# Patient Record
Sex: Male | Born: 1975 | Race: Black or African American | Hispanic: No | Marital: Married | Smoking: Never smoker
Health system: Southern US, Community
[De-identification: ages and names within clinical notes are randomized; demographics above are authoritative.]

## PROBLEM LIST (undated history)

## (undated) DIAGNOSIS — I10 Essential (primary) hypertension: Secondary | ICD-10-CM

## (undated) DIAGNOSIS — J45909 Unspecified asthma, uncomplicated: Secondary | ICD-10-CM

## (undated) DIAGNOSIS — E785 Hyperlipidemia, unspecified: Secondary | ICD-10-CM

## (undated) DIAGNOSIS — I5022 Chronic systolic (congestive) heart failure: Secondary | ICD-10-CM

## (undated) DIAGNOSIS — N183 Chronic kidney disease, stage 3 unspecified: Secondary | ICD-10-CM

## (undated) DIAGNOSIS — R7303 Prediabetes: Secondary | ICD-10-CM

## (undated) DIAGNOSIS — J309 Allergic rhinitis, unspecified: Secondary | ICD-10-CM

## (undated) DIAGNOSIS — J42 Unspecified chronic bronchitis: Secondary | ICD-10-CM

## (undated) DIAGNOSIS — Z6841 Body Mass Index (BMI) 40.0 and over, adult: Secondary | ICD-10-CM

## (undated) DIAGNOSIS — Z9889 Other specified postprocedural states: Secondary | ICD-10-CM

## (undated) DIAGNOSIS — Z8619 Personal history of other infectious and parasitic diseases: Secondary | ICD-10-CM

## (undated) DIAGNOSIS — D509 Iron deficiency anemia, unspecified: Secondary | ICD-10-CM

## (undated) DIAGNOSIS — G4730 Sleep apnea, unspecified: Secondary | ICD-10-CM

## (undated) DIAGNOSIS — F419 Anxiety disorder, unspecified: Secondary | ICD-10-CM

## (undated) DIAGNOSIS — K611 Rectal abscess: Secondary | ICD-10-CM

## (undated) DIAGNOSIS — I428 Other cardiomyopathies: Secondary | ICD-10-CM

## (undated) DIAGNOSIS — E1110 Type 2 diabetes mellitus with ketoacidosis without coma: Secondary | ICD-10-CM

## (undated) HISTORY — DX: Iron deficiency anemia, unspecified: D50.9

## (undated) HISTORY — DX: Other cardiomyopathies: I42.8

## (undated) HISTORY — DX: Type 2 diabetes mellitus with ketoacidosis without coma: E11.10

## (undated) HISTORY — DX: Unspecified asthma, uncomplicated: J45.909

## (undated) HISTORY — DX: Personal history of other infectious and parasitic diseases: Z86.19

---

## 2013-09-21 HISTORY — PX: CARDIAC CATHETERIZATION: SHX172

## 2014-01-03 ENCOUNTER — Inpatient Hospital Stay (HOSPITAL_COMMUNITY)
Admission: EM | Admit: 2014-01-03 | Discharge: 2014-01-08 | DRG: 292 | Discharge status: Against Medical Advice | Payer: Medicare Other | Attending: Cardiology | Admitting: Cardiology

## 2014-01-03 ENCOUNTER — Emergency Department (HOSPITAL_COMMUNITY): Payer: Medicare Other

## 2014-01-03 ENCOUNTER — Encounter (HOSPITAL_COMMUNITY): Payer: Self-pay | Admitting: Emergency Medicine

## 2014-01-03 DIAGNOSIS — E785 Hyperlipidemia, unspecified: Secondary | ICD-10-CM | POA: Diagnosis present

## 2014-01-03 DIAGNOSIS — I10 Essential (primary) hypertension: Secondary | ICD-10-CM | POA: Diagnosis present

## 2014-01-03 DIAGNOSIS — Z7982 Long term (current) use of aspirin: Secondary | ICD-10-CM

## 2014-01-03 DIAGNOSIS — I509 Heart failure, unspecified: Secondary | ICD-10-CM

## 2014-01-03 DIAGNOSIS — Z9119 Patient's noncompliance with other medical treatment and regimen: Secondary | ICD-10-CM

## 2014-01-03 DIAGNOSIS — Z91199 Patient's noncompliance with other medical treatment and regimen due to unspecified reason: Secondary | ICD-10-CM

## 2014-01-03 DIAGNOSIS — I2789 Other specified pulmonary heart diseases: Secondary | ICD-10-CM | POA: Diagnosis present

## 2014-01-03 DIAGNOSIS — Z6841 Body Mass Index (BMI) 40.0 and over, adult: Secondary | ICD-10-CM

## 2014-01-03 DIAGNOSIS — I129 Hypertensive chronic kidney disease with stage 1 through stage 4 chronic kidney disease, or unspecified chronic kidney disease: Secondary | ICD-10-CM | POA: Diagnosis present

## 2014-01-03 DIAGNOSIS — I5023 Acute on chronic systolic (congestive) heart failure: Principal | ICD-10-CM | POA: Diagnosis present

## 2014-01-03 DIAGNOSIS — Z79899 Other long term (current) drug therapy: Secondary | ICD-10-CM

## 2014-01-03 DIAGNOSIS — IMO0001 Reserved for inherently not codable concepts without codable children: Secondary | ICD-10-CM

## 2014-01-03 DIAGNOSIS — J45909 Unspecified asthma, uncomplicated: Secondary | ICD-10-CM | POA: Diagnosis present

## 2014-01-03 DIAGNOSIS — N189 Chronic kidney disease, unspecified: Secondary | ICD-10-CM | POA: Diagnosis present

## 2014-01-03 DIAGNOSIS — E119 Type 2 diabetes mellitus without complications: Secondary | ICD-10-CM | POA: Diagnosis present

## 2014-01-03 DIAGNOSIS — N179 Acute kidney failure, unspecified: Secondary | ICD-10-CM | POA: Diagnosis not present

## 2014-01-03 DIAGNOSIS — E876 Hypokalemia: Secondary | ICD-10-CM | POA: Diagnosis present

## 2014-01-03 DIAGNOSIS — I428 Other cardiomyopathies: Secondary | ICD-10-CM | POA: Diagnosis present

## 2014-01-03 DIAGNOSIS — G4733 Obstructive sleep apnea (adult) (pediatric): Secondary | ICD-10-CM | POA: Diagnosis present

## 2014-01-03 DIAGNOSIS — Z0389 Encounter for observation for other suspected diseases and conditions ruled out: Secondary | ICD-10-CM

## 2014-01-03 DIAGNOSIS — G4730 Sleep apnea, unspecified: Secondary | ICD-10-CM | POA: Diagnosis present

## 2014-01-03 DIAGNOSIS — D509 Iron deficiency anemia, unspecified: Secondary | ICD-10-CM | POA: Diagnosis present

## 2014-01-03 HISTORY — DX: Body Mass Index (BMI) 40.0 and over, adult: Z684

## 2014-01-03 HISTORY — DX: Morbid (severe) obesity due to excess calories: E66.01

## 2014-01-03 HISTORY — DX: Sleep apnea, unspecified: G47.30

## 2014-01-03 HISTORY — DX: Hyperlipidemia, unspecified: E78.5

## 2014-01-03 LAB — URINALYSIS, ROUTINE W REFLEX MICROSCOPIC
Glucose, UA: NEGATIVE mg/dL
Hgb urine dipstick: NEGATIVE
Ketones, ur: NEGATIVE mg/dL
Leukocytes, UA: NEGATIVE
Nitrite: NEGATIVE
Protein, ur: 100 mg/dL — AB
Specific Gravity, Urine: 1.014 (ref 1.005–1.030)
Urobilinogen, UA: 1 mg/dL (ref 0.0–1.0)
pH: 5 (ref 5.0–8.0)

## 2014-01-03 LAB — CBC WITH DIFFERENTIAL/PLATELET
Basophils Absolute: 0 10*3/uL (ref 0.0–0.1)
Basophils Relative: 1 % (ref 0–1)
EOS ABS: 0.1 10*3/uL (ref 0.0–0.7)
EOS PCT: 1 % (ref 0–5)
HCT: 28.5 % — ABNORMAL LOW (ref 39.0–52.0)
Hemoglobin: 9.3 g/dL — ABNORMAL LOW (ref 13.0–17.0)
LYMPHS ABS: 0.9 10*3/uL (ref 0.7–4.0)
LYMPHS PCT: 16 % (ref 12–46)
MCH: 25.2 pg — ABNORMAL LOW (ref 26.0–34.0)
MCHC: 32.6 g/dL (ref 30.0–36.0)
MCV: 77.2 fL — AB (ref 78.0–100.0)
Monocytes Absolute: 0.4 10*3/uL (ref 0.1–1.0)
Monocytes Relative: 8 % (ref 3–12)
NEUTROS PCT: 75 % (ref 43–77)
Neutro Abs: 4.2 10*3/uL (ref 1.7–7.7)
PLATELETS: 303 10*3/uL (ref 150–400)
RBC: 3.69 MIL/uL — AB (ref 4.22–5.81)
RDW: 18.5 % — ABNORMAL HIGH (ref 11.5–15.5)
WBC: 5.6 10*3/uL (ref 4.0–10.5)

## 2014-01-03 LAB — URINE MICROSCOPIC-ADD ON

## 2014-01-03 LAB — POCT I-STAT TROPONIN I: TROPONIN I, POC: 0.02 ng/mL (ref 0.00–0.08)

## 2014-01-03 LAB — PRO B NATRIURETIC PEPTIDE: PRO B NATRI PEPTIDE: 1164 pg/mL — AB (ref 0–125)

## 2014-01-03 LAB — BASIC METABOLIC PANEL
BUN: 22 mg/dL (ref 6–23)
CALCIUM: 8.8 mg/dL (ref 8.4–10.5)
CHLORIDE: 90 meq/L — AB (ref 96–112)
CO2: 32 mEq/L (ref 19–32)
CREATININE: 1.34 mg/dL (ref 0.50–1.35)
GFR calc Af Amer: 78 mL/min — ABNORMAL LOW (ref >90)
GFR calc non Af Amer: 67 mL/min — ABNORMAL LOW (ref >90)
Glucose, Bld: 98 mg/dL (ref 70–99)
Potassium: 2.8 mEq/L — CL (ref 3.7–5.3)
SODIUM: 134 meq/L — AB (ref 137–147)

## 2014-01-03 MED ORDER — ATORVASTATIN CALCIUM 80 MG PO TABS
80.0000 mg | ORAL_TABLET | Freq: Every day | ORAL | Status: DC
  Administered 2014-01-04 – 2014-01-08 (×5): 80 mg via ORAL
  Filled 2014-01-03 (×6): qty 1

## 2014-01-03 MED ORDER — LISINOPRIL 5 MG PO TABS
5.0000 mg | ORAL_TABLET | Freq: Every day | ORAL | Status: DC
  Administered 2014-01-04: 5 mg via ORAL
  Filled 2014-01-03 (×2): qty 1

## 2014-01-03 MED ORDER — POTASSIUM CHLORIDE CRYS ER 20 MEQ PO TBCR
40.0000 meq | EXTENDED_RELEASE_TABLET | Freq: Once | ORAL | Status: AC
  Filled 2014-01-03: qty 2

## 2014-01-03 MED ORDER — ALBUTEROL SULFATE HFA 108 (90 BASE) MCG/ACT IN AERS: 2.0000 | INHALATION_SPRAY | Freq: Four times a day (QID) | RESPIRATORY_TRACT | Status: DC | PRN

## 2014-01-03 MED ORDER — ONDANSETRON HCL 4 MG/2ML IJ SOLN
4.0000 mg | Freq: Four times a day (QID) | INTRAMUSCULAR | Status: DC | PRN
  Administered 2014-01-03: 23:00:00 4 mg via INTRAVENOUS
  Filled 2014-01-03: qty 2

## 2014-01-03 MED ORDER — ACETAMINOPHEN 325 MG PO TABS
650.0000 mg | ORAL_TABLET | ORAL | Status: DC | PRN
  Administered 2014-01-05 (×2): 650 mg via ORAL
  Filled 2014-01-03 (×2): qty 2

## 2014-01-03 MED ORDER — NITROGLYCERIN 0.4 MG SL SUBL: 0.4000 mg | SUBLINGUAL_TABLET | SUBLINGUAL | Status: DC | PRN

## 2014-01-03 MED ORDER — ALBUTEROL SULFATE (2.5 MG/3ML) 0.083% IN NEBU
2.5000 mg | INHALATION_SOLUTION | Freq: Four times a day (QID) | RESPIRATORY_TRACT | Status: DC | PRN
  Administered 2014-01-03 – 2014-01-05 (×2): 2.5 mg via RESPIRATORY_TRACT
  Filled 2014-01-03 (×2): qty 3

## 2014-01-03 MED ORDER — FUROSEMIDE 10 MG/ML IJ SOLN
80.0000 mg | Freq: Once | INTRAMUSCULAR | Status: AC
  Administered 2014-01-03: 80 mg via INTRAVENOUS
  Filled 2014-01-03: qty 8

## 2014-01-03 MED ORDER — ISOSORBIDE MONONITRATE ER 60 MG PO TB24
90.0000 mg | ORAL_TABLET | Freq: Every day | ORAL | Status: DC
  Administered 2014-01-04: 11:00:00 90 mg via ORAL
  Filled 2014-01-03 (×2): qty 1

## 2014-01-03 MED ORDER — POTASSIUM CHLORIDE CRYS ER 20 MEQ PO TBCR
40.0000 meq | EXTENDED_RELEASE_TABLET | Freq: Two times a day (BID) | ORAL | Status: DC
  Administered 2014-01-04 – 2014-01-06 (×6): 40 meq via ORAL
  Administered 2014-01-07: 20 meq via ORAL
  Administered 2014-01-07 – 2014-01-08 (×2): 40 meq via ORAL
  Filled 2014-01-03 (×14): qty 2

## 2014-01-03 MED ORDER — FUROSEMIDE 10 MG/ML IJ SOLN
80.0000 mg | Freq: Two times a day (BID) | INTRAMUSCULAR | Status: DC
  Filled 2014-01-03 (×4): qty 8

## 2014-01-03 MED ORDER — POTASSIUM CHLORIDE CRYS ER 20 MEQ PO TBCR
40.0000 meq | EXTENDED_RELEASE_TABLET | Freq: Once | ORAL | Status: AC
  Administered 2014-01-03: 40 meq via ORAL
  Filled 2014-01-03: qty 2

## 2014-01-03 MED ORDER — ASPIRIN EC 81 MG PO TBEC
81.0000 mg | DELAYED_RELEASE_TABLET | Freq: Every day | ORAL | Status: DC
Start: 2014-01-04 — End: 2014-01-08
  Administered 2014-01-04 – 2014-01-08 (×5): 81 mg via ORAL
  Filled 2014-01-03 (×5): qty 1

## 2014-01-03 MED ORDER — HYDRALAZINE HCL 50 MG PO TABS
50.0000 mg | ORAL_TABLET | Freq: Three times a day (TID) | ORAL | Status: DC
  Administered 2014-01-05: 50 mg via ORAL
  Filled 2014-01-03 (×11): qty 1

## 2014-01-03 NOTE — ED Provider Notes (Signed)
CSN: [id number]     Arrival date & time 01/03/14  1210 History   First MD Initiated Contact with Patient 01/03/14 1221     Chief Complaint  Patient presents with  . Shortness of Breath  . Chest Pain   (Consider location/radiation/quality/duration/timing/severity/associated sxs/prior Treatment) HPI Comments: She presents to the ER for evaluation of difficulty breathing. Patient reports that he has a previous history of congestive heart failure. He reports that he has an enlarged heart. He just moved here from [place], has no local records her doctors. Patient reports that for the last 2 days he has noticed increased swelling of his legs, not well enough to his abdomen. He is feeling nausea and abdominal discomfort which she has had previously with fluid overload. Patient reports increased shortness of breath and dyspnea on exertion. He has been experiencing mild pressure in the center of his chest.  Patient is a 38 y.o. male presenting with shortness of breath and chest pain.  Shortness of Breath Associated symptoms: chest pain   Chest Pain Associated symptoms: shortness of breath     Past Medical History  Diagnosis Date  . CHF (congestive heart failure)    History reviewed. No pertinent past surgical history. No family history on file. History  Substance Use Topics  . Smoking status: Never Smoker   . Smokeless tobacco: Not on file  . Alcohol Use: No    Review of Systems  Respiratory: Positive for shortness of breath.   Cardiovascular: Positive for chest pain and leg swelling.  All other systems reviewed and are negative.    Allergies  Review of patient's allergies indicates no known allergies.  Home Medications  No current outpatient prescriptions on file. BP 109/79  Pulse 106  Temp(Src) 97.6 F (36.4 C) (Oral)  Resp 22  Ht 5\' 8"  (1.727 m)  Wt 360 lb (163.295 kg)  BMI 54.75 kg/m2  SpO2 98% Physical Exam  Constitutional: He is oriented to person, place, and  time. He appears well-developed and well-nourished. No distress.  HENT:  Head: Normocephalic and atraumatic.  Right Ear: Hearing normal.  Left Ear: Hearing normal.  Nose: Nose normal.  Mouth/Throat: Oropharynx is clear and moist and mucous membranes are normal.  Eyes: Conjunctivae and EOM are normal. Pupils are equal, round, and reactive to light.  Neck: Normal range of motion. Neck supple.  Cardiovascular: Regular rhythm, S1 normal and S2 normal.  Exam reveals no gallop and no friction rub.   No murmur heard. Pulmonary/Chest: Effort normal and breath sounds normal. No respiratory distress. He exhibits no tenderness.  Pitting edema to the abdomen  Abdominal: Soft. Normal appearance and bowel sounds are normal. There is no hepatosplenomegaly. There is no tenderness. There is no rebound, no guarding, no tenderness at McBurney's point and negative Murphy's sign. No hernia.  Musculoskeletal: Normal range of motion. He exhibits edema.  Neurological: He is alert and oriented to person, place, and time. He has normal strength. No cranial nerve deficit or sensory deficit. Coordination normal. GCS eye subscore is 4. GCS verbal subscore is 5. GCS motor subscore is 6.  Skin: Skin is warm, dry and intact. No rash noted. No cyanosis.  Psychiatric: He has a normal mood and affect. His speech is normal and behavior is normal. Thought content normal.    ED Course  Procedures (including critical care time)  Angiocath insertion Performed by: POLLINA, CHRISTOPHER J.  Consent: Verbal consent obtained. Risks and benefits: risks, benefits and alternatives were discussed Time out: Immediately  prior to procedure a "time out" was called to verify the correct patient, procedure, equipment, support staff and site/side marked as required.  Preparation: Patient was prepped and draped in the usual sterile fashion.  Vein Location: right antecub  Ultrasound Guided  Gauge: 20G  Normal blood return and flush  without difficulty Patient tolerance: Patient tolerated the procedure well with no immediate complications.    Labs Review Labs Reviewed  CBC WITH DIFFERENTIAL  PRO B NATRIURETIC PEPTIDE  BASIC METABOLIC PANEL   Imaging Review No results found.  EKG Interpretation    Date/Time:  Tuesday January 03 2014 12:17:14 EST Ventricular Rate:  109 PR Interval:  166 QRS Duration: 138 QT Interval:  368 QTC Calculation: 495 R Axis:   -110 Text Interpretation:  Sinus tachycardia Right superior axis deviation Non-specific intra-ventricular conduction block Cannot rule out Anterior infarct , age undetermined Abnormal ECG No previous tracing Confirmed by POLLINA  MD, CHRISTOPHER ([id number]) on 01/03/2014 12:37:12 PM            MDM  Diagnosis: Congestive heart failure  Patient presents to the ER for evaluation of swelling and difficulty breathing. Patient reports a history of congestive heart failure. Patient is from [place], only recently moved here. No records are available. Clear what the etiology of his congestive heart failure is. He does not think he has never had an MI. He does have a history of obstructive sleep apnea.  Workup today is consistent with decompensated congestive heart failure. Patient will require hospitalization and further evaluation. Cardiology contacted to evaluate the patient for admission.    Gilda Crease, MD 01/03/14 1540

## 2014-01-03 NOTE — ED Notes (Signed)
Pt reports that recently he and his family has become homeless. Pt has wife, 36 year old and 87 year old at bedside. States they are on a waiting list for a homeless shelter. Social work paged

## 2014-01-03 NOTE — Clinical Social Work Psychosocial (Signed)
   Clinical Social Work Department BRIEF PSYCHOSOCIAL ASSESSMENT 01/03/2014  Patient:  Steven Wyatt, Steven Wyatt     Account Number:  [id number]     Admit date:  01/03/2014  Clinical Social Worker:  Cristy Folks  Date/Time:  01/03/2014 03:07 PM  Referred by:  RN  Date Referred:  01/03/2014 Referred for  Homelessness   Other Referral:   Interview type:  Family Other interview type:    PSYCHOSOCIAL DATA Living Status:  FAMILY Admitted from facility:   Level of care:   Primary support name:  Steven Wyatt Primary support relationship to patient:  SPOUSE Degree of support available:   Good support    CURRENT CONCERNS Current Concerns  Financial Resources  Other - See comment   Other Concerns:   Homelessness and Transportation    SOCIAL WORK ASSESSMENT / PLAN CSW consult for homelessness. Pt and famly present to speak with CSW. Pt and family shared  that they moved to [place], [place] from [place], [place]. They have been here for 13 days. Before the family relocated they acquired a rental property but once they arrived here the land owner had already rented the property.  Pt and family moved into an Extended Stay where they spent all of their money on the room and food. The family was applying for emergency shelter when the pt began to have chest pain and the shelter worker advised pt to come to [facility] ED. Pt is married with 2 children, 12 and 4. The family does have a monthly of $1200 from SSI. The family is willing to relocate to [place] due to more resources and services. CSW gave family name and phone number to [facility] in [place] [phone]. This service will assist families with housing. Food vouchers given. Pt.'s wife stated that they will ask friend to transport to [place] tomorrow morning. It is the plan for pt. to be admitted for observation.   Assessment/plan status:  Psychosocial Support/Ongoing Assessment of Needs Other assessment/ plan:    Information/referral to community resources:   Homeless resources    PATIENT'S/FAMILY'S RESPONSE TO PLAN OF CARE: Pt and family appreciated CSW visit, resources and information. Pt and family understood the role and will follow up as necessary.   [address], [place] [phone]

## 2014-01-03 NOTE — ED Notes (Signed)
CRITICAL VALUE ALERT  Critical value received:  K 2.8  Date of notification:  01/03/2014  Time of notification:  1412  Critical value read back:Yes  Nurse who received alert:  Christin Bach   MD notified (1st page):  Dr. Blinda Leatherwood

## 2014-01-03 NOTE — ED Notes (Signed)
Contacted Flo Manager about bed placement. Per flo, a bed should be coming available shortly.

## 2014-01-03 NOTE — H&P (Signed)
Patient ID: Steven Wyatt MRN: [id number], DOB/AGE: Mar 26, 1976   Admit date: 01/03/2014   Primary Physician: No PCP Per Patient Primary Cardiologist: Dr Shirlee Latch (new)  HPI:   38 y/o with a history of NICM- just moved here 2 weeks ago from [place]. He has had prior cardiac evaluation there in including a cath at [facility] 12/21/12, and a cath at [facility] in Oct 2014 when he went there for a second opinion. He is married and has two children. 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 He and his family had arranged to move to [facility] but when they got here they place they were going to stay was unavailable. They have been staying in an extended stay hotel. They have used up the funds they had. They went to [employer] today and he bacame SOB. He admits he has had increasing SOB and increasing weight for the past few days. He has not been taking all his medications and they have been eating out since they have been here. He tells me his "dry weight" is around 345. He says he has been as high as 430. Today his wgt is 360.    Problem List: Past Medical History  Diagnosis Date  . CHF (congestive heart failure)   . Diabetes     "borderline:  . HTN (hypertension)   . Morbid obesity with BMI of 50.0-59.9, adult   . Sleep apnea     on Bi Pap  . Dyslipidemia     History reviewed. No pertinent past surgical history.   Allergies: No Known Allergies   Home Medications No current facility-administered medications for this encounter.   Current Outpatient Prescriptions  Medication Sig Dispense Refill  . albuterol (PROVENTIL HFA;VENTOLIN HFA) 108 (90 BASE) MCG/ACT inhaler Inhale 2 puffs into the lungs every 6 (six) hours as needed for wheezing or shortness of breath.      Marland Kitchen aspirin EC 81 MG tablet Take 81 mg by mouth daily.      Marland Kitchen atorvastatin (LIPITOR) 80 MG tablet Take 80 mg by mouth daily.      .  bumetanide (BUMEX) 2 MG tablet Take 2 mg by mouth daily.      . hydrALAZINE (APRESOLINE) 100 MG tablet Take 100 mg by mouth 3 (three) times daily.      . isosorbide mononitrate (IMDUR) 30 MG 24 hr tablet Take 90 mg by mouth daily.      Marland Kitchen lisinopril (PRINIVIL,ZESTRIL) 5 MG tablet Take 5 mg by mouth daily.      . metolazone (ZAROXOLYN) 5 MG tablet Take 5 mg by mouth daily as needed ("when Bumetanide isn't working").      . potassium chloride SA (K-DUR,KLOR-CON) 20 MEQ tablet Take 20 mEq by mouth daily.         No family history on file.   History   Social History  . Marital Status: Married    Spouse Name: N/A    Number of Children: N/A  . Years of Education: N/A   Occupational History  . Not on file.   Social History Main Topics  . Smoking status: Never Smoker   . Smokeless tobacco: Not on file  . Alcohol Use: No  . Drug Use: No  . Sexual Activity: Not on file   Other Topics Concern  . Not on file   Social History Narrative  . No narrative on file  Review of Systems: General: negative for chills, fever, night sweats or weight changes.  Dermatological: negative for rash Respiratory: negative for cough or wheezing Urologic: negative for hematuria Abdominal: negative for nausea, vomiting, diarrhea, bright red blood per rectum, melena, or hematemesis Neurologic: negative for visual changes, syncope, or dizziness All other systems reviewed and are otherwise negative except as noted above.  Physical Exam: Blood pressure 124/77, pulse 98, temperature 97.6 F (36.4 C), temperature source Oral, resp. rate 18, height 5\' 8"  (1.727 m), weight 360 lb (163.295 kg), SpO2 100.00%.  General: NAD, obese, sitting up Neck: Thick, JVP 12 cm, no thyromegaly or thyroid nodule.  Lungs: Somewhat decreased breath sounds bilateral bases.  CV: Nondisplaced PMI.  Heart regular S1/S2, no S3/S4, no murmur.  1+ chronic edema to knees bilaterally.  No carotid bruit.  Unable to palpate pedal  pulses due to chronic edema.   Abdomen: Soft, nontender, no hepatosplenomegaly, no distention.  Skin: Intact without lesions or rashes.  Neurologic: Alert and oriented x 3.  Psych: Normal affect. Extremities: No clubbing or cyanosis.  HEENT: Normal.    Labs:   Results for orders placed during the hospital encounter of 01/03/14 (from the past 24 hour(s))  CBC WITH DIFFERENTIAL     Status: Abnormal   Collection Time    01/03/14 12:47 PM      Result Value Range   WBC 5.6  4.0 - 10.5 K/uL   RBC 3.69 (*) 4.22 - 5.81 MIL/uL   Hemoglobin 9.3 (*) 13.0 - 17.0 g/dL   HCT 16.128.5 (*) 09.639.0 - 04.552.0 %   MCV 77.2 (*) 78.0 - 100.0 fL   MCH 25.2 (*) 26.0 - 34.0 pg   MCHC 32.6  30.0 - 36.0 g/dL   RDW 40.918.5 (*) 81.111.5 - 91.415.5 %   Platelets 303  150 - 400 K/uL   Neutrophils Relative % 75  43 - 77 %   Neutro Abs 4.2  1.7 - 7.7 K/uL   Lymphocytes Relative 16  12 - 46 %   Lymphs Abs 0.9  0.7 - 4.0 K/uL   Monocytes Relative 8  3 - 12 %   Monocytes Absolute 0.4  0.1 - 1.0 K/uL   Eosinophils Relative 1  0 - 5 %   Eosinophils Absolute 0.1  0.0 - 0.7 K/uL   Basophils Relative 1  0 - 1 %   Basophils Absolute 0.0  0.0 - 0.1 K/uL  BASIC METABOLIC PANEL     Status: Abnormal   Collection Time    01/03/14 12:47 PM      Result Value Range   Sodium 134 (*) 137 - 147 mEq/L   Potassium 2.8 (*) 3.7 - 5.3 mEq/L   Chloride 90 (*) 96 - 112 mEq/L   CO2 32  19 - 32 mEq/L   Glucose, Bld 98  70 - 99 mg/dL   BUN 22  6 - 23 mg/dL   Creatinine, Ser 7.821.34  0.50 - 1.35 mg/dL   Calcium 8.8  8.4 - 95.610.5 mg/dL   GFR calc non Af Amer 67 (*) >90 mL/min   GFR calc Af Amer 78 (*) >90 mL/min  PRO B NATRIURETIC PEPTIDE     Status: Abnormal   Collection Time    01/03/14 12:48 PM      Result Value Range   Pro B Natriuretic peptide (BNP) 1164.0 (*) 0 - 125 pg/mL  POCT I-STAT TROPONIN I     Status: None   Collection Time  01/03/14  1:20 PM      Result Value Range   Troponin i, poc 0.02  0.00 - 0.08 ng/mL   Comment 3               Radiology/Studies: Dg Chest 2 View  01/03/2014   CLINICAL DATA:  Shortness of Breath  EXAM: CHEST  2 VIEW  COMPARISON:  None.  FINDINGS: There is moderate diffuse cardiomegaly. There is mild pulmonary venous hypertension. There is slight interstitial edema. There is no airspace consolidation. No adenopathy. No bone lesions.  IMPRESSION: Evidence of a degree of congestive heart failure. No airspace consolidation.   Electronically Signed   By: Bretta Bang M.D.   On: 01/03/2014 13:51    EKG:NSR, NSIVCD, 1st degree AVB, poor ant RW  ASSESSMENT AND PLAN:  Principal Problem:   Acute on chronic systolic CHF (congestive heart failure) Active Problems:   NICM (nonischemic cardiomyopathy)   Morbid obesity with BMI of 50.0-59.9, adult   Sleep apnea- he reports comliance with Bi Pap   Normal coronary arteries- last cath Oct 2014 at [facility]   HTN (hypertension)   Dyslipidemia   PLAN: MD to see. Admit for IV diuretics, replace K+, check echo.    Deland Pretty, PA-C 01/03/2014, 4:15 PM  Patient seen with PA, agree with the above note.  1. Acute on chronic systolic CHF: Low EF on prior echoes per patient's report.  Nonischemic cardiomyopathy.  LHC x 2 (2013 and 2014) with normal coronaries per patient.  Cause of cardiomyopathy uncertain: does have long history of HTN.  He is very volume overloaded.  He recently moved down to [place] from [place] and has been eating out in restaurants for all meals.  Very high sodium diet.  He has been taking all his medications.  Metolazone is on his medication list but he does not take it.  - Lasix 80 mg IV bid, 1st dose now.  Replace K.  - Continue home dose of lisinopril and Imdur.  Would decrease hydralazine to 50 mg tid for now with soft BP, can increase back to home dose of 100 mg tid as long as BP remains stable.  - He has not been on a beta blocker (?due to history of asthma).  Once he is diuresed, would start on bisoprolol (beta-1  selective).   - Echocardiogram: eventually may be candidate for CRT-D (QRS 138 msec with IVCD that is LBBB-like).  2. OSA: Continue home bipap.   3. HTN: BP actually on the low side currently.   Marca Ancona 01/03/2014 4:23 PM

## 2014-01-03 NOTE — ED Notes (Signed)
Pt presents to department for evaluation of SOB and chest pain. Increasing SOB x2 days. History of CHF. Also states midsternal chest pressure. Speaking complete sentences upon arrival. Respirations unlabored. Skin warm and dry. Pt is alert and oriented x4.

## 2014-01-03 NOTE — ED Notes (Signed)
Cardiology at bedside.

## 2014-01-04 DIAGNOSIS — D509 Iron deficiency anemia, unspecified: Secondary | ICD-10-CM | POA: Diagnosis present

## 2014-01-04 DIAGNOSIS — I059 Rheumatic mitral valve disease, unspecified: Secondary | ICD-10-CM

## 2014-01-04 DIAGNOSIS — E876 Hypokalemia: Secondary | ICD-10-CM | POA: Diagnosis present

## 2014-01-04 LAB — BASIC METABOLIC PANEL
BUN: 26 mg/dL — ABNORMAL HIGH (ref 6–23)
BUN: 25 mg/dL — AB (ref 6–23)
CHLORIDE: 92 meq/L — AB (ref 96–112)
CO2: 24 mEq/L (ref 19–32)
CO2: 28 meq/L (ref 19–32)
Calcium: 8.7 mg/dL (ref 8.4–10.5)
Calcium: 8.4 mg/dL (ref 8.4–10.5)
Chloride: 90 mEq/L — ABNORMAL LOW (ref 96–112)
Creatinine, Ser: 1.42 mg/dL — ABNORMAL HIGH (ref 0.50–1.35)
Creatinine, Ser: 1.19 mg/dL (ref 0.50–1.35)
GFR calc Af Amer: 73 mL/min — ABNORMAL LOW (ref >90)
GFR calc Af Amer: >90 mL/min (ref >90)
GFR calc non Af Amer: 63 mL/min — ABNORMAL LOW (ref >90)
GFR calc non Af Amer: 78 mL/min — ABNORMAL LOW (ref >90)
GLUCOSE: 137 mg/dL — AB (ref 70–99)
Glucose, Bld: 112 mg/dL — ABNORMAL HIGH (ref 70–99)
Potassium: 3.9 mEq/L (ref 3.7–5.3)
Potassium: 3.7 mEq/L (ref 3.7–5.3)
Sodium: 134 mEq/L — ABNORMAL LOW (ref 137–147)
Sodium: 134 mEq/L — ABNORMAL LOW (ref 137–147)

## 2014-01-04 LAB — GLUCOSE, CAPILLARY
Glucose-Capillary: 121 mg/dL — ABNORMAL HIGH (ref 70–99)
Glucose-Capillary: 183 mg/dL — ABNORMAL HIGH (ref 70–99)

## 2014-01-04 LAB — MAGNESIUM: Magnesium: 1.6 mg/dL (ref 1.5–2.5)

## 2014-01-04 MED ORDER — FUROSEMIDE 10 MG/ML IJ SOLN
15.0000 mg/h | INTRAVENOUS | Status: DC
  Administered 2014-01-04: 10 mg/h via INTRAVENOUS
  Administered 2014-01-05 (×2): 15 mg/h via INTRAVENOUS
  Administered 2014-01-05: 10 mg/h via INTRAVENOUS
  Administered 2014-01-06 – 2014-01-07 (×2): 15 mg/h via INTRAVENOUS
  Filled 2014-01-04 (×11): qty 25

## 2014-01-04 MED ORDER — SPIRONOLACTONE 12.5 MG HALF TABLET
12.5000 mg | ORAL_TABLET | Freq: Every day | ORAL | Status: DC
  Administered 2014-01-05 – 2014-01-08 (×4): 12.5 mg via ORAL
  Filled 2014-01-04 (×6): qty 1

## 2014-01-04 MED ORDER — MAGNESIUM SULFATE 40 MG/ML IJ SOLN
2.0000 g | Freq: Once | INTRAMUSCULAR | Status: AC
  Administered 2014-01-04: 11:00:00 2 g via INTRAVENOUS
  Filled 2014-01-04: qty 50

## 2014-01-04 MED ORDER — FUROSEMIDE 10 MG/ML IJ SOLN: 80.0000 mg | Freq: Three times a day (TID) | INTRAMUSCULAR | Status: DC

## 2014-01-04 MED ORDER — HEPARIN SODIUM (PORCINE) 5000 UNIT/ML IJ SOLN
5000.0000 [IU] | Freq: Three times a day (TID) | INTRAMUSCULAR | Status: DC
  Administered 2014-01-05 – 2014-01-08 (×11): 5000 [IU] via SUBCUTANEOUS
  Filled 2014-01-04 (×15): qty 1

## 2014-01-04 MED ORDER — FUROSEMIDE 10 MG/ML IJ SOLN
40.0000 mg | Freq: Once | INTRAMUSCULAR | Status: AC
  Administered 2014-01-04: 40 mg via INTRAVENOUS

## 2014-01-04 NOTE — Progress Notes (Signed)
Pt a/o, no c/o pain, pt oob ad lib, c/o DOE, pt put on 2L for comfort, O2 sats WNL, pt started on lasix gtt @ 10 ml/hr, pt stable, vss

## 2014-01-04 NOTE — Progress Notes (Signed)
Patient ID: Steven Wyatt, male   DOB: [birth year], 38 y.o.   MRN: [id number]   SUBJECTIVE: Patient only got 1 dose IV Lasix in the ER yesterday.  Diuresis does not seem to have been particularly vigorous.    Marland Kitchen. aspirin EC  81 mg Oral Daily  . atorvastatin  80 mg Oral Daily  . furosemide  40 mg Intravenous Once  . hydrALAZINE  50 mg Oral Q8H  . isosorbide mononitrate  90 mg Oral Daily  . lisinopril  5 mg Oral Daily  . magnesium sulfate 1 - 4 g bolus IVPB  2 g Intravenous Once  . potassium chloride  40 mEq Oral BID  . spironolactone  12.5 mg Oral Daily      Filed Vitals:   01/03/14 2044 01/03/14 2052 01/04/14 0100 01/04/14 0435  BP:  132/86 114/61 110/85  Pulse:  110 105 99  Temp: 98 F (36.7 C) 98 F (36.7 C)  97.8 F (36.6 C)  TempSrc:  Oral  Oral  Resp:  18  20  Height:  5\' 8"  (1.727 m)    Weight:  181.303 kg (399 lb 11.2 oz)  180.4 kg (397 lb 11.4 oz)  SpO2: 99% 99%  100%    Intake/Output Summary (Last 24 hours) at 01/04/14 0813 Last data filed at 01/04/14 0437  Gross per 24 hour  Intake    480 ml  Output    625 ml  Net   -145 ml    LABS: Basic Metabolic Panel:  Recent Labs  40/98/1101/13/15 1247 01/04/14 0320  NA 134* 134*  K 2.8* 3.9  CL 90* 90*  CO2 32 24  GLUCOSE 98 112*  BUN 22 26*  CREATININE 1.34 1.42*  CALCIUM 8.8 8.7  MG  --  1.6   Liver Function Tests: No results found for this basename: AST, ALT, ALKPHOS, BILITOT, PROT, ALBUMIN,  in the last 72 hours No results found for this basename: LIPASE, AMYLASE,  in the last 72 hours CBC:  Recent Labs  01/03/14 1247  WBC 5.6  NEUTROABS 4.2  HGB 9.3*  HCT 28.5*  MCV 77.2*  PLT 303   Cardiac Enzymes: No results found for this basename: CKTOTAL, CKMB, CKMBINDEX, TROPONINI,  in the last 72 hours BNP: No components found with this basename: POCBNP,  D-Dimer: No results found for this basename: DDIMER,  in the last 72 hours Hemoglobin A1C: No results found for this basename: HGBA1C,  in the  last 72 hours Fasting Lipid Panel: No results found for this basename: CHOL, HDL, LDLCALC, TRIG, CHOLHDL, LDLDIRECT,  in the last 72 hours Thyroid Function Tests: No results found for this basename: TSH, T4TOTAL, FREET3, T3FREE, THYROIDAB,  in the last 72 hours Anemia Panel: No results found for this basename: VITAMINB12, FOLATE, FERRITIN, TIBC, IRON, RETICCTPCT,  in the last 72 hours  RADIOLOGY: Dg Chest 2 View  01/03/2014   CLINICAL DATA:  Shortness of Breath  EXAM: CHEST  2 VIEW  COMPARISON:  None.  FINDINGS: There is moderate diffuse cardiomegaly. There is mild pulmonary venous hypertension. There is slight interstitial edema. There is no airspace consolidation. No adenopathy. No bone lesions.  IMPRESSION: Evidence of a degree of congestive heart failure. No airspace consolidation.   Electronically Signed   By: Bretta BangWilliam  Woodruff M.D.   On: 01/03/2014 13:51    PHYSICAL EXAM General: NAD, obese, sitting up  Neck: Thick, JVP 12 cm, no thyromegaly or thyroid nodule.  Lungs: Somewhat decreased breath sounds bilateral bases.  CV: Nondisplaced PMI. Heart regular S1/S2, no S3/S4, no murmur. 1+ chronic edema to knees bilaterally. No carotid bruit. Unable to palpate pedal pulses due to chronic edema.  Abdomen: Soft, nontender, no hepatosplenomegaly, no distention.  Skin: Intact without lesions or rashes.  Neurologic: Alert and oriented x 3.  Psych: Normal affect.  Extremities: No clubbing or cyanosis.   TELEMETRY: Reviewed telemetry pt in NSR  ASSESSMENT AND PLAN: 38 yo with history of chronic systolic CHF (nonischemic CMP) and morbid obesity presented with acute on chronic systolic CHF.  1. Acute on chronic systolic CHF: Low EF on prior echoes per patient's report. Nonischemic cardiomyopathy. LHC x 2 (2013 and 2014) with normal coronaries per patient. Cause of cardiomyopathy uncertain: does have long history of HTN. He remains very volume overloaded. He recently moved down to [place] from  [place] and has been eating out in restaurants for all meals. Very high sodium diet. He has been taking all his medications. Metolazone is on his medication list but he does not take it.  - He tells me that Lasix gtt has worked best for diuresis on prior hospital admissions.  I will give him a bolus of Lasix 40 mg IV then start gtt at 10 mg/hr.  BMET to be repeated in pm.  - Continue current lisinopril, hydralazine/Imdur.  - Will add spironolactone 12.5 mg daily.   - He has not been on a beta blocker (?due to history of asthma). Once he is diuresed, would start on bisoprolol (beta-1 selective).  - Echocardiogram: eventually may be candidate for CRT-D (QRS 138 msec with IVCD that is LBBB-like).  2. OSA: Continue home bipap.  3. HTN: BP not elevated.   Marca Ancona 01/04/2014 8:17 AM

## 2014-01-04 NOTE — Progress Notes (Addendum)
Clinical Child psychotherapist (CSW) called on call chaplain to see if patient's family could get to [facility]. Chaplain reported that it could not be tonight but they would work on it first thing in the morning.   Jetta Lout, LCSWA Weekend CSW [phone]  CSW received call from [place] social worker at [employer]. Alinda Money is working on getting the family housing.  Tonya's number is [phone].  Jetta Lout, LCSWA Weekend CSW [phone]

## 2014-01-04 NOTE — Progress Notes (Signed)
  Echocardiogram 2D Echocardiogram has been performed.  Cathie Beams 01/04/2014, 9:51 AM

## 2014-01-04 NOTE — Care Management Note (Addendum)
  Page 1 of 1   01/04/2014     5:18:14 PM   CARE MANAGEMENT NOTE 01/04/2014  Patient:  Steven Wyatt, Steven Wyatt   Account Number:  [id number]  Date Initiated:  01/04/2014  Documentation initiated by:  Donato Schultz  Subjective/Objective Assessment:   Admitted with CHF     Action/Plan:   CM will monitor for dispositon needs   Anticipated DC Date:  01/07/2014   Anticipated DC Plan:    In-house referral  Clinical Social Worker         Choice offered to / List presented to:             Status of service:   Medicare Important Message given?   (If response is "NO", the following Medicare IM given date fields will be blank) Date Medicare IM given:   Date Additional Medicare IM given:    Discharge Disposition:  HOME/SELF CARE  Per UR Regulation:  Reviewed for med. necessity/level of care/duration of stay  If discussed at Long Length of Stay Meetings, dates discussed:    Comments:  01/04/2014 Last UR Date 01/04/2014 Social:  From DC: homeless in [place] with wife and children. (SW Referral) Disposition Plan:  Pending. Donato Schultz, RN, BSN, CCM 01/04/2014

## 2014-01-05 DIAGNOSIS — Z6841 Body Mass Index (BMI) 40.0 and over, adult: Secondary | ICD-10-CM

## 2014-01-05 LAB — CARBOXYHEMOGLOBIN
Carboxyhemoglobin: 1.4 % (ref 0.5–1.5)
Carboxyhemoglobin: 1.4 % (ref 0.5–1.5)
Methemoglobin: 1.6 % — ABNORMAL HIGH (ref 0.0–1.5)
Methemoglobin: 0.7 % (ref 0.0–1.5)
O2 Saturation: 34.6 %
O2 Saturation: 47.2 %
Total hemoglobin: 9.4 g/dL — ABNORMAL LOW (ref 13.5–18.0)
Total hemoglobin: 8.9 g/dL — ABNORMAL LOW (ref 13.5–18.0)

## 2014-01-05 LAB — BASIC METABOLIC PANEL
BUN: 29 mg/dL — ABNORMAL HIGH (ref 6–23)
CHLORIDE: 90 meq/L — AB (ref 96–112)
CO2: 24 meq/L (ref 19–32)
CREATININE: 1.43 mg/dL — AB (ref 0.50–1.35)
Calcium: 8.2 mg/dL — ABNORMAL LOW (ref 8.4–10.5)
GFR calc non Af Amer: 62 mL/min — ABNORMAL LOW (ref >90)
GFR, EST AFRICAN AMERICAN: 72 mL/min — AB (ref >90)
GLUCOSE: 161 mg/dL — AB (ref 70–99)
POTASSIUM: 3.6 meq/L — AB (ref 3.7–5.3)
Sodium: 133 mEq/L — ABNORMAL LOW (ref 137–147)

## 2014-01-05 LAB — MRSA PCR SCREENING: MRSA by PCR: NEGATIVE

## 2014-01-05 MED ORDER — SODIUM CHLORIDE 0.9 % IJ SOLN: 10.0000 mL | INTRAMUSCULAR | Status: DC | PRN

## 2014-01-05 MED ORDER — MILRINONE IN DEXTROSE 20 MG/100ML IV SOLN
0.1250 ug/kg/min | INTRAVENOUS | Status: DC
  Administered 2014-01-05 – 2014-01-06 (×3): 0.25 ug/kg/min via INTRAVENOUS
  Administered 2014-01-06 (×3): 0.375 ug/kg/min via INTRAVENOUS
  Administered 2014-01-06 – 2014-01-07 (×2): 0.25 ug/kg/min via INTRAVENOUS
  Administered 2014-01-07 (×2): 0.375 ug/kg/min via INTRAVENOUS
  Filled 2014-01-05 (×4): qty 100
  Filled 2014-01-05: qty 200
  Filled 2014-01-05 (×4): qty 100

## 2014-01-05 MED ORDER — TRAMADOL HCL 50 MG PO TABS
50.0000 mg | ORAL_TABLET | Freq: Once | ORAL | Status: AC
  Administered 2014-01-05: 12:00:00 50 mg via ORAL
  Filled 2014-01-05: qty 1

## 2014-01-05 MED ORDER — METOLAZONE 5 MG PO TABS
5.0000 mg | ORAL_TABLET | Freq: Once | ORAL | Status: AC
  Administered 2014-01-05: 09:00:00 5 mg via ORAL
  Filled 2014-01-05: qty 1

## 2014-01-05 MED ORDER — POTASSIUM CHLORIDE CRYS ER 20 MEQ PO TBCR
40.0000 meq | EXTENDED_RELEASE_TABLET | Freq: Once | ORAL | Status: AC
  Administered 2014-01-05: 09:00:00 40 meq via ORAL

## 2014-01-05 MED ORDER — LISINOPRIL 2.5 MG PO TABS
2.5000 mg | ORAL_TABLET | Freq: Every day | ORAL | Status: DC
Start: 2014-01-05 — End: 2014-01-06
  Administered 2014-01-05: 11:00:00 2.5 mg via ORAL
  Filled 2014-01-05 (×2): qty 1

## 2014-01-05 NOTE — Progress Notes (Signed)
Pt. Alert and oriented. C/o fullness and tightness to abdominal area. Pt. Requested PRN pain medication. Med administered as ordered. Pt. On IV lasix drip. Pt. Stated he feels he is not putting out as much urine as he should be on drip. Total amount of output charted. Pt. Bladder scanned and 122 mL of urine noted in bladder on scan. On call MD made aware. Orders received to in/out cath patient. Pt. Stated he would try to use the urinal instead. Pt. Able to urinate 200cc. RN will continue to monitor pt. For changes in condition. Konya Fauble, Cheryll Dessert

## 2014-01-05 NOTE — Progress Notes (Signed)
  CO-OX on Milrinone 0.25 mcg improved   34>47%  Continue current plan.   Rachelle Edwards NP-C 3:12 PM

## 2014-01-05 NOTE — Progress Notes (Signed)
Patient ID: Steven Wyatt, male   DOB: 09-27-1976, 38 y.o.   MRN: [id number]    SUBJECTIVE: Patient did not diurese vigorously on Lasix gtt yesterday.  No dyspnea at rest.   BP is soft.   Marland Kitchen aspirin EC  81 mg Oral Daily  . atorvastatin  80 mg Oral Daily  . heparin  5,000 Units Subcutaneous Q8H  . lisinopril  2.5 mg Oral Daily  . metolazone  5 mg Oral Once  . potassium chloride  40 mEq Oral BID  . potassium chloride  40 mEq Oral Once  . spironolactone  12.5 mg Oral Daily  Lasix gtt @ 10 mg/hr   Filed Vitals:   01/04/14 1945 01/04/14 2218 01/05/14 0203 01/05/14 0620  BP:  105/73 124/86 99/57  Pulse: 88 99 93 95  Temp:  97.3 F (36.3 C)  97.6 F (36.4 C)  TempSrc:  Oral  Oral  Resp: 20 20  22   Height:      Weight:    183.8 kg (405 lb 3.3 oz)  SpO2: 100% 100%  100%    Intake/Output Summary (Last 24 hours) at 01/05/14 0749 Last data filed at 01/05/14 6144  Gross per 24 hour  Intake    480 ml  Output   1575 ml  Net  -1095 ml    LABS: Basic Metabolic Panel:  Recent Labs  31/54/00 1247 01/04/14 0320 01/04/14 1545 01/05/14 0523  NA 134* 134* 134* 133*  K 2.8* 3.9 3.7 3.6*  CL 90* 90* 92* 90*  CO2 32 24 28 24   GLUCOSE 98 112* 137* 161*  BUN 22 26* 25* 29*  CREATININE 1.34 1.42* 1.19 1.43*  CALCIUM 8.8 8.7 8.4 8.2*  MG  --  1.6  --   --    Liver Function Tests: No results found for this basename: AST, ALT, ALKPHOS, BILITOT, PROT, ALBUMIN,  in the last 72 hours No results found for this basename: LIPASE, AMYLASE,  in the last 72 hours CBC:  Recent Labs  01/03/14 1247  WBC 5.6  NEUTROABS 4.2  HGB 9.3*  HCT 28.5*  MCV 77.2*  PLT 303   Cardiac Enzymes: No results found for this basename: CKTOTAL, CKMB, CKMBINDEX, TROPONINI,  in the last 72 hours BNP: No components found with this basename: POCBNP,  D-Dimer: No results found for this basename: DDIMER,  in the last 72 hours Hemoglobin A1C: No results found for this basename: HGBA1C,  in the last 72  hours Fasting Lipid Panel: No results found for this basename: CHOL, HDL, LDLCALC, TRIG, CHOLHDL, LDLDIRECT,  in the last 72 hours Thyroid Function Tests: No results found for this basename: TSH, T4TOTAL, FREET3, T3FREE, THYROIDAB,  in the last 72 hours Anemia Panel: No results found for this basename: VITAMINB12, FOLATE, FERRITIN, TIBC, IRON, RETICCTPCT,  in the last 72 hours  RADIOLOGY: Dg Chest 2 View  01/03/2014   CLINICAL DATA:  Shortness of Breath  EXAM: CHEST  2 VIEW  COMPARISON:  None.  FINDINGS: There is moderate diffuse cardiomegaly. There is mild pulmonary venous hypertension. There is slight interstitial edema. There is no airspace consolidation. No adenopathy. No bone lesions.  IMPRESSION: Evidence of a degree of congestive heart failure. No airspace consolidation.   Electronically Signed   By: Bretta Bang M.D.   On: 01/03/2014 13:51    PHYSICAL EXAM General: NAD, obese, sitting up  Neck: Thick, JVP 12 cm, no thyromegaly or thyroid nodule.  Lungs: Somewhat decreased breath sounds bilateral bases.  CV: Nondisplaced PMI. Heart regular S1/S2, no S3/S4, no murmur. 1+ chronic edema to knees bilaterally. No carotid bruit. Unable to palpate pedal pulses due to chronic edema.  Abdomen: Soft, nontender, no hepatosplenomegaly, no distention.  Skin: Intact without lesions or rashes.  Neurologic: Alert and oriented x 3.  Psych: Normal affect.  Extremities: No clubbing or cyanosis.   TELEMETRY: Reviewed telemetry pt in NSR  ASSESSMENT AND PLAN: 38 yo with history of chronic systolic CHF (nonischemic CMP) and morbid obesity presented with acute on chronic systolic CHF.  1. Acute on chronic systolic CHF: Echo yesterday with EF 20%, diffuse hypokinesis, RV moderately dysfunctional. Nonischemic cardiomyopathy. LHC x 2 (2013 and 2014) with normal coronaries per patient. Cause of cardiomyopathy uncertain: does have long history of HTN. He remains very volume overloaded. He recently moved  down to [place] from [place] and has been eating out in restaurants for all meals. Very high sodium diet. He has been taking all his medications.  - Soft BP and poor diuresis with Lasix gtt.  I am going to start him on milrinone @ 0.25 mcg/kg/min as I suspect low output is making diuresis difficult.  - Hold hydralazine/Imdur for now, decrease lisinopril to 2.5 daily. Can continue spironolactone.  - Increase Lasix gtt to 15 mg/hr and give metolazine 5 mg po x 1.    - He has not been on a beta blocker (?due to history of asthma). Once he is diuresed, would start on bisoprolol (beta-1 selective).  - Will place PICC.  -Eventually may be candidate for CRT-D (QRS 138 msec with IVCD that is LBBB-like).  2. OSA: Continue home bipap.  3. CKD: Creatinine essentially stable.  Suspect cardiorenal component.   Marca AnconaDalton Maxamillion Banas 01/05/2014 7:49 AM

## 2014-01-05 NOTE — Progress Notes (Signed)
Pt agreed to transfer to [facility] after talking with nurse about family being at bedside. Pt transferred by bed, NAD noted, able to communicate needs.

## 2014-01-05 NOTE — Progress Notes (Signed)
Peripherally Inserted Central Catheter/Midline Placement  The IV Nurse has discussed with the patient and/or persons authorized to consent for the patient, the purpose of this procedure and the potential benefits and risks involved with this procedure.  The benefits include less needle sticks, lab draws from the catheter and patient may be discharged home with the catheter.  Risks include, but not limited to, infection, bleeding, blood clot (thrombus formation), and puncture of an artery; nerve damage and irregular heat beat.  Alternatives to this procedure were also discussed.  PICC/Midline Placement Documentation        Vevelyn Pat 01/05/2014, 10:52 AM

## 2014-01-05 NOTE — Progress Notes (Signed)
Late Entry.  CSW and student- Donnella Sham met with patient this morning in attempt to provide psychosocial support and assistance with homelessness issues for his family.  Within a few minutes of talking to patient he became visibly agitated and began yelling. "I"ve already told my story to 3 other social workers, case Teaching laboratory technician and nurses- don't you people communicate?  I'm a Education officer, museum myself- an LCSW and so I know how things should be done." CSW explained that there were attempts being made to assist patient and his family with their homeless situation and that we simply needed to get an update and talk about possible options. CSW verbalized concerns that his children have been staying with patient and his wife in the hospital and that it was already explained to him that the hospital was asking that children not visit due to flu issues in the hospital. Again- patient responded angrily "I've already signed a waiver about this that says that I won't sue you if they get sick- this has already been dealt with- so why are you in here talking about it again. If my wife and kids cannot stay with me- just discharge me to the streets and my wife will sue all of you when I die!"  CSW attempted to provide calming support to patient without much success.  CSW left the room with promise to return soon and spoke with Scot Dock, Mudlogger of 3E and Freida Busman- Copy to update on above.  After multiple calls, CSW was able to obtain approval from Freda Munro in [facility] for patient's wife and 36 year old son to have a room at the [facility]. (Patient relates that his 64 year old son is now staying with his mother in [place]). This would not be a charge for this family and the room was approved for 1 week.   CSW met with patient and his wife today on 2 H after patient was moved to this unit and introduced him to the CSW covering this unit- Ky Barban. We discussed the [facility] arrangement and the  benefits of this option.  Patient was noted to be very calm during this visit and politely declined this offer stating that he felt that he needed his wife and son to remain close by. "We don't really know too many people here and it's a trust issue".  Patient and wife related that they had already discussed with patient's nurse that wife and son could sleep in the waiting room- thus being close by patient.  Mrs. Wolf stated that she had spent the morning at [facility] and working with Kenney Houseman with [employer] in an effort to make arrangements for a living situation. They are hopeful to have something worked out by either the end of the week of first of next week.  CSW strongly encouraged this family to reconsider [facility] but they defer at this time and want to see how the next few days progress with the they help of [employer].  Patient stated that they had been staying at the Extended Stay but their money has run out for rent or food.  Alden Server will continue to follow up with this family; Cleopatra Cedar, Surveyor, quantity of Social Work was also notified of above.  Lorie Phenix. [place], [place]

## 2014-01-05 NOTE — Progress Notes (Signed)
Chaplain followed up on referral from on call chaplain.  Chaplain observed pt on the phone.  Chaplain greeted pt and invited pt to let chaplain know if pt needs chaplain's assistance.  Chaplain will follow up as needed.   01/05/14 1300  Clinical Encounter Type  Visited With Patient and family together  Visit Type Spiritual support;Social support  Referral From [facility]  Spiritual Encounters  Spiritual Needs Emotional   Rulon Abide, chaplain, [phone]

## 2014-01-05 NOTE — Progress Notes (Signed)
Pt will place on machine when ready. NO distress noted at this time. Pt encouraged to call RT if needing any assistance.

## 2014-01-06 LAB — BASIC METABOLIC PANEL
BUN: 33 mg/dL — AB (ref 6–23)
CO2: 31 mEq/L (ref 19–32)
CREATININE: 1.55 mg/dL — AB (ref 0.50–1.35)
Calcium: 8.4 mg/dL (ref 8.4–10.5)
Chloride: 88 mEq/L — ABNORMAL LOW (ref 96–112)
GFR calc Af Amer: 65 mL/min — ABNORMAL LOW (ref >90)
GFR, EST NON AFRICAN AMERICAN: 56 mL/min — AB (ref >90)
Glucose, Bld: 129 mg/dL — ABNORMAL HIGH (ref 70–99)
Potassium: 3.6 mEq/L — ABNORMAL LOW (ref 3.7–5.3)
Sodium: 131 mEq/L — ABNORMAL LOW (ref 137–147)

## 2014-01-06 LAB — MAGNESIUM: Magnesium: 1.6 mg/dL (ref 1.5–2.5)

## 2014-01-06 LAB — CARBOXYHEMOGLOBIN
Carboxyhemoglobin: 1.5 % (ref 0.5–1.5)
Carboxyhemoglobin: 1.7 % — ABNORMAL HIGH (ref 0.5–1.5)
METHEMOGLOBIN: 0.9 % (ref 0.0–1.5)
Methemoglobin: 0.7 % (ref 0.0–1.5)
O2 SAT: 51.9 %
O2 Saturation: 67.3 %
TOTAL HEMOGLOBIN: 9.7 g/dL — AB (ref 13.5–18.0)
Total hemoglobin: 9.2 g/dL — ABNORMAL LOW (ref 13.5–18.0)

## 2014-01-06 MED ORDER — MAGNESIUM SULFATE 40 MG/ML IJ SOLN
2.0000 g | Freq: Once | INTRAMUSCULAR | Status: AC
  Administered 2014-01-06: 2 g via INTRAVENOUS
  Filled 2014-01-06: qty 50

## 2014-01-06 MED ORDER — SODIUM CHLORIDE 0.9 % IV SOLN
INTRAVENOUS | Status: DC
  Administered 2014-01-06: via INTRAVENOUS

## 2014-01-06 MED ORDER — POTASSIUM CHLORIDE CRYS ER 20 MEQ PO TBCR
40.0000 meq | EXTENDED_RELEASE_TABLET | Freq: Once | ORAL | Status: AC
  Administered 2014-01-06: 40 meq via ORAL

## 2014-01-06 NOTE — Progress Notes (Signed)
Pt. States he can place cpap on himself. RT informed pt. To notify if he needs any assistance. 

## 2014-01-06 NOTE — Progress Notes (Addendum)
CSW received call from pt requesting meal vouchers for tonight. CSW explained that we do not have the resources to continue providing these to family, that they got them the first two nights the pt has been in the hospital, and that CSW explained this yesterday. Pt asked to speak with CSW supervisor. CSW explained that she was walking by supervisor's office and would hand him the phone so pt could speak with him. Supervisor was not in his office, so CSW explained to pt that CSW would call her supervisor and let him know that pt would like to speak with supervisor. CSW called supervisor and left a voicemail explaining that pt wants to speak with him about meal vouchers, recounting CSW's conversation with pt.  Addendum (LATE ENTRY): CSW spoke with supervisor around 5:15pm, who explained that at CSW discretion a dinner voucher can be provided to family; however, we must ensure that the pt's family members have no other way of getting food as this is not a sustainable, long-term solution. Supervisor asked CSW to speak with pt's wife about the voucher and determine that she does not have alternative means of obtaining food for herself and their son. CSW called on-call CSW, as this CSW had left for the weekend, and explained this to on-call CSW. CSW asked on-call CSW to bring a voucher to pt's room and to further assess their progress on finding housing and a longer-term option for obtaining food. CSW will continue to follow case and provide support as needed and assistance to family.  Maryclare Labrador, MSW, [facility] Clinical Social Worker [phone]

## 2014-01-06 NOTE — Progress Notes (Signed)
LATE ENTRY  This CSW was introduced to pt and wife yesterday afternoon. Pt requested meal vouchers, and CSW explained that resources are not available to continue giving family meal vouchers and that they have received them for two nights previously. Pt and wife expressed understanding of this. Pt/family offered a stay at the [facility], as family recently moved here and do not have housing. Further, pt states he has a young son (approx. 18 years old) who he states must be in close proximity to him while he is in the hospital. Family declined [facility] offer. CSW asked if pt/family have additional questions for CSW, and they state they do not at this time. CSW continues to follow case.   Maryclare Labrador, MSW, [facility] Clinical Social Worker [phone]

## 2014-01-06 NOTE — Progress Notes (Addendum)
Patient ID: Huey RomansWayne O Wetherbee, male   DOB: [birth year], 38 y.o.   MRN: [id number]   SUBJECTIVE: Good diuresis yesterday after starting milrinone.  CVP still 24 this morning.   Marland Kitchen. aspirin EC  81 mg Oral Daily  . atorvastatin  80 mg Oral Daily  . heparin  5,000 Units Subcutaneous Q8H  . magnesium sulfate 1 - 4 g bolus IVPB  2 g Intravenous Once  . potassium chloride  40 mEq Oral BID  . potassium chloride  40 mEq Oral Once  . spironolactone  12.5 mg Oral Daily  Lasix gtt @ 15 mg/hr Milrinone gtt @ 0.25   Filed Vitals:   01/05/14 2200 01/05/14 2352 01/06/14 0000 01/06/14 0400  BP: 107/87  112/76 103/70  Pulse: 89  100   Temp:  98.2 F (36.8 C)  98.3 F (36.8 C)  TempSrc:  Oral  Oral  Resp: 16  24 16   Height:      Weight:    184 kg (405 lb 10.3 oz)  SpO2: 100%  100% 100%    Intake/Output Summary (Last 24 hours) at 01/06/14 0745 Last data filed at 01/06/14 0600  Gross per 24 hour  Intake 868.35 ml  Output   4720 ml  Net -3851.65 ml    LABS: Basic Metabolic Panel:  Recent Labs  40/98/1101/14/15 0320  01/05/14 0523 01/06/14 0345  NA 134*  < > 133* 131*  K 3.9  < > 3.6* 3.6*  CL 90*  < > 90* 88*  CO2 24  < > 24 31  GLUCOSE 112*  < > 161* 129*  BUN 26*  < > 29* 33*  CREATININE 1.42*  < > 1.43* 1.55*  CALCIUM 8.7  < > 8.2* 8.4  MG 1.6  --   --  1.6  < > = values in this interval not displayed. Liver Function Tests: No results found for this basename: AST, ALT, ALKPHOS, BILITOT, PROT, ALBUMIN,  in the last 72 hours No results found for this basename: LIPASE, AMYLASE,  in the last 72 hours CBC:  Recent Labs  01/03/14 1247  WBC 5.6  NEUTROABS 4.2  HGB 9.3*  HCT 28.5*  MCV 77.2*  PLT 303   Cardiac Enzymes: No results found for this basename: CKTOTAL, CKMB, CKMBINDEX, TROPONINI,  in the last 72 hours BNP: No components found with this basename: POCBNP,  D-Dimer: No results found for this basename: DDIMER,  in the last 72 hours Hemoglobin A1C: No results found for this  basename: HGBA1C,  in the last 72 hours Fasting Lipid Panel: No results found for this basename: CHOL, HDL, LDLCALC, TRIG, CHOLHDL, LDLDIRECT,  in the last 72 hours Thyroid Function Tests: No results found for this basename: TSH, T4TOTAL, FREET3, T3FREE, THYROIDAB,  in the last 72 hours Anemia Panel: No results found for this basename: VITAMINB12, FOLATE, FERRITIN, TIBC, IRON, RETICCTPCT,  in the last 72 hours  RADIOLOGY: Dg Chest 2 View  01/03/2014   CLINICAL DATA:  Shortness of Breath  EXAM: CHEST  2 VIEW  COMPARISON:  None.  FINDINGS: There is moderate diffuse cardiomegaly. There is mild pulmonary venous hypertension. There is slight interstitial edema. There is no airspace consolidation. No adenopathy. No bone lesions.  IMPRESSION: Evidence of a degree of congestive heart failure. No airspace consolidation.   Electronically Signed   By: Bretta BangWilliam  Woodruff M.D.   On: 01/03/2014 13:51    PHYSICAL EXAM General: NAD, obese, sitting up  Neck: Thick, JVP 12+ cm, no thyromegaly  or thyroid nodule.  Lungs: Somewhat decreased breath sounds bilateral bases.  CV: Nondisplaced PMI. Heart regular S1/S2, no S3/S4, no murmur. 1+ chronic edema to knees bilaterally. No carotid bruit. Unable to palpate pedal pulses due to chronic edema.  Abdomen: Soft, nontender, no hepatosplenomegaly, no distention.  Skin: Intact without lesions or rashes.  Neurologic: Alert and oriented x 3.  Psych: Normal affect.  Extremities: No clubbing or cyanosis.   TELEMETRY: Reviewed telemetry pt in NSR  ASSESSMENT AND PLAN: 38 yo with history of chronic systolic CHF (nonischemic CMP) and morbid obesity presented with acute on chronic systolic CHF.  1. Acute on chronic systolic CHF: Echo with EF 20%, diffuse hypokinesis, RV moderately dysfunctional. Nonischemic cardiomyopathy. LHC x 2 (2013 and 2014) with normal coronaries per patient. Cause of cardiomyopathy uncertain: does have long history of HTN. He remains very volume  overloaded. He recently moved down to [place] from [place] and had been eating out in restaurants for all meals. Very high sodium diet. He had been taking all his medications.  - Diuresis better with initiation of milrinone. Co-ox still low at 52%.  Will increase milrinone to 0.375 this morning and repeat co-ox.  - Hold hydralazine/Imdur and lisinopril for now with BP on the lower side. Can continue spironolactone.  - Continue Lasix gtt @ 15 mg/hr, good diuresis yesterday.  CVP remains high at 24.    - Eventually may be candidate for CRT-D (QRS 138 msec with IVCD that is LBBB-like).  2. OSA: Continue home bipap.  3. CKD: Mild rise in creatinine, hopefully will improve with milrinone. Suspect cardiorenal component.   Marca Ancona 01/06/2014 7:45 AM   Repeat co-ox 67%.  He seems to be diuresing well.  Continue current regimen.  He did not get metolazone today as he seems to be diuresing well without.  Can give dose tomorrow if UOP not adequate.  Marca Ancona 01/06/2014 5:10 PM

## 2014-01-06 NOTE — Progress Notes (Signed)
Chaplain responded to referral concerning food vouchers. However, when chaplain entered and asked the family about their needs, they said they were doing fine. Chaplain will follow up as necessary.

## 2014-01-07 ENCOUNTER — Encounter (HOSPITAL_COMMUNITY): Payer: Self-pay

## 2014-01-07 DIAGNOSIS — I428 Other cardiomyopathies: Secondary | ICD-10-CM

## 2014-01-07 LAB — CARBOXYHEMOGLOBIN
CARBOXYHEMOGLOBIN: 1.7 % — AB (ref 0.5–1.5)
METHEMOGLOBIN: 0.6 % (ref 0.0–1.5)
O2 Saturation: 61.6 %
Total hemoglobin: 9.3 g/dL — ABNORMAL LOW (ref 13.5–18.0)

## 2014-01-07 MED ORDER — BUMETANIDE 2 MG PO TABS
4.0000 mg | ORAL_TABLET | Freq: Every day | ORAL | Status: DC
  Administered 2014-01-07 – 2014-01-08 (×2): 4 mg via ORAL
  Filled 2014-01-07 (×3): qty 2

## 2014-01-07 NOTE — Progress Notes (Signed)
Pt. Wearing cpap. With 3L 02 bleed in. Tolerating well.

## 2014-01-07 NOTE — Progress Notes (Signed)
Patient found laying in bed eating family size bag of salted potato chips. RN attempted to educate patient on compliance and effects of chips on heart failure and diuresing. Patient stated chips and salt have no effect on heart failure and became very agitated and verbally hostile. Patient stated "this has been a beautiful stay with my family until now. I know my body and my heart is just fine". Patient unwilling to have further conversation/education r/t heart failure. Will notify MD upon rounds r/t noncompliance and lack of willingness to be educated.

## 2014-01-07 NOTE — Progress Notes (Signed)
  Subjective:  Substantially improved after prodigious diuresis. States that as long as he can have access to meds his CHF is always well controlled. Insists that he has to leave soon to sign a lease, as his current lack of a home is the actual cause of his decompensation. Clearly noncompliant with dietary restrictions - eating chips and cheese burger in the room, refusing hospital tray, despite our staff's admonitions.  Objective:  Temp:  [97.3 F (36.3 C)-98.9 F (37.2 C)] 98.5 F (36.9 C) (01/17 1550) Pulse Rate:  [101-116] 116 (01/17 1200) Resp:  [18-26] 20 (01/17 1550) BP: (93-130)/(60-89) 120/60 mmHg (01/17 1550) SpO2:  [87 %-100 %] 90 % (01/17 1550) Weight:  [179 kg (394 lb 10 oz)] 179 kg (394 lb 10 oz) (01/17 0500) Weight change: -5 kg (-11 lb 0.4 oz)  Intake/Output from previous day: 01/16 0701 - 01/17 0700 In: 1764.2 [P.O.:720; I.V.:1044.2] Out: 7730 [Urine:7730]  Intake/Output from this shift: Total I/O In: 1627.4 [P.O.:1200; I.V.:427.4] Out: 2850 [Urine:2850]  Medications: Current Facility-Administered Medications  Medication Dose Route Frequency Provider Last Rate Last Dose  . 0.9 %  sodium chloride infusion   Intravenous Continuous Dalton S McLean, MD 10 mL/hr at 01/06/14 0009    . acetaminophen (TYLENOL) tablet 650 mg  650 mg Oral Q4H PRN Luke K Kilroy, PA-C   650 mg at 01/05/14 1114  . albuterol (PROVENTIL) (2.5 MG/3ML) 0.083% nebulizer solution 2.5 mg  2.5 mg Nebulization Q6H PRN Dalton S McLean, MD   2.5 mg at 01/05/14 0831  . aspirin EC tablet 81 mg  81 mg Oral Daily Luke K Kilroy, PA-C   81 mg at 01/07/14 0927  . atorvastatin (LIPITOR) tablet 80 mg  80 mg Oral Daily Luke K Kilroy, PA-C   80 mg at 01/07/14 1116  . heparin injection 5,000 Units  5,000 Units Subcutaneous Q8H Dalton S McLean, MD   5,000 Units at 01/07/14 1406  . milrinone (PRIMACOR) infusion 200 mcg/mL (0.2 mg/ml)  0.125 mcg/kg/min Intravenous Continuous Mihai Croitoru, MD 6.9 mL/hr at  01/07/14 1838 0.125 mcg/kg/min at 01/07/14 1838  . nitroGLYCERIN (NITROSTAT) SL tablet 0.4 mg  0.4 mg Sublingual Q5 min PRN Luke K Kilroy, PA-C      . ondansetron (ZOFRAN) injection 4 mg  4 mg Intravenous Q6H PRN Luke K Kilroy, PA-C   4 mg at 01/03/14 2255  . potassium chloride SA (K-DUR,KLOR-CON) CR tablet 40 mEq  40 mEq Oral BID Luke K Kilroy, PA-C   20 mEq at 01/07/14 0927  . sodium chloride 0.9 % injection 10-40 mL  10-40 mL Intracatheter PRN Dalton S McLean, MD      . spironolactone (ALDACTONE) tablet 12.5 mg  12.5 mg Oral Daily Dalton S McLean, MD   12.5 mg at 01/07/14 0927    Physical Exam: General appearance: alert, cooperative, no distress and morbidly obese Neck: no adenopathy, no carotid bruit, supple, symmetrical, trachea midline and cannot see JVP Lungs: clear to auscultation bilaterally Heart: regular rate and rhythm, S1, S2 normal and frequent ectopy Abdomen: soft, non-tender; bowel sounds normal; no masses,  no organomegaly Extremities: edema 2+ Pulses: 2+ and symmetric Skin: Skin color, texture, turgor normal. No rashes or lesions Neurologic: Grossly normal  Lab Results: Results for orders placed during the hospital encounter of 01/03/14 (from the past 48 hour(s))  MAGNESIUM     Status: None   Collection Time    01/06/14  3:45 AM      Result Value Range   Magnesium   1.6  1.5 - 2.5 mg/dL  BASIC METABOLIC PANEL     Status: Abnormal   Collection Time    01/06/14  3:45 AM      Result Value Range   Sodium 131 (*) 137 - 147 mEq/L   Potassium 3.6 (*) 3.7 - 5.3 mEq/L   Chloride 88 (*) 96 - 112 mEq/L   CO2 31  19 - 32 mEq/L   Glucose, Bld 129 (*) 70 - 99 mg/dL   BUN 33 (*) 6 - 23 mg/dL   Creatinine, Ser 1.55 (*) 0.50 - 1.35 mg/dL   Calcium 8.4  8.4 - 10.5 mg/dL   GFR calc non Af Amer 56 (*) >90 mL/min   GFR calc Af Amer 65 (*) >90 mL/min   Comment: (NOTE)     The eGFR has been calculated using the CKD EPI equation.     This calculation has not been validated in all  clinical situations.     eGFR's persistently <90 mL/min signify possible Chronic Kidney     Disease.  CARBOXYHEMOGLOBIN     Status: Abnormal   Collection Time    01/06/14  3:50 AM      Result Value Range   Total hemoglobin 9.2 (*) 13.5 - 18.0 g/dL   O2 Saturation 51.9     Carboxyhemoglobin 1.5  0.5 - 1.5 %   Methemoglobin 0.9  0.0 - 1.5 %  CARBOXYHEMOGLOBIN     Status: Abnormal   Collection Time    01/06/14 12:00 PM      Result Value Range   Total hemoglobin 9.7 (*) 13.5 - 18.0 g/dL   O2 Saturation 67.3     Carboxyhemoglobin 1.7 (*) 0.5 - 1.5 %   Methemoglobin 0.7  0.0 - 1.5 %  CARBOXYHEMOGLOBIN     Status: Abnormal   Collection Time    01/07/14  3:47 AM      Result Value Range   Total hemoglobin 9.3 (*) 13.5 - 18.0 g/dL   O2 Saturation 61.6     Carboxyhemoglobin 1.7 (*) 0.5 - 1.5 %   Methemoglobin 0.6  0.0 - 1.5 %    Imaging: Imaging results have been reviewed and No results found.  Assessment:  1. Principal Problem: 2.   Acute on chronic systolic CHF (congestive heart failure) 3. Active Problems: 4.   NICM (nonischemic cardiomyopathy) 5.   Normal coronary arteries- last cath Oct 2014 at [place] 6.   Morbid obesity with BMI of 50.0-59.9, adult 7.   Sleep apnea- he reports comliance with Bi Pap 8.   HTN (hypertension) 9.   Dyslipidemia 10.   Asthma 11.   Microcytic anemia 12.   Hypokalemia 13.   Plan:  1. Improving acute on chronic cardiac failure  - wean off inotropes and switch to oral diuretics 2. He would benefit from another 48 hours of transition to PO meds but he insists on leaving tomorrow, even if against medical advice 3. recheck labs in AM 4. Will not readily accept advice re: low sodium diet -"he knows his body better than anybody else)  Time Spent Directly with Patient:  45 minutes  Length of Stay:  LOS: 4 days    CROITORU,MIHAI 01/07/2014, 6:41 PM     

## 2014-01-07 NOTE — Plan of Care (Signed)
Problem: Phase II Progression Outcomes Goal: Begin discharge teaching Outcome: Not Progressing Patient observed being noncompliant with diet. RN attempted to educate patient on the importance of a low sodium, heart healthy diet and patient became verbally hostile stating "Salt doesn't affect my heart. My heart is just fine"

## 2014-01-07 NOTE — Progress Notes (Signed)
RN received call from Dietary supervisor. Patient has been calling Service Response requesting food from the cafeteria for himself and wife and child. Patient to be receiving prescribed diet, and wife and child to receive patient trays from cafeteria. RN will attempt to discuss diet orders and curtisy tray with patient and request that he not continue to call service response for multiple orders.

## 2014-01-08 LAB — BASIC METABOLIC PANEL
BUN: 33 mg/dL — AB (ref 6–23)
CO2: 34 mEq/L — ABNORMAL HIGH (ref 19–32)
CREATININE: 1.56 mg/dL — AB (ref 0.50–1.35)
Calcium: 9.1 mg/dL (ref 8.4–10.5)
Chloride: 89 mEq/L — ABNORMAL LOW (ref 96–112)
GFR, EST AFRICAN AMERICAN: 65 mL/min — AB (ref >90)
GFR, EST NON AFRICAN AMERICAN: 56 mL/min — AB (ref >90)
Glucose, Bld: 109 mg/dL — ABNORMAL HIGH (ref 70–99)
Potassium: 4 mEq/L (ref 3.7–5.3)
Sodium: 136 mEq/L — ABNORMAL LOW (ref 137–147)

## 2014-01-08 LAB — CARBOXYHEMOGLOBIN
CARBOXYHEMOGLOBIN: 1.5 % (ref 0.5–1.5)
METHEMOGLOBIN: 1.6 % — AB (ref 0.0–1.5)
O2 Saturation: 35.3 %
Total hemoglobin: 9.6 g/dL — ABNORMAL LOW (ref 13.5–18.0)

## 2014-01-08 LAB — MAGNESIUM: Magnesium: 1.7 mg/dL (ref 1.5–2.5)

## 2014-01-08 MED ORDER — LORAZEPAM 2 MG/ML IJ SOLN
1.0000 mg | Freq: Once | INTRAMUSCULAR | Status: AC
Start: 2014-01-08 — End: 2014-01-08
  Administered 2014-01-08: 1 mg via INTRAVENOUS
  Filled 2014-01-08: qty 1

## 2014-01-08 MED ORDER — DIPHENHYDRAMINE HCL 25 MG PO CAPS
50.0000 mg | ORAL_CAPSULE | Freq: Once | ORAL | Status: AC
  Administered 2014-01-08: 50 mg via ORAL
  Filled 2014-01-08: qty 2

## 2014-01-08 MED ORDER — ALBUTEROL SULFATE HFA 108 (90 BASE) MCG/ACT IN AERS: 2.0000 | INHALATION_SPRAY | Freq: Four times a day (QID) | RESPIRATORY_TRACT | Status: DC | PRN

## 2014-01-08 MED ORDER — HYDRALAZINE HCL 10 MG PO TABS: 25.0000 mg | ORAL_TABLET | Freq: Three times a day (TID) | ORAL | Status: DC

## 2014-01-08 MED ORDER — ATORVASTATIN CALCIUM 80 MG PO TABS: 80.0000 mg | ORAL_TABLET | Freq: Every day | ORAL | Status: DC

## 2014-01-08 MED ORDER — BUMETANIDE 2 MG PO TABS: 4.0000 mg | ORAL_TABLET | Freq: Every day | ORAL | Status: DC

## 2014-01-08 MED ORDER — ISOSORBIDE MONONITRATE ER 30 MG PO TB24: 90.0000 mg | ORAL_TABLET | Freq: Every day | ORAL | Status: DC

## 2014-01-08 NOTE — Progress Notes (Signed)
I agree with the assessment documented by Mavis, student nurse. 

## 2014-01-08 NOTE — Discharge Summary (Signed)
Physician Discharge Summary  Patient ID: Steven Wyatt MRN: [id number] DOB/AGE: 1976/04/18 37 y.o. Cardiologist: Shirlee Latch  Admit date: 01/03/2014 Discharge date: 01/08/2014  Admission Diagnoses:    Acute on chronic systolic CHF (congestive heart failure)  Discharge Diagnoses:  Principal Problem:   Acute on chronic systolic CHF (congestive heart failure) Active Problems:   NICM (nonischemic cardiomyopathy)   Normal coronary arteries- last cath Oct 2014 at [facility]   Morbid obesity with BMI of 50.0-59.9, adult   Sleep apnea- he reports comliance with Bi Pap   HTN (hypertension)   Dyslipidemia   Asthma   Microcytic anemia   Hypokalemia   Acute renal failure.    Discharged Condition: stable  Hospital Course:  38 y/o with a history of NICM- just moved here 2 weeks ago from [place]. He has had prior cardiac evaluation there including a cath at [facility] 12/21/12, and a cath at [facility] in Oct 2014 when he went there for a second opinion. He is married and has two children. 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 He and his family had arranged to move to [facility] but when they got here they place they were going to stay was unavailable. They have been staying in an extended stay hotel. They have used up the funds they had. They went to [employer] today and he bacame SOB. He admits he has had increasing SOB and increasing weight for the past few days. He has not been taking all his medications and they have been eating out since they have been here. He tells me his "dry weight" is around 345. He says he has been as high as 430. Today his wgt is 360.   The patient was admitted for IV diuresis with lasix gtt which resulted in -13.2L of fluid loss.  He was also started on milrinone.  Home bipap used.  His weight decreased from 405 to 368.  He says this is his dry weight, however, he reported it at  admission as 345#.Marland Kitchen  The patient has been very noncompliant and was found eating a large family size bag of salted potato chips in his room.  The patient stated,"Salt doesn't affect my heart. My heart is just fine."  He became verbally abusive to the nurse when she tried to educate him.   2D echo revealed EF of 20%, diffuse hypokinesis, grade 2 diastolic dysfunction, mod pulm HTN, mod to sev LAE.  He was weaned of inotropes and changed to PO diuretics.   SCr increased to 1.55/56.  He still needs further observation and monitored diuretic therapy, however, the patient said he is leaving against medical advise.  The patient was seen by Dr. Royann Shivers who felt he was NOT ready for DC home.      Consults: None  Significant Diagnostic Studies:   Echo Study Conclusions  - Left ventricle: The cavity size was moderately dilated. Wall thickness was normal. The estimated ejection fraction was 20%. Diffuse hypokinesis. Features are consistent with a pseudonormal left ventricular filling pattern, with concomitant abnormal relaxation and increased filling pressure (grade 2 diastolic dysfunction). - Aortic valve: There was no stenosis. - Mitral valve: Mildly calcified annulus. Mildly calcified leaflets . Mild regurgitation. - Left atrium: The atrium was moderately to severely dilated. - Right ventricle: The cavity size was moderately dilated. Systolic function was moderately reduced. - Tricuspid valve: Peak RV-RA gradient: 39mm Hg (S). - Pulmonary arteries:  PA peak pressure: 2mm Hg (S). - Systemic veins: IVC measured 3.2 cm with minimal respirophasic variation, suggesting RA pressure 20 mmHg. Impressions:  - Moderately dilated LV with severe global hypokinesis, EF 20%. Moderate diastolic dysfunction. Moderately dilated RV with moderately decreased systolic function. Mild MR. Moderate pulmonary hypertension. Moderate to severe LAE.  01/03/14 EXAM: CHEST 2 VIEW  COMPARISON:  None.  FINDINGS: There is moderate diffuse cardiomegaly. There is mild pulmonary venous hypertension. There is slight interstitial edema. There is no airspace consolidation. No adenopathy. No bone lesions.  IMPRESSION: Evidence of a degree of congestive heart failure. No airspace consolidation.   Electronically Signed By: Bretta Bang M.D. On: 01/03/2014 13:51    CBC    Component Value Date/Time   WBC 5.6 01/03/2014 1247   RBC 3.69* 01/03/2014 1247   HGB 9.3* 01/03/2014 1247   HCT 28.5* 01/03/2014 1247   PLT 303 01/03/2014 1247   MCV 77.2* 01/03/2014 1247   MCH 25.2* 01/03/2014 1247   MCHC 32.6 01/03/2014 1247   RDW 18.5* 01/03/2014 1247   LYMPHSABS 0.9 01/03/2014 1247   MONOABS 0.4 01/03/2014 1247   EOSABS 0.1 01/03/2014 1247   BASOSABS 0.0 01/03/2014 1247    BMET    Component Value Date/Time   NA 136* 01/08/2014 0500   K 4.0 01/08/2014 0500   CL 89* 01/08/2014 0500   CO2 34* 01/08/2014 0500   GLUCOSE 109* 01/08/2014 0500   BUN 33* 01/08/2014 0500   CREATININE 1.56* 01/08/2014 0500   CALCIUM 9.1 01/08/2014 0500   GFRNONAA 56* 01/08/2014 0500   GFRAA 65* 01/08/2014 0500      Treatments: See above  Discharge Exam: Blood pressure 103/41, pulse 110, temperature 98.1 F (36.7 C), temperature source Oral, resp. rate 18, height 5\' 8"  (1.727 m), weight 368 lb 13.3 oz (167.3 kg), SpO2 97.00%.   Disposition: Final discharge disposition not confirmed      Discharge Orders   Future Orders Complete By Expires   Diet - low sodium heart healthy  As directed    Discharge instructions  As directed    Comments:     Call the office if you gain 2 pound in 24hrs or 5 pounds in a week.   Increase activity slowly  As directed        Medication List    STOP taking these medications       lisinopril 5 MG tablet  Commonly known as:  PRINIVIL,ZESTRIL      TAKE these medications       albuterol 108 (90 BASE) MCG/ACT inhaler  Commonly known as:  PROVENTIL HFA;VENTOLIN HFA   Inhale 2 puffs into the lungs every 6 (six) hours as needed for wheezing or shortness of breath.     aspirin EC 81 MG tablet  Take 81 mg by mouth daily.     atorvastatin 80 MG tablet  Commonly known as:  LIPITOR  Take 80 mg by mouth daily.     bumetanide 2 MG tablet  Commonly known as:  BUMEX  Take 2 tablets (4 mg total) by mouth daily with breakfast.     hydrALAZINE 10 MG tablet  Commonly known as:  APRESOLINE  Take 2.5 tablets (25 mg total) by mouth 3 (three) times daily.     isosorbide mononitrate 30 MG 24 hr tablet  Commonly known as:  IMDUR  Take 90 mg by mouth daily.     metolazone 5 MG tablet  Commonly known as:  ZAROXOLYN  Take 5 mg  by mouth daily as needed ("when Bumetanide isn't working").     potassium chloride SA 20 MEQ tablet  Commonly known as:  K-DUR,KLOR-CON  Take 20 mEq by mouth daily.       Follow-up Information   Follow up with Marca Anconaalton McLean, MD. (The office will call you regarding the follow up appt. date and time.)    Specialty:  Cardiology   Contact information:   [address] [place] [id number] [phone]       Signed: Wilburt FinlayHAGER, BRYAN 01/08/2014, 11:33 AM   Unfortunately, noncompliance with medical recommendations and his insistence on being discharged today will adversely impact his prognosis. A few more days would have been needed to gradually resume his PO CHF meds. Discussed need for daily weights, sodium restriction and early follow up  Thurmon FairMihai Aleeah Greeno, MD, [facility] [phone] office [phone] pager

## 2014-01-08 NOTE — Progress Notes (Signed)
Pt. Places himself on cpap. No issues at this time. Pt. Has 3L oxygen titrated into the cpap.

## 2014-01-08 NOTE — Plan of Care (Signed)
Problem: Phase III Progression Outcomes Goal: Dyspnea controlled with activity Outcome: Not Met (add Reason) Back to baseline per patient     

## 2014-01-08 NOTE — Progress Notes (Signed)
Dr. Verdie Mosher notified of 8 beat run of Vtach.  Patient asymptomatic. NAD.  Will continue to monitor.

## 2014-01-08 NOTE — Progress Notes (Signed)
Discharge paperwork complete. RN attempted to review Discharge AVS with patient. Patient stated "Im just ready to go". RN clarified need for all scripts and notified PA who sent scripts to [facility]. Patient attempted to do CHF education, but patient stated "I know what I need to do".  Lt upper are picc line site dressing dry and intact. RN instructed patient on care and to hold pressure if site began to bleed. Patient noncompliant with bedrest post line removal.

## 2014-01-08 NOTE — Progress Notes (Signed)
Order received for patient discharge. RN notified patient. Awaiting for paperwork completion and for IV therapy to come and remove picc line. Patient encouraged to remain on cardiac monitor until official discharge is done. Patient refused. CCMD notified.

## 2014-01-09 ENCOUNTER — Telehealth: Payer: Self-pay | Admitting: Nurse Practitioner

## 2014-01-09 NOTE — Telephone Encounter (Signed)
Spoke with patient and he was discharged yesterday from the hospital. States he had and episode last night with anxiety but otherwise he is doing good. Up and around without any shortness of breath. Aware of follow up appointment on Friday. Medications reviewed with the patient and has all of them but the Hydralazine and Atorvastatin. He could not afford his medication when he went to pick it up but is trying to come up with the money. He did check his blood pressure this am and 110/78. He has no questions and will call back if anything comes up before his appointment.

## 2014-01-09 NOTE — Telephone Encounter (Signed)
New problem   Pt has 7day TCM on 01/13/14 @ 11:30 w/ Lawson Fiscal per after hours vm.

## 2014-01-11 ENCOUNTER — Encounter (HOSPITAL_COMMUNITY): Payer: Self-pay | Admitting: Emergency Medicine

## 2014-01-11 ENCOUNTER — Emergency Department (HOSPITAL_COMMUNITY): Payer: Medicare Other

## 2014-01-11 ENCOUNTER — Other Ambulatory Visit: Payer: Self-pay

## 2014-01-11 ENCOUNTER — Inpatient Hospital Stay (HOSPITAL_COMMUNITY)
Admission: EM | Admit: 2014-01-11 | Discharge: 2014-01-16 | DRG: 291 | Disposition: A | Discharge status: Home Health | Payer: Medicare Other | Attending: Cardiology | Admitting: Cardiology

## 2014-01-11 DIAGNOSIS — I2789 Other specified pulmonary heart diseases: Secondary | ICD-10-CM | POA: Diagnosis present

## 2014-01-11 DIAGNOSIS — N183 Chronic kidney disease, stage 3 unspecified: Secondary | ICD-10-CM | POA: Diagnosis present

## 2014-01-11 DIAGNOSIS — G4733 Obstructive sleep apnea (adult) (pediatric): Secondary | ICD-10-CM | POA: Diagnosis present

## 2014-01-11 DIAGNOSIS — Y92009 Unspecified place in unspecified non-institutional (private) residence as the place of occurrence of the external cause: Secondary | ICD-10-CM

## 2014-01-11 DIAGNOSIS — N179 Acute kidney failure, unspecified: Secondary | ICD-10-CM | POA: Diagnosis present

## 2014-01-11 DIAGNOSIS — E119 Type 2 diabetes mellitus without complications: Secondary | ICD-10-CM | POA: Diagnosis present

## 2014-01-11 DIAGNOSIS — I447 Left bundle-branch block, unspecified: Secondary | ICD-10-CM | POA: Diagnosis present

## 2014-01-11 DIAGNOSIS — Z91013 Allergy to seafood: Secondary | ICD-10-CM

## 2014-01-11 DIAGNOSIS — N189 Chronic kidney disease, unspecified: Principal | ICD-10-CM

## 2014-01-11 DIAGNOSIS — I130 Hypertensive heart and chronic kidney disease with heart failure and stage 1 through stage 4 chronic kidney disease, or unspecified chronic kidney disease: Principal | ICD-10-CM | POA: Diagnosis present

## 2014-01-11 DIAGNOSIS — Z79899 Other long term (current) drug therapy: Secondary | ICD-10-CM

## 2014-01-11 DIAGNOSIS — Z7982 Long term (current) use of aspirin: Secondary | ICD-10-CM

## 2014-01-11 DIAGNOSIS — R55 Syncope and collapse: Secondary | ICD-10-CM

## 2014-01-11 DIAGNOSIS — I428 Other cardiomyopathies: Secondary | ICD-10-CM | POA: Diagnosis present

## 2014-01-11 DIAGNOSIS — Z6841 Body Mass Index (BMI) 40.0 and over, adult: Secondary | ICD-10-CM

## 2014-01-11 DIAGNOSIS — S43016A Anterior dislocation of unspecified humerus, initial encounter: Secondary | ICD-10-CM | POA: Diagnosis present

## 2014-01-11 DIAGNOSIS — E785 Hyperlipidemia, unspecified: Secondary | ICD-10-CM | POA: Diagnosis present

## 2014-01-11 DIAGNOSIS — W19XXXA Unspecified fall, initial encounter: Secondary | ICD-10-CM | POA: Diagnosis present

## 2014-01-11 DIAGNOSIS — I509 Heart failure, unspecified: Secondary | ICD-10-CM | POA: Diagnosis present

## 2014-01-11 DIAGNOSIS — I5023 Acute on chronic systolic (congestive) heart failure: Secondary | ICD-10-CM | POA: Diagnosis present

## 2014-01-11 DIAGNOSIS — S43002A Unspecified subluxation of left shoulder joint, initial encounter: Secondary | ICD-10-CM

## 2014-01-11 DIAGNOSIS — E662 Morbid (severe) obesity with alveolar hypoventilation: Secondary | ICD-10-CM | POA: Diagnosis present

## 2014-01-11 LAB — COMPREHENSIVE METABOLIC PANEL
ALBUMIN: 3.8 g/dL (ref 3.5–5.2)
ALK PHOS: 173 U/L — AB (ref 39–117)
ALT: 40 U/L (ref 0–53)
AST: 41 U/L — ABNORMAL HIGH (ref 0–37)
BUN: 42 mg/dL — AB (ref 6–23)
CALCIUM: 9.3 mg/dL (ref 8.4–10.5)
CO2: 24 mEq/L (ref 19–32)
Chloride: 84 mEq/L — ABNORMAL LOW (ref 96–112)
Creatinine, Ser: 2.02 mg/dL — ABNORMAL HIGH (ref 0.50–1.35)
GFR calc Af Amer: 48 mL/min — ABNORMAL LOW (ref >90)
GFR calc non Af Amer: 41 mL/min — ABNORMAL LOW (ref >90)
Glucose, Bld: 135 mg/dL — ABNORMAL HIGH (ref 70–99)
POTASSIUM: 4.3 meq/L (ref 3.7–5.3)
Sodium: 132 mEq/L — ABNORMAL LOW (ref 137–147)
Total Bilirubin: 2.4 mg/dL — ABNORMAL HIGH (ref 0.3–1.2)
Total Protein: 8.7 g/dL — ABNORMAL HIGH (ref 6.0–8.3)

## 2014-01-11 LAB — CBC
HCT: 28.9 % — ABNORMAL LOW (ref 39.0–52.0)
Hemoglobin: 9.7 g/dL — ABNORMAL LOW (ref 13.0–17.0)
MCH: 25.6 pg — AB (ref 26.0–34.0)
MCHC: 33.6 g/dL (ref 30.0–36.0)
MCV: 76.3 fL — ABNORMAL LOW (ref 78.0–100.0)
Platelets: 327 10*3/uL (ref 150–400)
RBC: 3.79 MIL/uL — ABNORMAL LOW (ref 4.22–5.81)
RDW: 18 % — AB (ref 11.5–15.5)
WBC: 7 10*3/uL (ref 4.0–10.5)

## 2014-01-11 LAB — POCT I-STAT TROPONIN I: TROPONIN I, POC: 0.02 ng/mL (ref 0.00–0.08)

## 2014-01-11 LAB — PRO B NATRIURETIC PEPTIDE: Pro B Natriuretic peptide (BNP): 2156 pg/mL — ABNORMAL HIGH (ref 0–125)

## 2014-01-11 MED ORDER — SODIUM CHLORIDE 0.9 % IJ SOLN
3.0000 mL | Freq: Two times a day (BID) | INTRAMUSCULAR | Status: DC
  Administered 2014-01-11 – 2014-01-16 (×6): 3 mL via INTRAVENOUS

## 2014-01-11 MED ORDER — KETAMINE HCL 10 MG/ML IJ SOLN
0.5000 mg/kg | Freq: Once | INTRAMUSCULAR | Status: AC
  Administered 2014-01-11: 40 mg via INTRAVENOUS

## 2014-01-11 MED ORDER — FUROSEMIDE 10 MG/ML IJ SOLN
40.0000 mg | Freq: Once | INTRAMUSCULAR | Status: AC
  Administered 2014-01-11: 40 mg via INTRAVENOUS
  Filled 2014-01-11: qty 4

## 2014-01-11 MED ORDER — POTASSIUM CHLORIDE CRYS ER 20 MEQ PO TBCR
20.0000 meq | EXTENDED_RELEASE_TABLET | Freq: Every day | ORAL | Status: DC
  Administered 2014-01-12: 20 meq via ORAL
  Filled 2014-01-11 (×3): qty 1

## 2014-01-11 MED ORDER — SODIUM CHLORIDE 0.9 % IV SOLN
250.0000 mL | INTRAVENOUS | Status: DC | PRN
  Administered 2014-01-14: 250 mL via INTRAVENOUS

## 2014-01-11 MED ORDER — FENTANYL CITRATE 0.05 MG/ML IJ SOLN
INTRAMUSCULAR | Status: AC
  Administered 2014-01-11: 50 ug via INTRAVENOUS
  Filled 2014-01-11: qty 2

## 2014-01-11 MED ORDER — ATORVASTATIN CALCIUM 80 MG PO TABS
80.0000 mg | ORAL_TABLET | Freq: Every day | ORAL | Status: DC
  Administered 2014-01-12 – 2014-01-15 (×4): 80 mg via ORAL
  Filled 2014-01-11 (×5): qty 1

## 2014-01-11 MED ORDER — MILRINONE IN DEXTROSE 20 MG/100ML IV SOLN
0.3750 ug/kg/min | INTRAVENOUS | Status: DC
  Administered 2014-01-11 – 2014-01-13 (×5): 0.25 ug/kg/min via INTRAVENOUS
  Administered 2014-01-13 – 2014-01-16 (×11): 0.375 ug/kg/min via INTRAVENOUS
  Filled 2014-01-11 (×19): qty 100

## 2014-01-11 MED ORDER — ACETAMINOPHEN 325 MG PO TABS: 650.0000 mg | ORAL_TABLET | ORAL | Status: DC | PRN

## 2014-01-11 MED ORDER — FENTANYL CITRATE 0.05 MG/ML IJ SOLN
50.0000 ug | Freq: Once | INTRAMUSCULAR | Status: AC
  Administered 2014-01-11: 50 ug via INTRAVENOUS
  Filled 2014-01-11 (×2): qty 2

## 2014-01-11 MED ORDER — HYDRALAZINE HCL 25 MG PO TABS
12.5000 mg | ORAL_TABLET | Freq: Four times a day (QID) | ORAL | Status: DC
  Administered 2014-01-11 – 2014-01-13 (×7): 12.5 mg via ORAL
  Filled 2014-01-11 (×11): qty 0.5

## 2014-01-11 MED ORDER — ASPIRIN EC 81 MG PO TBEC
81.0000 mg | DELAYED_RELEASE_TABLET | Freq: Every day | ORAL | Status: DC
  Administered 2014-01-11 – 2014-01-16 (×6): 81 mg via ORAL
  Filled 2014-01-11 (×6): qty 1

## 2014-01-11 MED ORDER — FENTANYL CITRATE 0.05 MG/ML IJ SOLN
50.0000 ug | Freq: Once | INTRAMUSCULAR | Status: AC
  Administered 2014-01-11: 50 ug via INTRAVENOUS

## 2014-01-11 MED ORDER — FUROSEMIDE 10 MG/ML IJ SOLN
80.0000 mg | Freq: Four times a day (QID) | INTRAMUSCULAR | Status: DC
  Administered 2014-01-11 – 2014-01-12 (×2): 80 mg via INTRAVENOUS
  Filled 2014-01-11 (×4): qty 8

## 2014-01-11 MED ORDER — ONDANSETRON HCL 4 MG/2ML IJ SOLN
4.0000 mg | Freq: Once | INTRAMUSCULAR | Status: AC
  Administered 2014-01-11: 4 mg via INTRAVENOUS
  Filled 2014-01-11: qty 2

## 2014-01-11 MED ORDER — SODIUM CHLORIDE 0.9 % IJ SOLN: 3.0000 mL | INTRAMUSCULAR | Status: DC | PRN

## 2014-01-11 MED ORDER — ISOSORBIDE MONONITRATE ER 30 MG PO TB24
30.0000 mg | ORAL_TABLET | Freq: Every day | ORAL | Status: DC
  Administered 2014-01-11 – 2014-01-16 (×6): 30 mg via ORAL
  Filled 2014-01-11 (×6): qty 1

## 2014-01-11 MED ORDER — ONDANSETRON HCL 4 MG/2ML IJ SOLN
4.0000 mg | Freq: Four times a day (QID) | INTRAMUSCULAR | Status: DC | PRN
  Administered 2014-01-11: 4 mg via INTRAVENOUS
  Filled 2014-01-11: qty 2

## 2014-01-11 MED ORDER — MORPHINE SULFATE 4 MG/ML IJ SOLN
4.0000 mg | Freq: Once | INTRAMUSCULAR | Status: AC
  Administered 2014-01-11: 4 mg via INTRAVENOUS
  Filled 2014-01-11: qty 1

## 2014-01-11 MED ORDER — PROPOFOL 10 MG/ML IV BOLUS
0.5000 mg/kg | Freq: Once | INTRAVENOUS | Status: AC
  Administered 2014-01-11: 40 mg via INTRAVENOUS
  Filled 2014-01-11: qty 20

## 2014-01-11 MED ORDER — FUROSEMIDE 10 MG/ML IJ SOLN
15.0000 mg/h | INTRAVENOUS | Status: DC
  Administered 2014-01-12 – 2014-01-15 (×5): 15 mg/h via INTRAVENOUS
  Filled 2014-01-11 (×12): qty 25

## 2014-01-11 MED ORDER — ENOXAPARIN SODIUM 30 MG/0.3ML ~~LOC~~ SOLN
30.0000 mg | SUBCUTANEOUS | Status: DC
  Administered 2014-01-11 – 2014-01-12 (×2): 30 mg via SUBCUTANEOUS
  Filled 2014-01-11 (×3): qty 0.3

## 2014-01-11 NOTE — ED Provider Notes (Signed)
Medical screening examination/treatment/procedure(s) were conducted as a shared visit with non-physician practitioner(s) and myself.  I personally evaluated the patient during the encounter.  EKG Interpretation   None       Patient presents to the ER for evaluation of syncope. Patient has a history of congestive heart failure, recently hospitalized. He has been experiencing increased dyspnea. He reports that he had nausea and vomiting through the night which caused him to have increased weakness. He passed out this morning, fell and landed on his left side. He suffered a dislocated left shoulder. He has had dislocations previously. This was reduced in the ER. Patient's initial workup is concerning for decompensated congestive heart failure. This will require admission for further management of CHF and syncope.  Procedure: Conscious Sedation Patient was placed on suplemental oxygen as well as continuous cardiac and pulse oximetry monitoring. Patient was administered medications under direct supervision by myself. 0.5 mg per kilogram of propofol and 0.5 mg per kilogram of ketamine were used. Excellent sedation was achieved. Patient's vital signs remained stable. The patient was continuously monitored during the recovery phase. The patient tolerated the sedation without complication. Total time of sedation was 20 minutes.  Procedure: Closed reduction left shoulder This was placed in the supine position. Elbows flexed at 90 and the patient's arm was abducted and externally rotated with a positive reduction of the shoulder x-ray shows reduction without complicated features.  Gilda Crease, MD 01/11/14 [phone]

## 2014-01-11 NOTE — ED Provider Notes (Signed)
CSN: [id number]     Arrival date & time 01/11/14  1102 History   First MD Initiated Contact with Patient 01/11/14 1111     Chief Complaint  Patient presents with  . Loss of Consciousness  . Shoulder Injury   (Consider location/radiation/quality/duration/timing/severity/associated sxs/prior Treatment) HPI Comments: Patient is a 38 year old morbidly obese male with past medical history of CHF, diabetes, hypertension, sleep apnea and dyslipidemia who presents to the emergency department complaining of left shoulder pain after having a syncopal episode. Patient states last night he began to feel nauseated, was vomiting all night. This morning after vomiting he had an unwitnessed syncopal episode, unsure how long he was out for. When he woke up he had severe left shoulder pain. History of shoulder dislocations, the last being many years ago. Shoulder pain worse with any movement. Denies numbness or tingling. Denies chest pain, shortness of breath, dizziness, lightheadedness or confusion. States he is very anxious and tends to be short of breath when he is anxious.  Patient is a 38 y.o. male presenting with syncope and shoulder injury. The history is provided by the patient.  Loss of Consciousness Associated symptoms: shortness of breath   Shoulder Injury    Past Medical History  Diagnosis Date  . CHF (congestive heart failure)   . Diabetes     "borderline:  . HTN (hypertension)   . Morbid obesity with BMI of 50.0-59.9, adult   . Sleep apnea     on Bi Pap  . Dyslipidemia    History reviewed. No pertinent past surgical history. No family history on file. History  Substance Use Topics  . Smoking status: Never Smoker   . Smokeless tobacco: Not on file  . Alcohol Use: No    Review of Systems  Respiratory: Positive for shortness of breath.   Cardiovascular: Positive for syncope.  Musculoskeletal:       Positive for left shoulder pain.  Neurological: Positive for syncope.   Psychiatric/Behavioral: The patient is nervous/anxious.   All other systems reviewed and are negative.    Allergies  Iodine and Shellfish allergy  Home Medications   Current Outpatient Rx  Name  Route  Sig  Dispense  Refill  . albuterol (PROVENTIL HFA;VENTOLIN HFA) 108 (90 BASE) MCG/ACT inhaler   Inhalation   Inhale 2 puffs into the lungs daily as needed for wheezing or shortness of breath.         Marland Kitchen aspirin EC 81 MG tablet   Oral   Take 81 mg by mouth daily.         Marland Kitchen atorvastatin (LIPITOR) 80 MG tablet   Oral   Take 80 mg by mouth daily.         . bumetanide (BUMEX) 2 MG tablet   Oral   Take 4 mg by mouth daily with breakfast.         . isosorbide mononitrate (IMDUR) 30 MG 24 hr tablet   Oral   Take 90 mg by mouth daily.         . potassium chloride SA (K-DUR,KLOR-CON) 20 MEQ tablet   Oral   Take 20 mEq by mouth daily.         . hydrALAZINE (APRESOLINE) 10 MG tablet   Oral   Take 2.5 tablets (25 mg total) by mouth 3 (three) times daily.   90 tablet   5    BP 105/57  Pulse 112  Temp(Src) 97.8 F (36.6 C) (Oral)  Resp 28  Wt 368 lb  13.3 oz (167.3 kg)  SpO2 100% Physical Exam  Nursing note and vitals reviewed. Constitutional: He is oriented to person, place, and time. He appears well-developed and well-nourished. He appears distressed.  Morbidly obese.  HENT:  Head: Normocephalic and atraumatic.  Mouth/Throat: Oropharynx is clear and moist.  Eyes: Conjunctivae are normal.  Neck: Normal range of motion. Neck supple.  Cardiovascular: Regular rhythm, normal heart sounds, intact distal pulses and normal pulses.  Tachycardia present.   Pulses:      Radial pulses are 2+ on the left side.  Non-pitting extremity edema.  Pulmonary/Chest: Tachypnea noted.  Distant lung sounds, exam limited by pt's body habitus.  Abdominal: Soft. Bowel sounds are normal. There is no tenderness.  Musculoskeletal: Normal range of motion. He exhibits no edema.  TTP  left shoulder, deformity noted.  Neurological: He is alert and oriented to person, place, and time. No sensory deficit.  Skin: Skin is warm and dry. He is not diaphoretic.  Psychiatric: He has a normal mood and affect. His behavior is normal.    ED Course  Procedures (including critical care time) Labs Review Labs Reviewed  CBC - Abnormal; Notable for the following:    RBC 3.79 (*)    Hemoglobin 9.7 (*)    HCT 28.9 (*)    MCV 76.3 (*)    MCH 25.6 (*)    RDW 18.0 (*)    All other components within normal limits  COMPREHENSIVE METABOLIC PANEL - Abnormal; Notable for the following:    Sodium 132 (*)    Chloride 84 (*)    Glucose, Bld 135 (*)    BUN 42 (*)    Creatinine, Ser 2.02 (*)    Total Protein 8.7 (*)    AST 41 (*)    Alkaline Phosphatase 173 (*)    Total Bilirubin 2.4 (*)    GFR calc non Af Amer 41 (*)    GFR calc Af Amer 48 (*)    All other components within normal limits  PRO B NATRIURETIC PEPTIDE - Abnormal; Notable for the following:    Pro B Natriuretic peptide (BNP) 2156.0 (*)    All other components within normal limits  POCT I-STAT TROPONIN I   Imaging Review Dg Chest Portable 1 View  01/11/2014   CLINICAL DATA:  Shortness of Breath; recent trauma  EXAM: PORTABLE CHEST - 1 VIEW  COMPARISON:  January 03, 2014  FINDINGS: Cardiomegaly with pulmonary venous hypertension persists. There is trace interstitial edema. Lungs are otherwise clear. No effusions. No adenopathy.  IMPRESSION: There is a degree of congestive heart failure. No consolidation. Heart remains diffusely enlarged. The possibility of underlying pericardial effusion must be a consideration.   Electronically Signed   By: Bretta BangWilliam  Woodruff M.D.   On: 01/11/2014 11:56   Dg Shoulder Left Port  01/11/2014   CLINICAL DATA:  Fall  EXAM: PORTABLE LEFT SHOULDER - 2+ VIEW  COMPARISON:  None.  FINDINGS: Two portable views of the left shoulder submitted. There is anterior inferior subluxation of left humeral head  from glenohumeral joint.  IMPRESSION: Anterior inferior left shoulder subluxation.   Electronically Signed   By: Natasha MeadLiviu  Pop M.D.   On: 01/11/2014 11:55    EKG Interpretation   None       MDM   1. Syncope   2. Shoulder subluxation, left   3. CHF (congestive heart failure)     Pt presenting with shoulder pain after syncopal episode. He is morbidly obese, exam limited by body  habitus. Obvious deformity of left shoulder.  3:04 PM Shoulder subluxation reduced by Dr. Blinda Leatherwood. Pt with CHF, BNP increased from admission 1 week ago, will admit for CHF exacerbation, syncope. Pt recently discharged from cardiology service, I spoke with Trish, cardiology PA who will have pt evaluated for admission. Case discussed with attending Dr. Blinda Leatherwood who also evaluated patient and agrees with plan of care.   Trevor Mace, PA-C 01/11/14 [phone]

## 2014-01-11 NOTE — ED Notes (Signed)
Robyn, PA at the bedside.  

## 2014-01-11 NOTE — ED Notes (Signed)
Report given to Erica, RN

## 2014-01-11 NOTE — ED Notes (Addendum)
Wife, Inetta Fermo, taking kids home [phone]

## 2014-01-11 NOTE — Progress Notes (Signed)
Orthopedic Tech Progress Note Patient Details:  Steven Wyatt August 07, 1976 [id number]  Ortho Devices Type of Ortho Device: Sling immobilizer Ortho Device/Splint Interventions: Application   Cammer, Mickie Bail 01/11/2014, 3:33 PM

## 2014-01-11 NOTE — ED Notes (Signed)
Pt c/o nausea. ED PA notified.

## 2014-01-11 NOTE — ED Notes (Signed)
Pt was home, vomited, and had a syncopal episode.  Pt dislocated shoulder when he fell.

## 2014-01-11 NOTE — Progress Notes (Signed)
Pt has order for PICC line but unable to do tonight, IV team tried for access and unable to attain. Pt has 1 PIV with Milrinone gtt infusing. Called Dr. Ronney Asters because pt has order for Milrinone and Lasix gtt as well as CVP. Will proceed with PICC to be placed in AM. New MD orders carried out.

## 2014-01-11 NOTE — H&P (Signed)
Advanced Heart Failure Team History and Physical Note   Primary Physician: No PCP Per Patient  Primary Cardiologist: Dr Shirlee LatchMcLean  Reason for Admission: Syncope and SOB  HPI:    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 He recently moved here from [place] about a month ago and has been living in a extended motel d/t no housing.  Recently admitted 1/13-1/18 for A/C HF. He was placed on IV lasix gtt and milrinone, however signed out AMA. During his admission his weight decreased from 405 to 368 lbs. ECHO showed EF 20% with diff HK, grade II DD, mod pulm HTN and mod sev LAE. His Cr bumped to 1.56. During his stay got verbally abusive with nursing staff when they tried to educated him ov eating salty foods. Was discharged on bumex 4 mg daily.  Presented to the ED after syncopal episode after emesis. Reports SOB over the last couple days and abdominal pain. Has now moved into apt, however no scale to weigh daily. Taking medications as prescribed, but has been drinking more than 2L a day and some dietary discretion. Patient reports he had to leave Sunday in order to sign lease for family to get a place. + orthopnea, LEE and DOE. Denies CP. Pertinent labs on admission pro-BN) 2156, K+ 4.3 and Cr 2.02  Review of Systems: [y] = yes, [ ]  = no   General: Weight gain [ Y]; Weight loss [ ] ; Anorexia [ ] ; Fatigue [ Y]; Fever [ ] ; Chills [ ] ; Weakness [ ]   Cardiac: Chest pain/pressure [ N]; Resting SOB [Y ]; Exertional SOB [Y ]; Orthopnea [Y ]; Pedal Edema [ Y]; Palpitations [ ] ; Syncope [ Y]; Presyncope [ ] ; Paroxysmal nocturnal dyspnea[ ]   Pulmonary: Cough [ ] ; Wheezing[ ] ; Hemoptysis[ ] ; Sputum [ ] ; Snoring [Y ]  GI: Vomiting[ ] ; Dysphagia[ ] ; Melena[ ] ; Hematochezia [ ] ; Heartburn[ ] ; Abdominal pain [Y ]; Constipation [ ] ; Diarrhea [ ] ; BRBPR [ ]   GU: Hematuria[ ] ; Dysuria [ ] ; Nocturia[ ]   Vascular: Pain in legs with  walking [ ] ; Pain in feet with lying flat [ ] ; Non-healing sores [ ] ; Stroke [ ] ; TIA [ ] ; Slurred speech [ ] ;  Neuro: Headaches[ ] ; Vertigo[ ] ; Seizures[ ] ; Paresthesias[ ] ;Blurred vision [ ] ; Diplopia [ ] ; Vision changes [ ]   Ortho/Skin: Arthritis [ ] ; Joint pain [ ] ; Muscle pain [ ] ; Joint swelling [ ] ; Back Pain [ ] ; Rash [ ]   Psych: Depression[ ] ; Anxiety[ ]   Heme: Bleeding problems [ ] ; Clotting disorders [ ] ; Anemia [ ]   Endocrine: Diabetes [ ] ; Thyroid dysfunction[ ]   Home Medications Prior to Admission medications   Medication Sig Start Date End Date Taking? Authorizing Provider  albuterol (PROVENTIL HFA;VENTOLIN HFA) 108 (90 BASE) MCG/ACT inhaler Inhale 2 puffs into the lungs daily as needed for wheezing or shortness of breath. 01/08/14  Yes Wilburt FinlayBryan Hager, PA-C  aspirin EC 81 MG tablet Take 81 mg by mouth daily.   Yes Historical Provider, MD  atorvastatin (LIPITOR) 80 MG tablet Take 80 mg by mouth daily. 01/08/14  Yes Wilburt FinlayBryan Hager, PA-C  bumetanide (BUMEX) 2 MG tablet Take 4 mg by mouth daily with breakfast. 01/08/14  Yes Wilburt FinlayBryan Hager, PA-C  isosorbide mononitrate (IMDUR) 30 MG 24 hr tablet Take 90 mg by mouth daily. 01/08/14  Yes Wilburt FinlayBryan Hager, PA-C  potassium chloride SA (K-DUR,KLOR-CON) 20 MEQ  tablet Take 20 mEq by mouth daily.   Yes Historical Provider, MD  hydrALAZINE (APRESOLINE) 10 MG tablet Take 2.5 tablets (25 mg total) by mouth 3 (three) times daily. 01/08/14   Wilburt Finlay, PA-C    Past Medical History: Past Medical History  Diagnosis Date  . CHF (congestive heart failure)   . Diabetes     "borderline:  . HTN (hypertension)   . Morbid obesity with BMI of 50.0-59.9, adult   . Sleep apnea     on Bi Pap  . Dyslipidemia     Past Surgical History: History reviewed. No pertinent past surgical history.  Family History: No family history on file.  Social History: History   Social History  . Marital Status: Married    Spouse Name: N/A    Number of Children: N/A  . Years  of Education: N/A   Social History Main Topics  . Smoking status: Never Smoker   . Smokeless tobacco: None  . Alcohol Use: No  . Drug Use: No  . Sexual Activity: None   Other Topics Concern  . None   Social History Narrative  . None    Allergies:  Allergies  Allergen Reactions  . Iodine Anaphylaxis  . Shellfish Allergy Anaphylaxis    Objective:    Vital Signs:   Temp:  [97.8 F (36.6 C)] 97.8 F (36.6 C) (01/21 1100) Pulse Rate:  [56-117] 116 (01/21 1522) Resp:  [14-34] 24 (01/21 1518) BP: (101-158)/(41-92) 114/77 mmHg (01/21 1522) SpO2:  [93 %-100 %] 99 % (01/21 1522) Weight:  [368 lb 13.3 oz (167.3 kg)] 368 lb 13.3 oz (167.3 kg) (01/21 1300)   Filed Weights   01/11/14 1300  Weight: 368 lb 13.3 oz (167.3 kg)    Physical Exam: General:  NAD, obese, SOB with conversation   HEENT: normal Neck: supple. JVP 16 cm. Carotids 2+ bilat; no bruits. No lymphadenopathy or thryomegaly appreciated. Cor: PMI nondisplaced. Regular rate & rhythm. No rubs, gallops or murmurs. Lungs: clear Abdomen: soft, nontender, +distended. No hepatosplenomegaly. No bruits or masses. Good bowel sounds. Extremities: no cyanosis, clubbing, rash, bilateral woody edema up to thigh Neuro: alert & orientedx3, cranial nerves grossly intact. moves all 4 extremities w/o difficulty. Affect pleasant  Telemetry: ST 100-110  Labs: Basic Metabolic Panel:  Recent Labs Lab 01/04/14 1545 01/05/14 0523 01/06/14 0345 01/08/14 0500 01/11/14 1121  NA 134* 133* 131* 136* 132*  K 3.7 3.6* 3.6* 4.0 4.3  CL 92* 90* 88* 89* 84*  CO2 28 24 31  34* 24  GLUCOSE 137* 161* 129* 109* 135*  BUN 25* 29* 33* 33* 42*  CREATININE 1.19 1.43* 1.55* 1.56* 2.02*  CALCIUM 8.4 8.2* 8.4 9.1 9.3  MG  --   --  1.6 1.7  --     Liver Function Tests:  Recent Labs Lab 01/11/14 1121  AST 41*  ALT 40  ALKPHOS 173*  BILITOT 2.4*  PROT 8.7*  ALBUMIN 3.8   No results found for this basename: LIPASE, AMYLASE,  in the  last 168 hours No results found for this basename: AMMONIA,  in the last 168 hours  CBC:  Recent Labs Lab 01/11/14 1121  WBC 7.0  HGB 9.7*  HCT 28.9*  MCV 76.3*  PLT 327    Cardiac Enzymes: No results found for this basename: CKTOTAL, CKMB, CKMBINDEX, TROPONINI,  in the last 168 hours  BNP: BNP (last 3 results)  Recent Labs  01/03/14 1248 01/11/14 1121  PROBNP 1164.0* 2156.0*  CBG: No results found for this basename: GLUCAP,  in the last 168 hours  Coagulation Studies: No results found for this basename: LABPROT, INR,  in the last 72 hours  Other results: ST 111 bpm  Imaging: Dg Chest Portable 1 View  01/11/2014   CLINICAL DATA:  Shortness of Breath; recent trauma  EXAM: PORTABLE CHEST - 1 VIEW  COMPARISON:  January 03, 2014  FINDINGS: Cardiomegaly with pulmonary venous hypertension persists. There is trace interstitial edema. Lungs are otherwise clear. No effusions. No adenopathy.  IMPRESSION: There is a degree of congestive heart failure. No consolidation. Heart remains diffusely enlarged. The possibility of underlying pericardial effusion must be a consideration.   Electronically Signed   By: Bretta Bang M.D.   On: 01/11/2014 11:56   Dg Shoulder Left Port  01/11/2014   CLINICAL DATA:  Postreduction  EXAM: PORTABLE LEFT SHOULDER - 2+ VIEW  COMPARISON:  None.  FINDINGS: Interval reduction of the left shoulder anterior dislocation. Glenohumeral joint is intact. No evidence of fracture.  IMPRESSION: Successful reduction of left shoulder dislocation.   Electronically Signed   By: Genevive Bi M.D.   On: 01/11/2014 15:13   Dg Shoulder Left Port  01/11/2014   CLINICAL DATA:  Fall  EXAM: PORTABLE LEFT SHOULDER - 2+ VIEW  COMPARISON:  None.  FINDINGS: Two portable views of the left shoulder submitted. There is anterior inferior subluxation of left humeral head from glenohumeral joint.  IMPRESSION: Anterior inferior left shoulder subluxation.   Electronically Signed    By: Natasha Mead M.D.   On: 01/11/2014 11:55         Assessment/Plan   1) A/C systolic HF: NICM, EF 20% (12/2013). Mr. Jachim is known to the HF team and was recently admitted 01/03/14 for A/C systolic HF and was placed on lasix gtt and milrinone for diuresis. Unfortunately he signed out AMA on Sunday d/t family issues and the need to sign a lease so his family would not be homeless. Presented back to the ED today after syncopal episode after emesis along with having increased SOB. Pro-BNP elevated and appears to have quite a bit of volume on board. Concerned that he maybe suffering from low CO, with cardiorenal component. Will admit for PICC placement. Will follow co-ox's, kidney function and CVPs. Start milrinone 0.25 and then 30 min later start lasix gtt 15 mg/hr. BMET daily. Not on a BB with acute decompensation and no ACE-I or Spiro with AKI. Will continue IMDUR and hydralazine for afterload reduction at reduced dose. Hydralazine 12.5 mg TID and IMDUR 30 mg. 2) Subluxation L shoulder - complaining of mild pain currently. Successful reduction of left shoulder dislocation. 4) OSA/OHS- Will continue bipap at night 5) AKI- Cr bump from discharge 1.56>2.02. As above likely related to cardiorenal syndrome and will start milrinone. 7) Abdominal pain- continue to follow  Length of Stay: 0 Aundria Rud NP-C 01/11/2014, 3:32 PM  Advanced Heart Failure Team Pager [phone] (M-F; 7a - 4p)  Please contact [facility] for night-coverage after hours (4p -7a ) and weekends on [email or web address]  Patient seen with NP, agree with the above note.    Patient has low output biventricular failure (see co-ox readings prior to milrinone from last admission) with massive volume overload.  He insisted on leaving the hospital last Sunday prior to adequate diuresis and is now back.   1.  Acute on chronic systolic CHF: Nonischemic cardiomyopathy with biventricular failure, low output and massive volume overload.  He  diuresed well at last admission on milrinone + Lasix gtt.  He will be in the hospital for a number of days for adequate diuresis.  - Milrinone 0.25 mcg/kg/min, after this is started begin lasix gtt 15 mg/hr (prior regimen).  - Can continue hydralazine and Imdur at low doses for now.  - PICC for monitoring co-ox and CVP.   2. AKI on CKD: Suspect cardiorenal syndrome.  Hope this will improve with milrinone.  3. Abdominal pain/nausea: Abdomen is distended but not tender. Suspect this is due to RV failure with gut edema/congestion.  4. Syncope: In the setting of violent vomiting.  Doubt arrhythmic, probably vagal medially. Monitor on telemetry.  If he can be stabilized, will be candidate for ICD (borderline for CRT with QRS about 125 msec in LBBB pattern).  5. OSA: Needs Bipap at night.   Marca Ancona 01/11/2014 5:13 PM

## 2014-01-11 NOTE — Progress Notes (Signed)
New admission from ED.  Patient has order for BIPAP QHS.  Upon arrival to room patient has increased WOB and use of accessory muscles.  Placed patient on BIPAP 20/14 per patient home settings with 2 lpm O2 bleed in.  Patient tolerating well at this time. RN aware. RT will continue to monitor.

## 2014-01-11 NOTE — ED Notes (Signed)
Shoulder put back in place by EDP.

## 2014-01-12 LAB — BASIC METABOLIC PANEL
BUN: 50 mg/dL — ABNORMAL HIGH (ref 6–23)
CO2: 24 meq/L (ref 19–32)
Calcium: 9.3 mg/dL (ref 8.4–10.5)
Chloride: 86 mEq/L — ABNORMAL LOW (ref 96–112)
Creatinine, Ser: 2.21 mg/dL — ABNORMAL HIGH (ref 0.50–1.35)
GFR calc Af Amer: 43 mL/min — ABNORMAL LOW (ref >90)
GFR, EST NON AFRICAN AMERICAN: 37 mL/min — AB (ref >90)
Glucose, Bld: 133 mg/dL — ABNORMAL HIGH (ref 70–99)
POTASSIUM: 4.7 meq/L (ref 3.7–5.3)
SODIUM: 133 meq/L — AB (ref 137–147)

## 2014-01-12 LAB — CARBOXYHEMOGLOBIN
Carboxyhemoglobin: 1.8 % — ABNORMAL HIGH (ref 0.5–1.5)
Methemoglobin: 0.9 % (ref 0.0–1.5)
O2 SAT: 52.5 %
TOTAL HEMOGLOBIN: 9.4 g/dL — AB (ref 13.5–18.0)

## 2014-01-12 MED ORDER — SODIUM CHLORIDE 0.9 % IJ SOLN
10.0000 mL | INTRAMUSCULAR | Status: DC | PRN
  Administered 2014-01-12: 10 mL

## 2014-01-12 MED ORDER — METOLAZONE 5 MG PO TABS
5.0000 mg | ORAL_TABLET | Freq: Two times a day (BID) | ORAL | Status: DC
  Administered 2014-01-12: 5 mg via ORAL
  Filled 2014-01-12 (×3): qty 1

## 2014-01-12 MED ORDER — SODIUM CHLORIDE 0.9 % IJ SOLN
10.0000 mL | Freq: Two times a day (BID) | INTRAMUSCULAR | Status: DC
  Administered 2014-01-13: 20 mL
  Administered 2014-01-14: 10 mL

## 2014-01-12 NOTE — Progress Notes (Signed)
Utilization review completed. Rhiannan Kievit, RN, BSN. 

## 2014-01-12 NOTE — Progress Notes (Signed)
Peripherally Inserted Central Catheter/Midline Placement  The IV Nurse has discussed with the patient and/or persons authorized to consent for the patient, the purpose of this procedure and the potential benefits and risks involved with this procedure.  The benefits include less needle sticks, lab draws from the catheter and patient may be discharged home with the catheter.  Risks include, but not limited to, infection, bleeding, blood clot (thrombus formation), and puncture of an artery; nerve damage and irregular heat beat.  Alternatives to this procedure were also discussed.  PICC/Midline Placement Documentation        Stacie Glaze Horton 01/12/2014, 9:04 AM

## 2014-01-12 NOTE — Plan of Care (Signed)
Problem: Food- and Nutrition-Related Knowledge Deficit (NB-1.1) Goal: Nutrition education Formal process to instruct or train a patient/client in a skill or to impart knowledge to help patients/clients voluntarily manage or modify food choices and eating behavior to maintain or improve health. Outcome: Completed/Met Date Met:  01/12/14 Nutrition Education Note  RD consulted for nutrition education regarding CHF.  Pt seems very motivated to make changes. Pt reports that for the last 2 weeks he and his family had been living out of a hotel and he had been eating all meals out.  Pt had cut out using the salt shaker but was still eating foods that contained salt without knowing. Pt had been eating canned soup at least 2 days a week.  Pt reports that cost is an issue. We reviewed money saving tips on his diet (i.e. Cooking more from scratch, freezer cooking)  RD provided "Low Sodium Nutrition Therapy" handout from the Academy of [employer]. Reviewed patient's dietary recall. Provided examples on ways to decrease sodium intake in diet. Discouraged intake of processed foods and use of salt shaker. Encouraged fresh fruits and vegetables as well as whole grain sources of carbohydrates to maximize fiber intake.   RD discussed why it is important for patient to adhere to diet recommendations, and emphasized the role of fluids, foods to avoid, and importance of weighing self daily. Teach back method used.  Expect fair to good compliance.  Body mass index is 56.62 kg/(m^2). Pt meets criteria for extreme obesity class III based on current BMI.  Current diet order is Heart Healthy, patient is consuming approximately 100% of meals at this time. Labs and medications reviewed. No further nutrition interventions warranted at this time. RD contact information provided. If additional nutrition issues arise, please re-consult RD.   [place], [place], [place] Pager [phone] After Hours  Pager

## 2014-01-12 NOTE — Progress Notes (Signed)
Advanced Heart Failure Rounding Note  Primary Physician: No PCP Per Patient  Primary Cardiologist: Dr Shirlee LatchMcLean   Subjective:    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 He recently moved here from [place] about a month ago and has been living in a extended motel d/t no housing.   Recently admitted 1/13-1/18 for A/C HF. He was placed on IV lasix gtt and milrinone, however signed out AMA. During his admission his weight decreased from 405 to 368 lbs. ECHO showed EF 20% with diff HK, grade II DD, mod pulm HTN and mod sev LAE. His Cr bumped to 1.56. During his stay got verbally abusive with nursing staff when they tried to educated him ov eating salty foods. Was discharged on bumex 4 mg daily.   Admitted yesterday after presenting to the ED after syncopal episode while vomiting and dislocated his shoulder. Found to also be massively volume overloaded after not being fully diuresed from prior admission. He was up 15 lbs from discharge on Sunday. Started on lasix gtt 15 mg/hr and milrinone 0.25. Awaiting PICC. Cr 2.2  Objective:   Weight Range:  Vital Signs:   Temp:  [97.6 F (36.4 C)-98.4 F (36.9 C)] 97.6 F (36.4 C) (01/22 0743) Pulse Rate:  [56-117] 100 (01/22 0743) Resp:  [14-34] 19 (01/22 0743) BP: (80-158)/(37-92) 97/47 mmHg (01/22 0743) SpO2:  [87 %-100 %] 87 % (01/22 0743) Weight:  [368 lb 13.3 oz (167.3 kg)-383 lb 9.6 oz (174 kg)] 383 lb 9.6 oz (174 kg) (01/22 0500)    Weight change: Filed Weights   01/11/14 1300 01/11/14 2107 01/12/14 0500  Weight: 368 lb 13.3 oz (167.3 kg) 383 lb 9.6 oz (174 kg) 383 lb 9.6 oz (174 kg)    Intake/Output:   Intake/Output Summary (Last 24 hours) at 01/12/14 0841 Last data filed at 01/12/14 0600  Gross per 24 hour  Intake 575.71 ml  Output   1700 ml  Net -1124.29 ml     Physical Exam: General: NAD, obese, SOB with conversation  HEENT: normal  Neck:  supple. JVP 16 cm. Carotids 2+ bilat; no bruits. No lymphadenopathy or thryomegaly appreciated.  Cor: PMI nondisplaced. Regular rate & rhythm. No rubs, gallops or murmurs.  Lungs: clear  Abdomen: obese soft, nontender, +distended. No hepatosplenomegaly. No bruits or masses. Good bowel sounds.  Extremities: no cyanosis, clubbing, rash, bilateral woody edema up to thigh  Neuro: alert & orientedx3, cranial nerves grossly intact. moves all 4 extremities w/o difficulty. Affect pleasant  Telemetry: ST 100  Labs: Basic Metabolic Panel:  Recent Labs Lab 01/06/14 0345 01/08/14 0500 01/11/14 1121 01/12/14 0316  NA 131* 136* 132* 133*  K 3.6* 4.0 4.3 4.7  CL 88* 89* 84* 86*  CO2 31 34* 24 24  GLUCOSE 129* 109* 135* 133*  BUN 33* 33* 42* 50*  CREATININE 1.55* 1.56* 2.02* 2.21*  CALCIUM 8.4 9.1 9.3 9.3  MG 1.6 1.7  --   --     Liver Function Tests:  Recent Labs Lab 01/11/14 1121  AST 41*  ALT 40  ALKPHOS 173*  BILITOT 2.4*  PROT 8.7*  ALBUMIN 3.8   No results found for this basename: LIPASE, AMYLASE,  in the last 168 hours No results found for this basename: AMMONIA,  in the last 168 hours  CBC:  Recent Labs Lab 01/11/14 1121  WBC 7.0  HGB 9.7*  HCT  28.9*  MCV 76.3*  PLT 327    Cardiac Enzymes: No results found for this basename: CKTOTAL, CKMB, CKMBINDEX, TROPONINI,  in the last 168 hours  BNP: BNP (last 3 results)  Recent Labs  01/03/14 1248 01/11/14 1121  PROBNP 1164.0* 2156.0*    Imaging: Dg Chest Portable 1 View  01/11/2014   CLINICAL DATA:  Shortness of Breath; recent trauma  EXAM: PORTABLE CHEST - 1 VIEW  COMPARISON:  January 03, 2014  FINDINGS: Cardiomegaly with pulmonary venous hypertension persists. There is trace interstitial edema. Lungs are otherwise clear. No effusions. No adenopathy.  IMPRESSION: There is a degree of congestive heart failure. No consolidation. Heart remains diffusely enlarged. The possibility of underlying pericardial effusion  must be a consideration.   Electronically Signed   By: Bretta Bang M.D.   On: 01/11/2014 11:56   Dg Shoulder Left Port  01/11/2014   CLINICAL DATA:  Postreduction  EXAM: PORTABLE LEFT SHOULDER - 2+ VIEW  COMPARISON:  None.  FINDINGS: Interval reduction of the left shoulder anterior dislocation. Glenohumeral joint is intact. No evidence of fracture.  IMPRESSION: Successful reduction of left shoulder dislocation.   Electronically Signed   By: Genevive Bi M.D.   On: 01/11/2014 15:13   Dg Shoulder Left Port  01/11/2014   CLINICAL DATA:  Fall  EXAM: PORTABLE LEFT SHOULDER - 2+ VIEW  COMPARISON:  None.  FINDINGS: Two portable views of the left shoulder submitted. There is anterior inferior subluxation of left humeral head from glenohumeral joint.  IMPRESSION: Anterior inferior left shoulder subluxation.   Electronically Signed   By: Natasha Mead M.D.   On: 01/11/2014 11:55      Medications:     Scheduled Medications: . aspirin EC  81 mg Oral Daily  . atorvastatin  80 mg Oral q1800  . enoxaparin (LOVENOX) injection  30 mg Subcutaneous Q24H  . furosemide  80 mg Intravenous Q6H  . hydrALAZINE  12.5 mg Oral Q6H  . isosorbide mononitrate  30 mg Oral Daily  . potassium chloride  20 mEq Oral Daily  . sodium chloride  3 mL Intravenous Q12H     Infusions: . furosemide (LASIX) infusion    . milrinone 0.25 mcg/kg/min (01/12/14 0427)     PRN Medications:  sodium chloride, acetaminophen, ondansetron (ZOFRAN) IV, sodium chloride   Assessment/Plan   1) A/C systolic HF: NICM, EF 20% (12/2013). Steven Wyatt is known to the HF team and was recently admitted 01/03/14 for A/C systolic HF and was placed on lasix gtt and milrinone for diuresis. Unfortunately he signed out AMA on Sunday d/t family issues and the need to sign a lease so his family would not be homeless. Presented back to the ED today after syncopal episode after emesis along with having increased SOB. Pro-BNP elevated and appears to have  quite a bit of volume on board. - BB on hold with low output. - Continue lasix gtt at 15 mg/hr and will add metolazone 5 mg BID. - no ACE-I or Spiro with AKI. - Continue IMDUR and hydralaizine for afterload reduction.  - Will get PICC placed today to monitor CVPs and co-oxs. Will likely need home inotropes and will start paperwork.  - consult to dietician for HF education 2) OSA/OHS- Will continue bipap at night  3) AKI on CKD: Cr continues to rise. Will follow Cr closely. Continue milrinone.   4) Syncope: In the setting of violent vomiting. Doubt arrhythmic, probably vagal medially. Monitor on telemetry. If he can be  stabilized, will be candidate for ICD (borderline for CRT with QRS about 125 msec in LBBB pattern).   Dr. Gala Romney had lengthy discussion with patient and wife about his HF disease and prognosis. He is currently not a transplant or LVAD candidate. Will start process for home inotropes.   Length of Stay: 1 Steven Rud NP-C 01/12/2014, 8:41 AM  Advanced Heart Failure Team Pager [phone] (M-F; 7a - 4p)  Please contact [facility] for night-coverage after hours (4p -7a ) and weekends on [email or web address]  Patient seen and examined with Ulla Potash, NP. We discussed all aspects of the encounter. I agree with the assessment and plan as stated above.   Steven Wyatt has advanced HF due to severe NICM with markedly depressed LV function. Recent co-ox confirms low output physiology. Currently not transplant or VAD candidate due to size, renal failure and social situation. Will need home inotropes. Long talk about his HF and prognosis - I told him his mortality rate is 50% or greater at 1 year. We also had a very frank discussion about need for compliance and him to take more responsibility for taking care of his HF if he is to become a candidate for advanced therapies down the road (renal function permitting). Continue milrinone and IV diuresis. Nutrition to see. Not candidate for b-block  due to low output. No ACE/ARB due to renal failure. Hopefully renal function will improve with inotrope support. Will need referral to EP for ICD +/- CRT once he proves compliance with med regimen.  Total time spent 70 minutes with over half that time spent discussing above.  Steven Wyatt 6:21 PM

## 2014-01-12 NOTE — Progress Notes (Signed)
Nutrition Brief Note  Patient identified on the Malnutrition Screening Tool (MST) Report for recent weight lost without trying (patient unsure).  Per weight readings, patient's weight has trended up given volume overload.  Wt Readings from Last 15 Encounters:  01/12/14 383 lb 9.6 oz (174 kg)  01/08/14 368 lb 13.3 oz (167.3 kg)    BMI is 54.5 kg/m2 (based on 01/08/14 weight).  Patient meets criteria for Obesity Class III.  Current diet order is Heart Healthy.  Labs and medications reviewed.   No nutrition interventions warranted at this time. If nutrition issues arise, please consult RD.   Maureen Chatters, RD, LDN Pager #: [phone] After-Hours Pager #: [phone]

## 2014-01-12 NOTE — Care Management Note (Addendum)
    Page 1 of 2   01/16/2014     10:41:11 AM   CARE MANAGEMENT NOTE 01/16/2014  Patient:  Steven Wyatt, Steven Wyatt   Account Number:  [id number]  Date Initiated:  01/12/2014  Documentation initiated by:  Avie Arenas  Subjective/Objective Assessment:   Admitted with CHF     Action/Plan:   lives w wife and children   Anticipated DC Date:  01/16/2014   Anticipated DC Plan:  LONG TERM ACUTE CARE (LTAC)      DC Planning Services  CM consult      [facility] Choice  HOME HEALTH  DURABLE MEDICAL EQUIPMENT   Choice offered to / List presented to:  C-1 Patient   DME arranged  IV PUMP/EQUIPMENT      DME agency  [facility]     [facility] arranged  HH-1 RN  HH-10 DISEASE MANAGEMENT      HH agency  [facility]   Status of service:  Completed, signed off Medicare Important Message given?   (If response is "NO", the following Medicare IM given date fields will be blank) Date Medicare IM given:   Date Additional Medicare IM given:    Discharge Disposition:  HOME W HOME HEALTH SERVICES  Per UR Regulation:  Reviewed for med. necessity/level of care/duration of stay  If discussed at Long Length of Stay Meetings, dates discussed:   01/17/2014    Comments:  Contact:  Ruddick,Chantina Spouse [phone]  1/26 have alerted pam w [facility] of dc home today. debbie Garo [place] rn,bsn 1040a  01-12-14 2pm Avie Arenas, RNBSN [phone] Last admission family homeless - left hospital AMA.  Now according to notes live in apt but no scales and dietary discretion.  Talking about need for Milrinone drip - ?? Ltach may be option for him intitially.   Talked with Karie Mainland NP and possibility of Ltach - did confirm he left AMA to sign housing lease and is now in a house, so not sure will need Ltach.  CM will continue to follow.  Patient sitting on edge of bed - states independent at home prior. Understands will be on Milrinone drip.  Would like HH to follow and he will do whatever he has to do to assist. Does not  want to stay in hospital or faciltiy any longer that needed.  Choose [facility] to follow at home.  CM will continue to follow.

## 2014-01-12 NOTE — Clinical Documentation Improvement (Signed)
Possible Clinical Conditions?   _______CKD Stage I - GFR > OR = 90 _______CKD Stage II - GFR 60-80 _______CKD Stage III - GFR 30-59 _______CKD Stage IV - GFR 15-29 _______CKD Stage V - GFR < 15 _______Cannot Clinically determine   Supporting Information:  Per H&P and Physician Progress Note on 01/12/14, "AKI on CKD: Cr continues to rise."   Labs: Creatinine 01/11/14 2.02 mg/dL Creatinine 1/85/63 1.49 mg/dL  GFR  06/22/62 41 mL/min GFR  01/03/14 67 mL/min     Thank You,  Darla Lesches, RN, BSN, CCRN Clinical Documentation Improvement Specialist HIM department--[place] Office [phone]

## 2014-01-12 NOTE — Progress Notes (Signed)
Pharmacist Heart Failure Core Measure Documentation  Assessment: Steven Wyatt has an EF documented as 20% on 1/14 by Echo.  Rationale: Heart failure patients with left ventricular systolic dysfunction (LVSD) and an EF < 40% should be prescribed an angiotensin converting enzyme inhibitor (ACEI) or angiotensin receptor blocker (ARB) at discharge unless a contraindication is documented in the medical record.  This patient is not currently on an ACEI or ARB for HF.  This note is being placed in the record in order to provide documentation that a contraindication to the use of these agents is present for this encounter.  ACE Inhibitor or Angiotensin Receptor Blocker is contraindicated (specify all that apply)  []   ACEI allergy AND ARB allergy []   Angioedema []   Moderate or severe aortic stenosis []   Hyperkalemia []   Hypotension []   Renal artery stenosis [x]   Worsening renal function, preexisting renal disease or dysfunction   [name] 01/12/2014 2:58 PM

## 2014-01-12 NOTE — Progress Notes (Signed)
Pt refusing foley catheter and aware he will be receiving IVP Lasix, able to use urinal by self to get accurate output.

## 2014-01-13 ENCOUNTER — Encounter: Payer: Medicare Other | Admitting: Nurse Practitioner

## 2014-01-13 LAB — BASIC METABOLIC PANEL
BUN: 46 mg/dL — AB (ref 6–23)
CALCIUM: 9 mg/dL (ref 8.4–10.5)
CO2: 30 mEq/L (ref 19–32)
Chloride: 91 mEq/L — ABNORMAL LOW (ref 96–112)
Creatinine, Ser: 2.29 mg/dL — ABNORMAL HIGH (ref 0.50–1.35)
GFR calc Af Amer: 41 mL/min — ABNORMAL LOW (ref >90)
GFR, EST NON AFRICAN AMERICAN: 35 mL/min — AB (ref >90)
Glucose, Bld: 173 mg/dL — ABNORMAL HIGH (ref 70–99)
Potassium: 3.5 mEq/L — ABNORMAL LOW (ref 3.7–5.3)
Sodium: 136 mEq/L — ABNORMAL LOW (ref 137–147)

## 2014-01-13 MED ORDER — POTASSIUM CHLORIDE CRYS ER 20 MEQ PO TBCR
40.0000 meq | EXTENDED_RELEASE_TABLET | Freq: Two times a day (BID) | ORAL | Status: DC
  Administered 2014-01-13 – 2014-01-14 (×4): 40 meq via ORAL
  Filled 2014-01-13 (×6): qty 2

## 2014-01-13 MED ORDER — HYDRALAZINE HCL 25 MG PO TABS
12.5000 mg | ORAL_TABLET | Freq: Three times a day (TID) | ORAL | Status: DC
  Administered 2014-01-13: 15:00:00 via ORAL
  Administered 2014-01-13 – 2014-01-16 (×9): 12.5 mg via ORAL
  Filled 2014-01-13 (×12): qty 0.5

## 2014-01-13 MED ORDER — METOLAZONE 5 MG PO TABS
5.0000 mg | ORAL_TABLET | Freq: Every day | ORAL | Status: DC
  Administered 2014-01-13 – 2014-01-14 (×2): 5 mg via ORAL
  Filled 2014-01-13 (×3): qty 1

## 2014-01-13 MED ORDER — ENOXAPARIN SODIUM 80 MG/0.8ML ~~LOC~~ SOLN
80.0000 mg | SUBCUTANEOUS | Status: DC
  Administered 2014-01-13: 80 mg via SUBCUTANEOUS
  Filled 2014-01-13 (×4): qty 0.8

## 2014-01-13 NOTE — Progress Notes (Signed)
Advanced Heart Failure Rounding Note  Primary Physician: No PCP Per Patient  Primary Cardiologist: Dr Shirlee Latch   Subjective:    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 He recently moved here from [place] about a month ago and has been living in a extended motel d/t no housing.   Recently admitted 1/13-1/18 for A/C HF. He was placed on IV lasix gtt and milrinone, however signed out AMA. During his admission his weight decreased from 405 to 368 lbs. ECHO showed EF 20% with diff HK, grade II DD, mod pulm HTN and mod sev LAE. His Cr bumped to 1.56. During his stay got verbally abusive with nursing staff when they tried to educated him ov eating salty foods. Was discharged on bumex 4 mg daily.   Admitted 1/21 after presenting to the ED after syncopal episode while vomiting and dislocated his shoulder. Found to also be massively volume overloaded after not being fully diuresed from prior admission. On lasix gtt 15 mg/hr, milrinone 0.25 and metolazone 5 mg daily. Weight down 5 lbs and 24 hr I/O -3 liters.   Co-ox 53% Cr 2.29  Objective:   Weight Range:  Vital Signs:   Temp:  [97.3 F (36.3 C)-98.5 F (36.9 C)] 98.5 F (36.9 C) (01/23 0400) Pulse Rate:  [68-115] 102 (01/23 0400) Resp:  [12-31] 14 (01/23 0400) BP: (83-148)/(46-124) 118/59 mmHg (01/23 0535) SpO2:  [87 %-100 %] 96 % (01/23 0400) Weight:  [378 lb 15.5 oz (171.9 kg)] 378 lb 15.5 oz (171.9 kg) (01/23 0400)    Weight change: Filed Weights   01/11/14 2107 01/12/14 0500 01/13/14 0400  Weight: 383 lb 9.6 oz (174 kg) 383 lb 9.6 oz (174 kg) 378 lb 15.5 oz (171.9 kg)    Intake/Output:   Intake/Output Summary (Last 24 hours) at 01/13/14 0725 Last data filed at 01/13/14 0600  Gross per 24 hour  Intake 1397.75 ml  Output   4450 ml  Net -3052.25 ml     Physical Exam: General: NAD, obese, SOB with conversation  HEENT: normal  Neck:  supple. JVP 16 cm. Carotids 2+ bilat; no bruits. No lymphadenopathy or thryomegaly appreciated.  Cor: PMI nondisplaced. Regular rate & rhythm. No rubs, gallops or murmurs.  Lungs: clear  Abdomen: obese soft, nontender, +distended. No hepatosplenomegaly. No bruits or masses. Good bowel sounds.  Extremities: no cyanosis, clubbing, rash, bilateral woody edema up to thigh  Neuro: alert & orientedx3, cranial nerves grossly intact. moves all 4 extremities w/o difficulty. Affect pleasant  Telemetry: ST 100  Labs: Basic Metabolic Panel:  Recent Labs Lab 01/08/14 0500 01/11/14 1121 01/12/14 0316 01/13/14 0450  NA 136* 132* 133* 136*  K 4.0 4.3 4.7 3.5*  CL 89* 84* 86* 91*  CO2 34* 24 24 30   GLUCOSE 109* 135* 133* 173*  BUN 33* 42* 50* 46*  CREATININE 1.56* 2.02* 2.21* 2.29*  CALCIUM 9.1 9.3 9.3 9.0  MG 1.7  --   --   --     Liver Function Tests:  Recent Labs Lab 01/11/14 1121  AST 41*  ALT 40  ALKPHOS 173*  BILITOT 2.4*  PROT 8.7*  ALBUMIN 3.8   No results found for this basename: LIPASE, AMYLASE,  in the last 168 hours No results found for this basename: AMMONIA,  in the last 168 hours  CBC:  Recent Labs Lab 01/11/14 1121  WBC 7.0  HGB  9.7*  HCT 28.9*  MCV 76.3*  PLT 327    Cardiac Enzymes: No results found for this basename: CKTOTAL, CKMB, CKMBINDEX, TROPONINI,  in the last 168 hours  BNP: BNP (last 3 results)  Recent Labs  01/03/14 1248 01/11/14 1121  PROBNP 1164.0* 2156.0*    Imaging: Dg Chest Portable 1 View  01/11/2014   CLINICAL DATA:  Shortness of Breath; recent trauma  EXAM: PORTABLE CHEST - 1 VIEW  COMPARISON:  January 03, 2014  FINDINGS: Cardiomegaly with pulmonary venous hypertension persists. There is trace interstitial edema. Lungs are otherwise clear. No effusions. No adenopathy.  IMPRESSION: There is a degree of congestive heart failure. No consolidation. Heart remains diffusely enlarged. The possibility of underlying pericardial effusion  must be a consideration.   Electronically Signed   By: Bretta Bang M.D.   On: 01/11/2014 11:56   Dg Shoulder Left Port  01/11/2014   CLINICAL DATA:  Postreduction  EXAM: PORTABLE LEFT SHOULDER - 2+ VIEW  COMPARISON:  None.  FINDINGS: Interval reduction of the left shoulder anterior dislocation. Glenohumeral joint is intact. No evidence of fracture.  IMPRESSION: Successful reduction of left shoulder dislocation.   Electronically Signed   By: Genevive Bi M.D.   On: 01/11/2014 15:13   Dg Shoulder Left Port  01/11/2014   CLINICAL DATA:  Fall  EXAM: PORTABLE LEFT SHOULDER - 2+ VIEW  COMPARISON:  None.  FINDINGS: Two portable views of the left shoulder submitted. There is anterior inferior subluxation of left humeral head from glenohumeral joint.  IMPRESSION: Anterior inferior left shoulder subluxation.   Electronically Signed   By: Natasha Mead M.D.   On: 01/11/2014 11:55     Medications:     Scheduled Medications: . aspirin EC  81 mg Oral Daily  . atorvastatin  80 mg Oral q1800  . enoxaparin (LOVENOX) injection  30 mg Subcutaneous Q24H  . hydrALAZINE  12.5 mg Oral Q6H  . isosorbide mononitrate  30 mg Oral Daily  . metolazone  5 mg Oral BID  . potassium chloride  20 mEq Oral Daily  . sodium chloride  10-40 mL Intracatheter Q12H  . sodium chloride  3 mL Intravenous Q12H    Infusions: . furosemide (LASIX) infusion 15 mg/hr (01/13/14 0227)  . milrinone 0.25 mcg/kg/min (01/13/14 0224)    PRN Medications: sodium chloride, acetaminophen, ondansetron (ZOFRAN) IV, sodium chloride, sodium chloride   Assessment/Plan   1) A/C systolic HF: NICM, EF 20% (12/2013). Mr. Cresto is known to the HF team and was recently admitted 01/03/14 for A/C systolic HF and was placed on lasix gtt and milrinone for diuresis. Unfortunately he signed out AMA on Sunday d/t family issues. Presented back to the ED today after syncopal episode after emesis along with having increased SOB. Pro-BNP elevated and volume  overloaded.  - BB on hold with low output. - Weight down 5 lbs and good UOP. Continue lasix gtt at 15 mg/hr and metolazone 5 mg BID. Will supplement K+ - no ACE-I or Cleda Daub with CKD. - Continue IMDUR and hydralaizine for afterload reduction.  - Awaiting co-ox, likely will need to go up on milrinone and continue to follow CVPs and co-oxs. [facility] transfer from [facility] to 2H stepdown.  - Dietician consult pending for HF education. 2) OSA/OHS- Will continue bipap at night  3) AKI on CKD: Cr stable, but above baseline. Will continue to follow and follow Cr.   4) Syncope: In the setting of violent vomiting. Doubt arrhythmic, probably vagal medially. Monitor  on telemetry. If he can be stabilized, will be candidate for ICD (borderline for CRT with QRS about 125 msec in LBBB pattern).   Dr. Gala RomneyBensimhon had lengthy discussion with patient and wife about his HF disease and prognosis yesterday. He explained in depth he is currently not a transplant or LVAD candidate. He discussed he maybe LVAD candidate if Cr improves, however he will need to prove compliance.   Length of Stay: 2 Aundria RudCosgrove, Ali B NP-C 01/13/2014, 7:25 AM  Advanced Heart Failure Team Pager [phone] (M-F; 7a - 4p)  Please contact [facility] for night-coverage after hours (4p -7a ) and weekends on [email or web address]  Patient seen with NP, agree with the above note.  Good diuresis yesterday, CVP still 20.  Co-ox remains low.  Creatinine stable.  - Increase milrinone to 0.375 today - Continue Lasix gtt/metolazone.  - I suspect that he is going to need home milrinone due to low output.  Currently not LVAD/transpant candidate.  Possible future LVAD candidate with compliance, weight loss, and creatinine improvement.   Marca AnconaDalton McLean 01/13/2014 8:35 AM

## 2014-01-13 NOTE — Progress Notes (Signed)
[facility]  Patient Status:  Mr. Dobberstein is a new pt for [facility] this admission.  [facility] is providing the following services: HHRN and home infusion pharmacy services for home Milrinone upon DC.  [facility] Infusion Coordinator will support in hospital pre discharge teaching regarding POC for home Milrinone.  [facility] will follow pt and support DC home when deemed appropriate.  If patient discharges after hours, please call [phone].   [name] 01/13/2014, 6:21 PM

## 2014-01-13 NOTE — Evaluation (Signed)
Physical Therapy One Time Evaluation Patient Details Name: Steven Wyatt MRN: [id number] DOB: [birth year] Today's Date: 01/13/2014 Time: 0388-8280 PT Time Calculation (min): 17 min  PT Assessment / Plan / Recommendation History of Present Illness  Pt admit with CHF.    Clinical Impression  Pt admitted with CHF. Pt currently without significant functional limitations with pt being adamant he knows his body and he is at his baseline status.  Pt will not need skilled PT at this time.      PT Assessment  Patent does not need any further PT services    Follow Up Recommendations  No PT follow up                Equipment Recommendations  None recommended by PT               Precautions / Restrictions Precautions Precautions: None Restrictions Weight Bearing Restrictions: No   Pertinent Vitals/Pain VSS, no pain      Mobility  Bed Mobility Overal bed mobility: Independent Transfers Overall transfer level: Independent Ambulation/Gait General Gait Details: Pt reports he gets around in room independently.  Feels he is at his baseline.   Declined walking with PT.  States he does not need PT.     Exercises General Exercises - Lower Extremity Long Arc Quad: AROM;Both;5 reps;Seated      PT Goals(Current goals can be found in the care plan section)  N/A  Visit Information  Last PT Received On: 01/13/14 Assistance Needed: +1 History of Present Illness: Pt admit with CHF.         Prior Functioning  Home Living Family/patient expects to be discharged to:: Private residence Living Arrangements: Spouse/significant other;Children Available Help at Discharge: Family;Available PRN/intermittently Type of Home: Apartment Home Access: Level entry Home Layout: One level Home Equipment: None Additional Comments: Pt on disability Prior Function Level of Independence: Independent Communication Communication: No difficulties Dominant Hand: Right    Cognition   Cognition Arousal/Alertness: Awake/alert Behavior During Therapy: WFL for tasks assessed/performed Overall Cognitive Status: Within Functional Limits for tasks assessed    Extremity/Trunk Assessment Upper Extremity Assessment Upper Extremity Assessment: Defer to OT evaluation Lower Extremity Assessment Lower Extremity Assessment: Generalized weakness Cervical / Trunk Assessment Cervical / Trunk Assessment: Normal   Balance General Comments General comments (skin integrity, edema, etc.): Pt with very long toenails and dry skin with skin particles lying on the floor.  Edema noted all 4 extremities.  Got pt a pair of large gray socks.    End of Session PT - End of Session Activity Tolerance: Patient tolerated treatment well Patient left: in bed;with call bell/phone within reach (sitting on side of bed) Nurse Communication: Mobility status      Wyatt,Steven Graig 01/13/2014, 10:09 AM [facility] [phone] (pager)

## 2014-01-13 NOTE — Progress Notes (Signed)
Placed pt. On BIPAP 20/14 per home settings with 4L O2 bled in via nasal mask. Pt. Is tolerating BIPAP well at this time without any complications.

## 2014-01-14 LAB — CARBOXYHEMOGLOBIN
Carboxyhemoglobin: 1.7 % — ABNORMAL HIGH (ref 0.5–1.5)
Methemoglobin: 0.9 % (ref 0.0–1.5)
O2 Saturation: 51.6 %
Total hemoglobin: 9.7 g/dL — ABNORMAL LOW (ref 13.5–18.0)

## 2014-01-14 LAB — BASIC METABOLIC PANEL
BUN: 43 mg/dL — AB (ref 6–23)
CO2: 30 mEq/L (ref 19–32)
Calcium: 9.3 mg/dL (ref 8.4–10.5)
Chloride: 92 mEq/L — ABNORMAL LOW (ref 96–112)
Creatinine, Ser: 1.84 mg/dL — ABNORMAL HIGH (ref 0.50–1.35)
GFR, EST AFRICAN AMERICAN: 53 mL/min — AB (ref >90)
GFR, EST NON AFRICAN AMERICAN: 46 mL/min — AB (ref >90)
Glucose, Bld: 122 mg/dL — ABNORMAL HIGH (ref 70–99)
POTASSIUM: 3.7 meq/L (ref 3.7–5.3)
Sodium: 138 mEq/L (ref 137–147)

## 2014-01-14 NOTE — Progress Notes (Signed)
Pt had placed self on bipap when RT checked on patient.  Tolerating well at this time.  RT will continue to monitor.

## 2014-01-14 NOTE — Progress Notes (Signed)
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 He recently moved here from [place] about a month ago and has been living in a extended motel d/t no housing.   Recently admitted 1/13-1/18 for A/C HF. He was placed on IV lasix gtt and milrinone, however signed out AMA. During his admission his weight decreased from 405 to 368 lbs. ECHO showed EF 20% with diff HK, grade II DD, mod pulm HTN and mod sev LAE. His Cr bumped to 1.56.  Admitted 1/21 after presenting to the ED after syncopal episode while vomiting and dislocated his shoulder. Found to also be massively volume overloaded (wt 383).   PICC placed. 1/23 moved to SDU to follow co-ox and CVP more closely.  On lasix gtt 15 mg/hr, milrinone 0.375 and metolazone 5 mg daily. Weight down 10  lbs and 24 hr I/O -3 liters. Cr improved 2.3 -> 1.8. Co-ox remains marginal at 52%. CVP measured personally 25  Objective:   Weight Range:  Vital Signs:   Temp:  [97.4 F (36.3 C)-98.6 F (37 C)] 98.2 F (36.8 C) (01/24 0802) Pulse Rate:  [98-117] 98 (01/24 0802) Resp:  [14-23] 15 (01/24 0802) BP: (96-128)/(55-103) 99/57 mmHg (01/24 0802) SpO2:  [92 %-100 %] 97 % (01/24 0802) Weight:  [167.1 kg (368 lb 6.2 oz)] 167.1 kg (368 lb 6.2 oz) (01/24 0543) Last BM Date: 01/13/14  Weight change: Filed Weights   01/12/14 0500 01/13/14 0400 01/14/14 0543  Weight: 174 kg (383 lb 9.6 oz) 171.9 kg (378 lb 15.5 oz) 167.1 kg (368 lb 6.2 oz)    Intake/Output:   Intake/Output Summary (Last 24 hours) at 01/14/14 0851 Last data filed at 01/14/14 0600  Gross per 24 hour  Intake 1654.33 ml  Output   3801 ml  Net -2146.67 ml     Physical Exam: CVP 21-25 General: NAD, obese, SOB with conversation  HEENT: normal  Neck: supple. JVP up. Carotids 2+  bilat; no bruits. No lymphadenopathy or thryomegaly appreciated.  Cor: PMI nondisplaced. Regular rate & rhythm. No rubs, gallops or murmurs.  Lungs: clear  Abdomen: obese soft, nontender, +distended. No hepatosplenomegaly. No bruits or masses. Good bowel sounds.  Extremities: no cyanosis, clubbing, rash, bilateral woody edema up to thigh (improving) Neuro: alert & orientedx3, cranial nerves grossly intact. moves all 4 extremities w/o difficulty. Affect pleasant  Telemetry: ST 100-110  Labs: Basic Metabolic Panel:  Recent Labs Lab 01/08/14 0500 01/11/14 1121 01/12/14 0316 01/13/14 0450 01/14/14 0345  NA 136* 132* 133* 136* 138  K 4.0 4.3 4.7 3.5* 3.7  CL 89* 84* 86* 91* 92*  CO2 34* 24 24 30 30   GLUCOSE 109* 135* 133* 173* 122*  BUN 33* 42* 50* 46* 43*  CREATININE 1.56* 2.02* 2.21* 2.29* 1.84*  CALCIUM 9.1 9.3 9.3 9.0 9.3  MG 1.7  --   --   --   --     Liver Function Tests:  Recent Labs Lab 01/11/14 1121  AST 41*  ALT 40  ALKPHOS 173*  BILITOT 2.4*  PROT 8.7*  ALBUMIN 3.8   No results found for this basename: LIPASE, AMYLASE,  in the last 168 hours No results found for this basename: AMMONIA,  in the last 168 hours  CBC:  Recent  Labs Lab 01/11/14 1121  WBC 7.0  HGB 9.7*  HCT 28.9*  MCV 76.3*  PLT 327    Cardiac Enzymes: No results found for this basename: CKTOTAL, CKMB, CKMBINDEX, TROPONINI,  in the last 168 hours  BNP: BNP (last 3 results)  Recent Labs  01/03/14 1248 01/11/14 1121  PROBNP 1164.0* 2156.0*    Imaging: No results found.   Medications:     Scheduled Medications: . aspirin EC  81 mg Oral Daily  . atorvastatin  80 mg Oral q1800  . enoxaparin (LOVENOX) injection  80 mg Subcutaneous Q24H  . hydrALAZINE  12.5 mg Oral Q8H  . isosorbide mononitrate  30 mg Oral Daily  . metolazone  5 mg Oral Daily  . potassium chloride  40 mEq Oral BID  . sodium chloride  10-40 mL Intracatheter Q12H  . sodium chloride  3 mL Intravenous Q12H     Infusions: . furosemide (LASIX) infusion 15 mg/hr (01/13/14 1932)  . milrinone 0.375 mcg/kg/min (01/14/14 0737)    PRN Medications: sodium chloride, acetaminophen, ondansetron (ZOFRAN) IV, sodium chloride, sodium chloride   Assessment/Plan   1) A/C systolic HF: NICM, EF 20% (12/2013). With RV dysfunction. Readmitted with low output HF and massive volume overload.  - Co-ox remains marginal on milrinone. However he is diuresing well and renal function improving so will not push does today.  Continue lasix gtt at 15 mg/hr and metolazone 5 mg BID. Will supplement K+ - no ACE-I or Cleda DaubSpiro with CKD. No BB due to low output. - Continue IMDUR and hydralaizine for afterload reduction. BP too soft to titrate.  - Will need home milrinone - Has received extensive dietary instruction - Will eventually need ICD once meds optimized. Consider LifeVest - Currently not LVAD/transpant candidate.  Possible future LVAD candidate with compliance, weight loss, and RV & renal  improvement.  2) OSA/OHS- Will continue bipap at night  3) AKI on CKD, stage III - improving  Steven BodilyDaniel BensimhonMD 01/14/2014 8:51 AM

## 2014-01-15 LAB — BASIC METABOLIC PANEL
BUN: 42 mg/dL — ABNORMAL HIGH (ref 6–23)
CO2: 31 mEq/L (ref 19–32)
Calcium: 9 mg/dL (ref 8.4–10.5)
Chloride: 89 mEq/L — ABNORMAL LOW (ref 96–112)
Creatinine, Ser: 1.75 mg/dL — ABNORMAL HIGH (ref 0.50–1.35)
GFR calc Af Amer: 57 mL/min — ABNORMAL LOW (ref >90)
GFR calc non Af Amer: 49 mL/min — ABNORMAL LOW (ref >90)
Glucose, Bld: 143 mg/dL — ABNORMAL HIGH (ref 70–99)
POTASSIUM: 3.3 meq/L — AB (ref 3.7–5.3)
SODIUM: 134 meq/L — AB (ref 137–147)

## 2014-01-15 LAB — CARBOXYHEMOGLOBIN
CARBOXYHEMOGLOBIN: 1.9 % — AB (ref 0.5–1.5)
METHEMOGLOBIN: 0.7 % (ref 0.0–1.5)
O2 Saturation: 58.6 %
Total hemoglobin: 9.6 g/dL — ABNORMAL LOW (ref 13.5–18.0)

## 2014-01-15 MED ORDER — POTASSIUM CHLORIDE CRYS ER 20 MEQ PO TBCR
60.0000 meq | EXTENDED_RELEASE_TABLET | Freq: Two times a day (BID) | ORAL | Status: DC
  Administered 2014-01-15 (×2): 60 meq via ORAL
  Filled 2014-01-15 (×3): qty 3

## 2014-01-15 MED ORDER — METOLAZONE 5 MG PO TABS
5.0000 mg | ORAL_TABLET | Freq: Two times a day (BID) | ORAL | Status: DC
Start: 2014-01-15 — End: 2014-01-16
  Administered 2014-01-15 – 2014-01-16 (×3): 5 mg via ORAL
  Filled 2014-01-15 (×5): qty 1

## 2014-01-15 NOTE — Progress Notes (Signed)
Patient ID: Steven Wyatt, male   DOB: 1976/09/28, 38 y.o.   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 He recently moved here from [place] about a month ago and has been living in a extended motel d/t no housing.   Recently admitted 1/13-1/18 for A/C HF. He was placed on IV lasix gtt and milrinone, however signed out AMA. During his admission his weight decreased from 405 to 368 lbs. ECHO showed EF 20% with diff HK, grade II DD, mod pulm HTN and mod sev LAE. His Cr bumped to 1.56.  Admitted 1/21 after presenting to the ED after syncopal episode while vomiting and dislocated his shoulder. Found to also be massively volume overloaded (wt 383).   PICC placed. 1/23 moved to SDU to follow co-ox and CVP more closely.  On lasix gtt 15 mg/hr, milrinone 0.375 and metolazone 5 mg daily. Weight down another 4  lbs and 24 hr I/O -1.5 liters. Cr improved 2.3 -> 1.75. Co-ox remains marginal at 59%. CVP measured personally 22  Objective:   Weight Range:  Vital Signs:   Temp:  [97.7 F (36.5 C)-98.6 F (37 C)] 98.1 F (36.7 C) (01/25 0400) Pulse Rate:  [72-108] 108 (01/24 2210) Resp:  [15-20] 18 (01/24 2210) BP: (99-125)/(52-93) 104/71 mmHg (01/25 0503) SpO2:  [93 %-99 %] 93 % (01/25 0400) Weight:  [364 lb 6.7 oz (165.3 kg)] 364 lb 6.7 oz (165.3 kg) (01/25 0400) Last BM Date: 01/14/14  Weight change: Filed Weights   01/13/14 0400 01/14/14 0543 01/15/14 0400  Weight: 378 lb 15.5 oz (171.9 kg) 368 lb 6.2 oz (167.1 kg) 364 lb 6.7 oz (165.3 kg)    Intake/Output:   Intake/Output Summary (Last 24 hours) at 01/15/14 0746 Last data filed at 01/15/14 0700  Gross per 24 hour  Intake   1995 ml  Output   3500 ml  Net  -1505 ml     Physical Exam: CVP 22 General:  NAD, obese, SOB with conversation  HEENT: normal  Neck: Thick. JVP up. Carotids 2+ bilat; no bruits. No lymphadenopathy or thryomegaly appreciated.  Cor: PMI nondisplaced. Regular rate & rhythm. No rubs, gallops or murmurs.  Lungs: clear  Abdomen: obese soft, nontender, +distended. No hepatosplenomegaly. No bruits or masses. Good bowel sounds.  Extremities: no cyanosis, clubbing, rash, bilateral woody edema up to thigh (improving) Neuro: alert & orientedx3, cranial nerves grossly intact. moves all 4 extremities w/o difficulty. Affect pleasant  Telemetry: ST 100-110  Labs: Basic Metabolic Panel:  Recent Labs Lab 01/11/14 1121 01/12/14 0316 01/13/14 0450 01/14/14 0345 01/15/14 0421  NA 132* 133* 136* 138 134*  K 4.3 4.7 3.5* 3.7 3.3*  CL 84* 86* 91* 92* 89*  CO2 24 24 30 30 31   GLUCOSE 135* 133* 173* 122* 143*  BUN 42* 50* 46* 43* 42*  CREATININE 2.02* 2.21* 2.29* 1.84* 1.75*  CALCIUM 9.3 9.3 9.0 9.3 9.0    Liver Function Tests:  Recent Labs Lab 01/11/14 1121  AST 41*  ALT 40  ALKPHOS 173*  BILITOT 2.4*  PROT 8.7*  ALBUMIN 3.8   No results found for this basename: LIPASE, AMYLASE,  in the last 168 hours No results found for this basename: AMMONIA,  in the last 168  hours  CBC:  Recent Labs Lab 01/11/14 1121  WBC 7.0  HGB 9.7*  HCT 28.9*  MCV 76.3*  PLT 327    Cardiac Enzymes: No results found for this basename: CKTOTAL, CKMB, CKMBINDEX, TROPONINI,  in the last 168 hours  BNP: BNP (last 3 results)  Recent Labs  01/03/14 1248 01/11/14 1121  PROBNP 1164.0* 2156.0*    Imaging: No results found.   Medications:     Scheduled Medications: . aspirin EC  81 mg Oral Daily  . atorvastatin  80 mg Oral q1800  . enoxaparin (LOVENOX) injection  80 mg Subcutaneous Q24H  . hydrALAZINE  12.5 mg Oral Q8H  . isosorbide mononitrate  30 mg Oral Daily  . metolazone  5 mg Oral BID  . potassium chloride  60 mEq Oral BID  . sodium chloride  10-40 mL  Intracatheter Q12H  . sodium chloride  3 mL Intravenous Q12H    Infusions: . furosemide (LASIX) infusion 15 mg/hr (01/15/14 0107)  . milrinone 0.375 mcg/kg/min (01/15/14 0107)    PRN Medications: sodium chloride, acetaminophen, ondansetron (ZOFRAN) IV, sodium chloride, sodium chloride   Assessment/Plan   1) A/C systolic HF: NICM, EF 20% (12/2013). With RV dysfunction. Readmitted with low output HF and massive volume overload.  - Co-ox remains marginal on milrinone. However he is diuresing well and renal function improving so will not push does today.  Continue lasix gtt at 15 mg/hr and metolazone 5 mg BID (increase from daily to see if we can get more vigorous UOP today). Will supplement K+ - no ACE-I or Cleda DaubSpiro with CKD. No BB due to low output. - Continue IMDUR and hydralaizine for afterload reduction. BP too soft to titrate.  - Will need home milrinone - Has received extensive dietary instruction - Will eventually need ICD once meds optimized. Consider LifeVest - Currently not LVAD/transpant candidate.  Possible future LVAD candidate with compliance, weight loss, and RV & renal  improvement.  2) OSA/OHS- Will continue bipap at night  3) AKI on CKD, stage III - improving on milrinone and with diuresis.   Dalton [facility] GrapevineMcLeanMD 01/15/2014 7:46 AM

## 2014-01-16 DIAGNOSIS — Z6841 Body Mass Index (BMI) 40.0 and over, adult: Secondary | ICD-10-CM

## 2014-01-16 LAB — CBC
HCT: 31.7 % — ABNORMAL LOW (ref 39.0–52.0)
Hemoglobin: 10.5 g/dL — ABNORMAL LOW (ref 13.0–17.0)
MCH: 24.8 pg — ABNORMAL LOW (ref 26.0–34.0)
MCHC: 33.1 g/dL (ref 30.0–36.0)
MCV: 74.8 fL — ABNORMAL LOW (ref 78.0–100.0)
PLATELETS: 372 10*3/uL (ref 150–400)
RBC: 4.24 MIL/uL (ref 4.22–5.81)
RDW: 17.8 % — AB (ref 11.5–15.5)
WBC: 7 10*3/uL (ref 4.0–10.5)

## 2014-01-16 LAB — CARBOXYHEMOGLOBIN
Carboxyhemoglobin: 2.2 % — ABNORMAL HIGH (ref 0.5–1.5)
METHEMOGLOBIN: 1.6 % — AB (ref 0.0–1.5)
O2 SAT: 64.1 %
Total hemoglobin: 10.8 g/dL — ABNORMAL LOW (ref 13.5–18.0)

## 2014-01-16 LAB — BASIC METABOLIC PANEL
BUN: 39 mg/dL — ABNORMAL HIGH (ref 6–23)
CALCIUM: 10.1 mg/dL (ref 8.4–10.5)
CO2: 32 mEq/L (ref 19–32)
Chloride: 88 mEq/L — ABNORMAL LOW (ref 96–112)
Creatinine, Ser: 1.65 mg/dL — ABNORMAL HIGH (ref 0.50–1.35)
GFR calc Af Amer: 61 mL/min — ABNORMAL LOW (ref >90)
GFR calc non Af Amer: 52 mL/min — ABNORMAL LOW (ref >90)
Glucose, Bld: 130 mg/dL — ABNORMAL HIGH (ref 70–99)
Potassium: 3.5 mEq/L — ABNORMAL LOW (ref 3.7–5.3)
Sodium: 135 mEq/L — ABNORMAL LOW (ref 137–147)

## 2014-01-16 MED ORDER — POTASSIUM CHLORIDE CRYS ER 20 MEQ PO TBCR: 20.0000 meq | EXTENDED_RELEASE_TABLET | Freq: Two times a day (BID) | ORAL | Status: DC

## 2014-01-16 MED ORDER — ISOSORBIDE MONONITRATE ER 30 MG PO TB24: 30.0000 mg | ORAL_TABLET | Freq: Every day | ORAL | Status: DC

## 2014-01-16 MED ORDER — METOLAZONE 5 MG PO TABS: 5.0000 mg | ORAL_TABLET | ORAL | Status: DC | PRN

## 2014-01-16 MED ORDER — DIGOXIN 125 MCG PO TABS: 0.1250 mg | ORAL_TABLET | Freq: Every day | ORAL | Status: DC

## 2014-01-16 MED ORDER — POTASSIUM CHLORIDE CRYS ER 20 MEQ PO TBCR
40.0000 meq | EXTENDED_RELEASE_TABLET | Freq: Three times a day (TID) | ORAL | Status: DC
  Administered 2014-01-16 (×2): 40 meq via ORAL
  Filled 2014-01-16 (×2): qty 2

## 2014-01-16 MED ORDER — MILRINONE IN DEXTROSE 20 MG/100ML IV SOLN: 0.3750 ug/kg/min | INTRAVENOUS | Status: DC

## 2014-01-16 MED ORDER — DIGOXIN 125 MCG PO TABS
0.1250 mg | ORAL_TABLET | Freq: Every day | ORAL | Status: DC
  Administered 2014-01-16: 0.125 mg via ORAL
  Filled 2014-01-16: qty 1

## 2014-01-16 MED ORDER — BUMETANIDE 2 MG PO TABS: 4.0000 mg | ORAL_TABLET | Freq: Two times a day (BID) | ORAL | Status: DC

## 2014-01-16 MED ORDER — BUMETANIDE 2 MG PO TABS
4.0000 mg | ORAL_TABLET | Freq: Two times a day (BID) | ORAL | Status: DC
  Administered 2014-01-16: 4 mg via ORAL
  Filled 2014-01-16 (×3): qty 2

## 2014-01-16 MED ORDER — DEXTROSE 5 % IV SOLN
15.0000 mg/h | INTRAVENOUS | Status: AC
  Administered 2014-01-16: 15 mg/h via INTRAVENOUS
  Filled 2014-01-16: qty 25

## 2014-01-16 MED ORDER — HYDRALAZINE HCL 25 MG PO TABS: 12.5000 mg | ORAL_TABLET | Freq: Three times a day (TID) | ORAL | Status: DC

## 2014-01-16 NOTE — Progress Notes (Signed)
Patient placed self on BIPAP. Tolerating well at this time.

## 2014-01-16 NOTE — Discharge Summary (Signed)
Advanced Heart Failure Team  Discharge Summary   Patient ID: Steven Wyatt MRN: [id number], DOB/AGE: [birth year] 38 y.o. Admit date: 01/11/2014 D/C date:     01/16/2014   Primary Discharge Diagnoses:  1. A/C Systolic Heart Failure EF 20% [facility] nor cors 2014. Inotrope dependent Milrinone 0.375 mcg via PICC 2. OSA- on nightly BiPap 3. AKI  Or CKD ----creatinine baseline 1.6 4. 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 He recently moved here from [place] about a month ago and has been living in a extended motel d/t no housing.   Recently admitted 1/13-1/18/15 for A/C HF. He was placed on IV lasix gtt and milrinone, however signed out AMA. During his admission his weight decreased from 405 to 368 lbs. ECHO showed EF 20% with diff HK, grade II DD, mod pulm HTN and mod sev LAE. His Cr bumped to 1.56. CO-OX when he left AMA was 35% on 01/08/14.   He was readmitted 01/11/14 after he had a syncopal episode due to vomiting and massive volume overload. PICC line was placed to follow CVPs with initial CVP 25. He was felt to be inotrope dependent. Milrinone was restarted at 0.375 mcg and he was diuresed with lasix drip as well as metolazone. As his volume status improved he was transitioned to bumex 4 mg po twice a day. Overall he diuresed 26 pounds. He will continue on low dose hydralazine/Imdur. He was not be placed on Ace/Spiro due to CKD. He is not currently LVAD/transplant candidate due to obesity, noncomplinace, RV dysfunction, and CKD.  He was able to walk 390 feet with mild dyspnea. Referred to cardiac rehab for phase II. While hospitalized he received extensive education on low diet, limiting fluids to < 2 liters per day, medications compliance, and daily weights. [facility] to follow for home milrinone, picc line, and weekly labs. He will continue to be followed closely in the HF  clinic with follow February 2 at 11:40.       Discharge Weight Range: 357 pounds. Discharge Vitals: Blood pressure 123/86, pulse 106, temperature 98.1 F (36.7 C), temperature source Oral, resp. rate 22, height 5\' 9"  (1.753 m), weight 357 lb 12.9 oz (162.3 kg), SpO2 99.00%.  Labs: Lab Results  Component Value Date   WBC 7.0 01/16/2014   HGB 10.5* 01/16/2014   HCT 31.7* 01/16/2014   MCV 74.8* 01/16/2014   PLT 372 01/16/2014    Recent Labs Lab 01/11/14 1121  01/16/14 0400  NA 132*  < > 135*  K 4.3  < > 3.5*  CL 84*  < > 88*  CO2 24  < > 32  BUN 42*  < > 39*  CREATININE 2.02*  < > 1.65*  CALCIUM 9.3  < > 10.1  PROT 8.7*  --   --   BILITOT 2.4*  --   --   ALKPHOS 173*  --   --   ALT 40  --   --   AST 41*  --   --   GLUCOSE 135*  < > 130*  < > = values in this interval not displayed. No results found for this basename: CHOL, HDL, LDLCALC, TRIG   BNP (last 3 results)  Recent Labs  01/03/14 1248 01/11/14 1121  PROBNP 1164.0* 2156.0*    Diagnostic Studies/Procedures   No results found.  Discharge Medications     Medication List         albuterol 108 (90 BASE) MCG/ACT inhaler  Commonly known as:  PROVENTIL HFA;VENTOLIN HFA  Inhale 2 puffs into the lungs daily as needed for wheezing or shortness of breath.     aspirin EC 81 MG tablet  Take 81 mg by mouth daily.     atorvastatin 80 MG tablet  Commonly known as:  LIPITOR  Take 80 mg by mouth daily.     bumetanide 2 MG tablet  Commonly known as:  BUMEX  Take 2 tablets (4 mg total) by mouth 2 (two) times daily.     digoxin 0.125 MG tablet  Commonly known as:  LANOXIN  Take 1 tablet (0.125 mg total) by mouth daily.     hydrALAZINE 25 MG tablet  Commonly known as:  APRESOLINE  Take 0.5 tablets (12.5 mg total) by mouth every 8 (eight) hours.     isosorbide mononitrate 30 MG 24 hr tablet  Commonly known as:  IMDUR  Take 1 tablet (30 mg total) by mouth daily.     metolazone 5 MG tablet  Commonly known as:   ZAROXOLYN  Take 1 tablet (5 mg total) by mouth as needed. Weight gain 3 pounds in 24 hours     milrinone 20 MG/100ML Soln infusion  Commonly known as:  PRIMACOR  Inject 62.7375 mcg/min into the vein continuous.     potassium chloride SA 20 MEQ tablet  Commonly known as:  K-DUR,KLOR-CON  Take 1 tablet (20 mEq total) by mouth 2 (two) times daily.        Disposition   The patient will be discharged in stable condition to home.     Discharge Orders   Future Appointments Provider Department Dept Phone   01/23/2014 11:40 AM [facility] [phone]   Future Orders Complete By Expires   Amb Referral to Cardiac Rehabilitation  As directed      Follow-up Information   Follow up with Arvilla Meres, MD.   Specialty:  Cardiology   Contact information:   [address] [place] [address] [phone]       Follow up On 01/23/2014. (At 11: 40 Garage Code 0300)         Duration of Discharge Encounter: Greater than 35 minutes   Signed, CLEGG,AMY  01/16/2014, 4:20 PM  Patient seen and examined with Tonye Becket, NP. We discussed all aspects of the encounter. I agree with the assessment and plan as stated above.   Much improved with IV diuresis and inotrope support. Extensive HF education provided. Agree with d/c today. Will follow closely as outpt.  Truman Hayward 6:48 PM

## 2014-01-16 NOTE — Progress Notes (Signed)
Pt d/c'd home with Milrinone, Pump, and picc. Pt verbalizes understanding of all d/c instructions. Pt states he will follow up with MD as recommended. Denies cp, sob, or any other difficulties. PICC dressing changed and pt on home milrinone pump. All belongings with pt.

## 2014-01-16 NOTE — Discharge Instructions (Signed)
Heart Failure Heart failure means your heart has trouble pumping blood. This makes it hard for your body to work well. Heart failure is usually a long-term (chronic) condition. You must take good care of yourself and follow your doctor's treatment plan. HOME CARE  Take your heart medicine as told by your doctor.  Do not stop taking medicine unless your doctor tells you to.  Do not skip any dose of medicine.  Refill your medicines before they run out.  Take other medicines only as told by your doctor or pharmacist.  Stay active if told by your doctor. The elderly and people with severe heart failure should talk with a doctor about physical activity.  Eat heart healthy foods. Choose foods that are without trans fat and are low in saturated fat, cholesterol, and salt (sodium). This includes fresh or frozen fruits and vegetables, fish, lean meats, fat-free or low-fat dairy foods, whole grains, and high-fiber foods. Lentils and dried peas and beans (legumes) are also good choices.  Limit salt if told by your doctor.  Cook in a healthy way. Roast, grill, broil, bake, poach, steam, or stir-fry foods.  Limit fluids as told by your doctor.  Weigh yourself every morning. Do this after you pee (urinate) and before you eat breakfast. Write down your weight to give to your doctor.  Take your blood pressure and write it down if your doctor tell you to.  Ask your doctor how to check your pulse. Check your pulse as told.  Lose weight if told by your doctor.  Stop smoking or chewing tobacco. Do not use gum or patches that help you quit without your doctor's approval.  Schedule and go to doctor visits as told.  Nonpregnant women should have no more than 1 drink a day. Men should have no more than 2 drinks a day. Talk to your doctor about drinking alcohol.  Stop illegal drug use.  Stay current with shots (immunizations).  Manage your health conditions as told by your doctor.  Learn to manage  your stress.  Rest when you are tired.  If it is really hot outside:  Avoid intense activities.  Use air conditioning or fans, or get in a cooler place.  Avoid caffeine and alcohol.  Wear loose-fitting, lightweight, and light-colored clothing.  If it is really cold outside:  Avoid intense activities.  Layer your clothing.  Wear mittens or gloves, a hat, and a scarf when going outside.  Avoid alcohol.  Learn about heart failure and get support as needed.  Get help to maintain or improve your quality of life and your ability to care for yourself as needed. GET HELP IF:   You gain 03 lb/1.4 kg or more in 1 day or 05 lb/2.3 kg in a week.  You are more short of breath than usual.  You cannot do your normal activities.  You tire easily.  You cough more than normal, especially with activity.  You have any or more puffiness (swelling) in areas such as your hands, feet, ankles, or belly (abdomen).  You cannot sleep because it is hard to breathe.  You feel like your heart is beating fast (palpitations).  You get dizzy or lightheaded when you stand up. GET HELP RIGHT AWAY IF:   You have trouble breathing.  There is a change in mental status, such as becoming less alert or not being able to focus.  You have chest pain or discomfort.  You faint. MAKE SURE YOU:   Understand these   instructions.  Will watch your condition.  Will get help right away if you are not doing well or get worse. Document Released: 09/16/2008 Document Revised: 04/04/2013 Document Reviewed: 07/08/2012 Exit[employer] Patient Information 2014 [employer].  

## 2014-01-16 NOTE — Progress Notes (Signed)
Patient ID: Steven Wyatt, male   DOB: 1976-04-09, 38 y.o.   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 He recently moved [place] from [place] about a month ago and has been living in a extended motel d/t no housing.   Recently admitted 1/13-1/18 for A/C HF. He was placed on IV lasix gtt and milrinone, however signed out AMA. During his admission his weight decreased from 405 to 368 lbs. ECHO showed EF 20% with diff HK, grade II DD, mod pulm HTN and mod sev LAE. His Cr bumped to 1.56.  Admitted 1/21 after presenting to the ED after syncopal episode while vomiting and dislocated his shoulder. Found to also be massively volume overloaded (wt 383).   PICC placed. 1/23 moved to SDU to follow co-ox and CVP more closely.  On lasix gtt 15 mg/hr, milrinone 0.375 and metolazone 5 mg daily. Weight down another 7  lbs (overall 26 pounds) and 24 hr I/O -5.1 liters. Cr continues to trend down 2.3 -> 1.75>1.6 Co-ox remains marginal at 64%. CVP measured personally   Wants to go home. Denies SOB/Orthopnea    Objective:   Weight Range:  Vital Signs:   Temp:  [97.4 F (36.3 C)-98.4 F (36.9 C)] 97.7 F (36.5 C) (01/26 0405) Pulse Rate:  [74-102] 102 (01/26 0027) Resp:  [20-22] 20 (01/26 0027) BP: (90-131)/(55-99) 90/58 mmHg (01/26 0405) SpO2:  [84 %-98 %] 96 % (01/26 0600) Weight:  [357 lb 12.9 oz (162.3 kg)] 357 lb 12.9 oz (162.3 kg) (01/26 0400) Last BM Date: 01/14/14  Weight change: Filed Weights   01/14/14 0543 01/15/14 0400 01/16/14 0400  Weight: 368 lb 6.2 oz (167.1 kg) 364 lb 6.7 oz (165.3 kg) 357 lb 12.9 oz (162.3 kg)    Intake/Output:   Intake/Output Summary (Last 24 hours) at 01/16/14 0655 Last data filed at 01/16/14 0500  Gross per 24 hour  Intake  1007.4 ml  Output   6100 ml  Net -5092.6 ml     Physical Exam: CVP 11 General: NAD, obese, Sitting in recliner  HEENT: normal  Neck: Thick. JVP ~10. Carotids 2+ bilat; no bruits. No lymphadenopathy or thryomegaly appreciated.  Cor: PMI nondisplaced. Regular rate & rhythm. No rubs, gallops or murmurs.  Lungs: clear  Abdomen: obese soft, nontender, +distended. No hepatosplenomegaly. No bruits or masses. Good bowel sounds.  Extremities: no cyanosis, clubbing, rash, bilateral woody edema but improved  Neuro: alert & orientedx3, cranial nerves grossly intact. moves all 4 extremities w/o difficulty. Affect pleasant  Telemetry: ST 100-110  Labs: Basic Metabolic Panel:  Recent Labs Lab 01/12/14 0316 01/13/14 0450 01/14/14 0345 01/15/14 0421 01/16/14 0400  NA 133* 136* 138 134* 135*  K 4.7 3.5* 3.7 3.3* 3.5*  CL 86* 91* 92* 89* 88*  CO2 24 30 30 31  32  GLUCOSE 133* 173* 122* 143* 130*  BUN 50* 46* 43* 42* 39*  CREATININE 2.21* 2.29* 1.84* 1.75* 1.65*  CALCIUM 9.3 9.0 9.3 9.0 10.1    Liver Function Tests:  Recent Labs Lab 01/11/14 1121  AST 41*  ALT 40  ALKPHOS 173*  BILITOT 2.4*  PROT 8.7*  ALBUMIN 3.8   No results found for this basename: LIPASE, AMYLASE,  in the last 168 hours No  results found for this basename: AMMONIA,  in the last 168 hours  CBC:  Recent Labs Lab 01/11/14 1121 01/16/14 0400  WBC 7.0 7.0  HGB 9.7* 10.5*  HCT 28.9* 31.7*  MCV 76.3* 74.8*  PLT 327 372    Cardiac Enzymes: No results found for this basename: CKTOTAL, CKMB, CKMBINDEX, TROPONINI,  in the last 168 hours  BNP: BNP (last 3 results)  Recent Labs  01/03/14 1248 01/11/14 1121  PROBNP 1164.0* 2156.0*    Imaging: No results found.   Medications:     Scheduled Medications: . aspirin EC  81 mg Oral Daily  . atorvastatin  80 mg Oral q1800  . enoxaparin (LOVENOX) injection  80 mg Subcutaneous Q24H  . hydrALAZINE  12.5 mg Oral Q8H  . isosorbide mononitrate  30 mg  Oral Daily  . metolazone  5 mg Oral BID  . potassium chloride  60 mEq Oral BID  . sodium chloride  10-40 mL Intracatheter Q12H  . sodium chloride  3 mL Intravenous Q12H    Infusions: . furosemide (LASIX) infusion 15 mg/hr (01/15/14 0107)  . milrinone 0.375 mcg/kg/min (01/16/14 0329)    PRN Medications: sodium chloride, acetaminophen, ondansetron (ZOFRAN) IV, sodium chloride, sodium chloride   Assessment/Plan   1) A/C systolic HF: NICM, EF 20% (12/2013). With RV dysfunction. Readmitted with low output HF and massive volume overload.  - Co-ox 64% on milrinone 0.375 mcg. Brisk diuresis -5.1 liters.  Continue lasix gtt at 15 mg/hr and metolazone 5 mg BID. If he elects to go home today would switch to Bumex  4 mg twice a day.  Will supplement K+ - no ACE-I or Cleda Daub with CKD. No BB due to low output. - Continue IMDUR and hydralaizine for afterload reduction. BP too soft to titrate.  - Will need home milrinone---> [facility] set up for discharge. Will need to meet with HF SW for goals of care. This can be performed outpatient.  - Has received extensive dietary instruction - Will eventually need ICD once meds optimized.  -Consult cardiac rehab.  - Currently not LVAD/transpant candidate.  Possible future LVAD candidate with compliance, weight loss, and RV & renal  improvement.  2) OSA/OHS- Will continue bipap at night  3) AKI on CKD, stage III - improving on milrinone and with diuresis.   CLEGG,AMY NP-C 01/16/2014 6:55 AM  Patient seen and examined with Tonye Becket, NP. We discussed all aspects of the encounter. I agree with the assessment and plan as stated above.   Much improved with milrinone. Volume status getting close to euvolemic. Will d/c home today on home milrinone 0.375 and bumex. No b-blocker or ACE due to low output and renal failure. Continue low-dose hydralazine and nitrates. Start low-dose digoxin.   Daniel Bensimhon,MD 9:05 AM

## 2014-01-16 NOTE — Progress Notes (Signed)
CARDIAC REHAB PHASE I   PRE:  Rate/Rhythm: 104 ST with frequent PAC/PVCs    BP: sitting 117/83    SaO2: 95 RA  MODE:  Ambulation: 390 ft   POST:  Rate/Rhythm: 130 ST without ectopy    BP: sitting 121/100     SaO2: 97 RA  Pt with noticeable SOB toward end of walk while conversing. HR elevated. Pt sts that his SOB is much improved and he was happy how far he was able to walk. Feels well. BP elevated. Discussed daily wts, diet, exercise and CRPII. Pt voices understanding and sounds very knowledgeable. Likes to [employer] and feels he can monitor sodium in that regard. Pt very interested in CRPII and requests his name be sent to [place] [facility]. [phone]   Elissa Lovett Mayagi¼ez CES, ACSM 01/16/2014 11:11 AM

## 2014-01-17 DIAGNOSIS — I5023 Acute on chronic systolic (congestive) heart failure: Secondary | ICD-10-CM

## 2014-01-17 DIAGNOSIS — I509 Heart failure, unspecified: Secondary | ICD-10-CM

## 2014-01-17 DIAGNOSIS — I10 Essential (primary) hypertension: Secondary | ICD-10-CM

## 2014-01-17 DIAGNOSIS — Z452 Encounter for adjustment and management of vascular access device: Secondary | ICD-10-CM

## 2014-01-23 ENCOUNTER — Ambulatory Visit (HOSPITAL_COMMUNITY)
Admit: 2014-01-23 | Discharge: 2014-01-23 | Disposition: A | Discharge status: Home / Self Care | Payer: Medicare Other | Attending: Internal Medicine | Admitting: Internal Medicine

## 2014-01-23 VITALS — BP 119/82 | HR 113 | Wt 350.0 lb

## 2014-01-23 DIAGNOSIS — I129 Hypertensive chronic kidney disease with stage 1 through stage 4 chronic kidney disease, or unspecified chronic kidney disease: Secondary | ICD-10-CM | POA: Diagnosis not present

## 2014-01-23 DIAGNOSIS — I5022 Chronic systolic (congestive) heart failure: Secondary | ICD-10-CM | POA: Insufficient documentation

## 2014-01-23 DIAGNOSIS — N189 Chronic kidney disease, unspecified: Secondary | ICD-10-CM | POA: Diagnosis not present

## 2014-01-23 DIAGNOSIS — G4730 Sleep apnea, unspecified: Secondary | ICD-10-CM

## 2014-01-23 DIAGNOSIS — Z6841 Body Mass Index (BMI) 40.0 and over, adult: Secondary | ICD-10-CM

## 2014-01-23 DIAGNOSIS — I428 Other cardiomyopathies: Secondary | ICD-10-CM

## 2014-01-23 MED ORDER — HYDRALAZINE HCL 25 MG PO TABS: 25.0000 mg | ORAL_TABLET | Freq: Three times a day (TID) | ORAL | Status: AC

## 2014-01-23 NOTE — Patient Instructions (Addendum)
Follow up in 1 month in Heart Failure Clinic - 02/20/2014 at 10:00 am  Pulmonary referral at [facility] [place] [facility] - 01/24/2014 at 2:30 (Please arrive at 2:15)  EP Referral to Dr. Graciela Husbands at [facility] - 01/30/2014 at 3:30  Take hydralazine 25 mg three time a day  Do the following things EVERYDAY: 1) Weigh yourself in the morning before breakfast. Write it down and keep it in a log. 2) Take your medicines as prescribed 3) Eat low salt foods-Limit salt (sodium) to 2000 mg per day.  4) Stay as active as you can everyday 5) Limit all fluids for the day to less than 2 liters

## 2014-01-23 NOTE — Progress Notes (Signed)
Patient ID: Huey RomansWayne O Niederer, male   DOB: Mar 13, 1976, [age] y.o.   MRN: [id number]  Weight Range   Baseline proBNP     HPI: [name] is a 38 yo male with a history of NICM (nor cors Oct 2014), chronic systolic HF, morbid obesity, OSA, HTN, CKD and HLD. He recently moved here from [place] about a month ago and has been living in a extended motel d/t no housing.   Admitted 1/13-1/18/15 for A/C HF. He was placed on IV lasix gtt and milrinone, however signed out AMA. During his admission his weight decreased from 405 to 368 lbs. ECHO showed EF 20% with diff HK, grade II DD, mod pulm HTN and mod sev LAE. His Cr bumped to 1.56. CO-OX when he left AMA was 35% on 01/08/14.   Readmitted 01/11/14 after he had syncopal episode.  Diuresed with lasix drip and metolazone. He was transitioned to bumex 4 mg twice a day. He was not be placed on Ace/Spiro due to CKD. He is not currently LVAD/transplant candidate due to obesity, noncomplinace, RV dysfunction, and CKD. Discharged on home Mil[facility] at 0.375 mcg [facility]. Discharge weight 357 pounds.   He returns for post hospital follow up.Overall he is feeling great.  Denies SOB/PND. +Orthopnea sleeps on  2 pillows. Uses BiPap nightly. Weight at home trending down from 358 to 348 pounds. Has all medications. [facility].  Has bought elliptical.   Labs 01/15/14 K 3.3 Creatinine 1.75  Labs 01/16/14 K 3.5 Creatinine 1.65   ECG: NSR, LBBB with QRS 146 msec   FH: No CHF, no SCD that he knows of.  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 Lives with his wife and 2 sons, has 3 other kids.  Moved from [place].   ROS: All systems negative except as listed in HPI, PMH and Problem List.  PMH: 1. Nonischemic cardiomyopathy: Initial workup in [place].  LHC (10/14) with normal coronaries.  Echo (1/15) with EF 20%, mild diffuse hypokinesis, moderately dilated LV, moderately dilated RV with moderately decreased systolic function, PA systolic  pressure 59 mmHg.  1/15 low output heart failure, milrinone begun.  2. Morbid obesity 3. OHS/OSA on Bipap.  4. HTN 5. Hyperlipidemia 6. CKD: Suspect cardiorenal syndrome 7. Syncope: Vasovagal in setting of vomiting 8. Type II diabetes: Borderline.   Current Outpatient Prescriptions  Medication Sig Dispense Refill  . albuterol (PROVENTIL HFA;VENTOLIN HFA) 108 (90 BASE) MCG/ACT inhaler Inhale 2 puffs into the lungs daily as needed for wheezing or shortness of breath.      Marland Kitchen. aspirin EC 81 MG tablet Take 81 mg by mouth daily.      Marland Kitchen. atorvastatin (LIPITOR) 80 MG tablet Take 80 mg by mouth daily.      . bumetanide (BUMEX) 2 MG tablet Take 2 tablets (4 mg total) by mouth 2 (two) times daily.  120 tablet  6  . digoxin (LANOXIN) 0.125 MG tablet Take 1 tablet (0.125 mg total) by mouth daily.  30 tablet  6  . hydrALAZINE (APRESOLINE) 25 MG tablet Take 0.5 tablets (12.5 mg total) by mouth every 8 (eight) hours.  45 tablet  6  . isosorbide mononitrate (IMDUR) 30 MG 24 hr tablet Take 1 tablet (30 mg total) by mouth daily.  30 tablet  6  . metolazone (ZAROXOLYN) 5 MG tablet Take 1 tablet (5 mg total) by mouth as needed. Weight gain 3 pounds in 24 hours  8 tablet  6  .  milrinone (PRIMACOR) 20 MG/100ML SOLN infusion Inject 62.7375 mcg/min into the vein continuous.  100 mL  6  . potassium chloride SA (K-DUR,KLOR-CON) 20 MEQ tablet Take 1 tablet (20 mEq total) by mouth 2 (two) times daily.  60 tablet  6   No current facility-administered medications for this encounter.     PHYSICAL EXAM: Filed Vitals:   01/23/14 1124  BP: 119/82  Pulse: 113  Weight: 350 lb (158.759 kg)  SpO2: 95%    General:  Well appearing. No resp difficulty HEENT: normal Neck: Thick. JVP 8 cm. Carotids 2+ bilaterally; no bruits. No lymphadenopathy or thryomegaly appreciated. Cor: PMI normal. Regular rate & rhythm. No rubs, gallops or murmurs. Lungs: clear Abdomen: soft, nontender, nondistended. No hepatosplenomegaly. No  bruits or masses. Good bowel sounds. Extremities: no cyanosis, clubbing, rash. LUE double lumen PICC. Chronic edema lower legs, improved.  Neuro: alert & orientedx3, cranial nerves grossly intact. Moves all 4 extremities w/o difficulty. Affect pleasant.  ASSESSMENT & PLAN: 1. Chronic Systolic Heart Failure: Nonischemic cardiomyopathy.  Initial workup was in [place].  12/2013 ECHO EF 20% with moderate RV dysfunction. Recent admission with low output heart failure, now on Milrionone at 0.375 mcg via PICC. He feels much better since discharge on milrinone.  - Volume looks better. Continue bumex 4 mg twice a day and KCl 20 meq twice a day.  - Increase hydralazine to 25 mg tid and continue Imdur 30 mg daily . He is not on Ace/Spiro due to CKD, not on beta blocker with low output. .  - Repeat ECHO in 3 months.  - He is not currently a candidate for LVAD due to CKD, obesity, RV dysfunction, and his social situation.  However, this may change.  He does seem motivated today.  - Check BMET and digoxin level this week. Continue [facility] to for home milrinone, PICC, and weekly BMET.  - Has LBBB with QRS 146 msec.  On home inotropes currently so not good ICD candidate but would like him evaluated for CRT => if he is a responder, may be able to wean off milrinone.  Will arrange appt with EP soon.  - No definite etiology for CHF.  Would like to get cardiac MRI eventually but may be too large.  If planned for CRT, would try to get prior.  2. OSA/OHS:  continue nightly BiPap. Refer to pulmonary to establish care in [place].  3. CKD: Suspect cardiorenal component. Weekly BMET, hopefully will improve with milrinone and increased output.   Followup in 1 month.   Marca Ancona 01/23/2014 12:47 PM

## 2014-01-24 ENCOUNTER — Institutional Professional Consult (permissible substitution): Payer: Medicare Other | Admitting: Pulmonary Disease

## 2014-01-24 ENCOUNTER — Telehealth (HOSPITAL_COMMUNITY): Payer: Self-pay | Admitting: Adult Health

## 2014-01-24 MED ORDER — POTASSIUM CHLORIDE CRYS ER 20 MEQ PO TBCR: 40.0000 meq | EXTENDED_RELEASE_TABLET | Freq: Two times a day (BID) | ORAL | Status: DC

## 2014-01-24 NOTE — Telephone Encounter (Signed)
Provided with lab results. Potassium 2.8 . Instructed to take an additional 40 meq of potassium today then start 40 meq Kdur twice a day.  Plan to repeat BMET next week. Steven Wyatt verbalized understanding.   Idamay Hosein NP-C  4:52 PM

## 2014-01-24 NOTE — Addendum Note (Signed)
Encounter addended by: Ernestina Penna, CCT on: 01/24/2014  9:10 AM<BR>     Documentation filed: Charges VN

## 2014-01-30 ENCOUNTER — Telehealth (HOSPITAL_COMMUNITY): Payer: Self-pay | Admitting: Anesthesiology

## 2014-01-30 ENCOUNTER — Ambulatory Visit: Payer: Medicare Other | Admitting: Internal Medicine

## 2014-01-30 MED ORDER — POTASSIUM CHLORIDE CRYS ER 20 MEQ PO TBCR: 20.0000 meq | EXTENDED_RELEASE_TABLET | Freq: Two times a day (BID) | ORAL | Status: DC

## 2014-01-30 MED ORDER — SPIRONOLACTONE 25 MG PO TABS: 25.0000 mg | ORAL_TABLET | Freq: Every day | ORAL | Status: DC

## 2014-01-30 NOTE — Telephone Encounter (Signed)
Labs reviewed 01/27/14:  K+ 2.9 and Cr 1.36. He reports that he has not been taking 40 meq BID sometimes forgets and does not like taking because they are big. Has had CRI in the past, however now with milrinone Cr stable. Will try to start spiro 12.5 mg daily and check BMET next week. Take 40 meq BID today and then once start Cleda Daub go to 20 meq BID.   Ulla Potash B 1:51 PM

## 2014-02-03 ENCOUNTER — Telehealth (HOSPITAL_COMMUNITY): Payer: Self-pay | Admitting: Cardiology

## 2014-02-03 ENCOUNTER — Ambulatory Visit (HOSPITAL_COMMUNITY)
Admission: RE | Admit: 2014-02-03 | Discharge: 2014-02-03 | Disposition: A | Discharge status: Home / Self Care | Payer: Medicare Other | Source: Ambulatory Visit | Attending: Internal Medicine | Admitting: Internal Medicine

## 2014-02-03 ENCOUNTER — Other Ambulatory Visit (HOSPITAL_COMMUNITY): Payer: Self-pay | Admitting: Internal Medicine

## 2014-02-03 DIAGNOSIS — I509 Heart failure, unspecified: Secondary | ICD-10-CM

## 2014-02-03 DIAGNOSIS — I5022 Chronic systolic (congestive) heart failure: Secondary | ICD-10-CM

## 2014-02-03 NOTE — Telephone Encounter (Signed)
sch pt for new PICC line in radiology at 1 pm, pt aware and agreeable and states he will be here

## 2014-02-03 NOTE — Telephone Encounter (Signed)
Pt called to report when he woke up this am his PICC line was out Spoke with his [facility] nurse and she was unsure what to do, advised pt that normally this means he would need to come to [facility]  To have line replaced sooner rather than later.  Will speak with CHF team and call pt ASAP

## 2014-02-03 NOTE — Procedures (Signed)
Placement of new right arm PICC.  Tip in SVC/RA junction.  No immediate complication.

## 2014-02-13 ENCOUNTER — Telehealth (HOSPITAL_COMMUNITY): Payer: Self-pay | Admitting: Anesthesiology

## 2014-02-13 MED ORDER — MAGNESIUM OXIDE 400 MG PO TABS: 400.0000 mg | ORAL_TABLET | Freq: Every day | ORAL | Status: DC

## 2014-02-13 MED ORDER — POTASSIUM CHLORIDE CRYS ER 20 MEQ PO TBCR: EXTENDED_RELEASE_TABLET | ORAL | Status: DC

## 2014-02-13 NOTE — Telephone Encounter (Signed)
Reviewed labs from 02/10/14.   K+2.9, Cr 1.23 and Mag 1.4.  Have asked the patient to take extra 60 meq today and increase home dose to 40 meq qam and 20 meq qpm. Start Mag oxide 400 mg daily. Patient aware. Recheck next week.  Ulla Potash B NP-C 5:30 PM

## 2014-02-15 ENCOUNTER — Ambulatory Visit: Payer: Medicare Other | Admitting: Internal Medicine

## 2014-02-17 ENCOUNTER — Telehealth (HOSPITAL_COMMUNITY): Payer: Self-pay | Admitting: Cardiology

## 2014-02-17 NOTE — Telephone Encounter (Signed)
Attempting to contact pt regarding lab results/home health visit Per [facility] nurse Katelyn BS reading HIGH >400 Unsuccessful in contacting pt

## 2014-02-20 ENCOUNTER — Telehealth (HOSPITAL_COMMUNITY): Payer: Self-pay | Admitting: *Deleted

## 2014-02-20 ENCOUNTER — Ambulatory Visit (HOSPITAL_COMMUNITY)
Admission: RE | Admit: 2014-02-20 | Discharge: 2014-02-20 | Disposition: A | Discharge status: Home / Self Care | Payer: Medicare Other | Source: Ambulatory Visit | Attending: Internal Medicine | Admitting: Internal Medicine

## 2014-02-20 ENCOUNTER — Encounter (HOSPITAL_COMMUNITY): Payer: Self-pay

## 2014-02-20 VITALS — BP 92/64 | HR 94 | Ht 68.5 in | Wt 339.8 lb

## 2014-02-20 DIAGNOSIS — I5022 Chronic systolic (congestive) heart failure: Secondary | ICD-10-CM | POA: Insufficient documentation

## 2014-02-20 DIAGNOSIS — N183 Chronic kidney disease, stage 3 unspecified: Secondary | ICD-10-CM | POA: Insufficient documentation

## 2014-02-20 LAB — BASIC METABOLIC PANEL
BUN: 27 mg/dL — ABNORMAL HIGH (ref 6–23)
CO2: 35 mEq/L — ABNORMAL HIGH (ref 19–32)
CREATININE: 1.42 mg/dL — AB (ref 0.50–1.35)
Calcium: 9.9 mg/dL (ref 8.4–10.5)
Chloride: 70 mEq/L — ABNORMAL LOW (ref 96–112)
GFR calc Af Amer: 73 mL/min — ABNORMAL LOW (ref >90)
GFR, EST NON AFRICAN AMERICAN: 63 mL/min — AB (ref >90)
GLUCOSE: 675 mg/dL — AB (ref 70–99)
Potassium: 2.5 mEq/L — CL (ref 3.7–5.3)
SODIUM: 124 meq/L — AB (ref 137–147)

## 2014-02-20 LAB — DIGOXIN LEVEL: Digoxin Level: 0.4 ng/mL — ABNORMAL LOW (ref 0.8–2.0)

## 2014-02-20 MED ORDER — CARVEDILOL 3.125 MG PO TABS: 3.1250 mg | ORAL_TABLET | Freq: Two times a day (BID) | ORAL | Status: AC

## 2014-02-20 MED ORDER — POTASSIUM CHLORIDE CRYS ER 20 MEQ PO TBCR: 60.0000 meq | EXTENDED_RELEASE_TABLET | Freq: Two times a day (BID) | ORAL | Status: DC

## 2014-02-20 NOTE — Telephone Encounter (Signed)
Message copied by Noralee Space on Mon Feb 20, 2014  4:18 PM ------      Message from: Arvilla Meres R      Created: Mon Feb 20, 2014  1:25 PM       Please give kcl 120 today and increase to 60 bid. Recheck thurs or Friday. Needs to call PCP re diabetes ASAP. ------

## 2014-02-20 NOTE — Addendum Note (Signed)
Encounter addended by: Noralee Space, RN on: 02/20/2014 11:21 AM<BR>     Documentation filed: Patient Instructions Section, Orders

## 2014-02-20 NOTE — Patient Instructions (Signed)
Start Carvedilol 3.125 mg at bedtime for 1 week then begin taking in the AM as well  Your physician recommends that you schedule a follow-up appointment in: 1 month with echocardiogram

## 2014-02-20 NOTE — Progress Notes (Signed)
Patient ID: Steven Wyatt, male   DOB: 1976-04-20, 38 y.o.   MRN: [id number]  Weight Range   Baseline proBNP     HPI: Steven Wyatt is a 38 yo male with a history of NICM (nor cors Oct 2014), chronic systolic HF, morbid obesity, OSA, HTN, CKD and HLD.   Admitted 1/13-1/18/15 for A/C HF. He was placed on IV lasix gtt and milrinone, however signed out AMA. During his admission his weight decreased from 405 to 368 lbs. ECHO showed EF 20% with diff HK, grade II DD, mod pulm HTN and mod sev LAE. His Cr bumped to 1.56. CO-OX when he left AMA was 35% on 01/08/14.   Readmitted 01/11/14 after he had syncopal episode.  Diuresed with lasix drip and metolazone. He was transitioned to bumex 4 mg twice a day. He was not be placed on Ace/Spiro due to CKD. He is not currently LVAD/transplant candidate due to obesity, noncompliance, RV dysfunction, and CKD. Discharged on home Milrinone at 0.375 mcg with [facility] to follow. Discharge weight 357 pounds.   Follow-up: Returns for routine f/u. Saw Dr. Shirlee Latch last month and doing well. Hydralazine was increased. Continues on milrinone 0.375. Feels very good. Getting around without significant fatigue or SOB.  Followed by [facility] for home Milrinone, PICC and telemedicine. Weight now down to 336. Denies edema. Uses BiPap nightly. Trying to exercise on elliptical at home. Watching salt and fluid intake. Now living in apartment. [name] runs 90-115. No dizziness.   Labs 01/15/14 K 3.3 Creatinine 1.75  Labs 01/16/14 K 3.5 Creatinine 1.65   ECG: NSR, LBBB with QRS 146 msec   FH: No CHF, no SCD that he knows of.  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  Moved from [place].   ROS: All systems negative except as listed in HPI, PMH and Problem List.  PMH: 1. Nonischemic cardiomyopathy: Initial workup in [place].  LHC (10/14) with normal coronaries.  Echo (1/15) with EF 20%, mild diffuse hypokinesis, moderately dilated LV, moderately dilated RV  with moderately decreased systolic function, PA systolic pressure 59 mmHg.  1/15 low output heart failure, milrinone begun.  2. Morbid obesity 3. OHS/OSA on Bipap.  4. HTN 5. Hyperlipidemia 6. CKD: Suspect cardiorenal syndrome 7. Syncope: Vasovagal in setting of vomiting 8. Type II diabetes: Borderline.   Current Outpatient Prescriptions  Medication Sig Dispense Refill  . albuterol (PROVENTIL HFA;VENTOLIN HFA) 108 (90 BASE) MCG/ACT inhaler Inhale 2 puffs into the lungs daily as needed for wheezing or shortness of breath.      Marland Kitchen aspirin EC 81 MG tablet Take 81 mg by mouth daily.      Marland Kitchen atorvastatin (LIPITOR) 80 MG tablet Take 80 mg by mouth daily.      . bumetanide (BUMEX) 2 MG tablet Take 2 tablets (4 mg total) by mouth 2 (two) times daily.  120 tablet  6  . digoxin (LANOXIN) 0.125 MG tablet Take 1 tablet (0.125 mg total) by mouth daily.  30 tablet  6  . hydrALAZINE (APRESOLINE) 25 MG tablet Take 1 tablet (25 mg total) by mouth every 8 (eight) hours.  90 tablet  6  . isosorbide mononitrate (IMDUR) 30 MG 24 hr tablet Take 1 tablet (30 mg total) by mouth daily.  30 tablet  6  . magnesium oxide (MAG-OX) 400 MG tablet Take 1 tablet (400 mg total) by mouth daily.  30 tablet  3  . metolazone (ZAROXOLYN) 5 MG  tablet Take 1 tablet (5 mg total) by mouth as needed. Weight gain 3 pounds in 24 hours  8 tablet  6  . milrinone (PRIMACOR) 20 MG/100ML SOLN infusion Inject 62.7375 mcg/min into the vein continuous.  100 mL  6  . potassium chloride SA (K-DUR,KLOR-CON) 20 MEQ tablet Take 40 meq (2 tablets) every am and 20 meq (1 tablet) every pm  90 tablet  3  . spironolactone (ALDACTONE) 25 MG tablet Take 1 tablet (25 mg total) by mouth daily.  90 tablet  3   No current facility-administered medications for this encounter.     PHYSICAL EXAM: Filed Vitals:   02/20/14 1009  BP: 92/64  Pulse: 94  Height: 5' 8.5" (1.74 m)  Weight: 339 lb 12.8 oz (154.132 kg)  SpO2: 97%    General:  Well appearing. No  resp difficulty HEENT: normal Neck: Thick. JVP flat Carotids 2+ bilaterally; no bruits. No lymphadenopathy or thryomegaly appreciated. Cor: PMI nonpalpable. Regular rate & rhythm. No rubs, gallops or murmurs. Lungs: clear Abdomen: obese soft, nontender, nondistended.Good bowel sounds. Extremities: no cyanosis, clubbing, rash. LUE double lumen PICC. Chronic venous stasis changes. No edema.  Neuro: alert & orientedx3, cranial nerves grossly intact. Moves all 4 extremities w/o difficulty. Affect pleasant.  ASSESSMENT & PLAN: 1. Chronic Systolic Heart Failure: Nonischemic cardiomyopathy.  12/2013 ECHO EF 20% with moderate RV dysfunction. Recent admission with low output heart failure, now on Milrionone at 0.375 mcg via PICC. He feels much better since discharge on milrinone. Now NYHA II - Much improved. Now NYHA II on milrinone. Volume status much better.  - BP is low but will try to add on low-dose carvedilol at 3.125 bid - start with night dose first - Will check labs today - Return to clinic in 1 month with ECHO. If EF still < 35% will need evaluation for CRT-D - May try to start weaning milrinone to 0.25 at next vists - No definite etiology for CHF.  Would like to get cardiac MRI eventually but may be too large.  If planned for CRT, would try to get prior.   2. OSA/OHS:  continue nightly BiPap.   3. CKD: Suspect cardiorenal component. Recheck BMET today  Followup in 1 month.   Arvilla Meresaniel Bensimhon MD 02/20/2014 11:03 AM

## 2014-02-21 ENCOUNTER — Encounter: Payer: Self-pay | Admitting: Internal Medicine

## 2014-02-21 ENCOUNTER — Institutional Professional Consult (permissible substitution): Payer: Medicare Other | Admitting: Pulmonary Disease

## 2014-02-22 NOTE — Telephone Encounter (Signed)
Pt aware, see lab note 

## 2014-02-24 ENCOUNTER — Emergency Department (HOSPITAL_COMMUNITY): Payer: Medicare Other

## 2014-02-24 ENCOUNTER — Inpatient Hospital Stay (HOSPITAL_COMMUNITY)
Admission: EM | Admit: 2014-02-24 | Discharge: 2014-02-26 | DRG: 638 | Disposition: A | Discharge status: Home / Self Care | Payer: Medicare Other | Attending: Internal Medicine | Admitting: Internal Medicine

## 2014-02-24 ENCOUNTER — Telehealth: Payer: Self-pay | Admitting: Nurse Practitioner

## 2014-02-24 ENCOUNTER — Encounter (HOSPITAL_COMMUNITY): Payer: Self-pay | Admitting: Emergency Medicine

## 2014-02-24 DIAGNOSIS — E785 Hyperlipidemia, unspecified: Secondary | ICD-10-CM | POA: Diagnosis present

## 2014-02-24 DIAGNOSIS — N183 Chronic kidney disease, stage 3 unspecified: Secondary | ICD-10-CM | POA: Diagnosis present

## 2014-02-24 DIAGNOSIS — E1310 Other specified diabetes mellitus with ketoacidosis without coma: Principal | ICD-10-CM | POA: Diagnosis present

## 2014-02-24 DIAGNOSIS — G4733 Obstructive sleep apnea (adult) (pediatric): Secondary | ICD-10-CM | POA: Diagnosis present

## 2014-02-24 DIAGNOSIS — I129 Hypertensive chronic kidney disease with stage 1 through stage 4 chronic kidney disease, or unspecified chronic kidney disease: Secondary | ICD-10-CM | POA: Diagnosis present

## 2014-02-24 DIAGNOSIS — T502X5A Adverse effect of carbonic-anhydrase inhibitors, benzothiadiazides and other diuretics, initial encounter: Secondary | ICD-10-CM | POA: Diagnosis present

## 2014-02-24 DIAGNOSIS — R739 Hyperglycemia, unspecified: Secondary | ICD-10-CM

## 2014-02-24 DIAGNOSIS — I5022 Chronic systolic (congestive) heart failure: Secondary | ICD-10-CM | POA: Diagnosis present

## 2014-02-24 DIAGNOSIS — I428 Other cardiomyopathies: Secondary | ICD-10-CM | POA: Diagnosis present

## 2014-02-24 DIAGNOSIS — G4730 Sleep apnea, unspecified: Secondary | ICD-10-CM | POA: Diagnosis present

## 2014-02-24 DIAGNOSIS — E876 Hypokalemia: Secondary | ICD-10-CM | POA: Diagnosis present

## 2014-02-24 DIAGNOSIS — E1110 Type 2 diabetes mellitus with ketoacidosis without coma: Secondary | ICD-10-CM | POA: Diagnosis present

## 2014-02-24 DIAGNOSIS — Z7982 Long term (current) use of aspirin: Secondary | ICD-10-CM

## 2014-02-24 DIAGNOSIS — I509 Heart failure, unspecified: Secondary | ICD-10-CM | POA: Diagnosis present

## 2014-02-24 DIAGNOSIS — E871 Hypo-osmolality and hyponatremia: Secondary | ICD-10-CM | POA: Diagnosis present

## 2014-02-24 DIAGNOSIS — I10 Essential (primary) hypertension: Secondary | ICD-10-CM | POA: Diagnosis present

## 2014-02-24 DIAGNOSIS — Z6841 Body Mass Index (BMI) 40.0 and over, adult: Secondary | ICD-10-CM

## 2014-02-24 HISTORY — DX: Chronic kidney disease, stage 3 unspecified: N18.30

## 2014-02-24 HISTORY — DX: Chronic kidney disease, stage 3 (moderate): N18.3

## 2014-02-24 LAB — CBC
HCT: 38.9 % — ABNORMAL LOW (ref 39.0–52.0)
Hemoglobin: 13.3 g/dL (ref 13.0–17.0)
MCH: 25 pg — ABNORMAL LOW (ref 26.0–34.0)
MCHC: 34.2 g/dL (ref 30.0–36.0)
MCV: 73 fL — AB (ref 78.0–100.0)
Platelets: 232 10*3/uL (ref 150–400)
RBC: 5.33 MIL/uL (ref 4.22–5.81)
RDW: 16.1 % — AB (ref 11.5–15.5)
WBC: 6.4 10*3/uL (ref 4.0–10.5)

## 2014-02-24 LAB — URINALYSIS, ROUTINE W REFLEX MICROSCOPIC
BILIRUBIN URINE: NEGATIVE
Glucose, UA: >1000 mg/dL — AB
Hgb urine dipstick: NEGATIVE
KETONES UR: NEGATIVE mg/dL
LEUKOCYTES UA: NEGATIVE
Nitrite: NEGATIVE
PH: 5.5 (ref 5.0–8.0)
PROTEIN: NEGATIVE mg/dL
Specific Gravity, Urine: 1.028 (ref 1.005–1.030)
Urobilinogen, UA: 0.2 mg/dL (ref 0.0–1.0)

## 2014-02-24 LAB — BASIC METABOLIC PANEL
BUN: 27 mg/dL — AB (ref 6–23)
CALCIUM: 9.4 mg/dL (ref 8.4–10.5)
CO2: 35 mEq/L — ABNORMAL HIGH (ref 19–32)
CREATININE: 1.31 mg/dL (ref 0.50–1.35)
Chloride: 69 mEq/L — ABNORMAL LOW (ref 96–112)
GFR calc non Af Amer: 69 mL/min — ABNORMAL LOW (ref >90)
GFR, EST AFRICAN AMERICAN: 80 mL/min — AB (ref >90)
Glucose, Bld: 713 mg/dL (ref 70–99)
Potassium: 2.6 mEq/L — CL (ref 3.7–5.3)
Sodium: 119 mEq/L — CL (ref 137–147)

## 2014-02-24 LAB — CBG MONITORING, ED: Glucose-Capillary: >600 mg/dL (ref 70–99)

## 2014-02-24 LAB — URINE MICROSCOPIC-ADD ON

## 2014-02-24 MED ORDER — POTASSIUM CHLORIDE 10 MEQ/100ML IV SOLN
10.0000 meq | INTRAVENOUS | Status: AC
  Administered 2014-02-24: 10 meq via INTRAVENOUS
  Filled 2014-02-24: qty 100

## 2014-02-24 MED ORDER — SODIUM CHLORIDE 0.9 % IV SOLN
INTRAVENOUS | Status: DC
  Administered 2014-02-24: 5.4 [IU]/h via INTRAVENOUS
  Administered 2014-02-25: 8.6 [IU]/h via INTRAVENOUS
  Filled 2014-02-24: qty 1

## 2014-02-24 MED ORDER — SODIUM CHLORIDE 0.9 % IV BOLUS (SEPSIS)
1000.0000 mL | Freq: Once | INTRAVENOUS | Status: AC
  Administered 2014-02-24: 1000 mL via INTRAVENOUS

## 2014-02-24 MED ORDER — SODIUM CHLORIDE 0.9 % IV BOLUS (SEPSIS)
1000.0000 mL | Freq: Once | INTRAVENOUS | Status: DC
Start: 2014-02-24 — End: 2014-02-24

## 2014-02-24 NOTE — Telephone Encounter (Signed)
I received a call from [place], RN with [facility] re: Steven Wyatt bmet from this afternoon.  Na 129, K 2.9, CO2 40, BUN 16, Creat 1.23, Gluc 489.  Reviewed with Dr. Gala Romney.  I called Mr. Robledo and advised to change Kdur dose to 60 meq TID.  I also recommended that he obtain PCP f/u as soon as possible to manage his diabetes.  His sugar today of 489 is actually better than it was the other day.  He verbalized understanding of all instructions.  He will require a f/u bmet mid-week next week.

## 2014-02-24 NOTE — ED Provider Notes (Signed)
Medical screening examination/treatment/procedure(s) were conducted as a shared visit with non-physician practitioner(s) and myself.  I personally evaluated the patient during the encounter.   EKG Interpretation None       Date: 02/24/2014  Rate: 91  Rhythm: normal sinus rhythm  QRS Axis: left  Intervals: PR prolonged  ST/T Wave abnormalities: nonspecific ST/T changes  Conduction Disutrbances:left bundle branch block  Narrative Interpretation:   Old EKG Reviewed: unchanged  No sniffing change in EKG compared to 01/23/2014. Today's EKG not consistent with an acute inferior MI.   CRITICAL CARE Performed by: Shelda Jakes. Total critical care time: 30 Critical care time was exclusive of separately billable procedures and treating other patients. Critical care was necessary to treat or prevent imminent or life-threatening deterioration. Critical care was time spent personally by me on the following activities: development of treatment plan with patient and/or surrogate as well as nursing, discussions with consultants, evaluation of patient's response to treatment, examination of patient, obtaining history from patient or surrogate, ordering and performing treatments and interventions, ordering and review of laboratory studies, ordering and review of radiographic studies, pulse oximetry and re-evaluation of patient's condition.   Patient with marked hyperglycemia. Patient with a history of diabetes. Borderline in the past currently on no medications. History of congestive heart failure. Also history of hypertension. Patient's blood sugar was 839. Patient's had high blood sugar readings since Monday around 439. Patient does have a blood glucose monitor will not register probably to his reading level is too high. Patient here with marked hyperglycemia will require glucose stabilizer also with significant hypokalemia which is concerning however no evidence of acidosis. We'll give potassium IV  and start glucose stabilizer. Again no evidence of DKA. Also chest x-ray without the frank congestive heart failure. Today's EKG is unchanged from her worry second significant left bundle branch block pattern no evidence of acute MI.  Shelda Jakes, MD 02/24/14 [phone]

## 2014-02-24 NOTE — ED Notes (Signed)
Discussed blood sugar reading not registering on the glucose monitor with Leotis Shames, PA-C, and if staff can access PICC line for labs.  Lauren acknowledges and allows use of PICC line.

## 2014-02-24 NOTE — ED Notes (Signed)
Patient transported to X-ray 

## 2014-02-24 NOTE — ED Notes (Signed)
IV team will not be completing IV start at this time since all medications currently ordered are compatible.  Will infuse through current ports.

## 2014-02-24 NOTE — ED Notes (Signed)
IV team was paged to start new IV line on patient.

## 2014-02-24 NOTE — ED Notes (Signed)
Per EMS, patient had a blood sugar reading of 839 with office lab draw today.  His home health nurse has been getting high readings since Monday (439).  His personal blood glucose monitor will not register because the level is too high.  His only complaint is dry mouth at this time.  Patient is alert and oriented.

## 2014-02-24 NOTE — ED Notes (Signed)
Lauren, PA-C informed of abnormal lab work, she is going to assess the patient.

## 2014-02-24 NOTE — ED Provider Notes (Signed)
CSN: [id number]     Arrival date & time 02/24/14  2123 History   First MD Initiated Contact with Patient 02/24/14 2130     Chief Complaint  Patient presents with  . Hyperglycemia     (Consider location/radiation/quality/duration/timing/severity/associated sxs/prior Treatment) HPI Comments: Steven Wyatt is a 38 year-old male with a past medical history of Borderline DM, CHF, Morbid obesity, OSA, HTN, presenting the Emergency Department with a chief complaint of elevated glucose.  The patient reports his blood sugar level was >400 4 days ago.  He reports his blood glucose was >800 and was told to come to the ED.  He complains of dry mouth but denies polydipsia, or polyuria.  The patient states he is on a limited fluid intake due to HF, and he is compliant with the recomendations.  The patient reports a "spike" in his glucose levels several years ago with previous milrinone infusion.  He reports he took 5 units of lantus. Denies headache, abdominal pain, vomiting, nausea, diarrhea, or visual changes. Currently on a milrinone infusion. No PCP   The history is provided by the patient and medical records. No language interpreter was used.    Past Medical History  Diagnosis Date  . CHF (congestive heart failure)   . Diabetes     "borderline:  . HTN (hypertension)   . Morbid obesity with BMI of 50.0-59.9, adult   . Sleep apnea     on Bi Pap  . Dyslipidemia    History reviewed. No pertinent past surgical history. History reviewed. No pertinent family history. History  Substance Use Topics  . Smoking status: Never Smoker   . Smokeless tobacco: Not on file  . Alcohol Use: No    Review of Systems  Constitutional: Negative for fever, chills and fatigue.  Eyes: Negative for photophobia and visual disturbance.  Respiratory: Negative for cough.   Cardiovascular: Negative for chest pain and leg swelling.  Gastrointestinal: Negative for nausea, vomiting, abdominal pain and diarrhea.   Genitourinary: Negative for dysuria, urgency and hematuria.  Skin: Negative for wound.  Neurological: Negative for weakness, light-headedness and headaches.      Allergies  Iodine and Shellfish allergy  Home Medications   Current Outpatient Rx  Name  Route  Sig  Dispense  Refill  . albuterol (PROVENTIL HFA;VENTOLIN HFA) 108 (90 BASE) MCG/ACT inhaler   Inhalation   Inhale 2 puffs into the lungs daily as needed for wheezing or shortness of breath.         Marland Kitchen aspirin EC 81 MG tablet   Oral   Take 81 mg by mouth daily.         Marland Kitchen atorvastatin (LIPITOR) 80 MG tablet   Oral   Take 80 mg by mouth daily.         . bumetanide (BUMEX) 2 MG tablet   Oral   Take 2 tablets (4 mg total) by mouth 2 (two) times daily.   120 tablet   6   . carvedilol (COREG) 3.125 MG tablet   Oral   Take 1 tablet (3.125 mg total) by mouth 2 (two) times daily.   60 tablet   3   . digoxin (LANOXIN) 0.125 MG tablet   Oral   Take 1 tablet (0.125 mg total) by mouth daily.   30 tablet   6   . hydrALAZINE (APRESOLINE) 25 MG tablet   Oral   Take 1 tablet (25 mg total) by mouth every 8 (eight) hours.   90 tablet  6   . isosorbide mononitrate (IMDUR) 30 MG 24 hr tablet   Oral   Take 1 tablet (30 mg total) by mouth daily.   30 tablet   6   . magnesium oxide (MAG-OX) 400 MG tablet   Oral   Take 1 tablet (400 mg total) by mouth daily.   30 tablet   3   . metolazone (ZAROXOLYN) 5 MG tablet   Oral   Take 1 tablet (5 mg total) by mouth as needed. Weight gain 3 pounds in 24 hours   8 tablet   6   . milrinone (PRIMACOR) 20 MG/100ML SOLN infusion   Intravenous   Inject 62.7375 mcg/min into the vein continuous.   100 mL   6   . potassium chloride SA (K-DUR,KLOR-CON) 20 MEQ tablet   Oral   Take 3 tablets (60 mEq total) by mouth 2 (two) times daily. Take 40 meq (2 tablets) every am and 20 meq (1 tablet) every pm   90 tablet   3     Please cancel all previous orders for current medi  .Marland KitchenMarland Kitchen    BP 122/84  Pulse 90  Temp(Src) 97.9 F (36.6 C) (Oral)  Resp 19  Ht 5\' 8"  (1.727 m)  Wt 336 lb (152.409 kg)  BMI 51.10 kg/m2  SpO2 95% Physical Exam  Nursing note and vitals reviewed. Constitutional: He is oriented to person, place, and time. He appears well-developed and well-nourished.  Non-toxic appearance. He does not have a sickly appearance. He does not appear ill. No distress.  Exam limited by patient's body habitus.    HENT:  Head: Normocephalic and atraumatic.  Mouth/Throat: Mucous membranes are dry.  Neck: Neck supple.  Cardiovascular: Normal rate and regular rhythm.   No lower extremity edema  Pulmonary/Chest: Effort normal and breath sounds normal. No respiratory distress. He has no wheezes.  Patient is able to speak in complete sentences.    Abdominal: Soft. Bowel sounds are normal. There is no tenderness. There is no rebound.  Musculoskeletal: Normal range of motion.  Neurological: He is alert and oriented to person, place, and time.  Skin: Skin is warm and dry. He is not diaphoretic.  Psychiatric: He has a normal mood and affect. His behavior is normal.    ED Course  Procedures (including critical care time) Labs Review Labs Reviewed  CBC - Abnormal; Notable for the following:    HCT 38.9 (*)    MCV 73.0 (*)    MCH 25.0 (*)    RDW 16.1 (*)    All other components within normal limits  BASIC METABOLIC PANEL - Abnormal; Notable for the following:    Sodium 119 (*)    Potassium 2.6 (*)    Chloride 69 (*)    CO2 35 (*)    Glucose, Bld 713 (*)    BUN 27 (*)    GFR calc non Af Amer 69 (*)    GFR calc Af Amer 80 (*)    All other components within normal limits  URINALYSIS, ROUTINE W REFLEX MICROSCOPIC - Abnormal; Notable for the following:    Glucose, UA >1000 (*)    All other components within normal limits  CBG MONITORING, ED - Abnormal; Notable for the following:    Glucose-Capillary >600 (*)    All other components within normal limits   CBG MONITORING, ED - Abnormal; Notable for the following:    Glucose-Capillary >600 (*)    All other components within normal limits  URINE  MICROSCOPIC-ADD ON   Imaging Review Dg Chest 2 View  02/24/2014   CLINICAL DATA:  CHF  EXAM: CHEST  2 VIEW  COMPARISON:  01/11/2014  FINDINGS: Right upper extremity PICC, tip at the upper SVC.  Mild cardiomegaly which is chronic.  Likely central venous congestion, although accentuated by lung volumes. No edema. No consolidation, effusion, or pneumothorax. Negative osseous structures.  IMPRESSION: Venous congestion without pulmonary edema.   Electronically Signed   By: Tiburcio PeaJonathan  Watts M.D.   On: 02/24/2014 22:57    EKG Date: 02/24/2014  Rate: 91  Rhythm: normal sinus rhythm  QRS Axis: left  Intervals: PR prolonged  ST/T Wave abnormalities: nonspecific ST/T changes  Conduction Disutrbances:left bundle branch block  Narrative Interpretation:  Old EKG Reviewed: unchanged  No significant change in EKG compared to 01/23/2014. Today's EKG not consistent with an acute inferior MI.   MDM   Final diagnoses:  Hyperglycemia   Pt with a history of borderline DM presents with elevated glucose levels and denies symptoms of hyperglycemia at this time. BMP: Glucose 713, Na 119,  K 2.8, CO2 35. Anion gap 15. Na correction for hyperglycemia 134. Discussed BMP results with Dr. Deretha EmoryZackowski, who advises the patient is not acidotic and start potassium prior to initiating insulin. Chest XR Venous congestion without pulmonary edema. UA shows >1000 glucose without infection. 2355Page to internal medicine  0015 Re-eval: patient reports doing well and no complaints at this time. CBG >700. Potassium ordered and will re page IM. 0028Page to internal medicine. Discussed patient history and condition with Dr. Lovell SheehanJenkins who will admit the patient for further evaluation of his hyperglycemia.     Clabe SealLauren M Masie Bermingham, PA-C 02/25/14 [phone]

## 2014-02-24 NOTE — ED Notes (Signed)
Clarified that patient is to receive 2 boluses total.  The first one has not been started due to confirmation of PICC line per X-Ray. Lauren acknowledges and no new orders.

## 2014-02-25 ENCOUNTER — Encounter (HOSPITAL_COMMUNITY): Payer: Self-pay | Admitting: Internal Medicine

## 2014-02-25 DIAGNOSIS — N183 Chronic kidney disease, stage 3 unspecified: Secondary | ICD-10-CM

## 2014-02-25 DIAGNOSIS — G4730 Sleep apnea, unspecified: Secondary | ICD-10-CM

## 2014-02-25 DIAGNOSIS — E871 Hypo-osmolality and hyponatremia: Secondary | ICD-10-CM | POA: Diagnosis present

## 2014-02-25 DIAGNOSIS — E1110 Type 2 diabetes mellitus with ketoacidosis without coma: Secondary | ICD-10-CM

## 2014-02-25 DIAGNOSIS — I10 Essential (primary) hypertension: Secondary | ICD-10-CM

## 2014-02-25 DIAGNOSIS — I5022 Chronic systolic (congestive) heart failure: Secondary | ICD-10-CM

## 2014-02-25 DIAGNOSIS — Z6841 Body Mass Index (BMI) 40.0 and over, adult: Secondary | ICD-10-CM

## 2014-02-25 DIAGNOSIS — E876 Hypokalemia: Secondary | ICD-10-CM

## 2014-02-25 HISTORY — DX: Type 2 diabetes mellitus with ketoacidosis without coma: E11.10

## 2014-02-25 LAB — GLUCOSE, CAPILLARY
GLUCOSE-CAPILLARY: 410 mg/dL — AB (ref 70–99)
GLUCOSE-CAPILLARY: 297 mg/dL — AB (ref 70–99)
GLUCOSE-CAPILLARY: 254 mg/dL — AB (ref 70–99)
GLUCOSE-CAPILLARY: 301 mg/dL — AB (ref 70–99)
GLUCOSE-CAPILLARY: 221 mg/dL — AB (ref 70–99)
Glucose-Capillary: 492 mg/dL — ABNORMAL HIGH (ref 70–99)
Glucose-Capillary: 367 mg/dL — ABNORMAL HIGH (ref 70–99)
Glucose-Capillary: 201 mg/dL — ABNORMAL HIGH (ref 70–99)
Glucose-Capillary: 203 mg/dL — ABNORMAL HIGH (ref 70–99)
Glucose-Capillary: 212 mg/dL — ABNORMAL HIGH (ref 70–99)
Glucose-Capillary: 244 mg/dL — ABNORMAL HIGH (ref 70–99)
Glucose-Capillary: 398 mg/dL — ABNORMAL HIGH (ref 70–99)
Glucose-Capillary: 407 mg/dL — ABNORMAL HIGH (ref 70–99)

## 2014-02-25 LAB — BASIC METABOLIC PANEL
BUN: 22 mg/dL (ref 6–23)
BUN: 20 mg/dL (ref 6–23)
BUN: 23 mg/dL (ref 6–23)
CHLORIDE: 77 meq/L — AB (ref 96–112)
CHLORIDE: 80 meq/L — AB (ref 96–112)
CO2: 39 meq/L — AB (ref 19–32)
CO2: 36 meq/L — AB (ref 19–32)
CO2: 33 mEq/L — ABNORMAL HIGH (ref 19–32)
CREATININE: 1.09 mg/dL (ref 0.50–1.35)
CREATININE: 1.27 mg/dL (ref 0.50–1.35)
Calcium: 9.3 mg/dL (ref 8.4–10.5)
Calcium: 9.2 mg/dL (ref 8.4–10.5)
Calcium: 9.1 mg/dL (ref 8.4–10.5)
Chloride: 78 mEq/L — ABNORMAL LOW (ref 96–112)
Creatinine, Ser: 1.19 mg/dL (ref 0.50–1.35)
GFR calc Af Amer: >90 mL/min (ref >90)
GFR calc Af Amer: >90 mL/min (ref >90)
GFR calc non Af Amer: 78 mL/min — ABNORMAL LOW (ref >90)
GFR calc non Af Amer: 86 mL/min — ABNORMAL LOW (ref >90)
GFR calc non Af Amer: 72 mL/min — ABNORMAL LOW (ref >90)
GFR, EST AFRICAN AMERICAN: 83 mL/min — AB (ref >90)
GLUCOSE: 314 mg/dL — AB (ref 70–99)
GLUCOSE: 409 mg/dL — AB (ref 70–99)
Glucose, Bld: 192 mg/dL — ABNORMAL HIGH (ref 70–99)
POTASSIUM: 2.7 meq/L — AB (ref 3.7–5.3)
Potassium: 2.8 mEq/L — CL (ref 3.7–5.3)
Potassium: 2.9 mEq/L — CL (ref 3.7–5.3)
Sodium: 126 mEq/L — ABNORMAL LOW (ref 137–147)
Sodium: 128 mEq/L — ABNORMAL LOW (ref 137–147)
Sodium: 124 mEq/L — ABNORMAL LOW (ref 137–147)

## 2014-02-25 LAB — CBG MONITORING, ED

## 2014-02-25 LAB — MAGNESIUM: MAGNESIUM: 2.7 mg/dL — AB (ref 1.5–2.5)

## 2014-02-25 LAB — MRSA PCR SCREENING: MRSA by PCR: NEGATIVE

## 2014-02-25 MED ORDER — ATORVASTATIN CALCIUM 80 MG PO TABS
80.0000 mg | ORAL_TABLET | Freq: Every day | ORAL | Status: DC
  Administered 2014-02-25 – 2014-02-26 (×2): 80 mg via ORAL
  Filled 2014-02-25 (×2): qty 1

## 2014-02-25 MED ORDER — INSULIN GLARGINE 100 UNIT/ML ~~LOC~~ SOLN: 10.0000 [IU] | Freq: Every day | SUBCUTANEOUS | Status: DC

## 2014-02-25 MED ORDER — MAGNESIUM OXIDE 400 (241.3 MG) MG PO TABS
400.0000 mg | ORAL_TABLET | Freq: Every day | ORAL | Status: DC
  Administered 2014-02-25 – 2014-02-26 (×2): 400 mg via ORAL
  Filled 2014-02-25 (×2): qty 1

## 2014-02-25 MED ORDER — PANTOPRAZOLE SODIUM 20 MG PO TBEC
20.0000 mg | DELAYED_RELEASE_TABLET | Freq: Every day | ORAL | Status: DC
  Administered 2014-02-25 – 2014-02-26 (×2): 20 mg via ORAL
  Filled 2014-02-25 (×2): qty 1

## 2014-02-25 MED ORDER — INSULIN ASPART 100 UNIT/ML ~~LOC~~ SOLN
0.0000 [IU] | Freq: Three times a day (TID) | SUBCUTANEOUS | Status: DC
  Administered 2014-02-25: 7 [IU] via SUBCUTANEOUS
  Administered 2014-02-25: 20 [IU] via SUBCUTANEOUS
  Administered 2014-02-25: 15 [IU] via SUBCUTANEOUS
  Administered 2014-02-26: 20 [IU] via SUBCUTANEOUS
  Administered 2014-02-26: 15 [IU] via SUBCUTANEOUS

## 2014-02-25 MED ORDER — CARVEDILOL 3.125 MG PO TABS
3.1250 mg | ORAL_TABLET | Freq: Two times a day (BID) | ORAL | Status: DC
  Administered 2014-02-25 – 2014-02-26 (×3): 3.125 mg via ORAL
  Filled 2014-02-25 (×5): qty 1

## 2014-02-25 MED ORDER — POTASSIUM CHLORIDE CRYS ER 20 MEQ PO TBCR
60.0000 meq | EXTENDED_RELEASE_TABLET | Freq: Two times a day (BID) | ORAL | Status: DC
  Administered 2014-02-25 – 2014-02-26 (×4): 60 meq via ORAL
  Filled 2014-02-25 (×6): qty 3

## 2014-02-25 MED ORDER — HYDRALAZINE HCL 20 MG/ML IJ SOLN: 5.0000 mg | Freq: Four times a day (QID) | INTRAMUSCULAR | Status: DC | PRN

## 2014-02-25 MED ORDER — ALBUTEROL SULFATE HFA 108 (90 BASE) MCG/ACT IN AERS: 2.0000 | INHALATION_SPRAY | Freq: Every day | RESPIRATORY_TRACT | Status: DC | PRN

## 2014-02-25 MED ORDER — POTASSIUM CHLORIDE 10 MEQ/100ML IV SOLN
10.0000 meq | INTRAVENOUS | Status: AC
  Administered 2014-02-25 (×4): 10 meq via INTRAVENOUS
  Filled 2014-02-25 (×4): qty 100

## 2014-02-25 MED ORDER — INSULIN GLARGINE 100 UNIT/ML ~~LOC~~ SOLN
10.0000 [IU] | SUBCUTANEOUS | Status: AC
  Administered 2014-02-25: 10 [IU] via SUBCUTANEOUS
  Filled 2014-02-25: qty 0.1

## 2014-02-25 MED ORDER — SODIUM CHLORIDE 0.9 % IV SOLN
INTRAVENOUS | Status: DC
  Administered 2014-02-25: 02:00:00 via INTRAVENOUS

## 2014-02-25 MED ORDER — BUMETANIDE 2 MG PO TABS
4.0000 mg | ORAL_TABLET | Freq: Two times a day (BID) | ORAL | Status: DC
  Administered 2014-02-25 – 2014-02-26 (×3): 4 mg via ORAL
  Filled 2014-02-25 (×5): qty 2

## 2014-02-25 MED ORDER — ENOXAPARIN SODIUM 40 MG/0.4ML ~~LOC~~ SOLN
40.0000 mg | Freq: Every day | SUBCUTANEOUS | Status: DC
  Administered 2014-02-25: 40 mg via SUBCUTANEOUS
  Filled 2014-02-25: qty 0.4

## 2014-02-25 MED ORDER — INSULIN ASPART 100 UNIT/ML ~~LOC~~ SOLN: 0.0000 [IU] | Freq: Every day | SUBCUTANEOUS | Status: DC

## 2014-02-25 MED ORDER — INSULIN GLARGINE 100 UNIT/ML ~~LOC~~ SOLN
10.0000 [IU] | Freq: Every day | SUBCUTANEOUS | Status: DC
  Administered 2014-02-26: 10 [IU] via SUBCUTANEOUS
  Filled 2014-02-25: qty 0.1

## 2014-02-25 MED ORDER — POTASSIUM CHLORIDE 10 MEQ/100ML IV SOLN
10.0000 meq | Freq: Once | INTRAVENOUS | Status: AC
  Administered 2014-02-25: 10 meq via INTRAVENOUS
  Filled 2014-02-25: qty 100

## 2014-02-25 MED ORDER — SODIUM CHLORIDE 0.9 % IJ SOLN
10.0000 mL | INTRAMUSCULAR | Status: DC | PRN
  Administered 2014-02-25: 10 mL
  Administered 2014-02-25: 20 mL

## 2014-02-25 MED ORDER — DEXTROSE-NACL 5-0.45 % IV SOLN
INTRAVENOUS | Status: DC
  Administered 2014-02-25: 10:00:00 via INTRAVENOUS

## 2014-02-25 MED ORDER — ALBUTEROL SULFATE (2.5 MG/3ML) 0.083% IN NEBU: 2.5000 mg | INHALATION_SOLUTION | Freq: Every day | RESPIRATORY_TRACT | Status: DC | PRN

## 2014-02-25 MED ORDER — ISOSORBIDE MONONITRATE ER 30 MG PO TB24
30.0000 mg | ORAL_TABLET | Freq: Every day | ORAL | Status: DC
  Administered 2014-02-25 – 2014-02-26 (×2): 30 mg via ORAL
  Filled 2014-02-25 (×2): qty 1

## 2014-02-25 MED ORDER — ALTEPLASE 2 MG IJ SOLR
2.0000 mg | Freq: Once | INTRAMUSCULAR | Status: AC
  Administered 2014-02-25: 2 mg
  Filled 2014-02-25: qty 2

## 2014-02-25 MED ORDER — POTASSIUM CHLORIDE CRYS ER 20 MEQ PO TBCR
40.0000 meq | EXTENDED_RELEASE_TABLET | ORAL | Status: AC
  Administered 2014-02-25: 40 meq via ORAL

## 2014-02-25 MED ORDER — MILRINONE IN DEXTROSE 20 MG/100ML IV SOLN
0.3750 ug/kg/min | INTRAVENOUS | Status: DC
  Administered 2014-02-25 – 2014-02-26 (×6): 0.375 ug/kg/min via INTRAVENOUS
  Filled 2014-02-25 (×7): qty 100

## 2014-02-25 MED ORDER — LIVING WELL WITH DIABETES BOOK
Freq: Once | Status: AC
  Administered 2014-02-26
  Filled 2014-02-25: qty 1

## 2014-02-25 MED ORDER — ENOXAPARIN SODIUM 80 MG/0.8ML ~~LOC~~ SOLN
70.0000 mg | Freq: Every day | SUBCUTANEOUS | Status: DC
  Filled 2014-02-25: qty 0.8

## 2014-02-25 MED ORDER — DEXTROSE 50 % IV SOLN: 25.0000 mL | INTRAVENOUS | Status: DC | PRN

## 2014-02-25 MED ORDER — DIGOXIN 125 MCG PO TABS
0.1250 mg | ORAL_TABLET | Freq: Every day | ORAL | Status: DC
  Administered 2014-02-25 – 2014-02-26 (×2): 0.125 mg via ORAL
  Filled 2014-02-25 (×2): qty 1

## 2014-02-25 MED ORDER — HYDRALAZINE HCL 25 MG PO TABS
25.0000 mg | ORAL_TABLET | Freq: Three times a day (TID) | ORAL | Status: DC
  Administered 2014-02-25 (×2): 25 mg via ORAL
  Filled 2014-02-25 (×7): qty 1

## 2014-02-25 MED ORDER — SODIUM CHLORIDE 0.9 % IV SOLN
INTRAVENOUS | Status: DC
  Administered 2014-02-25: 7.1 [IU]/h via INTRAVENOUS
  Administered 2014-02-25: 10.5 [IU]/h via INTRAVENOUS
  Filled 2014-02-25: qty 1

## 2014-02-25 NOTE — ED Notes (Signed)
Marcelino Duster is receiving nurse, and is currently reviewing the patient's chart.  She will call back.

## 2014-02-25 NOTE — Progress Notes (Signed)
Steven Wyatt made aware of B/P 94/ 56 Apresoline held as instructed

## 2014-02-25 NOTE — Progress Notes (Signed)
UR completed 

## 2014-02-25 NOTE — ED Notes (Signed)
Patient arrived with Primacor infusing via PICC line as a home med.  Lauren, PA-C is aware.

## 2014-02-25 NOTE — Progress Notes (Signed)
Pt stated he can place on himself when ready. Mask and machine placed within pt reach. Pt encouraged to call RT if needing any assistance or has any questions. No distress noted.

## 2014-02-25 NOTE — H&P (Signed)
[facility] History and Physical  Steven Wyatt ZOX:[id number] DOB: 12-Feb-1976 DOA: 02/24/2014  Referring physician:  EDP PCP: No PCP Per Patient  Specialists:  Dr. Clarise Cruz  Chief Complaint: Elevated Glucose level  HPI: Steven Wyatt is a 38 y.o. male with Multiple Medical problems including Chronic Systolic CHF, Stage III CKD, Borderline DM2, Morbid Obesity and OSA who reports having elevated blood sugars for the past week, and went to see his cardiologist and had labs performed of which returned with a critically elevated glucose level of 839 per the patient report.  He was called and told to go to the ED.   He was evaluated in the ED and was found to have a Glucose level of 713 as well as several other lab abnormalities.  He was placed on the [facility] and referred for medical admission.   He reports that during the past week he had glucose levels in the 400's, and he did not notice any difference in his increased urination because of his diuretic Rx.  He reports being complaint with his medications and medical regimen.      Review of Systems:  Constitutional: + Weight Loss( Intentional), No Weight Gain, Night Sweats, Fevers, Chills, Fatigue, or Generalized Weakness HEENT: No Headaches, Difficulty Swallowing,Tooth/Dental Problems,Sore Throat,  No Sneezing, Rhinitis, Ear Ache, Nasal Congestion, or Post Nasal Drip,  Cardio-vascular:  No Chest pain, Orthopnea, PND, Edema in lower extremities, Anasarca, Dizziness, Palpitations  Resp: No Dyspnea, No DOE, No Productive Cough, No Non-Productive Cough, No Hemoptysis, No Change in Color of Mucus,  No Wheezing.    GI: No Heartburn, Indigestion, Abdominal Pain, Nausea, Vomiting, Diarrhea, Change in Bowel Habits,  Loss of Appetite  GU: No Dysuria, Change in Color of Urine, +Urinary Frequency, No Urgency.  No flank pain.  Musculoskeletal: No Joint Pain or Swelling.  No Decreased Range of Motion. No Back Pain.  Neurologic: No Syncope, No  Seizures, Muscle Weakness, Paresthesia, Vision Disturbance or Loss, No Diplopia, No Vertigo, No Difficulty Walking,  Skin: No Rash or Lesions. Psych: No Change in Mood or Affect. No Depression or Anxiety. No Memory loss. No Confusion or Hallucinations      Past Medical History  Diagnosis Date  . CHF (congestive heart failure)   . Diabetes     "borderline:  . HTN (hypertension)   . Morbid obesity with BMI of 50.0-59.9, adult   . Sleep apnea     on Bi Pap  . Dyslipidemia   . CKD (chronic kidney disease)       History reviewed. No pertinent past surgical history.     Prior to Admission medications   Medication Sig Start Date End Date Taking? Authorizing Provider  albuterol (PROVENTIL HFA;VENTOLIN HFA) 108 (90 BASE) MCG/ACT inhaler Inhale 2 puffs into the lungs daily as needed for wheezing or shortness of breath. 01/08/14  Yes Wilburt Finlay, PA-C  aspirin EC 81 MG tablet Take 81 mg by mouth daily.   Yes Historical Provider, MD  atorvastatin (LIPITOR) 80 MG tablet Take 80 mg by mouth daily. 01/08/14  Yes Wilburt Finlay, PA-C  bumetanide (BUMEX) 2 MG tablet Take 2 tablets (4 mg total) by mouth 2 (two) times daily. 01/16/14  Yes Amy D Clegg, NP  carvedilol (COREG) 3.125 MG tablet Take 1 tablet (3.125 mg total) by mouth 2 (two) times daily. 02/20/14  Yes Dolores Patty, MD  digoxin (LANOXIN) 0.125 MG tablet Take 1 tablet (0.125 mg total) by mouth daily. 01/16/14  Yes Amy D Clegg,  NP  hydrALAZINE (APRESOLINE) 25 MG tablet Take 1 tablet (25 mg total) by mouth every 8 (eight) hours. 01/23/14  Yes Amy D Clegg, NP  isosorbide mononitrate (IMDUR) 30 MG 24 hr tablet Take 1 tablet (30 mg total) by mouth daily. 01/16/14  Yes Amy D Clegg, NP  magnesium oxide (MAG-OX) 400 MG tablet Take 1 tablet (400 mg total) by mouth daily. 02/13/14  Yes Aundria RudAli B Cosgrove, NP  metolazone (ZAROXOLYN) 5 MG tablet Take 1 tablet (5 mg total) by mouth as needed. Weight gain 3 pounds in 24 hours 01/16/14  Yes Amy D Clegg, NP   milrinone (PRIMACOR) 20 MG/100ML SOLN infusion Inject 62.7375 mcg/min into the vein continuous. 01/16/14  Yes Amy D Clegg, NP  potassium chloride SA (K-DUR,KLOR-CON) 20 MEQ tablet Take 3 tablets (60 mEq total) by mouth 2 (two) times daily. Take 40 meq (2 tablets) every am and 20 meq (1 tablet) every pm 02/20/14  Yes Dolores Pattyaniel R Bensimhon, MD      Allergies  Allergen Reactions  . Iodine Anaphylaxis  . Shellfish Allergy Anaphylaxis     Social History:  reports that he has never smoked. He does not have any smokeless tobacco history on file. He reports that he does not drink alcohol or use illicit drugs.     History reviewed. No pertinent family history.     Physical Exam:  GEN:  Pleasant Morbidly Obese  38 y.o. African American male  examined  and in no acute distress; cooperative with exam Filed Vitals:   02/24/14 2323 02/24/14 2330 02/25/14 0030 02/25/14 0100  BP: 122/84 118/100 121/102 127/101  Pulse: 90 92 87 52  Temp:      TempSrc:      Resp: 19 20 16 13   Height:      Weight:      SpO2: 95% 92% 96% 96%   Blood pressure 127/101, pulse 52, temperature 97.9 F (36.6 C), temperature source Oral, resp. rate 13, height 5\' 8"  (1.727 m), weight 152.409 kg (336 lb), SpO2 96.00%. PSYCH: He is alert and oriented x4; does not appear anxious does not appear depressed; affect is normal HEENT: Normocephalic and Atraumatic, Mucous membranes pink; PERRLA; EOM intact; Fundi:  Benign;  No scleral icterus, Nares: Patent, Oropharynx: Clear, Fair Dentition, Neck:  FROM, no cervical lymphadenopathy nor thyromegaly or carotid bruit; no JVD; Breasts:: Not examined CHEST WALL: No tenderness CHEST: Normal respiration, clear to auscultation bilaterally HEART: Regular rate and rhythm; no murmurs rubs or gallops BACK: No kyphosis or scoliosis; no CVA tenderness ABDOMEN: Positive Bowel Sounds,  Obese, soft non-tender; no masses, no organomegaly, +pannus; no intertriginous candida. Rectal Exam: Not  done EXTREMITIES: No bone or joint deformity; age-appropriate arthropathy of the hands and knees; no cyanosis, clubbing or edema; no ulcerations. Genitalia: not examined PULSES: 2+ and symmetric SKIN: Normal hydration no rash or ulceration CNS:  Alert and Oriented x 4, No focal Deficits;  Gait: deferred  Vascular: pulses palpable throughout    Labs on Admission:  Basic Metabolic Panel:  Recent Labs Lab 02/20/14 1119 02/24/14 2200  NA 124* 119*  K 2.5* 2.6*  CL 70* 69*  CO2 35* 35*  GLUCOSE 675* 713*  BUN 27* 27*  CREATININE 1.42* 1.31  CALCIUM 9.9 9.4   Liver Function Tests: No results found for this basename: AST, ALT, ALKPHOS, BILITOT, PROT, ALBUMIN,  in the last 168 hours No results found for this basename: LIPASE, AMYLASE,  in the last 168 hours No results found for this  basename: AMMONIA,  in the last 168 hours CBC:  Recent Labs Lab 02/24/14 2200  WBC 6.4  HGB 13.3  HCT 38.9*  MCV 73.0*  PLT 232   Cardiac Enzymes: No results found for this basename: CKTOTAL, CKMB, CKMBINDEX, TROPONINI,  in the last 168 hours  BNP (last 3 results)  Recent Labs  01/03/14 1248 01/11/14 1121  PROBNP 1164.0* 2156.0*   CBG:  Recent Labs Lab 02/24/14 2346 02/25/14 0024  GLUCAP >600* >600*    Radiological Exams on Admission: Dg Chest 2 View  02/24/2014   CLINICAL DATA:  CHF  EXAM: CHEST  2 VIEW  COMPARISON:  01/11/2014  FINDINGS: Right upper extremity PICC, tip at the upper SVC.  Mild cardiomegaly which is chronic.  Likely central venous congestion, although accentuated by lung volumes. No edema. No consolidation, effusion, or pneumothorax. Negative osseous structures.  IMPRESSION: Venous congestion without pulmonary edema.   Electronically Signed   By: Tiburcio Pea M.D.   On: 02/24/2014 22:57       Assessment/Plan:   38 y.o. male with  Principal Problem:   DKA (diabetic ketoacidoses) Active Problems:   NICM (nonischemic cardiomyopathy)   Morbid obesity with BMI  of 50.0-59.9, adult   Sleep apnea- he reports comliance with Bi Pap   HTN (hypertension)   Dyslipidemia   Hypokalemia   Chronic systolic heart failure   CKD (chronic kidney disease), stage III   Hyponatremia     1.   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  Paced on the IV Insulin Drip, Minimizing IVFs due to his chronic systolic CHF and CKD.     2.   Chronic Systolic CHF-  Currently on a Milrinone Drip, and Bumex Rx, and Coreg along with Digoxin Rx and Imdur Rx.  Sees Cardiologist Dr. Clarise Cruz and the Heart Failure Team, Please Notify The Heart Failure Team in the AM.    3.   Non-Ischemic Cardiomyopathy-  See #2.    4.   Electrolye Disturbances - due to #1, and due to Diuretic Rx and CKD.  Monitor Na+ level , most likely due to Fluid Overload, so restricting IVFs and continue Bumex RX, but hold Metolazone Rx for now, monitor Na+ trend.  Replete K+ Level and monitor due to CKD.  Magnesium level within Normal limits, continue Magnesium home Rx.    5.   CKD Stage III-  BUN/CR   27/1.31 today, his baseline appears to be a creatinine of 2.0.  Monitor Fluids and continue Bumex Rx.    6.   HTN-  Continue Coreg, Bumex, and hydralazine Rx.       7.   OSA- on BIPAP qhs, which should also help with his CHF.  Monitor O2 sats.    8.   Dyslipidemia- Continue Atorvastatin Rx.     9.   DVT prophylaxis with Lovenox.        Code Status:  FULL CODE Family Communication:   No Family Present  Disposition Plan:     Inpatient    Time spent:  86 Minutes  Ron Parker [facility] Pager [phone]  If 7PM-7AM, please contact night-coverage [email or web address] Password [email or web address] 02/25/2014, 1:34 AM

## 2014-02-25 NOTE — Progress Notes (Signed)
Patient placed on Bipap per patient request and according to home regimen for HS.  Nasal mask, 20/14, room air.  Patient is familiar with equipment and procedure.

## 2014-02-25 NOTE — Progress Notes (Addendum)
CRITICAL VALUE ALERT  Critical value received:  Potassium 2.7  Date of notification:  02/25/2014  Time of notification:  0640  Critical value read back:yes  Nurse who received alert:  Gates Rigg, RN   MD notified (1st page): text paged [facility]   Time of first page:  0645  MD notified (2nd page):  Time of second page:  Responding MD:  Lenny Pastel  Time MD responded:  [phone]

## 2014-02-25 NOTE — Progress Notes (Signed)
Followup note:  Patient admitted earlier this morning for new onset nonketotic hyperglycemia in the setting of new diagnosis of diabetes. Insulin drip discontinued by noon once anion gap resolved. Started on 10 units of Lantus. Diet to be advanced. Monitor overnight and continue on milrinone drip. If CBG stabilized by morning, discharged home with new prescription for Lantus. Patient already well versed in diabetes as his wife is on insulin for that. Replace potassium as needed. Repeat basic metabolic panel at 4 PM.

## 2014-02-25 NOTE — Progress Notes (Addendum)
Lenny Pastel NP made aware of  CBG 407. Pt had not eaten dinner at scheduled time and is now eating it.  To administer scheduled dose on Insulin 20units per SS  and then recheck his cbg again around 1:00

## 2014-02-25 NOTE — Progress Notes (Signed)
Placed patient on BIPAP via home settings of IPAP=20 and EPAP=14

## 2014-02-26 DIAGNOSIS — R7309 Other abnormal glucose: Secondary | ICD-10-CM

## 2014-02-26 LAB — BASIC METABOLIC PANEL
BUN: 26 mg/dL — AB (ref 6–23)
CO2: 34 meq/L — AB (ref 19–32)
Calcium: 8.9 mg/dL (ref 8.4–10.5)
Chloride: 81 mEq/L — ABNORMAL LOW (ref 96–112)
Creatinine, Ser: 1.34 mg/dL (ref 0.50–1.35)
GFR calc Af Amer: 78 mL/min — ABNORMAL LOW (ref >90)
GFR, EST NON AFRICAN AMERICAN: 67 mL/min — AB (ref >90)
Glucose, Bld: 317 mg/dL — ABNORMAL HIGH (ref 70–99)
POTASSIUM: 2.9 meq/L — AB (ref 3.7–5.3)
Sodium: 128 mEq/L — ABNORMAL LOW (ref 137–147)

## 2014-02-26 LAB — GLUCOSE, CAPILLARY
GLUCOSE-CAPILLARY: 323 mg/dL — AB (ref 70–99)
GLUCOSE-CAPILLARY: 333 mg/dL — AB (ref 70–99)
Glucose-Capillary: 275 mg/dL — ABNORMAL HIGH (ref 70–99)
Glucose-Capillary: 375 mg/dL — ABNORMAL HIGH (ref 70–99)

## 2014-02-26 MED ORDER — POTASSIUM CHLORIDE 10 MEQ/100ML IV SOLN
10.0000 meq | INTRAVENOUS | Status: AC
  Administered 2014-02-26 (×2): 10 meq via INTRAVENOUS
  Filled 2014-02-26 (×2): qty 100

## 2014-02-26 MED ORDER — ENOXAPARIN SODIUM 80 MG/0.8ML ~~LOC~~ SOLN
80.0000 mg | Freq: Every day | SUBCUTANEOUS | Status: DC
  Administered 2014-02-26: 80 mg via SUBCUTANEOUS
  Filled 2014-02-26: qty 0.8

## 2014-02-26 MED ORDER — FREESTYLE SYSTEM KIT: 1.0000 | PACK | Status: DC | PRN

## 2014-02-26 MED ORDER — INSULIN GLARGINE 100 UNIT/ML ~~LOC~~ SOLN: 25.0000 [IU] | Freq: Every day | SUBCUTANEOUS | Status: DC

## 2014-02-26 MED ORDER — INSULIN ASPART 100 UNIT/ML ~~LOC~~ SOLN
15.0000 [IU] | Freq: Once | SUBCUTANEOUS | Status: AC
  Administered 2014-02-26: 15 [IU] via SUBCUTANEOUS

## 2014-02-26 NOTE — Discharge Instructions (Signed)
Diabetic Ketoacidosis °Diabetic ketoacidosis (DKA) is a life-threatening complication of type 1 diabetes. It must be quickly recognized and treated. Treatment requires hospitalization. °CAUSES  °When there is no insulin in the body, glucose (sugar) cannot be used and the body breaks down fat for energy. When fat breaks down, acids (ketones) build up in the blood. Very high levels of glucose and high levels of acids lead to severe loss of body fluids (dehydration) and other dangerous chemical changes. This stresses your vital organs and can cause coma or death. °SYMPTOMS  °· Tiredness (fatigue). °· Weight loss. °· Excessive thirst. °· Ketones in the urine. °· Lightheadedness. °· Fruity or sweet smell on your breath. °· Excessive urination. °· Visual changes. °· Confusion or irritability. °· Feeling sick to your stomach (nauseous) or vomiting. °· Rapid breathing. °· Stomachache or belly (abdominal) pain. °DIAGNOSIS  °Your caregiver will diagnose DKA based on your history, physical exam, and blood tests. Your caregiver will check if there is another illness present which caused you to go into DKA. Most of this will be done quickly in an emergency room. °TREATMENT  °· Fluid replacement to correct dehydration. °· Insulin. °· Correction of electrolytes, such as potassium and sodium. °· Medicines (antibiotics) that kill germs for infections. °PREVENTION °· Always take your insulin. Do not skip your insulin injections. °· If you are ill, treat yourself quickly. Your body often needs more insulin to fight the illness. °· Check your blood glucose regularly. °· Check urine ketones if your blood glucose is greater than 240 milligrams per deciliter (mg/dl). °· Do not used expired or outdated insulin. °· If your blood glucose is high, drink plenty of fluids. This helps flush out ketones. °HOME CARE INSTRUCTIONS  °· If you are ill, follow the advice of your caregiver. °· To prevent loss of body fluids (dehydration), drink enough  water and fluids to keep your urine clear or pale yellow. °· If you cannot eat, alternate between drinking fluids with sugar (soda, juices, flavored gelatin) and salty fluids (broth, bouillon). °· If you can eat, follow your usual diet and drink sugar-free liquids (water, diet drinks). °· Always take your usual dose of insulin. If you cannot eat, or your glucose is getting too low, call your caregiver for further instructions. °· Continue to monitor your blood or urine ketones every 3 to 4 hours around the clock. Set your alarm clock or have someone wake you up. If you are too sick, have someone test it for you. °· Rest and avoid exercise. °SEEK MEDICAL CARE IF:  °· You have ketones in your urine or your blood glucose is higher than a level your caregiver suggests. You may need extra insulin. Call your caregiver if you need advice on adjusting your insulin. °· You cannot drink at least a tablespoon of fluid every 15 to 20 minutes. °· You have been throwing up for more than 2 hours. °· You have symptoms of DKA: °· Fruity smelling breath. °· Breathing faster or slower. °· Becoming very sleepy. °SEEK IMMEDIATE MEDICAL CARE IF:  °· You have signs of dehydration: °· Decreased urination. °· Increased thirst. °· Dry skin and mouth. °· Lightheadedness. °· Your blood glucose is very high (as advised by your caregiver) twice in a row. °· You or your child has an oral temperature above 102° F (38.9° C), not controlled by medicine. °· You pass out. °· You have chest pain and/or trouble breathing. °· You have a sudden, severe headache. °· You have sudden   weakness in one arm and/or one leg. °· You have sudden difficulty speaking and/or swallowing. °· You develop vomiting and/or diarrhea that is getting worse after 3 to 4 hours. °· You have abdominal pain. °MAKE SURE YOU:  °· Understand these instructions. °· Will watch your condition. °· Will get help right away if you are not doing well or get worse. °Document Released:  12/05/2000 Document Revised: 03/01/2012 Document Reviewed: 06/13/2009 °[employer]® Patient Information ©2014 [employer]. ° °

## 2014-02-26 NOTE — Discharge Summary (Signed)
Physician Discharge Summary  Steven Wyatt UMP:[id number] DOB: 01-08-1976 DOA: 02/24/2014  PCP: No PCP Per Patient patient has been referred to [facility] internal medicine at [facility] He is established with the CHF clinic at [place] date: 02/24/2014 Discharge date: 02/26/2014  Time spent: 25 minutes  Recommendations for Outpatient Follow-up:  1. New medications: Lantus 25 units subcutaneous each bedtime  Discharge Diagnoses:  Principal Problem:   DKA (diabetic ketoacidoses) Active Problems:   NICM (nonischemic cardiomyopathy)   Morbid obesity with BMI of 50.0-59.9, adult   Sleep apnea- he reports comliance with Bi Pap   HTN (hypertension)   Dyslipidemia   Hypokalemia   Chronic systolic heart failure   CKD (chronic kidney disease), stage III   Hyponatremia   Discharge Condition: improved, being discharged home  Diet recommendation: carb modified heart healthy  Filed Weights   02/24/14 2139 02/25/14 0147 02/26/14 0400  Weight: 152.409 kg (336 lb) 156.4 kg (344 lb 12.8 oz) 154.7 kg (341 lb 0.8 oz)    History of present illness:  38 year old African American male with past medical history of morbid obesity, obstructive sleep apnea, stage III chronic kidney disease and advanced systolic CHF who had no previous diagnosis of diabetes, but had had elevated blood sugars when checked by home health agency. When he went to see his cardiologist, labs were done and he was told his blood sugar was 839. Patient himself has noted no new symptoms. He thought that his increased urination was from his diuretics. Patient was sent over to the emergency room which confirmed elevated blood sugars. Patient was started on an insulin drip and admitted to the hospitalist service.  Hospital Course:  Principal Problem:   DKA (diabetic ketoacidoses)/new diagnosis diabetes mellitus type 2 uncontrolled: Patient is continued on insulin drip plus minimal IV fluids given his heart failure. His anion gap was  normalized by 3/7 afternoon. At this point, he he received 10 units of Lantus subcutaneous) on sliding scale. That night he received an additional 15 units. The following morning, patient's CBGs were in the mid to high 200s/low 300s. However, anion gap is normal.  Plan will be for patient be discharged on 25 units of Lantus subcutaneous each bedtime. He started quite familiar with diabetes given his wife has diabetes. He was given a prescription for Lantus, plus glucose meter plus strips and surges.  Active Problems:   NICM (nonischemic cardiomyopathy): Stable. See below.   Morbid obesity with BMI of 50.0-59.9, adult: Patient needs criteriagiven documented BMI of 52.5.    Sleep apnea- he reports compliance with Bi Pap: Stable. Patient was continued on his home CPAP machine while in the hospital.    HTN (hypertension): Stable. Continue home meds.    Dyslipidemia: Stable continue on statin   Hypokalemia: Secondary to hyperglycemia corrected with insulin. Replaced. Magnesium level checked and found to be normal. Patient will continue on home doses of potassium because of large diuretics    Chronic systolic heart failure: Stable. Patient was continued on IV milrinone drip which will continue at home. Continue diuretics.    CKD (chronic kidney disease), stage III: Creatinine stable. On day of discharge at 1.34    Hyponatremia: pseudohyponatremia from hyperglycemia.  Procedures:   none  Consultations:  none  Discharge Exam: Filed Vitals:   02/26/14 1135  BP: 111/91  Pulse: 101  Temp: 98.6 F (37 C)  Resp: 18    General: A&O x 3, NAD Cardiovascular: RRR S1.S2,  Respiratory: decreased breath sounds throughout  secondary to body habitus  Discharge Instructions  Discharge Orders   Future Appointments Provider Department Dept Phone   03/20/2014 11:00 AM [facility] [phone]   03/20/2014 12:00 PM [place] [phone]   Future Orders Complete By Expires   Diet - low sodium heart healthy  As directed    Diet Carb Modified  As directed    Increase activity slowly  As directed        Medication List         albuterol 108 (90 BASE) MCG/ACT inhaler  Commonly known as:  PROVENTIL HFA;VENTOLIN HFA  Inhale 2 puffs into the lungs daily as needed for wheezing or shortness of breath.     aspirin EC 81 MG tablet  Take 81 mg by mouth daily.     atorvastatin 80 MG tablet  Commonly known as:  LIPITOR  Take 80 mg by mouth daily.     bumetanide 2 MG tablet  Commonly known as:  BUMEX  Take 2 tablets (4 mg total) by mouth 2 (two) times daily.     carvedilol 3.125 MG tablet  Commonly known as:  COREG  Take 1 tablet (3.125 mg total) by mouth 2 (two) times daily.     digoxin 0.125 MG tablet  Commonly known as:  LANOXIN  Take 1 tablet (0.125 mg total) by mouth daily.     glucose monitoring kit monitoring kit  1 each by Does not apply route as needed for other.     hydrALAZINE 25 MG tablet  Commonly known as:  APRESOLINE  Take 1 tablet (25 mg total) by mouth every 8 (eight) hours.     insulin glargine 100 UNIT/ML injection  Commonly known as:  LANTUS  Inject 0.25 mLs (25 Units total) into the skin at bedtime.     isosorbide mononitrate 30 MG 24 hr tablet  Commonly known as:  IMDUR  Take 1 tablet (30 mg total) by mouth daily.     magnesium oxide 400 MG tablet  Commonly known as:  MAG-OX  Take 1 tablet (400 mg total) by mouth daily.     metolazone 5 MG tablet  Commonly known as:  ZAROXOLYN  Take 1 tablet (5 mg total) by mouth as needed. Weight gain 3 pounds in 24 hours     milrinone 20 MG/100ML Soln infusion  Commonly known as:  PRIMACOR  Inject 62.7375 mcg/min into the vein continuous.     potassium chloride SA 20 MEQ tablet  Commonly known as:  K-DUR,KLOR-CON  Take 3 tablets (60 mEq total) by mouth 2 (two) times daily. Take 40 meq (2 tablets)  every am and 20 meq (1 tablet) every pm       Allergies  Allergen Reactions  . Iodine Anaphylaxis  . Shellfish Allergy Anaphylaxis       Follow-up Information   Follow up with JARALLA SHAMLEFFER, IBTEHAL, MD In 1 month.   Specialty:  Internal Medicine   Contact information:   [address]  [address]  [place] [address] [phone]       Follow up with Cardiology Clinic as scheduled.       The results of significant diagnostics from this hospitalization (including imaging, microbiology, ancillary and laboratory) are listed below for reference.    Significant Diagnostic Studies: Dg Chest 2 View  02/24/2014   CLINICAL DATA:  CHF  EXAM: CHEST  2 VIEW  COMPARISON:  01/11/2014  FINDINGS: Right upper extremity PICC, tip at the upper SVC.  Mild cardiomegaly which is chronic.  Likely central venous congestion, although accentuated by lung volumes. No edema. No consolidation, effusion, or pneumothorax. Negative osseous structures.  IMPRESSION: Venous congestion without pulmonary edema.   Electronically Signed   By: Tiburcio Pea M.D.   On: 02/24/2014 22:57   Ir Fluoro Guide Cv Line Right  02/03/2014   CLINICAL DATA:  Chronic heart failure and needs new PICC line for milrinone therapy.  EXAM: PERIPHERALLY INSERTED CENTRAL VENOUS CATHETER (PICC) PLACEMENT WITH ULTRASOUND AND FLUOROSCOPIC GUIDANCE  TECHNIQUE: The procedure was explained to the patient. The risks and benefits of the procedure were discussed and the patient's questions were addressed. Informed consent was obtained from the patient.  The right arm was prepped with chlorhexidine, draped in the usual sterile fashion using maximum barrier technique (cap and mask, sterile gown, sterile gloves, large sterile sheet, hand hygiene and cutaneous antiseptic). Local anesthesia was attained by infiltration with 1% lidocaine.  Ultrasound demonstrated patency of the right brachial vein, and this was documented with an image. Under real-time  ultrasound guidance, this vein was accessed with a 21 gauge micropuncture needle and image documentation was performed. The needle was exchanged over a guidewire for a peel-away sheath through which a 40 cm 5 [place] dual lumen power injectable PICC was advanced, and positioned with its tip at the lower SVC/right atrial junction. Fluoroscopy during the procedure and fluoro spot radiograph confirms appropriate catheter position. The catheter was flushed, secured to the skin with Prolene sutures, and covered with a sterile dressing.  FLUOROSCOPY TIME:  30 seconds  COMPLICATIONS: None.  The patient tolerated the procedure well.  IMPRESSION: Successful placement of a right arm PICC with sonographic and fluoroscopic guidance. The catheter is ready for use.   Electronically Signed   By: Richarda Overlie M.D.   On: 02/03/2014 14:24   Ir US Guide Vasc Access Right  02/03/2014   CLINICAL DATA:  Chronic heart failure and needs new PICC line for milrinone therapy.  EXAM: PERIPHERALLY INSERTED CENTRAL VENOUS CATHETER (PICC) PLACEMENT WITH ULTRASOUND AND FLUOROSCOPIC GUIDANCE  TECHNIQUE: The procedure was explained to the patient. The risks and benefits of the procedure were discussed and the patient's questions were addressed. Informed consent was obtained from the patient.  The right arm was prepped with chlorhexidine, draped in the usual sterile fashion using maximum barrier technique (cap and mask, sterile gown, sterile gloves, large sterile sheet, hand hygiene and cutaneous antiseptic). Local anesthesia was attained by infiltration with 1% lidocaine.  Ultrasound demonstrated patency of the right brachial vein, and this was documented with an image. Under real-time ultrasound guidance, this vein was accessed with a 21 gauge micropuncture needle and image documentation was performed. The needle was exchanged over a guidewire for a peel-away sheath through which a 40 cm 5 [place] dual lumen power injectable PICC was advanced, and  positioned with its tip at the lower SVC/right atrial junction. Fluoroscopy during the procedure and fluoro spot radiograph confirms appropriate catheter position. The catheter was flushed, secured to the skin with Prolene sutures, and covered with a sterile dressing.  FLUOROSCOPY TIME:  30 seconds  COMPLICATIONS: None.  The patient tolerated the procedure well.  IMPRESSION: Successful placement of a right arm PICC with sonographic and fluoroscopic guidance. The catheter is ready for use.   Electronically Signed   By: Richarda Overlie M.D.   On: 02/03/2014 14:24    Microbiology: Recent Results (from  the past 240 hour(s))  MRSA PCR SCREENING     Status: None   Collection Time    02/25/14  2:00 AM      Result Value Ref Range Status   MRSA by PCR NEGATIVE  NEGATIVE Final   Comment:            The GeneXpert MRSA Assay (FDA     approved for NASAL specimens     only), is one component of a     comprehensive MRSA colonization     surveillance program. It is not     intended to diagnose MRSA     infection nor to guide or     monitor treatment for     MRSA infections.     Labs: Basic Metabolic Panel:  Recent Labs Lab 02/24/14 2200 02/25/14 0245 02/25/14 0520 02/25/14 0903 02/25/14 1650 02/26/14 0502  NA 119*  --  126* 128* 124* 128*  K 2.6*  --  2.7* 2.8* 2.9* 2.9*  CL 69*  --  77* 80* 78* 81*  CO2 35*  --  39* 36* 33* 34*  GLUCOSE 713*  --  314* 192* 409* 317*  BUN 27*  --  $R'22 20 23 'oF$ 26*  CREATININE 1.31  --  1.19 1.09 1.27 1.34  CALCIUM 9.4  --  9.3 9.2 9.1 8.9  MG  --  2.7*  --   --   --   --    Liver Function Tests: No results found for this basename: AST, ALT, ALKPHOS, BILITOT, PROT, ALBUMIN,  in the last 168 hours No results found for this basename: LIPASE, AMYLASE,  in the last 168 hours No results found for this basename: AMMONIA,  in the last 168 hours CBC:  Recent Labs Lab 02/24/14 2200  WBC 6.4  HGB 13.3  HCT 38.9*  MCV 73.0*  PLT 232   Cardiac Enzymes: No  results found for this basename: CKTOTAL, CKMB, CKMBINDEX, TROPONINI,  in the last 168 hours BNP: BNP (last 3 results)  Recent Labs  01/03/14 1248 01/11/14 1121  PROBNP 1164.0* 2156.0*   CBG:  Recent Labs Lab 02/25/14 2056 02/26/14 0049 02/26/14 0506 02/26/14 0816 02/26/14 1136  GLUCAP 407* 333* 323* 275* 375*       Signed:  Falisa Lamora K  [facility] 02/26/2014, 1:54 PM

## 2014-02-27 LAB — GLUCOSE, CAPILLARY: Glucose-Capillary: >600 mg/dL (ref 70–99)

## 2014-02-27 NOTE — Telephone Encounter (Signed)
Pt was in the hosptial over the weekend w/DM, [facility] is working to get him set up with PCP and they will recheck bmet on Wed

## 2014-03-04 ENCOUNTER — Telehealth: Payer: Self-pay | Admitting: Physician Assistant

## 2014-03-04 NOTE — Telephone Encounter (Signed)
Called by RN at [facility] re: patient's blood sugar. BMET on Wednesday of this past week was 509. He was recently admitted for DKA and started on Lantus 25 units qhs. He has continued to take this as prescribed. He states "I feel fine." [facility] is actively coordinating PCP follow-up. He attributes the blood glucose spikes to milrinone. BG today 494. Advised to continue to follow a carb modified diet and pursue PCP follow-up to facilitate an insulin regimen which provides optimal glycemic control. He understood and agreed.    Jacqulyn Bath, PA-C 03/04/2014 11:13 AM

## 2014-03-06 IMAGING — CR DG CHEST 2V
2 series · 2 of 2 positions shown · non-contrast
Comparison: 01/11/2014

CLINICAL DATA: CHF

EXAM:
CHEST  2 VIEW

[w chest pa *]
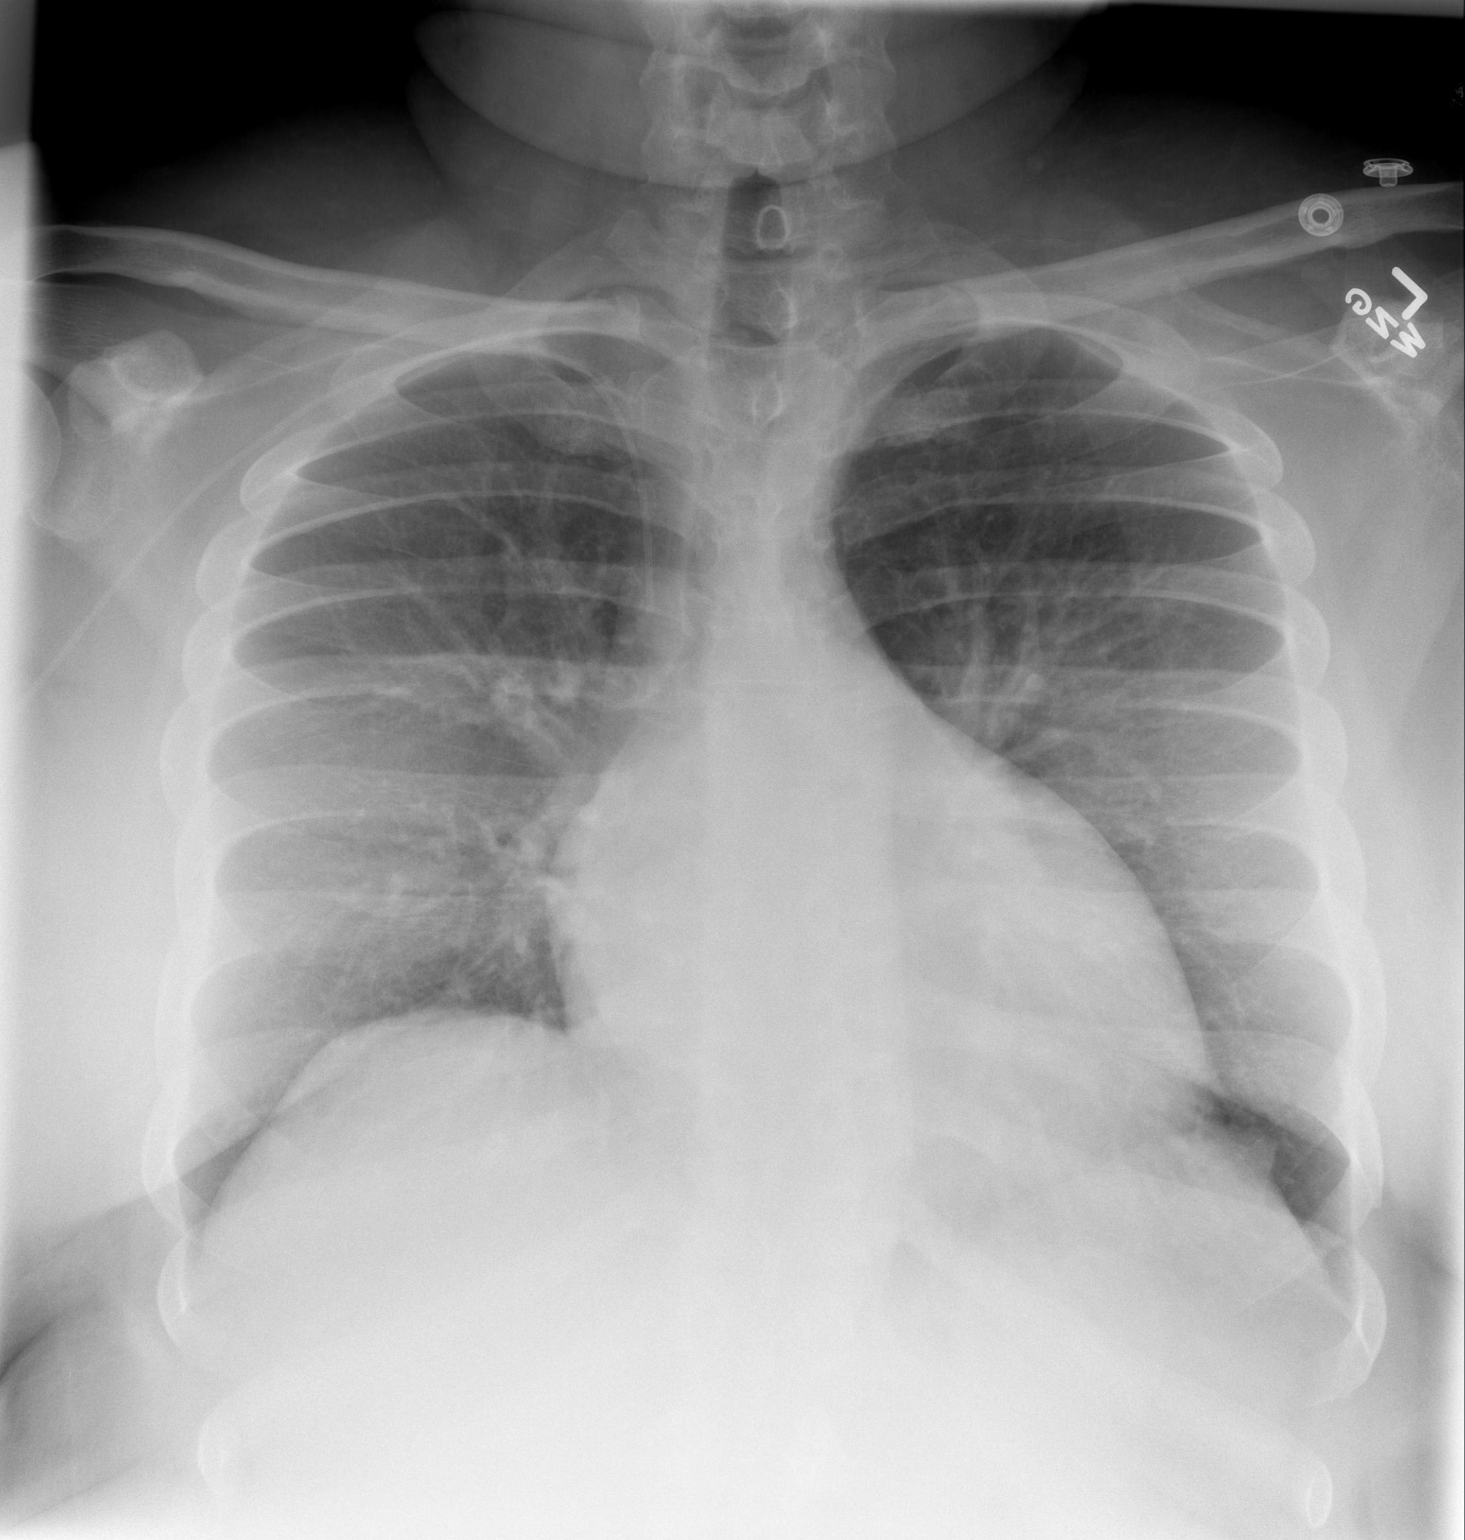

[w chest lat *]
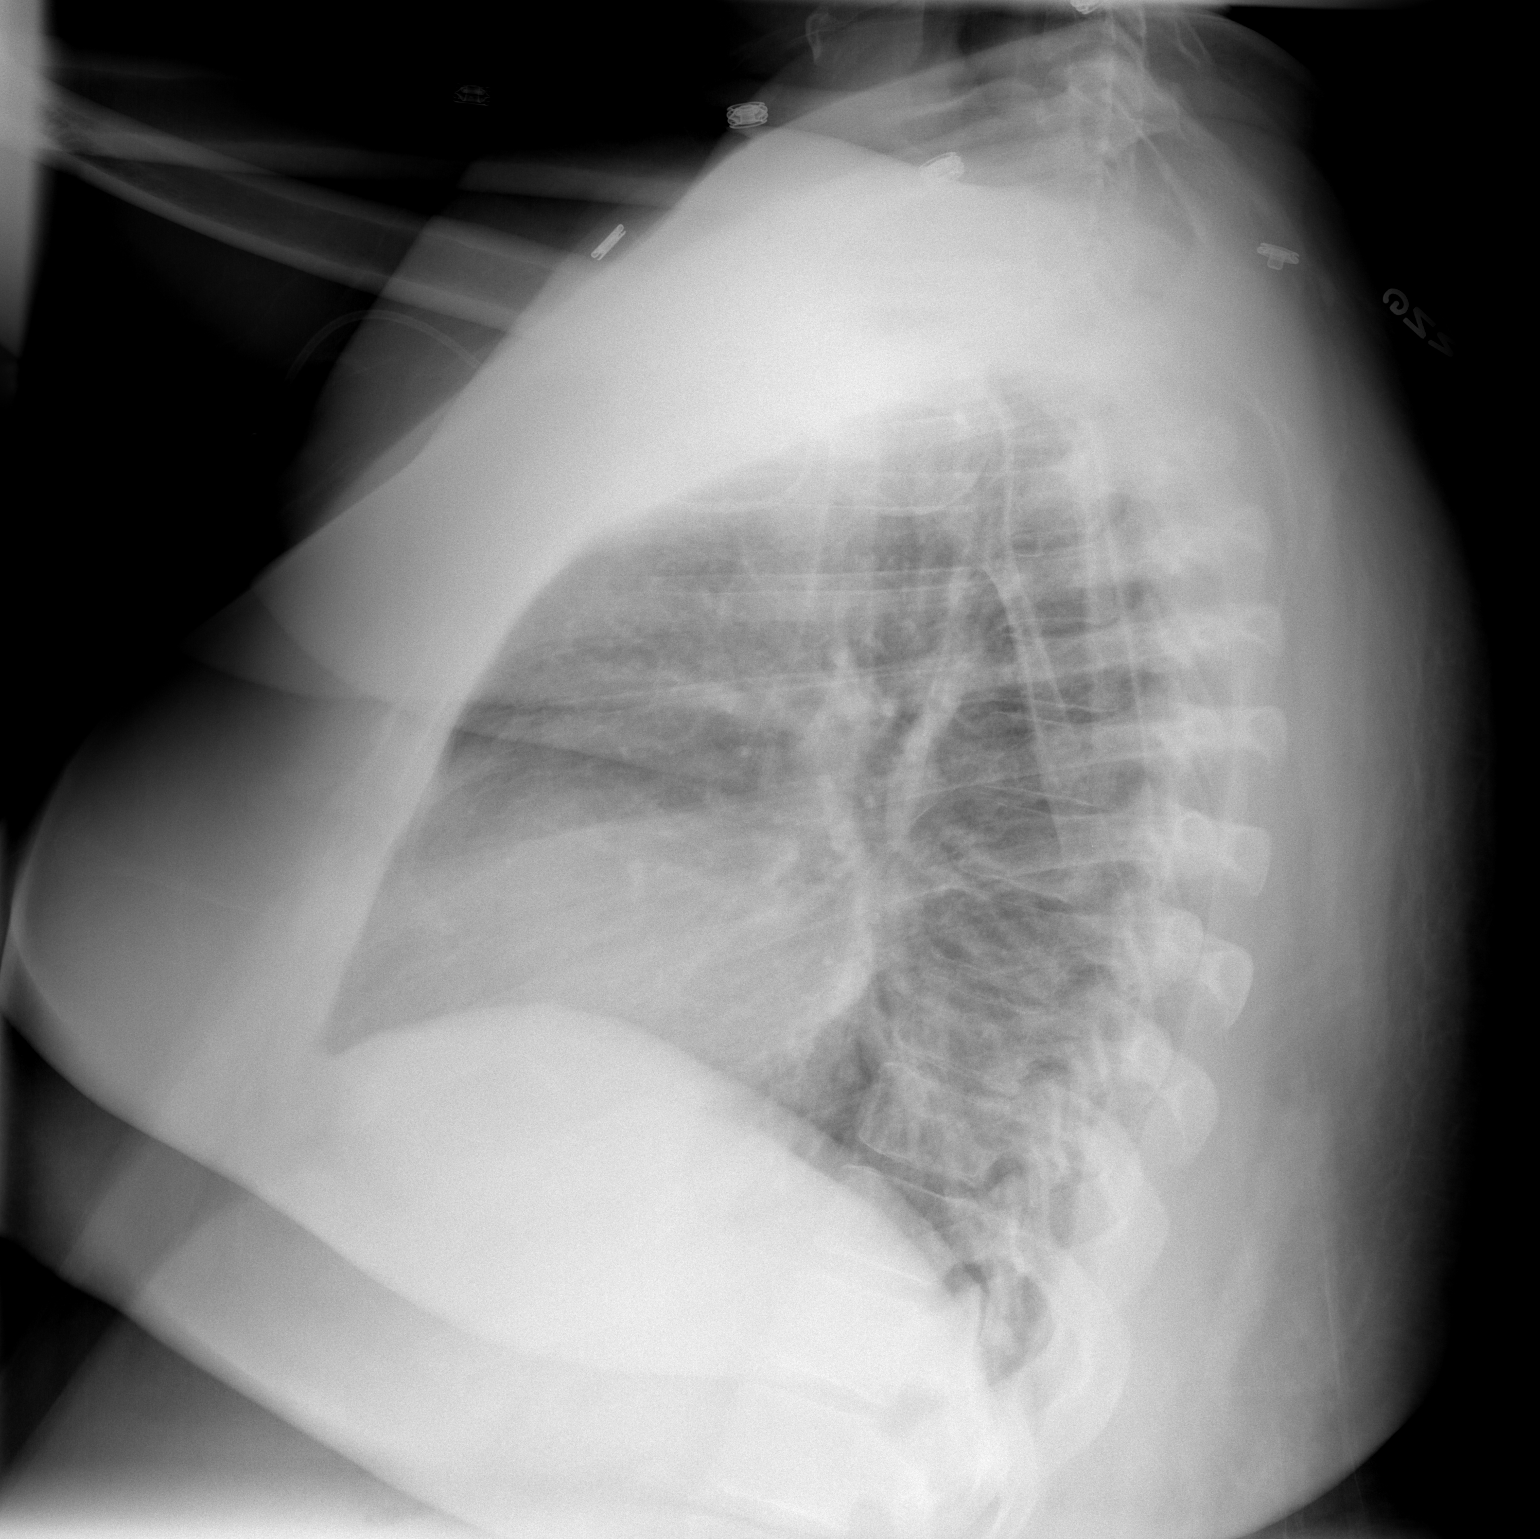

[2 of 2 positions shown; findings below may reference images not displayed]

FINDINGS: Right upper extremity PICC, tip at the upper SVC.

Mild cardiomegaly which is chronic.

Likely central venous congestion, although accentuated by lung
volumes. No edema. No consolidation, effusion, or pneumothorax.
Negative osseous structures.
IMPRESSION: Venous congestion without pulmonary edema.

## 2014-03-07 ENCOUNTER — Telehealth (HOSPITAL_COMMUNITY): Payer: Self-pay | Admitting: Anesthesiology

## 2014-03-07 MED ORDER — MAGNESIUM OXIDE 400 MG PO TABS: 400.0000 mg | ORAL_TABLET | Freq: Two times a day (BID) | ORAL | Status: AC

## 2014-03-07 NOTE — Telephone Encounter (Signed)
Called patient about labs.   Na 125 K+ 3.8 Glucose 509 Cr 1.4  Mag 1.7  Called patient about labs. Very concerned about Glucose. He was recently admitted for DKA and does not currently have PCP. Have asked him if has found PCP and he states he has not. Reports blood sugar at home is 200s today. Will send info to SW to help try and find PCP. Have asked him to also increase Mag oxide to 400 mg BID. Pt aware.   Ulla Potash B NP-C 4:26 PM

## 2014-03-08 ENCOUNTER — Telehealth: Payer: Self-pay | Admitting: Licensed Clinical Social Worker

## 2014-03-08 ENCOUNTER — Encounter: Payer: Self-pay | Admitting: Internal Medicine

## 2014-03-08 NOTE — Telephone Encounter (Signed)
CSW referred to assist patient with obtaining a PCP. CSW contacted patient by phone to follow up on PCP appointment. Patient reports he made an appointment yesterday afternoon with [facility] for Thursday 03/09/14 at 10am. Patient requested assistance with transportation to appointment. CSW provided number for possible transportation through medicaid. CSW will follow up with patient on next HF clinic appointment. Lasandra Beech, LCSW [phone]

## 2014-03-09 ENCOUNTER — Encounter: Payer: Self-pay | Admitting: Physician Assistant

## 2014-03-09 ENCOUNTER — Ambulatory Visit (INDEPENDENT_AMBULATORY_CARE_PROVIDER_SITE_OTHER): Payer: Medicare Other | Admitting: Physician Assistant

## 2014-03-09 VITALS — BP 112/84 | HR 93 | Temp 97.9°F | Ht 66.5 in | Wt 347.0 lb

## 2014-03-09 DIAGNOSIS — E119 Type 2 diabetes mellitus without complications: Secondary | ICD-10-CM

## 2014-03-09 MED ORDER — INSULIN ASPART 100 UNIT/ML FLEXPEN: 5.0000 [IU] | PEN_INJECTOR | Freq: Three times a day (TID) | SUBCUTANEOUS | Status: DC

## 2014-03-09 MED ORDER — INSULIN PEN NEEDLE 32G X 4 MM MISC: Status: DC

## 2014-03-09 NOTE — Progress Notes (Signed)
Pre visit review using our clinic review tool, if applicable. No additional management support is needed unless otherwise documented below in the visit note. 

## 2014-03-09 NOTE — Progress Notes (Signed)
Subjective:    Patient ID: Steven Wyatt, male    DOB: 1976-11-14, 38 y.o.   MRN: [id number]  HPI Comments: Patient is a 38 year old male who presents to the office today for his uncontrolled diabetes. Patient is new to me and is scheduled for a new patient visit at the end of April. 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 Reports on 02/24/14 was sent to ED by his home health provider for a blood glucose level in the 800, states when arriving to the ED it was elevated to above 1000. Was admitted and managed as inpatient. While inpatient, patient received diabetic and nutrition counseling. Prior to admission patient was only on Lantus 25 units daily and states he uses as instructed. At time of discharge he was instructed to start a short acting as well. Is here today for prescription for short acting insulin. States his recent blood sugar readings have been in the 096'G just not certain if fasting, before/after meals or before/after insulin. Denies chest pain/palpitations, SOB, lightheaded, dizziness, weakness, numbness, tingling, visual change/disturbance.    Review of Systems  Constitutional: Negative for fever.  Eyes: Negative for pain and visual disturbance.  Respiratory: Negative for shortness of breath.   Cardiovascular: Negative for chest pain and palpitations.  Gastrointestinal: Negative for nausea and vomiting.  Neurological: Negative for dizziness, weakness, light-headedness and headaches.   Past Medical History  Diagnosis Date  . CHF (congestive heart failure)   . Diabetes   . HTN (hypertension)   . Morbid obesity with BMI of 50.0-59.9, adult   . Sleep apnea     on Bi Pap  . Dyslipidemia   . CKD (chronic kidney disease), stage III   . Acute on chronic systolic CHF (congestive heart failure) 01/03/2014  . Asthma 01/03/2014  . Chronic systolic heart failure 07/24/6628  . DKA (diabetic ketoacidoses) 02/25/2014  . Hypokalemia 01/04/2014  . Hyponatremia 02/25/2014  .  Microcytic anemia 01/04/2014  . 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 Normal coronary arteries- last cath Oct 2014 at [facility] 01/03/2014  . History of chicken pox    Family History  Problem Relation Age of Onset  . Hypertension Mother   . Diabetes Maternal Grandmother    History   Social History  . Marital Status: Married    Spouse Name: N/A    Number of Children: N/A  . Years of Education: N/A   Social History Main Topics  . Smoking status: Never Smoker   . Smokeless tobacco: None  . Alcohol Use: No  . Drug Use: No  . Sexual Activity: Yes   Other Topics Concern  . None   Social History Narrative  . None   No past surgical history on file. Allergies  Allergen Reactions  . Iodine Anaphylaxis  . Shellfish Allergy Anaphylaxis   Current Outpatient Prescriptions on File Prior to Visit  Medication Sig Dispense Refill  . albuterol (PROVENTIL HFA;VENTOLIN HFA) 108 (90 BASE) MCG/ACT inhaler Inhale 2 puffs into the lungs daily as needed for wheezing or shortness of breath.      Marland Kitchen aspirin EC 81 MG tablet Take 81 mg by mouth daily.      Marland Kitchen atorvastatin (LIPITOR) 80 MG tablet Take 80 mg by mouth daily.      . bumetanide (BUMEX) 2 MG tablet Take 2 tablets (4 mg total) by mouth 2 (two) times daily.  120 tablet  6  . carvedilol (COREG) 3.125 MG tablet Take 1 tablet (3.125 mg  total) by mouth 2 (two) times daily.  60 tablet  3  . digoxin (LANOXIN) 0.125 MG tablet Take 1 tablet (0.125 mg total) by mouth daily.  30 tablet  6  . glucose monitoring kit (FREESTYLE) monitoring kit 1 each by Does not apply route as needed for other.  1 each  0  . hydrALAZINE (APRESOLINE) 25 MG tablet Take 1 tablet (25 mg total) by mouth every 8 (eight) hours.  90 tablet  6  . insulin glargine (LANTUS) 100 UNIT/ML injection Inject 0.25 mLs (25 Units total) into the skin at bedtime.  10 mL  11  . isosorbide mononitrate (IMDUR) 30 MG 24 hr tablet Take 1 tablet (30 mg total) by mouth daily.  30 tablet  6    . magnesium oxide (MAG-OX) 400 MG tablet Take 1 tablet (400 mg total) by mouth 2 (two) times daily.  60 tablet  3  . metolazone (ZAROXOLYN) 5 MG tablet Take 1 tablet (5 mg total) by mouth as needed. Weight gain 3 pounds in 24 hours  8 tablet  6  . milrinone (PRIMACOR) 20 MG/100ML SOLN infusion Inject 62.7375 mcg/min into the vein continuous.  100 mL  6  . potassium chloride SA (K-DUR,KLOR-CON) 20 MEQ tablet Take 3 tablets (60 mEq total) by mouth 2 (two) times daily. Take 40 meq (2 tablets) every am and 20 meq (1 tablet) every pm  90 tablet  3   No current facility-administered medications on file prior to visit.       Objective:   Physical Exam  Vitals reviewed. Constitutional: He is oriented to person, place, and time. He appears well-developed and well-nourished. No distress.  HENT:  Head: Normocephalic and atraumatic.  Right Ear: External ear normal.  Left Ear: External ear normal.  Eyes: Conjunctivae are normal. No scleral icterus.  Neck: Normal range of motion.  Cardiovascular: Normal rate and regular rhythm.  Exam reveals no gallop and no friction rub.   No murmur heard. Pulmonary/Chest: Effort normal and breath sounds normal.  Musculoskeletal: Normal range of motion.  Neurological: He is alert and oriented to person, place, and time.  Skin: Skin is warm and dry. He is not diaphoretic.  Psychiatric: He has a normal mood and affect.   Filed Vitals:   03/09/14 1024  BP: 112/84  Pulse: 93  Temp: 97.9 F (36.6 C)   Wt Readings from Last 3 Encounters:  03/09/14 347 lb (157.398 kg)  02/26/14 341 lb 0.8 oz (154.7 kg)  02/20/14 339 lb 12.8 oz (154.132 kg)       Assessment & Plan:    Diabetes: Patient provided instruction and demonstration for Novolog Flexpen Instructed to use 5 units three times daily with meals until seen by Endocrinology. Referral to Endocrinology for further evaluation and treatment plan.  Patient is scheduled for new patient visit on 04/20/14.  Instructed to keep appointment as well as other appointments with cardiology  Patient states understanding and agreement with treatment plan.

## 2014-03-09 NOTE — Patient Instructions (Signed)
It was great meeting you today [name]!  I have sent a prescription for the Novolog flexpen to your pharmacy. You are to begin with 5 units three times a day with meals.  I have placed a referral to Endocrinology for further evaluation.    Diabetes and Foot Care Diabetes may cause you to have problems because of poor blood supply (circulation) to your feet and legs. This may cause the skin on your feet to become thinner, break easier, and heal more slowly. Your skin may become dry, and the skin may peel and crack. You may also have nerve damage in your legs and feet causing decreased feeling in them. You may not notice minor injuries to your feet that could lead to infections or more serious problems. Taking care of your feet is one of the most important things you can do for yourself.  HOME CARE INSTRUCTIONS  Wear shoes at all times, even in the house. Do not go barefoot. Bare feet are easily injured.  Check your feet daily for blisters, cuts, and redness. If you cannot see the bottom of your feet, use a mirror or ask someone for help.  Wash your feet with warm water (do not use hot water) and mild soap. Then pat your feet and the areas between your toes until they are completely dry. Do not soak your feet as this can dry your skin.  Apply a moisturizing lotion or petroleum jelly (that does not contain alcohol and is unscented) to the skin on your feet and to dry, brittle toenails. Do not apply lotion between your toes.  Trim your toenails straight across. Do not dig under them or around the cuticle. File the edges of your nails with an emery board or nail file.  Do not cut corns or calluses or try to remove them with medicine.  Wear clean socks or stockings every day. Make sure they are not too tight. Do not wear knee-high stockings since they may decrease blood flow to your legs.  Wear shoes that fit properly and have enough cushioning. To break in new shoes, wear them for just a few  hours a day. This prevents you from injuring your feet. Always look in your shoes before you put them on to be sure there are no objects inside.  Do not cross your legs. This may decrease the blood flow to your feet.  If you find a minor scrape, cut, or break in the skin on your feet, keep it and the skin around it clean and dry. These areas may be cleansed with mild soap and water. Do not cleanse the area with peroxide, alcohol, or iodine.  When you remove an adhesive bandage, be sure not to damage the skin around it.  If you have a wound, look at it several times a day to make sure it is healing.  Do not use heating pads or hot water bottles. They may burn your skin. If you have lost feeling in your feet or legs, you may not know it is happening until it is too late.  Make sure your health care provider performs a complete foot exam at least annually or more often if you have foot problems. Report any cuts, sores, or bruises to your health care provider immediately. SEEK MEDICAL CARE IF:   You have an injury that is not healing.  You have cuts or breaks in the skin.  You have an ingrown nail.  You notice redness on your legs  or feet.  You feel burning or tingling in your legs or feet.  You have pain or cramps in your legs and feet.  Your legs or feet are numb.  Your feet always feel cold. SEEK IMMEDIATE MEDICAL CARE IF:   There is increasing redness, swelling, or pain in or around a wound.  There is a red line that goes up your leg.  Pus is coming from a wound.  You develop a fever or as directed by your health care provider.  You notice a bad smell coming from an ulcer or wound. Document Released: 12/05/2000 Document Revised: 08/10/2013 Document Reviewed: 05/17/2013 [facility] Patient Information 2014 [place], [place].

## 2014-03-10 ENCOUNTER — Encounter: Payer: Self-pay | Admitting: Internal Medicine

## 2014-03-10 ENCOUNTER — Encounter: Payer: Self-pay | Admitting: Anesthesiology

## 2014-03-11 ENCOUNTER — Telehealth: Payer: Self-pay | Admitting: Physician Assistant

## 2014-03-11 NOTE — Telephone Encounter (Signed)
Mr. Lenzini called to report 5 lb weight gain in 1 day. He was recently started on Lantus and Novolog by PCP for CBGs in the 800s. Down to 294 today which is an improvement but was warned by PCP that he may see slight fluctuation of weight in the beginning related to insulin. He is feeling great otherwise. He reports compliance with meds and diet. He plans to take a metolazone today which I agree with. He normally takes KCl PO BID and plans to take an additional today when taking the metolazone. His last BMET availale in EPIC on 3/8 shows K of 2.9 but Karie Mainland Cosgrove's note on 3/17 indicates there was an outpatient lab draw with K of 3.8. Thus I agree with his current plan for potassium. I have asked [place] to contact us tomorrow if weight does not begin trending down or if he has other sx. CHF instructions reviewed including salt/fluid restriction, elevation of legs, notification of worsening sx. He verbalized understanding and gratitude. Ayat Drenning PA-C

## 2014-03-13 ENCOUNTER — Other Ambulatory Visit (HOSPITAL_COMMUNITY): Payer: Self-pay

## 2014-03-13 ENCOUNTER — Telehealth (HOSPITAL_COMMUNITY): Payer: Self-pay | Admitting: Cardiology

## 2014-03-13 MED ORDER — POTASSIUM CHLORIDE CRYS ER 20 MEQ PO TBCR: 60.0000 meq | EXTENDED_RELEASE_TABLET | Freq: Two times a day (BID) | ORAL | Status: DC

## 2014-03-13 NOTE — Telephone Encounter (Signed)
RN called after pts home visit Hard time getting PICC line labs, venipuncture done instead Up 6 lbs in one week Seen PCP started on SS insulin and was told this could increase his weight  Please advise if necessary

## 2014-03-15 ENCOUNTER — Ambulatory Visit: Payer: Medicare Other | Admitting: Endocrinology

## 2014-03-15 NOTE — Telephone Encounter (Signed)
Spoke w/pt today he states wt is down to 338, and he is doing much better since the weekend when he had spoken with Annabelle Harman, he has f/u with [place] Monday

## 2014-03-17 ENCOUNTER — Other Ambulatory Visit (HOSPITAL_COMMUNITY): Payer: Self-pay | Admitting: Cardiology

## 2014-03-17 DIAGNOSIS — I5022 Chronic systolic (congestive) heart failure: Secondary | ICD-10-CM

## 2014-03-18 DIAGNOSIS — I5023 Acute on chronic systolic (congestive) heart failure: Secondary | ICD-10-CM

## 2014-03-18 DIAGNOSIS — I509 Heart failure, unspecified: Secondary | ICD-10-CM

## 2014-03-18 DIAGNOSIS — IMO0001 Reserved for inherently not codable concepts without codable children: Secondary | ICD-10-CM

## 2014-03-18 DIAGNOSIS — E1165 Type 2 diabetes mellitus with hyperglycemia: Secondary | ICD-10-CM

## 2014-03-18 DIAGNOSIS — Z452 Encounter for adjustment and management of vascular access device: Secondary | ICD-10-CM

## 2014-03-20 ENCOUNTER — Ambulatory Visit (HOSPITAL_COMMUNITY)
Admission: RE | Admit: 2014-03-20 | Discharge: 2014-03-20 | Disposition: A | Discharge status: Home / Self Care | Payer: Medicare Other | Source: Ambulatory Visit | Attending: Internal Medicine | Admitting: Internal Medicine

## 2014-03-20 ENCOUNTER — Ambulatory Visit (HOSPITAL_COMMUNITY): Payer: Medicare Other

## 2014-03-20 VITALS — BP 94/56 | HR 120 | Wt 339.5 lb

## 2014-03-20 DIAGNOSIS — N183 Chronic kidney disease, stage 3 unspecified: Secondary | ICD-10-CM | POA: Insufficient documentation

## 2014-03-20 DIAGNOSIS — G4730 Sleep apnea, unspecified: Secondary | ICD-10-CM

## 2014-03-20 DIAGNOSIS — Z6841 Body Mass Index (BMI) 40.0 and over, adult: Secondary | ICD-10-CM | POA: Insufficient documentation

## 2014-03-20 DIAGNOSIS — I5022 Chronic systolic (congestive) heart failure: Secondary | ICD-10-CM | POA: Insufficient documentation

## 2014-03-20 MED ORDER — MILRINONE IN DEXTROSE 20 MG/100ML IV SOLN: 0.2500 ug/kg/min | INTRAVENOUS | Status: DC

## 2014-03-20 MED ORDER — SPIRONOLACTONE 25 MG PO TABS: 12.5000 mg | ORAL_TABLET | Freq: Every day | ORAL | Status: DC

## 2014-03-20 NOTE — Patient Instructions (Signed)
[facility] will decrease your Milrinone   Start Spironolacone 25 mg 1/2 tab daily   Labs today  Your physician recommends that you schedule a follow-up appointment in: 3-4 weeks  Cardiac MRI or echocardiogram will be scheduled

## 2014-03-20 NOTE — Progress Notes (Signed)
Patient ID: Steven Wyatt, male   DOB: 03-22-1976, 38 y.o.   MRN: [id number]  Weight Range   Baseline proBNP     HPI: Steven Wyatt is a 38 yo male with a history of NICM (nor cors Oct 2014), chronic systolic HF, morbid obesity, OSA, HTN, CKD and HLD.   Admitted 1/13-1/18/15 for A/C HF. He was placed on IV lasix gtt and milrinone, however signed out AMA. During his admission his weight decreased from 405 to 368 lbs. ECHO showed EF 20% with diff HK, grade II DD, mod pulm HTN and mod sev LAE. His Cr bumped to 1.56. CO-OX when he left AMA was 35% on 01/08/14.   Readmitted 01/11/14 after he had syncopal episode.  Diuresed with lasix drip and metolazone. He was transitioned to bumex 4 mg twice a day. He was not be placed on Ace/Spiro due to CKD. He is not currently LVAD/transplant candidate due to obesity, noncompliance, RV dysfunction, and CKD. Discharged on home Milrinone at 0.375 mcg with [facility] to follow. Discharge weight 357 pounds.   Follow-up: Overall, he is feeling well.  Continues on milrinone 0.375.  Followed by [facility] for home Milrinone, PICC and telemedicine. Weight stable at 339. Uses BiPap nightly. Watching salt and fluid intake. Now living in apartment. He has been walking daily, up to 8 blocks, without dyspnea.  He is able to walk around [place].  No lightheadedness, orthopnea, or PND.  He was admitted in early 3/15 with DKA and started on insulin.  He now is seeing a PCP and has his glucose under much better control.  He has not been using prn metolazone.  Labs 01/15/14 K 3.3 Creatinine 1.75  Labs 01/16/14 K 3.5 Creatinine 1.65  Labs 3/15 K 2.9 => 3.7, creatinine 1.34 => 1.36, HCT 33.5  ECG: sinus tachy at 114, LBBB with QRS 146 msec   FH: No CHF, no SCD that he knows of.  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  Moved from [place].   ROS: All systems negative except as listed in HPI, PMH and Problem List.  PMH: 1. Nonischemic cardiomyopathy:  Initial workup in [place].  LHC (10/14) with normal coronaries.  Echo (1/15) with EF 20%, mild diffuse hypokinesis, moderately dilated LV, moderately dilated RV with moderately decreased systolic function, PA systolic pressure 59 mmHg.  1/15 low output heart failure, milrinone begun.  2. Morbid obesity 3. OHS/OSA on Bipap.  4. HTN 5. Hyperlipidemia 6. CKD: Suspect cardiorenal syndrome 7. Syncope: Vasovagal in setting of vomiting 8. Type II diabetes: Borderline.   Current Outpatient Prescriptions  Medication Sig Dispense Refill  . albuterol (PROVENTIL HFA;VENTOLIN HFA) 108 (90 BASE) MCG/ACT inhaler Inhale 2 puffs into the lungs daily as needed for wheezing or shortness of breath.      Marland Kitchen aspirin EC 81 MG tablet Take 81 mg by mouth daily.      Marland Kitchen atorvastatin (LIPITOR) 80 MG tablet Take 80 mg by mouth daily.      . bumetanide (BUMEX) 2 MG tablet Take 2 tablets (4 mg total) by mouth 2 (two) times daily.  120 tablet  6  . carvedilol (COREG) 3.125 MG tablet Take 1 tablet (3.125 mg total) by mouth 2 (two) times daily.  60 tablet  3  . digoxin (LANOXIN) 0.125 MG tablet Take 1 tablet (0.125 mg total) by mouth daily.  30 tablet  6  . glucose monitoring kit (FREESTYLE) monitoring kit 1 each by  Does not apply route as needed for other.  1 each  0  . hydrALAZINE (APRESOLINE) 25 MG tablet Take 1 tablet (25 mg total) by mouth every 8 (eight) hours.  90 tablet  6  . insulin aspart (NOVOLOG FLEXPEN) 100 UNIT/ML FlexPen Inject 5 Units into the skin 3 (three) times daily with meals.  15 mL  11  . insulin glargine (LANTUS) 100 UNIT/ML injection Inject 0.25 mLs (25 Units total) into the skin at bedtime.  10 mL  11  . Insulin Pen Needle (BD PEN NEEDLE NANO U/F) 32G X 4 MM MISC Use to administer insulin three times a day Dx 250.00  100 each  5  . isosorbide mononitrate (IMDUR) 30 MG 24 hr tablet Take 1 tablet (30 mg total) by mouth daily.  30 tablet  6  . magnesium oxide (MAG-OX) 400 MG tablet Take 1 tablet  (400 mg total) by mouth 2 (two) times daily.  60 tablet  3  . metolazone (ZAROXOLYN) 5 MG tablet Take 1 tablet (5 mg total) by mouth as needed. Weight gain 3 pounds in 24 hours  8 tablet  6  . milrinone (PRIMACOR) 20 MG/100ML SOLN infusion Inject 41.825 mcg/min into the vein continuous.  100 mL  6  . potassium chloride SA (K-DUR,KLOR-CON) 20 MEQ tablet Take 3 tablets (60 mEq total) by mouth 2 (two) times daily.  90 tablet  3  . spironolactone (ALDACTONE) 25 MG tablet Take 0.5 tablets (12.5 mg total) by mouth daily.  15 tablet  3   No current facility-administered medications for this encounter.     PHYSICAL EXAM: Filed Vitals:   03/20/14 1213  BP: 94/56  Pulse: 120  Weight: 339 lb 8 oz (153.996 kg)  SpO2: 94%    General:  Well appearing. No resp difficulty HEENT: normal Neck: Thick. JVP not elevated, Carotids 2+ bilaterally; no bruits. No lymphadenopathy or thryomegaly appreciated. Cor: PMI nonpalpable. Regular rate & rhythm. No rubs, gallops or murmurs. Lungs: clear Abdomen: obese soft, nontender, nondistended.Good bowel sounds. Extremities: no cyanosis, clubbing, rash. LUE double lumen PICC. Chronic venous stasis changes but only trace ankle edema.  Neuro: alert & orientedx3, cranial nerves grossly intact. Moves all 4 extremities w/o difficulty. Affect pleasant.  ASSESSMENT & PLAN: 1. Chronic Systolic Heart Failure: Nonischemic cardiomyopathy.  12/2013 ECHO EF 20% with moderate RV dysfunction. Recent admission with low output heart failure, now on Milrionone at 0.375 mcg/kg/min via PICC. He feels much better since discharge on milrinone. Now NYHA II and near-euvolemic. - I am going to try cutting down a bit on milrinone, will decrease to 0.25 today.  Will gradually/slowly try to titrate off.  - Continue current Coreg, hydralazine/Imdur (soft BP).  - Continue digoxin.   - Will continue current Bumex dosing. - Add spironolactone 12.5 mg daily with BMET/BNP/digoxin level in 1 week.   - He is too large for cardiac MRI (sent him by to look today).  Therefore, I will get an echocardiogram. If EF remains < 35% will need evaluation for CRT-D (CRT may help him get off milrinone altogether). 2. OSA/OHS:  continue nightly BiPap.  3. CKD: Stable creatinine.  As above, will get BMET in 1 week.  Followup in 1 month.   Loralie Champagne MD 03/20/2014

## 2014-03-21 NOTE — Addendum Note (Signed)
Encounter addended by: Deitra Mayo, CCT on: 03/21/2014 10:06 AM<BR>     Documentation filed: Charges VN

## 2014-03-22 DIAGNOSIS — K611 Rectal abscess: Secondary | ICD-10-CM

## 2014-03-22 HISTORY — DX: Rectal abscess: K61.1

## 2014-03-23 ENCOUNTER — Emergency Department (INDEPENDENT_AMBULATORY_CARE_PROVIDER_SITE_OTHER)
Admission: EM | Admit: 2014-03-23 | Discharge: 2014-03-23 | Disposition: A | Discharge status: Home / Self Care | Payer: Medicare Other | Source: Home / Self Care | Attending: Emergency Medicine | Admitting: Emergency Medicine

## 2014-03-23 ENCOUNTER — Encounter (INDEPENDENT_AMBULATORY_CARE_PROVIDER_SITE_OTHER): Payer: Self-pay | Admitting: Surgery

## 2014-03-23 ENCOUNTER — Ambulatory Visit (INDEPENDENT_AMBULATORY_CARE_PROVIDER_SITE_OTHER): Payer: Medicare Other | Admitting: Surgery

## 2014-03-23 ENCOUNTER — Encounter (HOSPITAL_COMMUNITY): Payer: Self-pay | Admitting: Emergency Medicine

## 2014-03-23 ENCOUNTER — Ambulatory Visit: Payer: Medicare Other | Admitting: Endocrinology

## 2014-03-23 VITALS — BP 128/80 | HR 76 | Temp 97.8°F | Resp 16 | Ht 68.0 in | Wt 339.8 lb

## 2014-03-23 DIAGNOSIS — L0231 Cutaneous abscess of buttock: Secondary | ICD-10-CM

## 2014-03-23 DIAGNOSIS — L03317 Cellulitis of buttock: Secondary | ICD-10-CM

## 2014-03-23 DIAGNOSIS — K612 Anorectal abscess: Secondary | ICD-10-CM

## 2014-03-23 DIAGNOSIS — K611 Rectal abscess: Secondary | ICD-10-CM

## 2014-03-23 MED ORDER — ONDANSETRON 4 MG PO TBDP
ORAL_TABLET | ORAL | Status: AC
  Filled 2014-03-23: qty 1

## 2014-03-23 MED ORDER — ONDANSETRON 4 MG PO TBDP
4.0000 mg | ORAL_TABLET | Freq: Once | ORAL | Status: AC
  Administered 2014-03-23: 4 mg via ORAL

## 2014-03-23 MED ORDER — HYDROMORPHONE HCL PF 1 MG/ML IJ SOLN
INTRAMUSCULAR | Status: AC
  Filled 2014-03-23: qty 2

## 2014-03-23 MED ORDER — HYDROMORPHONE HCL 1 MG/ML IJ SOLN
2.0000 mg | Freq: Once | INTRAMUSCULAR | Status: AC
  Administered 2014-03-23: 2 mg via INTRAMUSCULAR

## 2014-03-23 MED ORDER — AMOXICILLIN-POT CLAVULANATE 875-125 MG PO TABS: 1.0000 | ORAL_TABLET | Freq: Two times a day (BID) | ORAL | Status: DC

## 2014-03-23 MED ORDER — HYDROCODONE-ACETAMINOPHEN 5-325 MG PO TABS: 1.0000 | ORAL_TABLET | Freq: Four times a day (QID) | ORAL | Status: DC | PRN

## 2014-03-23 NOTE — ED Notes (Signed)
Pt  Has  Swollen tender   Area  To  r      Inner  Rectal  Area               With  Symptoms  X  3  Days                      Pt  Has   Had  A  Rectal  abcess         In  Past          Pt  Reports      Has  Had  Similar  Episode  In  Past

## 2014-03-23 NOTE — ED Provider Notes (Signed)
CSN: [id number]     Arrival date & time 03/23/14  7858 History   First MD Initiated Contact with Patient 03/23/14 1030     Chief Complaint  Patient presents with  . Rectal Problems   (Consider location/radiation/quality/duration/timing/severity/associated sxs/prior Treatment) Patient is a 38 y.o. male presenting with abscess. The history is provided by the patient. No language interpreter was used.  Abscess Location:  Ano-genital Ano-genital abscess location:  L buttock and R buttock Abscess quality: painful and redness   Red streaking: no   Duration:  2 days Progression:  Worsening Pain details:    Quality:  Aching   Timing:  Constant   Progression:  Worsening Relieved by:  Nothing Worsened by:  Nothing tried Pt complains of pain with bowel movement,   Pain in buttocks.  Pt reports previous abscess and this feels the same  Past Medical History  Diagnosis Date  . CHF (congestive heart failure)   . Diabetes   . HTN (hypertension)   . Morbid obesity with BMI of 50.0-59.9, adult   . Sleep apnea     on Bi Pap  . Dyslipidemia   . CKD (chronic kidney disease), stage III   . Acute on chronic systolic CHF (congestive heart failure) 01/03/2014  . Asthma 01/03/2014  . Chronic systolic heart failure 07/26/276  . DKA (diabetic ketoacidoses) 02/25/2014  . Hypokalemia 01/04/2014  . Hyponatremia 02/25/2014  . Microcytic anemia 01/04/2014  . 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 Normal coronary arteries- last cath Oct 2014 at [facility] 01/03/2014  . History of chicken pox    History reviewed. No pertinent past surgical history. Family History  Problem Relation Age of Onset  . Hypertension Mother   . Diabetes Maternal Grandmother    History  Substance Use Topics  . Smoking status: Never Smoker   . Smokeless tobacco: Not on file  . Alcohol Use: No    Review of Systems  All other systems reviewed and are negative.    Allergies  Iodine and Shellfish allergy  Home  Medications   Current Outpatient Rx  Name  Route  Sig  Dispense  Refill  . albuterol (PROVENTIL HFA;VENTOLIN HFA) 108 (90 BASE) MCG/ACT inhaler   Inhalation   Inhale 2 puffs into the lungs daily as needed for wheezing or shortness of breath.         Marland Kitchen aspirin EC 81 MG tablet   Oral   Take 81 mg by mouth daily.         Marland Kitchen atorvastatin (LIPITOR) 80 MG tablet   Oral   Take 80 mg by mouth daily.         . bumetanide (BUMEX) 2 MG tablet   Oral   Take 2 tablets (4 mg total) by mouth 2 (two) times daily.   120 tablet   6   . carvedilol (COREG) 3.125 MG tablet   Oral   Take 1 tablet (3.125 mg total) by mouth 2 (two) times daily.   60 tablet   3   . digoxin (LANOXIN) 0.125 MG tablet   Oral   Take 1 tablet (0.125 mg total) by mouth daily.   30 tablet   6   . glucose monitoring kit (FREESTYLE) monitoring kit   Does not apply   1 each by Does not apply route as needed for other.   1 each   0   . hydrALAZINE (APRESOLINE) 25 MG tablet   Oral   Take 1 tablet (25 mg total) by  mouth every 8 (eight) hours.   90 tablet   6   . insulin aspart (NOVOLOG FLEXPEN) 100 UNIT/ML FlexPen   Subcutaneous   Inject 5 Units into the skin 3 (three) times daily with meals.   15 mL   11   . insulin glargine (LANTUS) 100 UNIT/ML injection   Subcutaneous   Inject 0.25 mLs (25 Units total) into the skin at bedtime.   10 mL   11   . Insulin Pen Needle (BD PEN NEEDLE NANO U/F) 32G X 4 MM MISC      Use to administer insulin three times a day Dx 250.00   100 each   5   . isosorbide mononitrate (IMDUR) 30 MG 24 hr tablet   Oral   Take 1 tablet (30 mg total) by mouth daily.   30 tablet   6   . magnesium oxide (MAG-OX) 400 MG tablet   Oral   Take 1 tablet (400 mg total) by mouth 2 (two) times daily.   60 tablet   3     Please cancel all previous orders for current medi ...   . metolazone (ZAROXOLYN) 5 MG tablet   Oral   Take 1 tablet (5 mg total) by mouth as needed. Weight  gain 3 pounds in 24 hours   8 tablet   6   . milrinone (PRIMACOR) 20 MG/100ML SOLN infusion   Intravenous   Inject 41.825 mcg/min into the vein continuous.   100 mL   6   . potassium chloride SA (K-DUR,KLOR-CON) 20 MEQ tablet   Oral   Take 3 tablets (60 mEq total) by mouth 2 (two) times daily.   90 tablet   3     Please cancel all previous orders for current medi ...   . spironolactone (ALDACTONE) 25 MG tablet   Oral   Take 0.5 tablets (12.5 mg total) by mouth daily.   15 tablet   3    BP 104/62  Pulse 88  Temp(Src) 98.3 F (36.8 C) (Oral)  Resp 20  SpO2 97% Physical Exam  Nursing note and vitals reviewed. Constitutional: He is oriented to person, place, and time. He appears well-developed and well-nourished.  HENT:  Head: Normocephalic.  Eyes: EOM are normal.  Neck: Normal range of motion.  Pulmonary/Chest: Effort normal.  Abdominal: He exhibits no distension.  Musculoskeletal:  Tender areas lower buttock up crease of buttocks and toward rectum.     Neurological: He is alert and oriented to person, place, and time.  Psychiatric: He has a normal mood and affect.    ED Course  Procedures (including critical care time) Labs Review Labs Reviewed - No data to display Imaging Review No results found.   MDM   1. Abscess of buttock   (I attempted ultrasound  Poor view,  )   I called [facility] [place] [facility] and they can see pt today  Fransico Meadow, PA-C 03/23/14 1237

## 2014-03-23 NOTE — ED Provider Notes (Signed)
Medical screening examination/treatment/procedure(s) were performed by non-physician practitioner and as supervising physician I was immediately available for consultation/collaboration.  Leslee Home, M.D.  Reuben Likes, MD 03/23/14 [phone]

## 2014-03-23 NOTE — Discharge Instructions (Signed)
Go to [facility] at 3:45 to be seen

## 2014-03-23 NOTE — Progress Notes (Signed)
Subjective:     Patient ID: Steven Wyatt., male   DOB: 04-18-1976, 38 y.o.   MRN: [id number]  HPI This is a gentleman with multiple chronic comorbidities who is sent over from the emergency department for a perirectal abscess.  Review of Systems     Objective:   Physical Exam On exam, he is tender at the gluteal cleft near the anus. I prepped the area with ChloraPrep, anesthetized with lidocaine, and entered a deep abscess cavity. It was difficult to get to it first with an 18-gauge needle. I am uncertain whether was adequately drained I packed with gauze    Assessment:     Perirectal abscess     Plan:     He will remove the packing tomorrow. I will place him on Augmentin. If this persists, he will need to go to the emergency department. He has multiple comorbidities and if he needs operative intervention, would need to be admitted by medical physicians and cleared by cardiology. I would not reattempt trying to drain this in the office

## 2014-03-24 ENCOUNTER — Telehealth (INDEPENDENT_AMBULATORY_CARE_PROVIDER_SITE_OTHER): Payer: Self-pay | Admitting: General Surgery

## 2014-03-24 ENCOUNTER — Other Ambulatory Visit (HOSPITAL_COMMUNITY): Payer: Self-pay | Admitting: Internal Medicine

## 2014-03-24 LAB — MAGNESIUM: Magnesium: 1.5 mg/dL (ref 1.5–2.5)

## 2014-03-24 LAB — CBC WITH DIFFERENTIAL/PLATELET
BASOS PCT: 0 % (ref 0–1)
Basophils Absolute: 0 10*3/uL (ref 0.0–0.1)
EOS ABS: 0 10*3/uL (ref 0.0–0.7)
Eosinophils Relative: 0 % (ref 0–5)
HEMATOCRIT: 34.5 % — AB (ref 39.0–52.0)
Hemoglobin: 11.4 g/dL — ABNORMAL LOW (ref 13.0–17.0)
Lymphocytes Relative: 7 % — ABNORMAL LOW (ref 12–46)
Lymphs Abs: 1 10*3/uL (ref 0.7–4.0)
MCH: 25 pg — ABNORMAL LOW (ref 26.0–34.0)
MCHC: 33 g/dL (ref 30.0–36.0)
MCV: 75.7 fL — ABNORMAL LOW (ref 78.0–100.0)
MONO ABS: 1 10*3/uL (ref 0.1–1.0)
Monocytes Relative: 7 % (ref 3–12)
Neutro Abs: 12.4 10*3/uL — ABNORMAL HIGH (ref 1.7–7.7)
Neutrophils Relative %: 86 % — ABNORMAL HIGH (ref 43–77)
Platelets: 228 10*3/uL (ref 150–400)
RBC: 4.56 MIL/uL (ref 4.22–5.81)
RDW: 16.8 % — ABNORMAL HIGH (ref 11.5–15.5)
WBC: 14.4 10*3/uL — ABNORMAL HIGH (ref 4.0–10.5)

## 2014-03-24 LAB — BASIC METABOLIC PANEL
BUN: 29 mg/dL — ABNORMAL HIGH (ref 6–23)
CALCIUM: 8.1 mg/dL — AB (ref 8.4–10.5)
CO2: 35 mEq/L — ABNORMAL HIGH (ref 19–32)
Chloride: 75 mEq/L — ABNORMAL LOW (ref 96–112)
Creat: 2 mg/dL — ABNORMAL HIGH (ref 0.50–1.35)
GLUCOSE: 403 mg/dL — AB (ref 70–99)
POTASSIUM: 2.9 meq/L — AB (ref 3.5–5.3)
Sodium: 125 mEq/L — ABNORMAL LOW (ref 135–145)

## 2014-03-24 NOTE — Telephone Encounter (Signed)
Patient's home health rn called stating patient has a fever to 101.7 and a blood glucose of 495. I ask her to recommend that the patient be evaluated in the emergency department given these findings. He is status post incision and drainage of a perirectal abscess by Dr Magnus Ivan in the office yesterday.  He is currently taking po abx.

## 2014-03-27 ENCOUNTER — Telehealth (HOSPITAL_COMMUNITY): Payer: Self-pay | Admitting: Anesthesiology

## 2014-03-27 MED ORDER — MILRINONE IN DEXTROSE 20 MG/100ML IV SOLN: 0.3750 ug/kg/min | INTRAVENOUS | Status: DC

## 2014-03-27 NOTE — Telephone Encounter (Signed)
Reviewed patient's labs:  Na 125 K+ 2.9 Creatinine 2.0> 1.34  At last OV started spiro 12.5 mg daily and decreased milrinone to 0.25 mg. Will have him take an additional 40 meq of KCL tonight with his scheduled 60 meq and will send note to [facility] to increase his milrinone back to 0.375. Patient aware.  Ulla Potash B NP-C 5:28 PM

## 2014-03-28 ENCOUNTER — Ambulatory Visit (HOSPITAL_COMMUNITY): Payer: Medicare Other

## 2014-03-28 ENCOUNTER — Telehealth (HOSPITAL_COMMUNITY): Payer: Self-pay | Admitting: Anesthesiology

## 2014-03-28 ENCOUNTER — Encounter (HOSPITAL_COMMUNITY): Payer: Self-pay | Admitting: Anesthesiology

## 2014-03-28 MED ORDER — MILRINONE IN DEXTROSE 20 MG/100ML IV SOLN: 0.2500 ug/kg/min | INTRAVENOUS | Status: DC

## 2014-03-28 NOTE — Progress Notes (Signed)
This encounter was created in error - please disregard.

## 2014-03-28 NOTE — Telephone Encounter (Signed)
Reviewed labs from 03/27/14:  Na 128 K+ 3.0 Cr 1.33  Yesterday patient was instructed to take an additional 40 meq of KCL on top of his nightly 60 meq daily. We had increased milrinone back to 0.375 mg, however believe previous labs were incorrect so will decrease back to 0.25 mg  Ulla Potash B NP-C 4:47 PM

## 2014-03-29 ENCOUNTER — Inpatient Hospital Stay (HOSPITAL_COMMUNITY)
Admission: EM | Admit: 2014-03-29 | Discharge: 2014-04-01 | DRG: 348 | Disposition: A | Discharge status: Home / Self Care | Payer: Medicare Other | Attending: Internal Medicine | Admitting: Internal Medicine

## 2014-03-29 ENCOUNTER — Encounter (HOSPITAL_COMMUNITY): Payer: Self-pay | Admitting: Emergency Medicine

## 2014-03-29 DIAGNOSIS — E119 Type 2 diabetes mellitus without complications: Secondary | ICD-10-CM | POA: Diagnosis present

## 2014-03-29 DIAGNOSIS — E785 Hyperlipidemia, unspecified: Secondary | ICD-10-CM | POA: Diagnosis present

## 2014-03-29 DIAGNOSIS — E860 Dehydration: Secondary | ICD-10-CM

## 2014-03-29 DIAGNOSIS — I10 Essential (primary) hypertension: Secondary | ICD-10-CM | POA: Diagnosis present

## 2014-03-29 DIAGNOSIS — Z79899 Other long term (current) drug therapy: Secondary | ICD-10-CM

## 2014-03-29 DIAGNOSIS — D72829 Elevated white blood cell count, unspecified: Secondary | ICD-10-CM | POA: Diagnosis present

## 2014-03-29 DIAGNOSIS — I428 Other cardiomyopathies: Secondary | ICD-10-CM | POA: Diagnosis present

## 2014-03-29 DIAGNOSIS — Z794 Long term (current) use of insulin: Secondary | ICD-10-CM

## 2014-03-29 DIAGNOSIS — Z01810 Encounter for preprocedural cardiovascular examination: Secondary | ICD-10-CM

## 2014-03-29 DIAGNOSIS — K611 Rectal abscess: Secondary | ICD-10-CM

## 2014-03-29 DIAGNOSIS — G4733 Obstructive sleep apnea (adult) (pediatric): Secondary | ICD-10-CM | POA: Diagnosis present

## 2014-03-29 DIAGNOSIS — N183 Chronic kidney disease, stage 3 unspecified: Secondary | ICD-10-CM | POA: Diagnosis present

## 2014-03-29 DIAGNOSIS — I129 Hypertensive chronic kidney disease with stage 1 through stage 4 chronic kidney disease, or unspecified chronic kidney disease: Secondary | ICD-10-CM | POA: Diagnosis present

## 2014-03-29 DIAGNOSIS — E876 Hypokalemia: Secondary | ICD-10-CM

## 2014-03-29 DIAGNOSIS — G4730 Sleep apnea, unspecified: Secondary | ICD-10-CM | POA: Diagnosis present

## 2014-03-29 DIAGNOSIS — I5022 Chronic systolic (congestive) heart failure: Secondary | ICD-10-CM | POA: Diagnosis present

## 2014-03-29 DIAGNOSIS — E1165 Type 2 diabetes mellitus with hyperglycemia: Secondary | ICD-10-CM

## 2014-03-29 DIAGNOSIS — I5023 Acute on chronic systolic (congestive) heart failure: Secondary | ICD-10-CM

## 2014-03-29 DIAGNOSIS — J45909 Unspecified asthma, uncomplicated: Secondary | ICD-10-CM | POA: Diagnosis present

## 2014-03-29 DIAGNOSIS — K612 Anorectal abscess: Principal | ICD-10-CM | POA: Diagnosis present

## 2014-03-29 DIAGNOSIS — Z6841 Body Mass Index (BMI) 40.0 and over, adult: Secondary | ICD-10-CM

## 2014-03-29 DIAGNOSIS — I509 Heart failure, unspecified: Secondary | ICD-10-CM | POA: Diagnosis present

## 2014-03-29 DIAGNOSIS — IMO0001 Reserved for inherently not codable concepts without codable children: Secondary | ICD-10-CM | POA: Diagnosis present

## 2014-03-29 DIAGNOSIS — N179 Acute kidney failure, unspecified: Secondary | ICD-10-CM | POA: Diagnosis present

## 2014-03-29 HISTORY — DX: Rectal abscess: K61.1

## 2014-03-29 LAB — CBC WITH DIFFERENTIAL/PLATELET
BASOS PCT: 0 % (ref 0–1)
Basophils Absolute: 0 10*3/uL (ref 0.0–0.1)
Eosinophils Absolute: 0.2 10*3/uL (ref 0.0–0.7)
Eosinophils Relative: 1 % (ref 0–5)
HEMATOCRIT: 38.8 % — AB (ref 39.0–52.0)
HEMOGLOBIN: 13.4 g/dL (ref 13.0–17.0)
LYMPHS ABS: 1.4 10*3/uL (ref 0.7–4.0)
LYMPHS PCT: 9 % — AB (ref 12–46)
MCH: 25.9 pg — AB (ref 26.0–34.0)
MCHC: 34.5 g/dL (ref 30.0–36.0)
MCV: 75 fL — ABNORMAL LOW (ref 78.0–100.0)
MONO ABS: 1.4 10*3/uL — AB (ref 0.1–1.0)
Monocytes Relative: 9 % (ref 3–12)
NEUTROS ABS: 12.1 10*3/uL — AB (ref 1.7–7.7)
Neutrophils Relative %: 81 % — ABNORMAL HIGH (ref 43–77)
Platelets: 337 10*3/uL (ref 150–400)
RBC: 5.17 MIL/uL (ref 4.22–5.81)
RDW: 17.1 % — AB (ref 11.5–15.5)
WBC: 15.1 10*3/uL — ABNORMAL HIGH (ref 4.0–10.5)

## 2014-03-29 LAB — BASIC METABOLIC PANEL
BUN: 33 mg/dL — AB (ref 6–23)
CALCIUM: 9.1 mg/dL (ref 8.4–10.5)
CHLORIDE: 76 meq/L — AB (ref 96–112)
CO2: 36 mEq/L — ABNORMAL HIGH (ref 19–32)
Creatinine, Ser: 1.2 mg/dL (ref 0.50–1.35)
GFR calc Af Amer: 89 mL/min — ABNORMAL LOW (ref >90)
GFR calc non Af Amer: 77 mL/min — ABNORMAL LOW (ref >90)
GLUCOSE: 182 mg/dL — AB (ref 70–99)
Potassium: 2.5 mEq/L — CL (ref 3.7–5.3)
Sodium: 130 mEq/L — ABNORMAL LOW (ref 137–147)

## 2014-03-29 MED ORDER — SODIUM CHLORIDE 0.9 % IV BOLUS (SEPSIS)
1000.0000 mL | Freq: Once | INTRAVENOUS | Status: AC
  Administered 2014-03-29: 1000 mL via INTRAVENOUS

## 2014-03-29 MED ORDER — CLINDAMYCIN PHOSPHATE 600 MG/50ML IV SOLN
600.0000 mg | Freq: Once | INTRAVENOUS | Status: AC
  Administered 2014-03-29: 600 mg via INTRAVENOUS
  Filled 2014-03-29: qty 50

## 2014-03-29 MED ORDER — HYDROMORPHONE HCL PF 1 MG/ML IJ SOLN
1.0000 mg | Freq: Once | INTRAMUSCULAR | Status: AC
  Administered 2014-03-29: 1 mg via INTRAVENOUS
  Filled 2014-03-29: qty 1

## 2014-03-29 MED ORDER — POTASSIUM CHLORIDE 10 MEQ/100ML IV SOLN
10.0000 meq | INTRAVENOUS | Status: AC
  Administered 2014-03-29: 10 meq via INTRAVENOUS
  Filled 2014-03-29: qty 100

## 2014-03-29 NOTE — ED Provider Notes (Signed)
Patient seen/examined in the Emergency Department in conjunction with Resident Physician Provider [facility] Patient reports worsening abscess of buttock Exam : large buttock abscess with copious drainage Plan: will need surgical consultation as he recently had it drained by general surgery and will need further surgical therapy.   Joya Gaskins, MD 03/29/14 [phone]

## 2014-03-29 NOTE — ED Notes (Addendum)
Pt underwent I&D of abscess to left buttock in physicians office on Thursday of last week - today pt w/ increased pain and swelling to left buttock and around rectal area - pt admits to "feeling warm" however denies any known fever - pt has been unable to have a BM d/t pain, LNBM was last Thursday.

## 2014-03-29 NOTE — ED Notes (Signed)
CRITICAL VALUE ALERT  Critical value received:  Potassium 2.5  Date of notification:  03/29/2014  Time of notification:  2245  Critical value read back:yes  Nurse who received alert:  Cassie Freer, RN  MD notified : D. Marcha Solders, MD

## 2014-03-29 NOTE — ED Notes (Signed)
Pt reports that his PICC line has already been flushed today and is ready to use. Medication administration through the "red" line.

## 2014-03-29 NOTE — ED Provider Notes (Signed)
CSN: [id number]     Arrival date & time 03/29/14  1944 History   First MD Initiated Contact with Patient 03/29/14 2000     Chief Complaint  Patient presents with  . Abscess     (Consider location/radiation/quality/duration/timing/severity/associated sxs/prior Treatment) Patient is a 38 y.o. male presenting with abscess. The history is provided by the patient and medical records.  Abscess Location:  Ano-genital Ano-genital abscess location:  L buttock and gluteal cleft Size:  10 cm Abscess quality: draining, fluctuance, induration, painful and warmth   Abscess quality: no redness   Red streaking: yes   Duration:  1 week Progression:  Worsening Pain details:    Quality:  Pressure   Severity:  Severe   Duration:  1 week   Timing:  Constant   Progression:  Worsening Chronicity:  New Context: diabetes   Context: not immunosuppression and not injected drug use   Relieved by:  Nothing Worsened by:  Draining/squeezing Ineffective treatments: I&D'd at Dr. Doroteo Glassman office in 04/02. Associated symptoms: no fever, no nausea and no vomiting   Risk factors: no prior abscess     Past Medical History  Diagnosis Date  . CHF (congestive heart failure)   . Diabetes   . HTN (hypertension)   . Morbid obesity with BMI of 50.0-59.9, adult   . Sleep apnea     on Bi Pap  . Dyslipidemia   . CKD (chronic kidney disease), stage III   . Acute on chronic systolic CHF (congestive heart failure) 01/03/2014  . Asthma 01/03/2014  . Chronic systolic heart failure 02/20/2014  . DKA (diabetic ketoacidoses) 02/25/2014  . Hypokalemia 01/04/2014  . Hyponatremia 02/25/2014  . Microcytic anemia 01/04/2014  . 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 Normal coronary arteries- last cath Oct 2014 at [facility] 01/03/2014  . History of chicken pox    History reviewed. No pertinent past surgical history. Family History  Problem Relation Age of Onset  . Hypertension Mother   . Diabetes Maternal Grandmother     History  Substance Use Topics  . Smoking status: Never Smoker   . Smokeless tobacco: Not on file  . Alcohol Use: No    Review of Systems  Constitutional: Negative for fever, activity change and appetite change.  HENT: Negative for congestion and rhinorrhea.   Eyes: Negative for discharge and itching.  Respiratory: Negative for cough, shortness of breath and wheezing.   Cardiovascular: Negative for chest pain.  Gastrointestinal: Negative for nausea, vomiting, abdominal pain, diarrhea and constipation.  Genitourinary: Negative for hematuria, decreased urine volume and difficulty urinating.  Skin: Positive for wound. Negative for rash.  Neurological: Negative for syncope, weakness and numbness.  All other systems reviewed and are negative.     Allergies  Iodine and Shellfish allergy  Home Medications   No current outpatient prescriptions on file. BP 91/57  Pulse 72  Temp(Src) 100.2 F (37.9 C) (Oral)  Resp 18  Ht 5\' 5"  (1.651 m)  Wt 338 lb (153.316 kg)  BMI 56.25 kg/m2  SpO2 95% Physical Exam  Vitals reviewed. Constitutional: He is oriented to person, place, and time. He appears well-developed and well-nourished. No distress.  Appears very uncomfortable, NAD  HENT:  Head: Normocephalic and atraumatic.  Mouth/Throat: Oropharynx is clear and moist. No oropharyngeal exudate.  Eyes: Conjunctivae and EOM are normal. Pupils are equal, round, and reactive to light. Right eye exhibits no discharge. Left eye exhibits no discharge. No scleral icterus.  Neck: Normal range of motion. Neck supple.  Cardiovascular:  Regular rhythm, normal heart sounds and intact distal pulses.  Exam reveals no gallop and no friction rub.   No murmur heard. tachy  Pulmonary/Chest: Effort normal and breath sounds normal. No respiratory distress. He has no wheezes. He has no rales.  Abdominal: Soft. He exhibits no distension and no mass. There is no tenderness.  Genitourinary:  3 cm superior to  anus has 3 cm old incision, draining large amount of tan pus. From old incison about 7 cm superolatearl there is large amount of induration, ttp, warmth, redness leading to a large 4 cm area of fluctuance in the L buttock  Musculoskeletal: Normal range of motion.  Neurological: He is alert and oriented to person, place, and time. No cranial nerve deficit. He exhibits normal muscle tone. Coordination normal.  Skin: Skin is warm. No rash noted. He is not diaphoretic.    ED Course  Procedures (including critical care time) Labs Review Labs Reviewed  CBC WITH DIFFERENTIAL - Abnormal; Notable for the following:    WBC 15.1 (*)    HCT 38.8 (*)    MCV 75.0 (*)    MCH 25.9 (*)    RDW 17.1 (*)    Neutrophils Relative % 81 (*)    Lymphocytes Relative 9 (*)    Neutro Abs 12.1 (*)    Monocytes Absolute 1.4 (*)    All other components within normal limits  BASIC METABOLIC PANEL - Abnormal; Notable for the following:    Sodium 130 (*)    Potassium 2.5 (*)    Chloride 76 (*)    CO2 36 (*)    Glucose, Bld 182 (*)    BUN 33 (*)    GFR calc non Af Amer 77 (*)    GFR calc Af Amer 89 (*)    All other components within normal limits  GLUCOSE, CAPILLARY - Abnormal; Notable for the following:    Glucose-Capillary 104 (*)    All other components within normal limits  BASIC METABOLIC PANEL  CBC  DIGOXIN LEVEL   Imaging Review No results found.   EKG Interpretation None      MDM   MDM: 38 y.o. AAM w/ PMHx of CHF, DM, HTN, Morbid Obesity, CKD w/ cc: of abscess. 1-1.5 week of gluteal abscess. Was drained by Dr. Rayburn MaBlackmon in clinic on 04/02. Pt states initially got better and now worse. Large amoutn of pain. Denies f/c, abd pain, n/v/d but states he has problems with stooling d/t pain. Here AFVSS, slightly tachy. Normotensive. Exam as above with large draining abscess. Rectal exam with no obvious perirectal involvemment, but abscess very extensive. Labs ordered, Clinda given, Leukocytosis present.  AFter reading Dr. Doroteo GlassmanBlackmon's note and seeing extent of abscess inc omplicateed patient, felt best not to open up in ED but would likely need operative intervention. D/w Dr. Donell BeersByerly who agrees with plan to admit to medicine for IV antibiotics and medical optimization before potential ssurgical intervention. Labs also show hypokalemia and dehydration, pt given fluids and K. D/w medicine who will admit patient. Care of case d/w my attenidng. Pt given dilaudid while in ED and remained HDS.  Final diagnoses:  Hypokalemia  Dehydration  Perirectal abscess    Admit   Pilar Jarvisoug Lillymae Duet, MD 03/30/14 [phone]

## 2014-03-30 ENCOUNTER — Encounter (HOSPITAL_COMMUNITY): Payer: Self-pay | Admitting: Internal Medicine

## 2014-03-30 ENCOUNTER — Other Ambulatory Visit: Payer: Self-pay

## 2014-03-30 DIAGNOSIS — I428 Other cardiomyopathies: Secondary | ICD-10-CM

## 2014-03-30 DIAGNOSIS — E876 Hypokalemia: Secondary | ICD-10-CM

## 2014-03-30 DIAGNOSIS — Z01810 Encounter for preprocedural cardiovascular examination: Secondary | ICD-10-CM | POA: Insufficient documentation

## 2014-03-30 DIAGNOSIS — K612 Anorectal abscess: Principal | ICD-10-CM

## 2014-03-30 DIAGNOSIS — E119 Type 2 diabetes mellitus without complications: Secondary | ICD-10-CM

## 2014-03-30 DIAGNOSIS — I517 Cardiomegaly: Secondary | ICD-10-CM

## 2014-03-30 LAB — BASIC METABOLIC PANEL
BUN: 31 mg/dL — AB (ref 6–23)
BUN: 33 mg/dL — ABNORMAL HIGH (ref 6–23)
CALCIUM: 8.7 mg/dL (ref 8.4–10.5)
CHLORIDE: 81 meq/L — AB (ref 96–112)
CO2: 36 mEq/L — ABNORMAL HIGH (ref 19–32)
CO2: 33 mEq/L — ABNORMAL HIGH (ref 19–32)
CREATININE: 1.41 mg/dL — AB (ref 0.50–1.35)
Calcium: 8.8 mg/dL (ref 8.4–10.5)
Chloride: 83 mEq/L — ABNORMAL LOW (ref 96–112)
Creatinine, Ser: 1.22 mg/dL (ref 0.50–1.35)
GFR calc Af Amer: 87 mL/min — ABNORMAL LOW (ref >90)
GFR, EST AFRICAN AMERICAN: 73 mL/min — AB (ref >90)
GFR, EST NON AFRICAN AMERICAN: 75 mL/min — AB (ref >90)
GFR, EST NON AFRICAN AMERICAN: 63 mL/min — AB (ref >90)
GLUCOSE: 115 mg/dL — AB (ref 70–99)
Glucose, Bld: 283 mg/dL — ABNORMAL HIGH (ref 70–99)
POTASSIUM: 3 meq/L — AB (ref 3.7–5.3)
Potassium: 2.5 mEq/L — CL (ref 3.7–5.3)
Sodium: 136 mEq/L — ABNORMAL LOW (ref 137–147)
Sodium: 132 mEq/L — ABNORMAL LOW (ref 137–147)

## 2014-03-30 LAB — CBC
HCT: 35.3 % — ABNORMAL LOW (ref 39.0–52.0)
Hemoglobin: 11.6 g/dL — ABNORMAL LOW (ref 13.0–17.0)
MCH: 25 pg — ABNORMAL LOW (ref 26.0–34.0)
MCHC: 32.9 g/dL (ref 30.0–36.0)
MCV: 76.1 fL — ABNORMAL LOW (ref 78.0–100.0)
PLATELETS: 328 10*3/uL (ref 150–400)
RBC: 4.64 MIL/uL (ref 4.22–5.81)
RDW: 17.4 % — ABNORMAL HIGH (ref 11.5–15.5)
WBC: 13.4 10*3/uL — ABNORMAL HIGH (ref 4.0–10.5)

## 2014-03-30 LAB — MAGNESIUM
Magnesium: 1.5 mg/dL (ref 1.5–2.5)
Magnesium: 1.9 mg/dL (ref 1.5–2.5)

## 2014-03-30 LAB — GLUCOSE, CAPILLARY
Glucose-Capillary: 104 mg/dL — ABNORMAL HIGH (ref 70–99)
Glucose-Capillary: 147 mg/dL — ABNORMAL HIGH (ref 70–99)
Glucose-Capillary: 255 mg/dL — ABNORMAL HIGH (ref 70–99)
Glucose-Capillary: 270 mg/dL — ABNORMAL HIGH (ref 70–99)
Glucose-Capillary: 272 mg/dL — ABNORMAL HIGH (ref 70–99)

## 2014-03-30 LAB — CARBOXYHEMOGLOBIN
CARBOXYHEMOGLOBIN: 1 % (ref 0.5–1.5)
Methemoglobin: 0.7 % (ref 0.0–1.5)
O2 SAT: 44.9 %
Total hemoglobin: 13.7 g/dL (ref 13.5–18.0)

## 2014-03-30 LAB — DIGOXIN LEVEL: DIGOXIN LVL: 0.6 ng/mL — AB (ref 0.8–2.0)

## 2014-03-30 MED ORDER — HYDROMORPHONE HCL PF 1 MG/ML IJ SOLN
0.5000 mg | Freq: Once | INTRAMUSCULAR | Status: AC
Start: 2014-03-30 — End: 2014-03-30
  Administered 2014-03-30: 0.5 mg via INTRAVENOUS
  Filled 2014-03-30: qty 1

## 2014-03-30 MED ORDER — POTASSIUM CHLORIDE CRYS ER 20 MEQ PO TBCR
60.0000 meq | EXTENDED_RELEASE_TABLET | Freq: Once | ORAL | Status: AC
  Administered 2014-03-30: 60 meq via ORAL
  Filled 2014-03-30: qty 3

## 2014-03-30 MED ORDER — SPIRONOLACTONE 12.5 MG HALF TABLET
12.5000 mg | ORAL_TABLET | Freq: Every day | ORAL | Status: DC
  Filled 2014-03-30: qty 1

## 2014-03-30 MED ORDER — INSULIN ASPART 100 UNIT/ML ~~LOC~~ SOLN
0.0000 [IU] | Freq: Three times a day (TID) | SUBCUTANEOUS | Status: DC
  Administered 2014-03-30: 1 [IU] via SUBCUTANEOUS
  Administered 2014-03-30: 3 [IU] via SUBCUTANEOUS
  Administered 2014-03-30: 5 [IU] via SUBCUTANEOUS
  Administered 2014-03-31 (×2): 2 [IU] via SUBCUTANEOUS
  Administered 2014-04-01: 5 [IU] via SUBCUTANEOUS
  Administered 2014-04-01: 2 [IU] via SUBCUTANEOUS

## 2014-03-30 MED ORDER — SODIUM CHLORIDE 0.9 % IJ SOLN
10.0000 mL | INTRAMUSCULAR | Status: DC | PRN
  Administered 2014-03-30 – 2014-04-01 (×2): 10 mL

## 2014-03-30 MED ORDER — ATORVASTATIN CALCIUM 80 MG PO TABS
80.0000 mg | ORAL_TABLET | Freq: Every day | ORAL | Status: DC
  Administered 2014-03-30 – 2014-04-01 (×3): 80 mg via ORAL
  Filled 2014-03-30 (×3): qty 1

## 2014-03-30 MED ORDER — HYDRALAZINE HCL 25 MG PO TABS
25.0000 mg | ORAL_TABLET | Freq: Three times a day (TID) | ORAL | Status: DC
  Administered 2014-03-30 – 2014-03-31 (×2): 25 mg via ORAL
  Filled 2014-03-30 (×7): qty 1

## 2014-03-30 MED ORDER — MAGNESIUM SULFATE 40 MG/ML IJ SOLN
2.0000 g | Freq: Once | INTRAMUSCULAR | Status: AC
  Administered 2014-03-30: 2 g via INTRAVENOUS
  Filled 2014-03-30: qty 50

## 2014-03-30 MED ORDER — MAGNESIUM OXIDE 400 (241.3 MG) MG PO TABS
400.0000 mg | ORAL_TABLET | Freq: Two times a day (BID) | ORAL | Status: DC
  Administered 2014-03-30: 400 mg via ORAL
  Filled 2014-03-30 (×3): qty 1

## 2014-03-30 MED ORDER — VANCOMYCIN HCL 10 G IV SOLR
2000.0000 mg | Freq: Two times a day (BID) | INTRAVENOUS | Status: DC
  Administered 2014-03-30 – 2014-04-01 (×4): 2000 mg via INTRAVENOUS
  Filled 2014-03-30 (×6): qty 2000

## 2014-03-30 MED ORDER — ALBUTEROL SULFATE (2.5 MG/3ML) 0.083% IN NEBU: 2.5000 mg | INHALATION_SOLUTION | Freq: Every day | RESPIRATORY_TRACT | Status: DC | PRN

## 2014-03-30 MED ORDER — INSULIN GLARGINE 100 UNIT/ML ~~LOC~~ SOLN
25.0000 [IU] | Freq: Every day | SUBCUTANEOUS | Status: DC
  Administered 2014-03-30: 25 [IU] via SUBCUTANEOUS
  Filled 2014-03-30 (×3): qty 0.25

## 2014-03-30 MED ORDER — POTASSIUM CHLORIDE CRYS ER 20 MEQ PO TBCR
60.0000 meq | EXTENDED_RELEASE_TABLET | Freq: Two times a day (BID) | ORAL | Status: DC
  Administered 2014-03-30: 60 meq via ORAL
  Filled 2014-03-30 (×3): qty 3

## 2014-03-30 MED ORDER — ONDANSETRON HCL 4 MG PO TABS: 4.0000 mg | ORAL_TABLET | Freq: Four times a day (QID) | ORAL | Status: DC | PRN

## 2014-03-30 MED ORDER — ACETAMINOPHEN 650 MG RE SUPP: 650.0000 mg | Freq: Four times a day (QID) | RECTAL | Status: DC | PRN

## 2014-03-30 MED ORDER — DIGOXIN 125 MCG PO TABS
0.1250 mg | ORAL_TABLET | Freq: Every day | ORAL | Status: DC
  Administered 2014-03-30 – 2014-04-01 (×3): 0.125 mg via ORAL
  Filled 2014-03-30 (×3): qty 1

## 2014-03-30 MED ORDER — ONDANSETRON HCL 4 MG/2ML IJ SOLN: 4.0000 mg | Freq: Four times a day (QID) | INTRAMUSCULAR | Status: DC | PRN

## 2014-03-30 MED ORDER — MILRINONE IN DEXTROSE 20 MG/100ML IV SOLN
0.2500 ug/kg/min | INTRAVENOUS | Status: DC
  Administered 2014-03-30 – 2014-04-01 (×7): 0.25 ug/kg/min via INTRAVENOUS
  Filled 2014-03-30 (×8): qty 100

## 2014-03-30 MED ORDER — ISOSORBIDE MONONITRATE ER 30 MG PO TB24
30.0000 mg | ORAL_TABLET | Freq: Every day | ORAL | Status: DC
  Administered 2014-03-31 – 2014-04-01 (×2): 30 mg via ORAL
  Filled 2014-03-30 (×3): qty 1

## 2014-03-30 MED ORDER — SODIUM CHLORIDE 0.9 % IJ SOLN: 3.0000 mL | Freq: Two times a day (BID) | INTRAMUSCULAR | Status: DC

## 2014-03-30 MED ORDER — CARVEDILOL 3.125 MG PO TABS
3.1250 mg | ORAL_TABLET | Freq: Two times a day (BID) | ORAL | Status: DC
  Administered 2014-03-30 – 2014-04-01 (×3): 3.125 mg via ORAL
  Filled 2014-03-30 (×7): qty 1

## 2014-03-30 MED ORDER — PIPERACILLIN-TAZOBACTAM 3.375 G IVPB
3.3750 g | Freq: Three times a day (TID) | INTRAVENOUS | Status: DC
  Administered 2014-03-30 (×3): 3.375 g via INTRAVENOUS
  Filled 2014-03-30 (×6): qty 50

## 2014-03-30 MED ORDER — BUMETANIDE 2 MG PO TABS
4.0000 mg | ORAL_TABLET | Freq: Two times a day (BID) | ORAL | Status: DC
  Administered 2014-03-30 – 2014-04-01 (×4): 4 mg via ORAL
  Filled 2014-03-30 (×7): qty 2

## 2014-03-30 MED ORDER — ACETAMINOPHEN 325 MG PO TABS
650.0000 mg | ORAL_TABLET | Freq: Four times a day (QID) | ORAL | Status: DC | PRN
Start: 2014-03-30 — End: 2014-04-01
  Administered 2014-03-30: 650 mg via ORAL
  Filled 2014-03-30: qty 2

## 2014-03-30 MED ORDER — SPIRONOLACTONE 50 MG PO TABS
50.0000 mg | ORAL_TABLET | Freq: Every day | ORAL | Status: DC
  Administered 2014-03-30 – 2014-04-01 (×3): 50 mg via ORAL
  Filled 2014-03-30 (×3): qty 1

## 2014-03-30 MED ORDER — POTASSIUM CHLORIDE CRYS ER 20 MEQ PO TBCR
40.0000 meq | EXTENDED_RELEASE_TABLET | ORAL | Status: AC
  Administered 2014-03-30 (×3): 40 meq via ORAL
  Filled 2014-03-30 (×2): qty 2

## 2014-03-30 MED ORDER — POTASSIUM CHLORIDE 10 MEQ/100ML IV SOLN
10.0000 meq | INTRAVENOUS | Status: DC
  Filled 2014-03-30 (×6): qty 100

## 2014-03-30 MED ORDER — VANCOMYCIN HCL 10 G IV SOLR
2500.0000 mg | Freq: Once | INTRAVENOUS | Status: AC
  Administered 2014-03-30: 2500 mg via INTRAVENOUS
  Filled 2014-03-30: qty 2500

## 2014-03-30 MED ORDER — HYDROCODONE-ACETAMINOPHEN 5-325 MG PO TABS
1.0000 | ORAL_TABLET | Freq: Four times a day (QID) | ORAL | Status: DC | PRN
  Administered 2014-03-30 – 2014-04-01 (×5): 2 via ORAL
  Filled 2014-03-30 (×5): qty 2

## 2014-03-30 NOTE — Progress Notes (Signed)
CRITICAL VALUE ALERT  Critical value received:  k 2.5   Date of notification:  03/30/2014  Time of notification:  07:30  Critical value read back:yes  Nurse who received alert:  Jacklynn Ganong   MD notified (1st page):  Dr. Fidel Levy  Time of first page:  07:45  MD notified (2nd page):  Time of second page:  Responding MD:  Dr. Waymon Amato  Time MD responded:  07:45

## 2014-03-30 NOTE — Consult Note (Signed)
Will I&D in OR tomorrow.  Marta Lamas. Gae Bon, MD, FACS [phone] [facility]

## 2014-03-30 NOTE — Progress Notes (Signed)
ANTIBIOTIC CONSULT NOTE - INITIAL  Pharmacy Consult for vancomycin and zosyn  Indication: perirectal abscess  Allergies  Allergen Reactions  . Iodine Anaphylaxis  . Shellfish Allergy Anaphylaxis    Patient Measurements: Height: 5\' 5"  (165.1 cm) Weight: 338 lb (153.316 kg) IBW/kg (Calculated) : 61.5 Adjusted Body Weight:   Vital Signs: Temp: 100.2 F (37.9 C) (04/09 0052) Temp src: Oral (04/09 0052) BP: 91/57 mmHg (04/09 0052) Pulse Rate: 72 (04/09 0131) Intake/Output from previous day:   Intake/Output from this shift:    Labs:  Recent Labs  03/29/14 2145  WBC 15.1*  HGB 13.4  PLT 337  CREATININE 1.20   Estimated Creatinine Clearance: 119.3 ml/min (by C-G formula based on Cr of 1.2). No results found for this basename: VANCOTROUGH, VANCOPEAK, VANCORANDOM, GENTTROUGH, GENTPEAK, GENTRANDOM, TOBRATROUGH, TOBRAPEAK, TOBRARND, AMIKACINPEAK, AMIKACINTROU, AMIKACIN,  in the last 72 hours   Microbiology: No results found for this or any previous visit (from the past 720 hour(s)).  Medical History: Past Medical History  Diagnosis Date  . CHF (congestive heart failure)   . Diabetes   . HTN (hypertension)   . Morbid obesity with BMI of 50.0-59.9, adult   . Sleep apnea     on Bi Pap  . Dyslipidemia   . CKD (chronic kidney disease), stage III   . Acute on chronic systolic CHF (congestive heart failure) 01/03/2014  . Asthma 01/03/2014  . Chronic systolic heart failure 02/20/2014  . DKA (diabetic ketoacidoses) 02/25/2014  . Hypokalemia 01/04/2014  . Hyponatremia 02/25/2014  . Microcytic anemia 01/04/2014  . 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 Normal coronary arteries- last cath Oct 2014 at [facility] 01/03/2014  . History of chicken pox     Medications:  Prescriptions prior to admission  Medication Sig Dispense Refill  . acetaminophen (TYLENOL) 325 MG tablet Take 325 mg by mouth every 6 (six) hours as needed for mild pain.      Marland Kitchen albuterol (PROVENTIL  HFA;VENTOLIN HFA) 108 (90 BASE) MCG/ACT inhaler Inhale 2 puffs into the lungs daily as needed for wheezing or shortness of breath.      Marland Kitchen amoxicillin-clavulanate (AUGMENTIN) 875-125 MG per tablet Take 1 tablet by mouth 2 (two) times daily.  28 tablet  0  . atorvastatin (LIPITOR) 80 MG tablet Take 80 mg by mouth daily.      . bumetanide (BUMEX) 2 MG tablet Take 2 tablets (4 mg total) by mouth 2 (two) times daily.  120 tablet  6  . carvedilol (COREG) 3.125 MG tablet Take 1 tablet (3.125 mg total) by mouth 2 (two) times daily.  60 tablet  3  . digoxin (LANOXIN) 0.125 MG tablet Take 1 tablet (0.125 mg total) by mouth daily.  30 tablet  6  . hydrALAZINE (APRESOLINE) 25 MG tablet Take 1 tablet (25 mg total) by mouth every 8 (eight) hours.  90 tablet  6  . HYDROcodone-acetaminophen (NORCO) 5-325 MG per tablet Take 1-2 tablets by mouth every 6 (six) hours as needed.  30 tablet  0  . insulin aspart (NOVOLOG FLEXPEN) 100 UNIT/ML FlexPen Inject 5 Units into the skin 3 (three) times daily with meals.  15 mL  11  . insulin glargine (LANTUS) 100 UNIT/ML injection Inject 0.25 mLs (25 Units total) into the skin at bedtime.  10 mL  11  . isosorbide mononitrate (IMDUR) 30 MG 24 hr tablet Take 1 tablet (30 mg total) by mouth daily.  30 tablet  6  . magnesium oxide (MAG-OX) 400 MG tablet Take  1 tablet (400 mg total) by mouth 2 (two) times daily.  60 tablet  3  . metolazone (ZAROXOLYN) 5 MG tablet Take 1 tablet (5 mg total) by mouth as needed. Weight gain 3 pounds in 24 hours  8 tablet  6  . milrinone (PRIMACOR) 20 MG/100ML SOLN infusion Inject 41.825 mcg/min into the vein continuous.  100 mL  6  . potassium chloride SA (K-DUR,KLOR-CON) 20 MEQ tablet Take 3 tablets (60 mEq total) by mouth 2 (two) times daily.  90 tablet  3  . spironolactone (ALDACTONE) 25 MG tablet Take 0.5 tablets (12.5 mg total) by mouth daily.  15 tablet  3   Assessment: Worsening perirectal abscess with I+D. Failed augmentin. vanc and zosyn for  broad coverage.   Goal of Therapy:  Vancomycin trough level 15-20 mcg/ml  Plan:  Vancomycin 2.5 gm x1 then 2gm q12h  Zosyn 3.375 gm q8h   Janice CoffinWilliam Jonathan Juanelle Trueheart 03/30/2014,3:13 AM

## 2014-03-30 NOTE — Consult Note (Signed)
[place]. 1976-09-25  [id number].   Requesting MD: Dr. Gean Birchwood Chief Complaint/Reason for Consult: perirectal abscess HPI: This is a 38 yo black male with multiple medical problems who presented to the [facility] 7 days ago with a perirectal abscess and she was sent to our urgent office for I&D by Dr. Ninfa Linden.  The patient felt better the day after the procedure was done.  However, over the next several days it began swelling again with continued purulent drainage.  It became more painful.  He represented to the [facility] last night.  He was admitted by medicine and we have been asked to see for further evaluation.  ROS: Please seeHPI, otherwise all other systems are negative.  Family History  Problem Relation Age of Onset  . Hypertension Mother   . Diabetes Maternal Grandmother     Past Medical History  Diagnosis Date  . CHF (congestive heart failure)   . Diabetes   . HTN (hypertension)   . Morbid obesity with BMI of 50.0-59.9, adult   . Sleep apnea     on Bi Pap  . Dyslipidemia   . CKD (chronic kidney disease), stage III   . Acute on chronic systolic CHF (congestive heart failure) 01/03/2014  . Asthma 01/03/2014  . Chronic systolic heart failure 12/25/9700  . DKA (diabetic ketoacidoses) 02/25/2014  . Hypokalemia 01/04/2014  . Hyponatremia 02/25/2014  . Microcytic anemia 01/04/2014  . 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 Normal coronary arteries- last cath Oct 2014 at [facility] 01/03/2014  . History of chicken pox     History reviewed. No pertinent past surgical history.  Social History:  reports that he has never smoked. He does not have any smokeless tobacco history on file. He reports that he does not drink alcohol or use illicit drugs.  Allergies:  Allergies  Allergen Reactions  . Iodine Anaphylaxis  . Shellfish Allergy Anaphylaxis    Medications Prior to Admission  Medication Sig Dispense Refill  . acetaminophen (TYLENOL) 325 MG tablet Take 325 mg by  mouth every 6 (six) hours as needed for mild pain.      Marland Kitchen albuterol (PROVENTIL HFA;VENTOLIN HFA) 108 (90 BASE) MCG/ACT inhaler Inhale 2 puffs into the lungs daily as needed for wheezing or shortness of breath.      Marland Kitchen amoxicillin-clavulanate (AUGMENTIN) 875-125 MG per tablet Take 1 tablet by mouth 2 (two) times daily.  28 tablet  0  . atorvastatin (LIPITOR) 80 MG tablet Take 80 mg by mouth daily.      . bumetanide (BUMEX) 2 MG tablet Take 2 tablets (4 mg total) by mouth 2 (two) times daily.  120 tablet  6  . carvedilol (COREG) 3.125 MG tablet Take 1 tablet (3.125 mg total) by mouth 2 (two) times daily.  60 tablet  3  . digoxin (LANOXIN) 0.125 MG tablet Take 1 tablet (0.125 mg total) by mouth daily.  30 tablet  6  . hydrALAZINE (APRESOLINE) 25 MG tablet Take 1 tablet (25 mg total) by mouth every 8 (eight) hours.  90 tablet  6  . HYDROcodone-acetaminophen (NORCO) 5-325 MG per tablet Take 1-2 tablets by mouth every 6 (six) hours as needed.  30 tablet  0  . insulin aspart (NOVOLOG FLEXPEN) 100 UNIT/ML FlexPen Inject 5 Units into the skin 3 (three) times daily with meals.  15 mL  11  . insulin glargine (LANTUS) 100 UNIT/ML injection Inject 0.25 mLs (25 Units total) into the skin at bedtime.  10 mL  11  .  isosorbide mononitrate (IMDUR) 30 MG 24 hr tablet Take 1 tablet (30 mg total) by mouth daily.  30 tablet  6  . magnesium oxide (MAG-OX) 400 MG tablet Take 1 tablet (400 mg total) by mouth 2 (two) times daily.  60 tablet  3  . metolazone (ZAROXOLYN) 5 MG tablet Take 1 tablet (5 mg total) by mouth as needed. Weight gain 3 pounds in 24 hours  8 tablet  6  . milrinone (PRIMACOR) 20 MG/100ML SOLN infusion Inject 41.825 mcg/min into the vein continuous.  100 mL  6  . potassium chloride SA (K-DUR,KLOR-CON) 20 MEQ tablet Take 3 tablets (60 mEq total) by mouth 2 (two) times daily.  90 tablet  3  . spironolactone (ALDACTONE) 25 MG tablet Take 0.5 tablets (12.5 mg total) by mouth daily.  15 tablet  3    Blood  pressure 105/63, pulse 98, temperature 97.8 F (36.6 C), temperature source Oral, resp. rate 18, height $RemoveBe'5\' 5"'IapcQULZj$  (1.651 m), weight 338 lb (153.316 kg), SpO2 92.00%. Physical Exam: General: pleasant, morbidly obese black male who is laying in bed in NAD HEENT: head is normocephalic, atraumatic.  Sclera are noninjected.  PERRL.  Ears and nose without any masses or lesions, CPAP in place.  Mouth is pink and moist Heart: regular, rate, and rhythm.  Normal s1,s2. No obvious murmurs, gallops, or rubs noted.  Palpable radial and pedal pulses bilaterally Lungs: CTAB, no wheezes, rhonchi, or rales noted.  Respiratory effort nonlabored Abd: soft, NT, ND, +BS, no masses, hernias, or organomegaly Buttock: Large tennis to softball size abscess on his left gluteus/perirectal area.  There is moderated amount of purulent drainage.  He has extensive induration tracking up the left gluteus.  Large fluctuant area is noted in the most central part of this area. Skin: warm and dry with no masses, lesions, or rashes Psych: A&Ox3 with an appropriate affect.    Results for orders placed during the hospital encounter of 03/29/14 (from the past 48 hour(s))  CBC WITH DIFFERENTIAL     Status: Abnormal   Collection Time    03/29/14  9:45 PM      Result Value Ref Range   WBC 15.1 (*) 4.0 - 10.5 K/uL   RBC 5.17  4.22 - 5.81 MIL/uL   Hemoglobin 13.4  13.0 - 17.0 g/dL   HCT 38.8 (*) 39.0 - 52.0 %   MCV 75.0 (*) 78.0 - 100.0 fL   MCH 25.9 (*) 26.0 - 34.0 pg   MCHC 34.5  30.0 - 36.0 g/dL   RDW 17.1 (*) 11.5 - 15.5 %   Platelets 337  150 - 400 K/uL   Neutrophils Relative % 81 (*) 43 - 77 %   Lymphocytes Relative 9 (*) 12 - 46 %   Monocytes Relative 9  3 - 12 %   Eosinophils Relative 1  0 - 5 %   Basophils Relative 0  0 - 1 %   Neutro Abs 12.1 (*) 1.7 - 7.7 K/uL   Lymphs Abs 1.4  0.7 - 4.0 K/uL   Monocytes Absolute 1.4 (*) 0.1 - 1.0 K/uL   Eosinophils Absolute 0.2  0.0 - 0.7 K/uL   Basophils Absolute 0.0  0.0 - 0.1  K/uL   RBC Morphology POLYCHROMASIA PRESENT     WBC Morphology ATYPICAL LYMPHOCYTES     Comment: TOXIC GRANULATION   Smear Review LARGE PLATELETS PRESENT    BASIC METABOLIC PANEL     Status: Abnormal   Collection Time  03/29/14  9:45 PM      Result Value Ref Range   Sodium 130 (*) 137 - 147 mEq/L   Potassium 2.5 (*) 3.7 - 5.3 mEq/L   Comment: CRITICAL RESULT CALLED TO, READ BACK BY AND VERIFIED WITH:     Weyman Croon (RN) 2245 03/29/2014 L.LOMAX   Chloride 76 (*) 96 - 112 mEq/L   CO2 36 (*) 19 - 32 mEq/L   Glucose, Bld 182 (*) 70 - 99 mg/dL   BUN 33 (*) 6 - 23 mg/dL   Creatinine, Ser 8.28  0.50 - 1.35 mg/dL   Calcium 9.1  8.4 - 00.3 mg/dL   GFR calc non Af Amer 77 (*) >90 mL/min   GFR calc Af Amer 89 (*) >90 mL/min   Comment: (NOTE)     The eGFR has been calculated using the CKD EPI equation.     This calculation has not been validated in all clinical situations.     eGFR's persistently <90 mL/min signify possible Chronic Kidney     Disease.  GLUCOSE, CAPILLARY     Status: Abnormal   Collection Time    03/30/14 12:55 AM      Result Value Ref Range   Glucose-Capillary 104 (*) 70 - 99 mg/dL  BASIC METABOLIC PANEL     Status: Abnormal   Collection Time    03/30/14  5:00 AM      Result Value Ref Range   Sodium 136 (*) 137 - 147 mEq/L   Potassium 2.5 (*) 3.7 - 5.3 mEq/L   Comment: CRITICAL RESULT CALLED TO, READ BACK BY AND VERIFIED WITH:     MELTON T RN 03/30/14 0712 COSTELLO B   Chloride 83 (*) 96 - 112 mEq/L   CO2 36 (*) 19 - 32 mEq/L   Glucose, Bld 115 (*) 70 - 99 mg/dL   BUN 31 (*) 6 - 23 mg/dL   Creatinine, Ser 4.91  0.50 - 1.35 mg/dL   Calcium 8.7  8.4 - 79.1 mg/dL   GFR calc non Af Amer 75 (*) >90 mL/min   GFR calc Af Amer 87 (*) >90 mL/min   Comment: (NOTE)     The eGFR has been calculated using the CKD EPI equation.     This calculation has not been validated in all clinical situations.     eGFR's persistently <90 mL/min signify possible Chronic Kidney      Disease.  DIGOXIN LEVEL     Status: Abnormal   Collection Time    03/30/14  5:00 AM      Result Value Ref Range   Digoxin Level 0.6 (*) 0.8 - 2.0 ng/mL  GLUCOSE, CAPILLARY     Status: Abnormal   Collection Time    03/30/14  8:12 AM      Result Value Ref Range   Glucose-Capillary 147 (*) 70 - 99 mg/dL   No results found.     Assessment/Plan 1. Large perirectal abscess 2. S/p recent bedside I&D of same abscess Patient Active Problem List   Diagnosis Date Noted  . Diabetes mellitus 03/30/2014  . Perirectal abscess 03/29/2014  . DKA (diabetic ketoacidoses) 02/25/2014  . Hyponatremia 02/25/2014  . Chronic systolic heart failure 02/20/2014  . CKD (chronic kidney disease), stage III 02/20/2014  . Acute on chronic systolic heart failure 01/11/2014  . Microcytic anemia 01/04/2014  . Hypokalemia 01/04/2014  . Acute on chronic systolic CHF (congestive heart failure) 01/03/2014  . 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 Normal coronary arteries-  last cath Oct 2014 at [facility] 01/03/2014  . Morbid obesity with BMI of 50.0-59.9, adult 01/03/2014  . Sleep apnea- he reports comliance with Bi Pap 01/03/2014  . HTN (hypertension) 01/03/2014  . Dyslipidemia 01/03/2014  . Asthma 01/03/2014   Plan: 1. The patient will need an incision and drainage of this abscess in the OR.  I have called cardiology this morning for clearance.  We will await cards clearance and replacement of K prior to proceeding.  Given his K at 2.5, anesthesia will not put him to sleep right now, especially with his other cardiac issues.  Recheck BMET tomorrow and plan for OR tomorrow. 2. Carb mod diet today and NPO p MN.   3. I have discussed all of this with the patient and he understands. 4. Agree with clinda and vanc.  Henreitta Cea 03/30/2014, 8:27 AM Pager: [phone]

## 2014-03-30 NOTE — Progress Notes (Signed)
IV team unsuccessful with carb-ox blood draw from PICC; lab notified to attempt blood draw.  Will continue to monitor. Steven Wyatt [facility]

## 2014-03-30 NOTE — Consult Note (Signed)
Advanced Heart Failure Team Consult Note  Referring Physician: Dr Lindie Spruce Primary Physician:  Primary Cardiologist:  Dr Gala Romney  Reason for Consultation: Cardiac Clearance.   HPI:   The HF team was asked to provide a consult by Dr Lindie Spruce for cardiac clearance.   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 He has been followed closely in HF clinic and he was last seen 03/20/14 and Milrinone was cut back 0.25 mcg.  Weight at that time was 339 pounds on bumex 4 mg twice a day and spiro 12.5 mg daily.   He presented to [facility] ED with worsening perirectal abscess despite outpatient management by general surgery. . Started on vancomycin and zosyn.  Plan for I and D tomorrow .   Denies SOB/PND/Orthopnea. Doing great at home. Weight at home 338 pounds (weight down 84 pounds since January).     FH: No CHF, no SCD that he knows of.  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 Moved from [place].    Review of Systems: [y] = yes, [ ]  = no   General: Weight gain [ ] ; Weight loss [ Y]; Anorexia [ ] ; Fatigue [ Y]; Fever [ ] ; Chills [ ] ; Weakness [ ]   Cardiac: Chest pain/pressure [ ] ; Resting SOB [ ] ; Exertional SOB [Y ]; Orthopnea [ ] ; Pedal Edema [ ] ; Palpitations [ ] ; Syncope [ ] ; Presyncope [ ] ; Paroxysmal nocturnal dyspnea[ ]   Pulmonary: Cough [ ] ; Wheezing[ ] ; Hemoptysis[ ] ; Sputum [ ] ; Snoring [ ]   GI: Vomiting[ ] ; Dysphagia[ ] ; Melena[ ] ; Hematochezia [ ] ; Heartburn[ ] ; Abdominal pain [ ] ; Constipation [ ] ; Diarrhea [ ] ; BRBPR [ ]   GU: Hematuria[ ] ; Dysuria [ ] ; Nocturia[ ]   Vascular: Pain in legs with walking [ ] ; Pain in feet with lying flat [ ] ; Non-healing sores [ ] ; Stroke [ ] ; TIA [ ] ; Slurred speech [ ] ;  Neuro: Headaches[ ] ; Vertigo[ ] ; Seizures[ ] ; Paresthesias[ ] ;Blurred vision [ ] ;  Diplopia [ ] ; Vision changes [ ]   Ortho/Skin: Arthritis [ ] ; Joint pain [Y ]; Muscle pain [ ] ; Joint swelling [ ] ; Back Pain [ ] ; Rash [ ]   Psych: Depression[ ] ; Anxiety[ ]   Heme: Bleeding problems [ ] ; Clotting disorders [ ] ; Anemia [ ]   Endocrine: Diabetes [Y ]; Thyroid dysfunction[ ]   Home Medications Prior to Admission medications   Medication Sig Start Date End Date Taking? Authorizing Provider  acetaminophen (TYLENOL) 325 MG tablet Take 325 mg by mouth every 6 (six) hours as needed for mild pain.   Yes Historical Provider, MD  albuterol (PROVENTIL HFA;VENTOLIN HFA) 108 (90 BASE) MCG/ACT inhaler Inhale 2 puffs into the lungs daily as needed for wheezing or shortness of breath. 01/08/14  Yes Wilburt Finlay, PA-C  amoxicillin-clavulanate (AUGMENTIN) 875-125 MG per tablet Take 1 tablet by mouth 2 (two) times daily. 03/23/14 04/06/14 Yes Shelly Rubenstein, MD  atorvastatin (LIPITOR) 80 MG tablet Take 80 mg by mouth daily. 01/08/14  Yes Wilburt Finlay, PA-C  bumetanide (BUMEX) 2 MG tablet Take 2 tablets (4 mg total) by mouth 2 (two) times daily. 01/16/14  Yes Amy D Clegg, NP  carvedilol (COREG) 3.125 MG tablet Take 1 tablet (3.125 mg total) by mouth 2 (two) times daily. 02/20/14  Yes Reuel Boom  R Wyett Narine, MD  digoxin (LANOXIN) 0.125 MG tablet Take 1 tablet (0.125 mg total) by mouth daily. 01/16/14  Yes Amy D Clegg, NP  hydrALAZINE (APRESOLINE) 25 MG tablet Take 1 tablet (25 mg total) by mouth every 8 (eight) hours. 01/23/14  Yes Amy D Clegg, NP  HYDROcodone-acetaminophen (NORCO) 5-325 MG per tablet Take 1-2 tablets by mouth every 6 (six) hours as needed. 03/23/14  Yes Shelly Rubenstein, MD  insulin aspart (NOVOLOG FLEXPEN) 100 UNIT/ML FlexPen Inject 5 Units into the skin 3 (three) times daily with meals. 03/09/14  Yes Baltazar Apo, PA-C  insulin glargine (LANTUS) 100 UNIT/ML injection Inject 0.25 mLs (25 Units total) into the skin at bedtime. 02/26/14  Yes Hollice Espy, MD  isosorbide mononitrate (IMDUR) 30 MG  24 hr tablet Take 1 tablet (30 mg total) by mouth daily. 01/16/14  Yes Amy D Clegg, NP  magnesium oxide (MAG-OX) 400 MG tablet Take 1 tablet (400 mg total) by mouth 2 (two) times daily. 03/07/14  Yes Aundria Rud, NP  metolazone (ZAROXOLYN) 5 MG tablet Take 1 tablet (5 mg total) by mouth as needed. Weight gain 3 pounds in 24 hours 01/16/14  Yes Amy D Clegg, NP  milrinone (PRIMACOR) 20 MG/100ML SOLN infusion Inject 41.825 mcg/min into the vein continuous. 03/28/14  Yes Aundria Rud, NP  potassium chloride SA (K-DUR,KLOR-CON) 20 MEQ tablet Take 3 tablets (60 mEq total) by mouth 2 (two) times daily. 03/13/14  Yes Dolores Patty, MD  spironolactone (ALDACTONE) 25 MG tablet Take 0.5 tablets (12.5 mg total) by mouth daily. 03/20/14  Yes Laurey Morale, MD    Past Medical History: Past Medical History  Diagnosis Date  . CHF (congestive heart failure)   . Diabetes   . HTN (hypertension)   . Morbid obesity with BMI of 50.0-59.9, adult   . Sleep apnea     on Bi Pap  . Dyslipidemia   . CKD (chronic kidney disease), stage III   . Acute on chronic systolic CHF (congestive heart failure) 01/03/2014  . Asthma 01/03/2014  . Chronic systolic heart failure 02/20/2014  . DKA (diabetic ketoacidoses) 02/25/2014  . Hypokalemia 01/04/2014  . Hyponatremia 02/25/2014  . Microcytic anemia 01/04/2014  . 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 Normal coronary arteries- last cath Oct 2014 at [facility] 01/03/2014  . History of chicken pox     Past Surgical History: History reviewed. No pertinent past surgical history.  Family History: Family History  Problem Relation Age of Onset  . Hypertension Mother   . Diabetes Maternal Grandmother     Social History: History   Social History  . Marital Status: Married    Spouse Name: N/A    Number of Children: N/A  . Years of Education: N/A   Social History Main Topics  . Smoking status: Never Smoker   . Smokeless tobacco: None  . Alcohol Use: No  . Drug  Use: No  . Sexual Activity: Yes   Other Topics Concern  . None   Social History Narrative  . None    Allergies:  Allergies  Allergen Reactions  . Iodine Anaphylaxis  . Shellfish Allergy Anaphylaxis    Objective:    Vital Signs:   Temp:  [97.8 F (36.6 C)-100.2 F (37.9 C)] 97.8 F (36.6 C) (04/09 0458) Pulse Rate:  [50-108] 97 (04/09 0836) Resp:  [16-22] 18 (04/09 0458) BP: (80-128)/(42-97) 115/83 mmHg (04/09 0836) SpO2:  [92 %-100 %] 92 % (04/09 0458) Weight:  [409  lb (153.316 kg)] 338 lb (153.316 kg) (04/09 0131) Last BM Date: 03/23/14  Weight change: Filed Weights   03/29/14 2005 03/30/14 0131  Weight: 338 lb (153.316 kg) 338 lb (153.316 kg)    Intake/Output:  No intake or output data in the 24 hours ending 03/30/14 [id number]   Physical Exam: General:  Well appearing. No resp difficulty sitting on the side of the bed.   HEENT: normal Neck: supple. JVP difficult to assess due to body habitus. Carotids 2+ bilat; no bruits. No lymphadenopathy or thryomegaly appreciated. Cor: PMI nondisplaced. Tachy , Regular rate & rhythm. No rubs, gallops or murmurs. Lungs: clear Abdomen:obese, soft, nontender, nondistended. No hepatosplenomegaly. No bruits or masses. Good bowel sounds. Extremities: no cyanosis, clubbing, rash, edema RUE PICC Neuro: alert & orientedx3, cranial nerves grossly intact. moves all 4 extremities w/o difficulty. Affect pleasant  Telemetry:  ST 100s   Labs: Basic Metabolic Panel:  Recent Labs Lab 03/24/14 1630 03/29/14 2145 03/30/14 0500  NA 125* 130* 136*  K 2.9* 2.5* 2.5*  CL 75* 76* 83*  CO2 35* 36* 36*  GLUCOSE 403* 182* 115*  BUN 29* 33* 31*  CREATININE 2.00* 1.20 1.22  CALCIUM 8.1* 9.1 8.7  MG 1.5  --   --     Liver Function Tests: No results found for this basename: AST, ALT, ALKPHOS, BILITOT, PROT, ALBUMIN,  in the last 168 hours No results found for this basename: LIPASE, AMYLASE,  in the last 168 hours No results found for this  basename: AMMONIA,  in the last 168 hours  CBC:  Recent Labs Lab 03/24/14 1630 03/29/14 2145 03/30/14 0500  WBC 14.4* 15.1* 13.4*  NEUTROABS 12.4* 12.1*  --   HGB 11.4* 13.4 11.6*  HCT 34.5* 38.8* 35.3*  MCV 75.7* 75.0* 76.1*  PLT 228 337 328    Cardiac Enzymes: No results found for this basename: CKTOTAL, CKMB, CKMBINDEX, TROPONINI,  in the last 168 hours  BNP: BNP (last 3 results)  Recent Labs  01/03/14 1248 01/11/14 1121  PROBNP 1164.0* 2156.0*    CBG:  Recent Labs Lab 03/30/14 0055 03/30/14 0812  GLUCAP 104* 147*    Coagulation Studies: No results found for this basename: LABPROT, INR,  in the last 72 hours  Other results: EKG: ST 103 bpm    Imaging:  No results found.   Medications:     Current Medications: . atorvastatin  80 mg Oral Daily  . bumetanide  4 mg Oral BID  . carvedilol  3.125 mg Oral BID WC  . digoxin  0.125 mg Oral Daily  . hydrALAZINE  25 mg Oral 3 times per day  . insulin aspart  0-9 Units Subcutaneous TID WC  . insulin glargine  25 Units Subcutaneous QHS  . isosorbide mononitrate  30 mg Oral Daily  . magnesium sulfate 1 - 4 g bolus IVPB  2 g Intravenous Once  . piperacillin-tazobactam (ZOSYN)  IV  3.375 g Intravenous 3 times per day  . potassium chloride  40 mEq Oral Q4H  . sodium chloride  3 mL Intravenous Q12H  . sodium chloride  3 mL Intravenous Q12H  . spironolactone  12.5 mg Oral Daily  . vancomycin  2,000 mg Intravenous Q12H     Infusions: . milrinone 0.25 mcg/kg/min (03/30/14 0306)      Assessment:  1. Perirectal Abscess 2. Chronic Systolic Heart Failure- on home milrinone 0.25 mcg 3. OAS- on BiPap 4. DM 5. Morbid Obesity 6. Hypokalemia   Plan/Discussion:  Mr Coombs is well known to the HF team due to chronic systolic heart failure on home milrinone. From HF perspective he is stable. Continue Milrinone at 0.25 mcg and check CO-OX today. Continue digoxin 0.125 mg daily. Volume status stable continue  bumex 4 mg twice a day. Increase spiro to 50 mg daily to help with hypokalemia. Continue hydralazine 25 mg tid/imdur 30 mg daily. He has not been on ace due to ckd. Check ECHO today.   He will need to resume HH once discharged with [facility].   Dr Gala Romney to follow with cardiac clearance.   Length of Stay: 1  Amy D Clegg NP-C  03/30/2014, 9:28 AM  Advanced Heart Failure Team Pager [phone] (M-F; 7a - 4p)  Please contact [facility] for night-coverage after hours (4p -7a ) and weekends on [email or web address]  Patient seen and examined with Tonye Becket, NP. We discussed all aspects of the encounter. I agree with the assessment and plan as stated above.   Doing very well from HF perspective. He is euvolemic. Failed milrinone wean as outpatient so will continue on current dose. Will check echo and co-ox today.   Given severe LV dysfunction would recommend any procedures be done with local anesthesia and conscious sedation and not GA, if at all possible. If needs GA he will be at moderate to high risk for peri-op complications.   Once infection is completely cleared will need to begin eval for ICD and possibly, VAD.   Bevelyn Buckles Breena Bevacqua,MD 10:21 AM

## 2014-03-30 NOTE — H&P (Addendum)
[facility] History and Physical  [employer] ZOX:[id number] DOB: 12-09-1976 DOA: 03/29/2014  Referring physician: ER physician. PCP: Baltazar Apo, PA-C   Chief Complaint: Worsening perirectal abscess.  HPI: Steven Wyatt. is a 38 y.o. male who was recently diagnosed with perirectal abscess and had I&D done and was placed on Augmentin by Dr. Magnus Ivan, surgeon started experiencing increasing drainage with pain. Dr. Magnus Ivan has reported redness notes that if patient's perirectal abscess gets worse patient will be needed to taken to operating room after cardiac clearance. Patient otherwise denies any chest pain shortness of breath nausea vomiting. Has not moved his bowels for last 3-4 days. Patient had history of nonischemic cardiomyopathy and is on milrinone.   Review of Systems: As presented in the history of presenting illness, rest negative.  Past Medical History  Diagnosis Date  . CHF (congestive heart failure)   . Diabetes   . HTN (hypertension)   . Morbid obesity with BMI of 50.0-59.9, adult   . Sleep apnea     on Bi Pap  . Dyslipidemia   . CKD (chronic kidney disease), stage III   . Acute on chronic systolic CHF (congestive heart failure) 01/03/2014  . Asthma 01/03/2014  . Chronic systolic heart failure 02/20/2014  . DKA (diabetic ketoacidoses) 02/25/2014  . Hypokalemia 01/04/2014  . Hyponatremia 02/25/2014  . Microcytic anemia 01/04/2014  . 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 Normal coronary arteries- last cath Oct 2014 at [facility] 01/03/2014  . History of chicken pox    History reviewed. No pertinent past surgical history. Social History:  reports that he has never smoked. He does not have any smokeless tobacco history on file. He reports that he does not drink alcohol or use illicit drugs. Where does patient live home. Can patient participate in ADLs? Yes.  Allergies  Allergen Reactions  . Iodine Anaphylaxis  . Shellfish Allergy Anaphylaxis     Family History:  Family History  Problem Relation Age of Onset  . Hypertension Mother   . Diabetes Maternal Grandmother       Prior to Admission medications   Medication Sig Start Date End Date Taking? Authorizing Provider  acetaminophen (TYLENOL) 325 MG tablet Take 325 mg by mouth every 6 (six) hours as needed for mild pain.   Yes Historical Provider, MD  albuterol (PROVENTIL HFA;VENTOLIN HFA) 108 (90 BASE) MCG/ACT inhaler Inhale 2 puffs into the lungs daily as needed for wheezing or shortness of breath. 01/08/14  Yes Wilburt Finlay, PA-C  amoxicillin-clavulanate (AUGMENTIN) 875-125 MG per tablet Take 1 tablet by mouth 2 (two) times daily. 03/23/14 04/06/14 Yes Shelly Rubenstein, MD  atorvastatin (LIPITOR) 80 MG tablet Take 80 mg by mouth daily. 01/08/14  Yes Wilburt Finlay, PA-C  bumetanide (BUMEX) 2 MG tablet Take 2 tablets (4 mg total) by mouth 2 (two) times daily. 01/16/14  Yes Amy D Clegg, NP  carvedilol (COREG) 3.125 MG tablet Take 1 tablet (3.125 mg total) by mouth 2 (two) times daily. 02/20/14  Yes Dolores Patty, MD  digoxin (LANOXIN) 0.125 MG tablet Take 1 tablet (0.125 mg total) by mouth daily. 01/16/14  Yes Amy D Clegg, NP  hydrALAZINE (APRESOLINE) 25 MG tablet Take 1 tablet (25 mg total) by mouth every 8 (eight) hours. 01/23/14  Yes Amy D Clegg, NP  HYDROcodone-acetaminophen (NORCO) 5-325 MG per tablet Take 1-2 tablets by mouth every 6 (six) hours as needed. 03/23/14  Yes Shelly Rubenstein, MD  insulin aspart (NOVOLOG FLEXPEN) 100 UNIT/ML FlexPen  Inject 5 Units into the skin 3 (three) times daily with meals. 03/09/14  Yes Baltazar ApoNancy Hartman, PA-C  insulin glargine (LANTUS) 100 UNIT/ML injection Inject 0.25 mLs (25 Units total) into the skin at bedtime. 02/26/14  Yes Hollice EspySendil K Krishnan, MD  isosorbide mononitrate (IMDUR) 30 MG 24 hr tablet Take 1 tablet (30 mg total) by mouth daily. 01/16/14  Yes Amy D Clegg, NP  magnesium oxide (MAG-OX) 400 MG tablet Take 1 tablet (400 mg total) by mouth 2 (two)  times daily. 03/07/14  Yes Aundria RudAli B Cosgrove, NP  metolazone (ZAROXOLYN) 5 MG tablet Take 1 tablet (5 mg total) by mouth as needed. Weight gain 3 pounds in 24 hours 01/16/14  Yes Amy D Clegg, NP  milrinone (PRIMACOR) 20 MG/100ML SOLN infusion Inject 41.825 mcg/min into the vein continuous. 03/28/14  Yes Aundria RudAli B Cosgrove, NP  potassium chloride SA (K-DUR,KLOR-CON) 20 MEQ tablet Take 3 tablets (60 mEq total) by mouth 2 (two) times daily. 03/13/14  Yes Dolores Pattyaniel R Bensimhon, MD  spironolactone (ALDACTONE) 25 MG tablet Take 0.5 tablets (12.5 mg total) by mouth daily. 03/20/14  Yes Laurey Moralealton S McLean, MD    Physical Exam: Filed Vitals:   03/29/14 2005 03/29/14 2228 03/29/14 2330 03/30/14 0000  BP: 128/64 112/74 125/65 118/97  Pulse: 108 103 65 94  Temp: 99.4 F (37.4 C)     TempSrc: Oral     Resp: 22 16 16    Height: 5\' 5"  (1.651 m)     Weight: 153.316 kg (338 lb)     SpO2: 100% 97% 94% 94%     General:  Morbidly obese.  Eyes: Anicteric no pallor.  ENT: No discharge from the ears eyes nose mouth.  Neck: No mass felt.  Cardiovascular: S1-S2 heard.  Respiratory: No rhonchi or crepitations.  Abdomen: Soft nontender. Active discharge from the perirectal area.  Skin: Active discharge from the perirectal area.  Musculoskeletal: No edema.  Psychiatric: Appears normal.  Neurologic: Alert awake oriented to time place and person. Moves all extremities.  Labs on Admission:  Basic Metabolic Panel:  Recent Labs Lab 03/24/14 1630 03/29/14 2145  NA 125* 130*  K 2.9* 2.5*  CL 75* 76*  CO2 35* 36*  GLUCOSE 403* 182*  BUN 29* 33*  CREATININE 2.00* 1.20  CALCIUM 8.1* 9.1  MG 1.5  --    Liver Function Tests: No results found for this basename: AST, ALT, ALKPHOS, BILITOT, PROT, ALBUMIN,  in the last 168 hours No results found for this basename: LIPASE, AMYLASE,  in the last 168 hours No results found for this basename: AMMONIA,  in the last 168 hours CBC:  Recent Labs Lab 03/24/14 1630  03/29/14 2145  WBC 14.4* 15.1*  NEUTROABS 12.4* 12.1*  HGB 11.4* 13.4  HCT 34.5* 38.8*  MCV 75.7* 75.0*  PLT 228 337   Cardiac Enzymes: No results found for this basename: CKTOTAL, CKMB, CKMBINDEX, TROPONINI,  in the last 168 hours  BNP (last 3 results)  Recent Labs  01/03/14 1248 01/11/14 1121  PROBNP 1164.0* 2156.0*   CBG: No results found for this basename: GLUCAP,  in the last 168 hours  Radiological Exams on Admission: No results found.   Assessment/Plan Principal Problem:   Perirectal abscess Active Problems:   NICM (nonischemic cardiomyopathy)   Sleep apnea- he reports comliance with Bi Pap   HTN (hypertension)   Hypokalemia   Diabetes mellitus   1. Perirectal abscess - patient has been placed on vancomycin and Zosyn. Dr. Donell BeersByerly, surgeon was  notified by ER physician. Patient will be kept n.p.o. past midnight in anticipation of possible procedure. Will need cardiac clearance given patient's complicated cardiac history. 2. Hypokalemia - probably from diuretics. I have ordered potassium chloride 60 mEq by mouth one dose and potassium chloride 10 mEq IV x2 doses were ordered by ER physician. Check magnesium. 3. Nonischemic cardiomyopathy on milrinone - continue milrinone and diuretics. Presently not decompensated. 4. Diabetes mellitus - continue insulin with sliding scale. 5. OSA - BiPAP per respiratory. 6. Hypertension - continue home medications.  Addendum - discussed with Dr.Byerly.   Code Status: Full code.  Family Communication: None.  Disposition Plan: Admit to inpatient.    Eduard Clos [facility] Pager [phone].  If 7PM-7AM, please contact night-coverage [email or web address] Password [facility] 03/30/2014, 12:24 AM

## 2014-03-30 NOTE — Progress Notes (Signed)
  Echocardiogram 2D Echocardiogram has been performed.  Arvil Chaco 03/30/2014, 4:06 PM

## 2014-03-30 NOTE — Progress Notes (Signed)
Utilization review completed. Amil Moseman, RN, BSN. 

## 2014-03-30 NOTE — ED Provider Notes (Signed)
I have personally seen and examined the patient.  I have discussed the plan of care with the resident.  I have reviewed the documentation on PMH/FH/Soc. History.  I have reviewed the documentation of the resident and agree.  I called reported to medicine on this patient He was stabilized in the ER  Joya Gaskins, MD 03/30/14 1840

## 2014-03-30 NOTE — Care Management Note (Signed)
    Page 1 of 1   03/30/2014     2:50:57 PM   CARE MANAGEMENT NOTE 03/30/2014  Patient:  Steven Wyatt, Steven Wyatt   Account Number:  [id number]  Date Initiated:  03/30/2014  Documentation initiated by:  Donn Pierini  Subjective/Objective Assessment:   Pt admitted with perirectal abcess     Action/Plan:   PTA pt lived at home- is active with [facility] for home milrinone gtt- will need resumption orders   Anticipated DC Date:  04/03/2014   Anticipated DC Plan:  HOME W HOME HEALTH SERVICES      DC Planning Services  CM consult      [facility] Choice  Resumption Of Svcs/PTA Provider   Choice offered to / List presented to:             Status of service:  In process, will continue to follow Medicare Important Message given?   (If response is "NO", the following Medicare IM given date fields will be blank) Date Medicare IM given:   Date Additional Medicare IM given:    Discharge Disposition:    Per UR Regulation:  Reviewed for med. necessity/level of care/duration of stay  If discussed at Long Length of Stay Meetings, dates discussed:    Comments:

## 2014-03-30 NOTE — Progress Notes (Signed)
Pt B/P 94/42, on call MD made aware, instructed to hold Hydralazine. Will continue to monitor patient.

## 2014-03-30 NOTE — Progress Notes (Signed)
Patient's BP 80/45, asymptomatic.  HF team & Dr. Waymon Amato notified. New orders received.  Will continue to monitor. Steven Wyatt [facility]

## 2014-03-30 NOTE — Progress Notes (Signed)
PROGRESS NOTE    [employer] VIF:[id number] DOB: [birth year] DOA: 03/29/2014 PCP: Baltazar Apo, PA-C Primary Cardiologist: Dr. Arvilla Meres.  HPI/Brief narrative 38 year old male with history of nonischemic cardiomyopathy, normal coronaries by angiography in October 2014, chronic systolic CHF, LVEF 20% by echo 12/2013, on chronic home milrinone via PIC, weight down 84 pounds since January, morbid obesity, OSA on nightly BiPAP, HTN,CKD, type II DM and HLD admitted on 03/30/14 secondary to worsening perirectal abscess despite outpatient management by surgery.   Assessment/Plan:  1. Perirectal abscess: Awaiting incision and drainage by surgeons. Cardiology has seen patient and recommend any procedures to be done with local anesthesia and conscious sedation and not GA if at all possible. If needs GA, he will be at moderate to high risk for perioperative complications. Continue IV vancomycin and Zosyn. 2. Profound hypokalemia: Secondary to diuresis. Continue to aggressively replace potassium. Check magnesium. For the increasing Aldactone. Follow BMP. 3. Chronic systolic CHF/nonischemic cardiomyopathy/LVEF 20%: Management per advanced heart failure team. Stable. Continue milrinone, digoxin, hydralazine and Imdur. Increasing Aldactone to 50 mg daily. Checking 2-D echo. Cardiology plans ICD after infection has completely cleared 4. Type II DM: Monitor CBCs. Continue Lantus and SSI. Fluctuating-probably precipitated by infection 5. Hypertension: Fluctuating blood pressures. Monitor closely. Management as above. 6. OSA: Continue nightly BiPAP. 7. Morbid obesity: Has lost 84 pounds since January.  8. Chronic kidney disease: Creatinine at baseline.   Code Status: Full Family Communication: None at bedside  Disposition Plan: Home when medically stable.   Consultants:  Advanced heart failure team  General surgery  Procedures:  None  Antibiotics:  IV Zosyn 4/9 >  IV vancomycin 4/9  >   Subjective: Rectal pain-currently controlled with pain medications. Denies dyspnea or chest pain.  Objective: Filed Vitals:   03/30/14 0836 03/30/14 1044 03/30/14 1306 03/30/14 1331  BP: 115/83 80/45 103/74 121/100  Pulse: 97 93 88   Temp:   97.7 F (36.5 C)   TempSrc:   Oral   Resp:   18   Height:      Weight:      SpO2:   92%     Intake/Output Summary (Last 24 hours) at 03/30/14 1351 Last data filed at 03/30/14 1307  Gross per 24 hour  Intake    120 ml  Output   1425 ml  Net  -1305 ml   Filed Weights   03/29/14 2005 03/30/14 0131  Weight: 153.316 kg (338 lb) 153.316 kg (338 lb)     Exam:  General exam: Morbidly obese young male sitting comfortably in bed.  Respiratory system: Clear. No increased work of breathing. Cardiovascular system: S1 & S2 heard, RRR. No JVD, murmurs, gallops, clicks. Chronic trace bilateral leg edema.Telemetry: Sinus rhythm/wide QRS  Gastrointestinal system: Abdomen is nondistended, soft and nontender. Normal bowel sounds heard. Central nervous system: Alert and oriented. No focal neurological deficits. Extremities: Symmetric 5 x 5 power.   Data Reviewed: Basic Metabolic Panel:  Recent Labs Lab 03/24/14 1630 03/29/14 2145 03/30/14 0500  NA 125* 130* 136*  K 2.9* 2.5* 2.5*  CL 75* 76* 83*  CO2 35* 36* 36*  GLUCOSE 403* 182* 115*  BUN 29* 33* 31*  CREATININE 2.00* 1.20 1.22  CALCIUM 8.1* 9.1 8.7  MG 1.5  --  1.5   Liver Function Tests: No results found for this basename: AST, ALT, ALKPHOS, BILITOT, PROT, ALBUMIN,  in the last 168 hours No results found for this basename: LIPASE, AMYLASE,  in the last  168 hours No results found for this basename: AMMONIA,  in the last 168 hours CBC:  Recent Labs Lab 03/24/14 1630 03/29/14 2145 03/30/14 0500  WBC 14.4* 15.1* 13.4*  NEUTROABS 12.4* 12.1*  --   HGB 11.4* 13.4 11.6*  HCT 34.5* 38.8* 35.3*  MCV 75.7* 75.0* 76.1*  PLT 228 337 328   Cardiac Enzymes: No results found for  this basename: CKTOTAL, CKMB, CKMBINDEX, TROPONINI,  in the last 168 hours BNP (last 3 results)  Recent Labs  01/03/14 1248 01/11/14 1121  PROBNP 1164.0* 2156.0*   CBG:  Recent Labs Lab 03/30/14 0055 03/30/14 0812 03/30/14 1140  GLUCAP 104* 147* 255*    No results found for this or any previous visit (from the past 240 hour(s)).    Additional labs: 1. Digoxin level: 0.6     Studies: No results found.      Scheduled Meds: . atorvastatin  80 mg Oral Daily  . bumetanide  4 mg Oral BID  . carvedilol  3.125 mg Oral BID WC  . digoxin  0.125 mg Oral Daily  . hydrALAZINE  25 mg Oral 3 times per day  . insulin aspart  0-9 Units Subcutaneous TID WC  . insulin glargine  25 Units Subcutaneous QHS  . isosorbide mononitrate  30 mg Oral Daily  . piperacillin-tazobactam (ZOSYN)  IV  3.375 g Intravenous 3 times per day  . potassium chloride  40 mEq Oral Q4H  . sodium chloride  3 mL Intravenous Q12H  . sodium chloride  3 mL Intravenous Q12H  . spironolactone  50 mg Oral Daily  . vancomycin  2,000 mg Intravenous Q12H   Continuous Infusions: . milrinone 0.25 mcg/kg/min (03/30/14 1125)    Principal Problem:   Perirectal abscess Active Problems:   NICM (nonischemic cardiomyopathy)   Sleep apnea- he reports comliance with Bi Pap   HTN (hypertension)   Hypokalemia   Diabetes mellitus   Pre-operative cardiovascular examination    Time spent: 40 minutes    Elease EtienneAnand D Avelynn Sellin, MD, FACP, [facility]. [facility] Pager [phone]  If 7PM-7AM, please contact night-coverage [email or web address] Password [email or web address] 03/30/2014, 1:51 PM    LOS: 1 day

## 2014-03-30 NOTE — Progress Notes (Signed)
RT spoke with patient about time that he wanted to wear his CPAP on and the patient stated that he was able to put the CPAP on without any assistance and that he would wear it starting at 2300.  RT will continue to monitor.

## 2014-03-31 ENCOUNTER — Encounter (HOSPITAL_COMMUNITY)
Admission: EM | Disposition: A | Discharge status: Home / Self Care | Payer: Self-pay | Source: Home / Self Care | Attending: Internal Medicine

## 2014-03-31 ENCOUNTER — Inpatient Hospital Stay (HOSPITAL_COMMUNITY): Payer: Medicare Other | Admitting: Anesthesiology

## 2014-03-31 ENCOUNTER — Encounter (HOSPITAL_COMMUNITY): Payer: Medicare Other | Admitting: Anesthesiology

## 2014-03-31 DIAGNOSIS — I5022 Chronic systolic (congestive) heart failure: Secondary | ICD-10-CM

## 2014-03-31 HISTORY — PX: INCISION AND DRAINAGE ABSCESS: SHX5864

## 2014-03-31 LAB — COMPREHENSIVE METABOLIC PANEL
ALK PHOS: 98 U/L (ref 39–117)
ALT: 10 U/L (ref 0–53)
AST: 23 U/L (ref 0–37)
Albumin: 2.9 g/dL — ABNORMAL LOW (ref 3.5–5.2)
BUN: 30 mg/dL — ABNORMAL HIGH (ref 6–23)
CHLORIDE: 82 meq/L — AB (ref 96–112)
CO2: 32 meq/L (ref 19–32)
Calcium: 9.3 mg/dL (ref 8.4–10.5)
Creatinine, Ser: 1.25 mg/dL (ref 0.50–1.35)
GFR calc Af Amer: 85 mL/min — ABNORMAL LOW (ref >90)
GFR, EST NON AFRICAN AMERICAN: 73 mL/min — AB (ref >90)
GLUCOSE: 184 mg/dL — AB (ref 70–99)
POTASSIUM: 3.5 meq/L — AB (ref 3.7–5.3)
SODIUM: 132 meq/L — AB (ref 137–147)
Total Bilirubin: 0.8 mg/dL (ref 0.3–1.2)
Total Protein: 8.9 g/dL — ABNORMAL HIGH (ref 6.0–8.3)

## 2014-03-31 LAB — CBC
HCT: 38.9 % — ABNORMAL LOW (ref 39.0–52.0)
Hemoglobin: 13.1 g/dL (ref 13.0–17.0)
MCH: 25.3 pg — ABNORMAL LOW (ref 26.0–34.0)
MCHC: 33.7 g/dL (ref 30.0–36.0)
MCV: 75.1 fL — ABNORMAL LOW (ref 78.0–100.0)
Platelets: 396 10*3/uL (ref 150–400)
RBC: 5.18 MIL/uL (ref 4.22–5.81)
RDW: 17.6 % — AB (ref 11.5–15.5)
WBC: 11.6 10*3/uL — ABNORMAL HIGH (ref 4.0–10.5)

## 2014-03-31 LAB — GLUCOSE, CAPILLARY
GLUCOSE-CAPILLARY: 187 mg/dL — AB (ref 70–99)
GLUCOSE-CAPILLARY: 156 mg/dL — AB (ref 70–99)
Glucose-Capillary: 183 mg/dL — ABNORMAL HIGH (ref 70–99)
Glucose-Capillary: 142 mg/dL — ABNORMAL HIGH (ref 70–99)

## 2014-03-31 SURGERY — INCISION AND DRAINAGE, ABSCESS
Anesthesia: General

## 2014-03-31 MED ORDER — SUCCINYLCHOLINE CHLORIDE 20 MG/ML IJ SOLN
INTRAMUSCULAR | Status: DC | PRN
  Administered 2014-03-31: 100 mg via INTRAVENOUS

## 2014-03-31 MED ORDER — OXYCODONE HCL 5 MG PO TABS
5.0000 mg | ORAL_TABLET | Freq: Once | ORAL | Status: AC | PRN
  Administered 2014-03-31: 5 mg via ORAL

## 2014-03-31 MED ORDER — PROPOFOL 10 MG/ML IV BOLUS
INTRAVENOUS | Status: DC | PRN
  Administered 2014-03-31: 200 mg via INTRAVENOUS

## 2014-03-31 MED ORDER — OXYCODONE-ACETAMINOPHEN 5-325 MG PO TABS: 1.0000 | ORAL_TABLET | ORAL | Status: DC | PRN

## 2014-03-31 MED ORDER — 0.9 % SODIUM CHLORIDE (POUR BTL) OPTIME
TOPICAL | Status: DC | PRN
  Administered 2014-03-31: 1000 mL

## 2014-03-31 MED ORDER — HYDROMORPHONE HCL PF 1 MG/ML IJ SOLN
INTRAMUSCULAR | Status: AC
  Filled 2014-03-31: qty 1

## 2014-03-31 MED ORDER — MEPERIDINE HCL 25 MG/ML IJ SOLN: 6.2500 mg | INTRAMUSCULAR | Status: DC | PRN

## 2014-03-31 MED ORDER — FENTANYL CITRATE 0.05 MG/ML IJ SOLN: 50.0000 ug | INTRAMUSCULAR | Status: DC | PRN

## 2014-03-31 MED ORDER — SODIUM CHLORIDE 0.9 % IV SOLN
INTRAVENOUS | Status: DC | PRN
  Administered 2014-03-31: 21:00:00 via INTRAVENOUS

## 2014-03-31 MED ORDER — FENTANYL CITRATE 0.05 MG/ML IJ SOLN
INTRAMUSCULAR | Status: DC | PRN
Start: 2014-03-31 — End: 2014-03-31
  Administered 2014-03-31 (×2): 100 ug via INTRAVENOUS
  Administered 2014-03-31: 50 ug via INTRAVENOUS

## 2014-03-31 MED ORDER — PIPERACILLIN-TAZOBACTAM 3.375 G IVPB
3.3750 g | Freq: Three times a day (TID) | INTRAVENOUS | Status: DC
  Administered 2014-03-31 – 2014-04-01 (×3): 3.375 g via INTRAVENOUS
  Filled 2014-03-31 (×8): qty 50

## 2014-03-31 MED ORDER — POTASSIUM CHLORIDE CRYS ER 20 MEQ PO TBCR
40.0000 meq | EXTENDED_RELEASE_TABLET | Freq: Two times a day (BID) | ORAL | Status: DC
  Administered 2014-04-01: 40 meq via ORAL
  Filled 2014-03-31 (×2): qty 2

## 2014-03-31 MED ORDER — PROPOFOL 10 MG/ML IV BOLUS
INTRAVENOUS | Status: AC
  Filled 2014-03-31: qty 20

## 2014-03-31 MED ORDER — ALTEPLASE 2 MG IJ SOLR
2.0000 mg | Freq: Once | INTRAMUSCULAR | Status: AC
  Administered 2014-03-31: 2 mg
  Filled 2014-03-31: qty 2

## 2014-03-31 MED ORDER — MIDAZOLAM HCL 2 MG/2ML IJ SOLN
INTRAMUSCULAR | Status: AC
  Filled 2014-03-31: qty 2

## 2014-03-31 MED ORDER — PROMETHAZINE HCL 25 MG/ML IJ SOLN: 6.2500 mg | INTRAMUSCULAR | Status: DC | PRN

## 2014-03-31 MED ORDER — HYDRALAZINE HCL 25 MG PO TABS
25.0000 mg | ORAL_TABLET | Freq: Three times a day (TID) | ORAL | Status: DC
  Filled 2014-03-31 (×6): qty 1

## 2014-03-31 MED ORDER — ONDANSETRON HCL 4 MG/2ML IJ SOLN
INTRAMUSCULAR | Status: DC | PRN
  Administered 2014-03-31: 4 mg via INTRAVENOUS

## 2014-03-31 MED ORDER — LIDOCAINE HCL (CARDIAC) 20 MG/ML IV SOLN
INTRAVENOUS | Status: AC
  Filled 2014-03-31: qty 5

## 2014-03-31 MED ORDER — POTASSIUM CHLORIDE CRYS ER 20 MEQ PO TBCR: 40.0000 meq | EXTENDED_RELEASE_TABLET | Freq: Once | ORAL | Status: DC

## 2014-03-31 MED ORDER — HYDROMORPHONE HCL PF 1 MG/ML IJ SOLN
1.0000 mg | INTRAMUSCULAR | Status: DC | PRN
  Administered 2014-04-01: 1 mg via INTRAVENOUS
  Filled 2014-03-31 (×2): qty 1

## 2014-03-31 MED ORDER — POTASSIUM CHLORIDE CRYS ER 20 MEQ PO TBCR
40.0000 meq | EXTENDED_RELEASE_TABLET | Freq: Once | ORAL | Status: AC
  Administered 2014-03-31: 40 meq via ORAL

## 2014-03-31 MED ORDER — MIDAZOLAM HCL 2 MG/2ML IJ SOLN: 1.0000 mg | INTRAMUSCULAR | Status: DC | PRN

## 2014-03-31 MED ORDER — LIDOCAINE HCL (CARDIAC) 20 MG/ML IV SOLN
INTRAVENOUS | Status: DC | PRN
  Administered 2014-03-31: 30 mg via INTRAVENOUS

## 2014-03-31 MED ORDER — OXYCODONE HCL 5 MG PO TABS
ORAL_TABLET | ORAL | Status: AC
  Filled 2014-03-31: qty 1

## 2014-03-31 MED ORDER — MIDAZOLAM HCL 2 MG/2ML IJ SOLN
INTRAMUSCULAR | Status: DC | PRN
  Administered 2014-03-31: 2 mg via INTRAVENOUS

## 2014-03-31 MED ORDER — SUCCINYLCHOLINE CHLORIDE 20 MG/ML IJ SOLN
INTRAMUSCULAR | Status: AC
  Filled 2014-03-31: qty 1

## 2014-03-31 MED ORDER — ONDANSETRON HCL 4 MG/2ML IJ SOLN
INTRAMUSCULAR | Status: AC
  Filled 2014-03-31: qty 2

## 2014-03-31 MED ORDER — FENTANYL CITRATE 0.05 MG/ML IJ SOLN
INTRAMUSCULAR | Status: AC
  Filled 2014-03-31: qty 5

## 2014-03-31 MED ORDER — HYDROMORPHONE HCL PF 1 MG/ML IJ SOLN
0.5000 mg | INTRAMUSCULAR | Status: AC | PRN
  Administered 2014-03-31 (×2): 0.5 mg via INTRAVENOUS
  Filled 2014-03-31 (×2): qty 1

## 2014-03-31 MED ORDER — OXYCODONE HCL 5 MG/5ML PO SOLN: 5.0000 mg | Freq: Once | ORAL | Status: AC | PRN

## 2014-03-31 MED ORDER — HYDROMORPHONE HCL PF 1 MG/ML IJ SOLN
0.2500 mg | INTRAMUSCULAR | Status: DC | PRN
  Administered 2014-03-31 (×4): 0.5 mg via INTRAVENOUS

## 2014-03-31 SURGICAL SUPPLY — 37 items
BANDAGE GAUZE ELAST BULKY 4 IN (GAUZE/BANDAGES/DRESSINGS) IMPLANT
BRIEF STRETCH FOR OB PAD LRG (UNDERPADS AND DIAPERS) ×3 IMPLANT
CANISTER SUCTION 2500CC (MISCELLANEOUS) ×3 IMPLANT
COVER MAYO STAND STRL (DRAPES) ×3 IMPLANT
COVER SURGICAL LIGHT HANDLE (MISCELLANEOUS) ×3 IMPLANT
DRAIN PENROSE 1/2X12 LTX STRL (WOUND CARE) ×3 IMPLANT
DRAPE LAPAROSCOPIC ABDOMINAL (DRAPES) ×6 IMPLANT
DRAPE UTILITY 15X26 W/TAPE STR (DRAPE) ×6 IMPLANT
DRSG PAD ABDOMINAL 8X10 ST (GAUZE/BANDAGES/DRESSINGS) ×3 IMPLANT
ELECT CAUTERY BLADE 6.4 (BLADE) ×3 IMPLANT
ELECT REM PT RETURN 9FT ADLT (ELECTROSURGICAL) ×3
ELECTRODE REM PT RTRN 9FT ADLT (ELECTROSURGICAL) ×1 IMPLANT
GAUZE PACKING IODOFORM 1 (PACKING) ×3 IMPLANT
GLOVE BIO SURGEON STRL SZ7.5 (GLOVE) ×6 IMPLANT
GLOVE BIOGEL PI IND STRL 7.5 (GLOVE) ×1 IMPLANT
GLOVE BIOGEL PI IND STRL 8 (GLOVE) ×2 IMPLANT
GLOVE BIOGEL PI INDICATOR 7.5 (GLOVE) ×2
GLOVE BIOGEL PI INDICATOR 8 (GLOVE) ×4
GLOVE SURG SS PI 7.5 STRL IVOR (GLOVE) ×3 IMPLANT
GOWN STRL REUS W/ TWL LRG LVL3 (GOWN DISPOSABLE) ×2 IMPLANT
GOWN STRL REUS W/TWL LRG LVL3 (GOWN DISPOSABLE) ×4
KIT BASIN OR (CUSTOM PROCEDURE TRAY) ×3 IMPLANT
KIT ROOM TURNOVER OR (KITS) ×3 IMPLANT
LEGGING LITHOTOMY PAIR STRL (DRAPES) ×3 IMPLANT
NS IRRIG 1000ML POUR BTL (IV SOLUTION) ×3 IMPLANT
PACK GENERAL/GYN (CUSTOM PROCEDURE TRAY) ×3 IMPLANT
PAD ABD 8X10 STRL (GAUZE/BANDAGES/DRESSINGS) ×3 IMPLANT
PAD ARMBOARD 7.5X6 YLW CONV (MISCELLANEOUS) ×3 IMPLANT
SPONGE GAUZE 4X4 12PLY (GAUZE/BANDAGES/DRESSINGS) ×3 IMPLANT
SPONGE GAUZE 4X4 12PLY STER LF (GAUZE/BANDAGES/DRESSINGS) ×3 IMPLANT
SUT ETHILON 2 0 FS 18 (SUTURE) ×3 IMPLANT
SUT SILK 2 0 FS (SUTURE) ×3 IMPLANT
SWAB COLLECTION DEVICE MRSA (MISCELLANEOUS) ×3 IMPLANT
TOWEL OR 17X24 6PK STRL BLUE (TOWEL DISPOSABLE) ×3 IMPLANT
TOWEL OR 17X26 10 PK STRL BLUE (TOWEL DISPOSABLE) ×3 IMPLANT
TRAY FOLEY CATH 16FR SILVER (SET/KITS/TRAYS/PACK) ×3 IMPLANT
TUBE ANAEROBIC SPECIMEN COL (MISCELLANEOUS) ×3 IMPLANT

## 2014-03-31 NOTE — Transfer of Care (Signed)
Immediate Anesthesia Transfer of Care Note  Patient: Steven Wyatt.  Procedure(s) Performed: Procedure(s): INCISION AND DRAINAGE  PERI-RECTAL ABSCESS (N/A)  Patient Location: PACU  Anesthesia Type:General  Level of Consciousness: awake, alert  and oriented  Airway & Oxygen Therapy: Patient connected to face mask oxygen  Post-op Assessment: Report given to PACU RN, Post -op Vital signs reviewed and stable and Patient moving all extremities X 4  Post vital signs: Reviewed and stable  Complications: No apparent anesthesia complications

## 2014-03-31 NOTE — Progress Notes (Signed)
  Subjective: k better, but low, getting supplement. BP a bit low. Asymptomatic.    Objective: Vital signs in last 24 hours: Temp:  [97.7 F (36.5 C)-97.9 F (36.6 C)] 97.8 F (36.6 C) (04/10 0545) Pulse Rate:  [77-101] 101 (04/10 0818) Resp:  [18] 18 (04/10 0545) BP: (80-130)/(45-113) 92/71 mmHg (04/10 0818) SpO2:  [92 %-95 %] 95 % (04/10 0545) Weight:  [336 lb 11.2 oz (152.726 kg)] 336 lb 11.2 oz (152.726 kg) (04/10 0545) Last BM Date: 03/23/14  Intake/Output from previous day: 04/09 0701 - 04/10 0700 In: 1419.5 [P.O.:942; I.V.:137.5; IV Piggyback:100] Out: 4125 [Urine:4125] Intake/Output this shift: Total I/O In: -  Out: 550 [Urine:550]   PE General appearance: alert, cooperative and no distress Cardio: regular rate and rhythm, S1, S2 normal, no murmur, click, rub or gallop GI: soft, non-tender; bowel sounds normal; no masses,  no organomegaly Incision/Wound: left buttock, there is mild purulent drainage, fluctuant.    Lab Results:   Recent Labs  03/29/14 2145 03/30/14 0500  WBC 15.1* 13.4*  HGB 13.4 11.6*  HCT 38.8* 35.3*  PLT 337 328   BMET  Recent Labs  03/30/14 0500 03/30/14 2015  NA 136* 132*  K 2.5* 3.0*  CL 83* 81*  CO2 36* 33*  GLUCOSE 115* 283*  BUN 31* 33*  CREATININE 1.22 1.41*  CALCIUM 8.7 8.8   PT/INR No results found for this basename: LABPROT, INR,  in the last 72 hours ABG No results found for this basename: PHART, PCO2, PO2, HCO3,  in the last 72 hours  Studies/Results: No results found.  Anti-infectives: Anti-infectives   Start     Dose/Rate Route Frequency Ordered Stop   03/30/14 1600  vancomycin (VANCOCIN) 2,000 mg in sodium chloride 0.9 % 500 mL IVPB     2,000 mg 250 mL/hr over 120 Minutes Intravenous Every 12 hours 03/30/14 0324     03/30/14 0330  vancomycin (VANCOCIN) 2,500 mg in sodium chloride 0.9 % 500 mL IVPB     2,500 mg 250 mL/hr over 120 Minutes Intravenous  Once 03/30/14 0324 03/30/14 0630   03/30/14 0330   piperacillin-tazobactam (ZOSYN) IVPB 3.375 g     3.375 g 12.5 mL/hr over 240 Minutes Intravenous 3 times per day 03/30/14 0324     03/29/14 2045  clindamycin (CLEOCIN) IVPB 600 mg     600 mg 100 mL/hr over 30 Minutes Intravenous  Once 03/29/14 2037 03/29/14 2311      Assessment/Plan: Perirectal abscess -OR this AM for I&D -anticipate discharge home tomorrow, will need HH, wife will also help with dressing changes if packing is needed. Hypokalemia-supplement K   LOS: 2 days    Quandra Fedorchak ANP-BC 03/31/2014 8:45 AM

## 2014-03-31 NOTE — Anesthesia Procedure Notes (Signed)
Procedure Name: Intubation Date/Time: 03/31/2014 8:47 PM Performed by: Molli Hazard Pre-anesthesia Checklist: Patient identified, Emergency Drugs available, Suction available and Patient being monitored Patient Re-evaluated:Patient Re-evaluated prior to inductionOxygen Delivery Method: Circle system utilized Preoxygenation: Pre-oxygenation with 100% oxygen Intubation Type: IV induction Ventilation: Mask ventilation without difficulty Laryngoscope Size: Miller and 2 Grade View: Grade I Tube type: Oral Tube size: 7.5 mm Number of attempts: 1 Airway Equipment and Method: Stylet Placement Confirmation: ETT inserted through vocal cords under direct vision,  positive ETCO2 and breath sounds checked- equal and bilateral Secured at: 21 cm Tube secured with: Tape Dental Injury: Teeth and Oropharynx as per pre-operative assessment

## 2014-03-31 NOTE — Anesthesia Preprocedure Evaluation (Addendum)
Anesthesia Evaluation  Patient identified by MRN, date of birth, ID band Patient awake    Reviewed: Allergy & Precautions, H&P , NPO status , Patient's Chart, lab work & pertinent test results  Airway       Dental   Pulmonary asthma , sleep apnea ,          Cardiovascular hypertension, Pt. on medications +CHF  Non ischemic cardiomyopathy on chronic milrinone infusion.   Neuro/Psych negative neurological ROS  negative psych ROS   GI/Hepatic negative GI ROS, Neg liver ROS,   Endo/Other  diabetes, Type 2, Oral Hypoglycemic Agents, Insulin DependentMorbid obesity  Renal/GU Renal disease     Musculoskeletal negative musculoskeletal ROS (+)   Abdominal   Peds  Hematology  (+) anemia ,   Anesthesia Other Findings   Reproductive/Obstetrics negative OB ROS                        Anesthesia Physical Anesthesia Plan  ASA: IV  Anesthesia Plan: General   Post-op Pain Management:    Induction: Intravenous  Airway Management Planned: Oral ETT  Additional Equipment:   Intra-op Plan:   Post-operative Plan: Extubation in OR  Informed Consent: I have reviewed the patients History and Physical, chart, labs and discussed the procedure including the risks, benefits and alternatives for the proposed anesthesia with the patient or authorized representative who has indicated his/her understanding and acceptance.   Dental advisory given  Plan Discussed with: CRNA  Anesthesia Plan Comments:         Anesthesia Quick Evaluation

## 2014-03-31 NOTE — Progress Notes (Signed)
[facility] PROGRESS NOTE  [employer] GroupWayne O Shammas Jr. QMV:[id number] DOB: [birth year] DOA: 03/29/2014 PCP: Baltazar ApoHartman, Nancy, PA-C  HPI/Brief narrative  38 year old male with history of nonischemic cardiomyopathy, normal coronaries by angiography in October 2014, chronic systolic CHF, LVEF 20% by echo 12/2013, on chronic home milrinone via PIC, weight down 84 pounds since January, morbid obesity, OSA on nightly BiPAP, HTN,CKD, type II DM and HLD admitted on 03/30/14 secondary to worsening perirectal abscess despite outpatient management by surgery.   Assessment/Plan:  Perirectal abscess:  N.p.o. with plan for I&D by surgery today. Continue empiric vancomycin and Zosyn. Cardiology has seen patient and recommend any procedures to be done with local anesthesia and conscious sedation and not GA if at all possible. If needs GA, he will be at moderate to high risk for perioperative complications. Leukocytosis improving. Afebrile  Profound hypokalemia:  Secondary to diuresis. Aggressively replenished potassium and is 3.5 this morning. Magnesium of 1.9  Chronic systolic CHF/nonischemic cardiomyopathy Management per advanced heart failure team. Stable. Continue milrinone drip (patient chronically on it), digoxin, hydralazine and Imdur. Increased Aldactone to 50 mg daily.D. echo repeated which shows improvement in his LV function with EF of 35%. Cardiology plans ICD after infection has completely cleared.  Type II DM:  . Continue Lantus and SSI.   Hypertension:  Pressure currently stable.  OSA: Continue nightly BiPAP.  Morbid obesity:  Has lost 84 pounds since January.   Chronic kidney disease:  Creatinine improved this morning   Code Status: Full  Family Communication: wife  at bedside  Disposition Plan: Patient wants to go home tomorrow. Ok per primary team if cleared by surgery ( depending upon the I&D and need for deep packing)  Consultants:  Advanced heart failure team  General  surgery  Procedures:  I&D today   Antibiotics:  IV Zosyn 4/9 >  IV vancomycin 4/9 >    HPI/Subjective: Patient seen and examined this morning. Complains of pain in his buttocks. Afebrile. Npo for surgery today  Objective: Filed Vitals:   03/31/14 1320  BP: 110/72  Pulse: 103  Temp: 98 F (36.7 C)  Resp:     Intake/Output Summary (Last 24 hours) at 03/31/14 1338 Last data filed at 03/31/14 1319  Gross per 24 hour  Intake  977.5 ml  Output   4350 ml  Net -3372.5 ml   Filed Weights   03/29/14 2005 03/30/14 0131 03/31/14 0545  Weight: 153.316 kg (338 lb) 153.316 kg (338 lb) 152.726 kg (336 lb 11.2 oz)    Exam:   General: Middle aged obese male lying in bed in no acute distress  HEENT: No pallor, moist oral mucosa  Chest: Clear to auscultation bilaterally, no added sounds  CVS: Normal S1 and S2, systolic murmur 2-6, no murmurs rub or gallop  Abdomen: Soft, nontender, nondistended, bowel sounds present, erythema with underlying tender fluctuant abscess over the right gluteus  Extremities: Warm, no edema  CNS: AAO x3  Data Reviewed: Basic Metabolic Panel:  Recent Labs Lab 03/24/14 1630 03/29/14 2145 03/30/14 0500 03/30/14 2015 03/31/14 1140  NA 125* 130* 136* 132* 132*  K 2.9* 2.5* 2.5* 3.0* 3.5*  CL 75* 76* 83* 81* 82*  CO2 35* 36* 36* 33* 32  GLUCOSE 403* 182* 115* 283* 184*  BUN 29* 33* 31* 33* 30*  CREATININE 2.00* 1.20 1.22 1.41* 1.25  CALCIUM 8.1* 9.1 8.7 8.8 9.3  MG 1.5  --  1.5 1.9  --    Liver Function Tests:  Recent Labs Lab  03/31/14 1140  AST 23  ALT 10  ALKPHOS 98  BILITOT 0.8  PROT 8.9*  ALBUMIN 2.9*   No results found for this basename: LIPASE, AMYLASE,  in the last 168 hours No results found for this basename: AMMONIA,  in the last 168 hours CBC:  Recent Labs Lab 03/24/14 1630 03/29/14 2145 03/30/14 0500 03/31/14 1140  WBC 14.4* 15.1* 13.4* 11.6*  NEUTROABS 12.4* 12.1*  --   --   HGB 11.4* 13.4 11.6* 13.1  HCT  34.5* 38.8* 35.3* 38.9*  MCV 75.7* 75.0* 76.1* 75.1*  PLT 228 337 328 396   Cardiac Enzymes: No results found for this basename: CKTOTAL, CKMB, CKMBINDEX, TROPONINI,  in the last 168 hours BNP (last 3 results)  Recent Labs  01/03/14 1248 01/11/14 1121  PROBNP 1164.0* 2156.0*   CBG:  Recent Labs Lab 03/30/14 1140 03/30/14 1625 03/30/14 2118 03/31/14 0727 03/31/14 1158  GLUCAP 255* 270* 272* 183* 187*    No results found for this or any previous visit (from the past 240 hour(s)).   Studies: No results found.  Scheduled Meds: . atorvastatin  80 mg Oral Daily  . bumetanide  4 mg Oral BID  . carvedilol  3.125 mg Oral BID WC  . digoxin  0.125 mg Oral Daily  . hydrALAZINE  25 mg Oral 3 times per day  . insulin aspart  0-9 Units Subcutaneous TID WC  . insulin glargine  25 Units Subcutaneous QHS  . isosorbide mononitrate  30 mg Oral Daily  . piperacillin-tazobactam (ZOSYN)  IV  3.375 g Intravenous Q8H  . potassium chloride  40 mEq Oral BID  . sodium chloride  3 mL Intravenous Q12H  . sodium chloride  3 mL Intravenous Q12H  . spironolactone  50 mg Oral Daily  . vancomycin  2,000 mg Intravenous Q12H   Continuous Infusions: . milrinone 0.25 mcg/kg/min (03/31/14 1110)      Time spent: 25 minutes    Janeisha Ryle  [facility] Pager [phone]. If 7PM-7AM, please contact night-coverage at [email or web address], password [facility] 03/31/2014, 1:38 PM  LOS: 2 days

## 2014-03-31 NOTE — Progress Notes (Signed)
Pt B/P 97/67, Hydralazine held, Md paged for parameters.

## 2014-03-31 NOTE — Progress Notes (Signed)
[facility]  Patient Status: Active (receiving services up to time of hospitalization)  [facility] is providing the following services: RN and Home Infusion Services (teaching and education will be done by nurse in the home with patient and caregiver) IV Milrinone at home  If patient discharges after hours, please call [phone].   Steven Wyatt 03/31/2014, 3:39 PM

## 2014-03-31 NOTE — Op Note (Signed)
Pre Operative Diagnosis:  Left perirectal abscess  Post Operative Diagnosis: same  Procedure: I&D of left perirectal abscess  Surgeon: Dr. Axel Filler  Assistant: none  Anesthesia: GETA  EBL: 20 cc  Complications: none  Counts: reported as correct x 2  Findings:  Patient had a communicating  abscess just inferior his previous incision and drainage. This was connected bluntly and a Penrose drain as well as packing was placed in this wound.  Indications for procedure:  Patient is a 38 year old male with recurrent left perirectal abscess. Patient was brought to OR for definitive I&D.  Details of the procedure:The patient was taken back to the operating room. The patient was placed in lithotomyposition with bilateral SCDs in place. After appropriate anitbiotics were confirmed, a time-out was confirmed and all facts were verified.  A 10 blade was used to incise the area of greatest fluctuance. This was bluntly dissected and all loculations were broken up. There was a tract that led to the more anterior previous I&D. These were connected bluntly. A one-inch Penrose drain was used connected to the incisions. At this time a one-inch for packing was in place and the posterior wound to help with hemostasis. The patient tolerated the procedure well.  The patient was taken to the recovery room in stable condition.

## 2014-03-31 NOTE — Progress Notes (Signed)
To the OR today for I&D of left glueal and perirectal abscess.  Will place patient in lithotomy position.  Marta Lamas. Gae Bon, MD, FACS [phone] [facility] Surgery

## 2014-03-31 NOTE — Progress Notes (Signed)
Advanced Heart Failure Rounding Note   Subjective:    [name] is a 38 yo male with a history of NICM (nor cors Oct 2014), chronic systolic HF ECHO 1/47821/2015 EF 20% on chronic home milrinone via PICC, morbid obesity, OSA on BIPAP, HTN, CKD uncontrolled DM, and HLD. He has been followed closely in HF clinic and he was last seen 03/20/14 and Milrinone was cut back 0.25 mcg. Weight at that time was 339 pounds on bumex 4 mg twice a day and spiro 12.5 mg daily.   [facility] with worsening perirectal abscess despite outpatient management by general surgery. . Started on vancomycin and zosyn. Plan for I and D today.   Denies SOB.   ECHO 03/30/14 EF 35%  CO-OX 44%   Objective:   Weight Range:  Vital Signs:   Temp:  [97.7 F (36.5 C)-97.9 F (36.6 C)] 97.8 F (36.6 C) (04/10 0545) Pulse Rate:  [77-101] 77 (04/10 0545) Resp:  [18] 18 (04/10 0545) BP: (80-130)/(45-113) 97/67 mmHg (04/10 0650) SpO2:  [92 %-95 %] 95 % (04/10 0545) Weight:  [336 lb 11.2 oz (152.726 kg)] 336 lb 11.2 oz (152.726 kg) (04/10 0545) Last BM Date: 03/23/14  Weight change: Filed Weights   03/29/14 2005 03/30/14 0131 03/31/14 0545  Weight: 338 lb (153.316 kg) 338 lb (153.316 kg) 336 lb 11.2 oz (152.726 kg)    Intake/Output:   Intake/Output Summary (Last 24 hours) at 03/31/14 0741 Last data filed at 03/31/14 0545  Gross per 24 hour  Intake 1419.5 ml  Output   4125 ml  Net -2705.5 ml    Physical Exam:  General: lying in bed on Bipap d.  HEENT: normal  Neck: supple. JVP difficult to assess due to body habitus. Carotids 2+ bilat; no bruits. No lymphadenopathy or thryomegaly appreciated.  Cor: PMI nondisplaced. Tachy , Regular rate & rhythm. No rubs, gallops or murmurs.  Lungs: clear  Abdomen:obese, soft, nontender, nondistended. No hepatosplenomegaly. No bruits or masses. Good bowel sounds.  Extremities: no cyanosis, clubbing, rash, edema RUE PICC  Neuro: alert & orientedx3, cranial nerves grossly  intact. moves all 4 extremities w/o difficulty. Affect pleasant  Telemetry: ST 100s    Labs: Basic Metabolic Panel:  Recent Labs Lab 03/24/14 1630 03/29/14 2145 03/30/14 0500 03/30/14 2015  NA 125* 130* 136* 132*  K 2.9* 2.5* 2.5* 3.0*  CL 75* 76* 83* 81*  CO2 35* 36* 36* 33*  GLUCOSE 403* 182* 115* 283*  BUN 29* 33* 31* 33*  CREATININE 2.00* 1.20 1.22 1.41*  CALCIUM 8.1* 9.1 8.7 8.8  MG 1.5  --  1.5 1.9    Liver Function Tests: No results found for this basename: AST, ALT, ALKPHOS, BILITOT, PROT, ALBUMIN,  in the last 168 hours No results found for this basename: LIPASE, AMYLASE,  in the last 168 hours No results found for this basename: AMMONIA,  in the last 168 hours  CBC:  Recent Labs Lab 03/24/14 1630 03/29/14 2145 03/30/14 0500  WBC 14.4* 15.1* 13.4*  NEUTROABS 12.4* 12.1*  --   HGB 11.4* 13.4 11.6*  HCT 34.5* 38.8* 35.3*  MCV 75.7* 75.0* 76.1*  PLT 228 337 328    Cardiac Enzymes: No results found for this basename: CKTOTAL, CKMB, CKMBINDEX, TROPONINI,  in the last 168 hours  BNP: BNP (last 3 results)  Recent Labs  01/03/14 1248 01/11/14 1121  PROBNP 1164.0* 2156.0*     Other results:  EKG:   Imaging:  No results found.  Medications:     Scheduled Medications: . atorvastatin  80 mg Oral Daily  . bumetanide  4 mg Oral BID  . carvedilol  3.125 mg Oral BID WC  . digoxin  0.125 mg Oral Daily  . hydrALAZINE  25 mg Oral 3 times per day  . insulin aspart  0-9 Units Subcutaneous TID WC  . insulin glargine  25 Units Subcutaneous QHS  . isosorbide mononitrate  30 mg Oral Daily  . piperacillin-tazobactam (ZOSYN)  IV  3.375 g Intravenous 3 times per day  . potassium chloride  40 mEq Oral Once  . sodium chloride  3 mL Intravenous Q12H  . sodium chloride  3 mL Intravenous Q12H  . spironolactone  50 mg Oral Daily  . vancomycin  2,000 mg Intravenous Q12H     Infusions: . milrinone 0.25 mcg/kg/min (03/31/14 0222)     PRN  Medications:  acetaminophen, acetaminophen, albuterol, HYDROcodone-acetaminophen, HYDROmorphone (DILAUDID) injection, ondansetron (ZOFRAN) IV, ondansetron, sodium chloride   Assessment:   1. Perirectal Abscess  2. Chronic Systolic Heart Failure- on home milrinone 0.25 mcg  3. OAS- on BiPap  4. DM  5. Morbid Obesity  6. Hypokalemia    Plan/Discussion:    CO-OX 44% will repeat this am. If low will need to increase milrinone to 0.375 mcg. Continue current HF meds. Volume status stable.   Length of Stay: 2   Amy D Clegg 03/31/2014, 7:41 AM  Advanced Heart Failure Team Pager [phone] (M-F; 7a - 4p)  Please contact [facility] for night-coverage after hours (4p -7a ) and weekends on [email or web address]   Patient seen and examined with Tonye Becket, NP. We discussed all aspects of the encounter. I agree with the assessment and plan as stated above.   Stable from HF perspective. EF improved by echo on milrinone but co-ox remains low (suggesting low cardiac output). Will repeat. For I&D today of peri-rectal abscess. Would do under conscious sedation and local, if possible. Will supp K+  Daniel R Bensimhon,MD 8:09 AM

## 2014-04-01 DIAGNOSIS — I5023 Acute on chronic systolic (congestive) heart failure: Secondary | ICD-10-CM

## 2014-04-01 DIAGNOSIS — I509 Heart failure, unspecified: Secondary | ICD-10-CM

## 2014-04-01 DIAGNOSIS — N183 Chronic kidney disease, stage 3 unspecified: Secondary | ICD-10-CM

## 2014-04-01 LAB — BASIC METABOLIC PANEL
BUN: 31 mg/dL — ABNORMAL HIGH (ref 6–23)
CALCIUM: 8.9 mg/dL (ref 8.4–10.5)
CHLORIDE: 86 meq/L — AB (ref 96–112)
CO2: 32 mEq/L (ref 19–32)
CREATININE: 1.72 mg/dL — AB (ref 0.50–1.35)
GFR calc Af Amer: 58 mL/min — ABNORMAL LOW (ref >90)
GFR calc non Af Amer: 50 mL/min — ABNORMAL LOW (ref >90)
GLUCOSE: 134 mg/dL — AB (ref 70–99)
Potassium: 3.2 mEq/L — ABNORMAL LOW (ref 3.7–5.3)
SODIUM: 134 meq/L — AB (ref 137–147)

## 2014-04-01 LAB — CARBOXYHEMOGLOBIN
Carboxyhemoglobin: 1.5 % (ref 0.5–1.5)
Methemoglobin: 0.8 % (ref 0.0–1.5)
O2 Saturation: 76.5 %
TOTAL HEMOGLOBIN: 11.8 g/dL — AB (ref 13.5–18.0)

## 2014-04-01 LAB — CBC
HEMATOCRIT: 35.7 % — AB (ref 39.0–52.0)
Hemoglobin: 11.7 g/dL — ABNORMAL LOW (ref 13.0–17.0)
MCH: 25.2 pg — ABNORMAL LOW (ref 26.0–34.0)
MCHC: 32.8 g/dL (ref 30.0–36.0)
MCV: 76.8 fL — AB (ref 78.0–100.0)
PLATELETS: 381 10*3/uL (ref 150–400)
RBC: 4.65 MIL/uL (ref 4.22–5.81)
RDW: 17.8 % — ABNORMAL HIGH (ref 11.5–15.5)
WBC: 12.6 10*3/uL — ABNORMAL HIGH (ref 4.0–10.5)

## 2014-04-01 LAB — GLUCOSE, CAPILLARY
GLUCOSE-CAPILLARY: 296 mg/dL — AB (ref 70–99)
Glucose-Capillary: 165 mg/dL — ABNORMAL HIGH (ref 70–99)

## 2014-04-01 MED ORDER — HYDROCODONE-ACETAMINOPHEN 5-325 MG PO TABS: 2.0000 | ORAL_TABLET | ORAL | Status: DC | PRN

## 2014-04-01 MED ORDER — SPIRONOLACTONE 25 MG PO TABS: 25.0000 mg | ORAL_TABLET | Freq: Every day | ORAL | Status: DC

## 2014-04-01 MED ORDER — HEPARIN SOD (PORK) LOCK FLUSH 100 UNIT/ML IV SOLN: 250.0000 [IU] | INTRAVENOUS | Status: DC | PRN

## 2014-04-01 MED ORDER — DOXYCYCLINE HYCLATE 100 MG PO TABS: 100.0000 mg | ORAL_TABLET | Freq: Two times a day (BID) | ORAL | Status: DC

## 2014-04-01 NOTE — Progress Notes (Signed)
Patient demanding that his foley catheter be removed, stating "it is not negotiable."  Attempted to educate patient re: need for foley post surgery to help protect surgical site and prevent infection.  Patient stating the foley is uncomfortable and he cannot tolerate it any longer.  Foley removed per patient request without difficulty.  Will continue to monitor.  Steven Wyatt

## 2014-04-01 NOTE — Progress Notes (Signed)
Pt not wanting Cpap at this time,states he will put it on hisself when he is ready.

## 2014-04-01 NOTE — Progress Notes (Signed)
Patient ID: Steven Wyatt., male   DOB: [birth year], 38 y.o.   MRN: [id number] 1 Day Post-Op  Subjective: Pt feels well today.  Pain much better.  Eager to go home.  Objective: Vital signs in last 24 hours: Temp:  [97.8 F (36.6 C)-98.2 F (36.8 C)] 98.2 F (36.8 C) (04/11 0500) Pulse Rate:  [43-116] 106 (04/11 0953) Resp:  [16-25] 18 (04/11 0500) BP: (83-129)/(54-90) 111/75 mmHg (04/11 0900) SpO2:  [92 %-100 %] 100 % (04/11 0500) Weight:  [330 lb 3.2 oz (149.778 kg)] 330 lb 3.2 oz (149.778 kg) (04/11 0500) Last BM Date: 03/23/14  Intake/Output from previous day: 04/10 0701 - 04/11 0700 In: 500 [I.V.:500] Out: 2600 [Urine:2600] Intake/Output this shift:    PE: Skin: left gluteus still with induration, but otherwise wound is clean.  Packing removed, but penrose left in place. Minimal pain  Lab Results:   Recent Labs  03/31/14 1140 04/01/14 0615  WBC 11.6* 12.6*  HGB 13.1 11.7*  HCT 38.9* 35.7*  PLT 396 381   BMET  Recent Labs  03/31/14 1140 04/01/14 0615  NA 132* 134*  K 3.5* 3.2*  CL 82* 86*  CO2 32 32  GLUCOSE 184* 134*  BUN 30* 31*  CREATININE 1.25 1.72*  CALCIUM 9.3 8.9   PT/INR No results found for this basename: LABPROT, INR,  in the last 72 hours CMP     Component Value Date/Time   NA 134* 04/01/2014 0615   K 3.2* 04/01/2014 0615   CL 86* 04/01/2014 0615   CO2 32 04/01/2014 0615   GLUCOSE 134* 04/01/2014 0615   BUN 31* 04/01/2014 0615   CREATININE 1.72* 04/01/2014 0615   CREATININE 2.00* 03/24/2014 1630   CALCIUM 8.9 04/01/2014 0615   PROT 8.9* 03/31/2014 1140   ALBUMIN 2.9* 03/31/2014 1140   AST 23 03/31/2014 1140   ALT 10 03/31/2014 1140   ALKPHOS 98 03/31/2014 1140   BILITOT 0.8 03/31/2014 1140   GFRNONAA 50* 04/01/2014 0615   GFRAA 58* 04/01/2014 0615   Lipase  No results found for this basename: lipase       Studies/Results: No results found.  Anti-infectives: Anti-infectives   Start     Dose/Rate Route Frequency Ordered Stop   03/31/14 1000  piperacillin-tazobactam (ZOSYN) IVPB 3.375 g     3.375 g 12.5 mL/hr over 240 Minutes Intravenous Every 8 hours 03/31/14 1003     03/30/14 1600  vancomycin (VANCOCIN) 2,000 mg in sodium chloride 0.9 % 500 mL IVPB     2,000 mg 250 mL/hr over 120 Minutes Intravenous Every 12 hours 03/30/14 0324     03/30/14 0330  vancomycin (VANCOCIN) 2,500 mg in sodium chloride 0.9 % 500 mL IVPB     2,500 mg 250 mL/hr over 120 Minutes Intravenous  Once 03/30/14 0324 03/30/14 0630   03/30/14 0330  piperacillin-tazobactam (ZOSYN) IVPB 3.375 g  Status:  Discontinued     3.375 g 12.5 mL/hr over 240 Minutes Intravenous 3 times per day 03/30/14 0324 03/31/14 1003   03/29/14 2045  clindamycin (CLEOCIN) IVPB 600 mg     600 mg 100 mL/hr over 30 Minutes Intravenous  Once 03/29/14 2037 03/29/14 2311       Assessment/Plan  1. POD 1, s/p I&D of large perirectal abscess  Plan: 1. Ok for [employer] home from our standpoint.  No packing required.  Will leave penrose drain in place.  He should do TID sitz bathes and SB after each BM.  I have d/w  the patient.  He does not need HH RN for this problem. 2. Follow up being arranged by our office.  They will call him at home for further appointment times. 3. Would recommend 7 days of doxycycline at home.   LOS: 3 days    Steven CapeKelly E Obediah Wyatt 04/01/2014, 11:53 AM Pager: [phone]

## 2014-04-01 NOTE — Progress Notes (Signed)
Advanced Heart Failure Rounding Note   Subjective:    [name] is a 38 yo male with a history of NICM (nor cors Oct 2014), chronic systolic HF ECHO 8/11911/2015 EF 20% on chronic home milrinone via PICC, morbid obesity, OSA on BIPAP, HTN, CKD uncontrolled DM, and HLD. He has been followed closely in HF clinic and he was last seen 03/20/14 and Milrinone was cut back 0.25 mcg. Weight at that time was 339 pounds on bumex 4 mg twice a day and spiro 12.5 mg daily.   [facility] with worsening perirectal abscess despite outpatient management by general surgery. . Started on vancomycin and zosyn. Now s/p I &D. Feels great. No dyspnea. Cr up to 1.7  Denies SOB.   ECHO 03/30/14 EF 35%  CO-OX 44%->76%   Objective:   Weight Range:  Vital Signs:   Temp:  [97.8 F (36.6 C)-98.2 F (36.8 C)] 98.2 F (36.8 C) (04/11 0500) Pulse Rate:  [43-116] 106 (04/11 0953) Resp:  [16-25] 18 (04/11 0500) BP: (83-129)/(54-90) 111/75 mmHg (04/11 0900) SpO2:  [92 %-100 %] 100 % (04/11 0500) Weight:  [149.778 kg (330 lb 3.2 oz)] 149.778 kg (330 lb 3.2 oz) (04/11 0500) Last BM Date: 03/23/14  Weight change: Filed Weights   03/30/14 0131 03/31/14 0545 04/01/14 0500  Weight: 153.316 kg (338 lb) 152.726 kg (336 lb 11.2 oz) 149.778 kg (330 lb 3.2 oz)    Intake/Output:   Intake/Output Summary (Last 24 hours) at 04/01/14 1355 Last data filed at 04/01/14 0100  Gross per 24 hour  Intake    500 ml  Output    950 ml  Net   -450 ml    Physical Exam:  General: lying in bed on Bipap d.  HEENT: normal  Neck: supple. JVP difficult to assess due to body habitus. Carotids 2+ bilat; no bruits. No lymphadenopathy or thryomegaly appreciated.  Cor: PMI nondisplaced. Tachy , Regular rate & rhythm. No rubs, gallops or murmurs.  Lungs: clear  Abdomen:obese, soft, nontender, nondistended. No hepatosplenomegaly. No bruits or masses. Good bowel sounds.  Extremities: no cyanosis, clubbing, rash, edema RUE PICC  Neuro: alert  & orientedx3, cranial nerves grossly intact. moves all 4 extremities w/o difficulty. Affect pleasant  Telemetry: ST 100s    Labs: Basic Metabolic Panel:  Recent Labs Lab 03/29/14 2145 03/30/14 0500 03/30/14 2015 03/31/14 1140 04/01/14 0615  NA 130* 136* 132* 132* 134*  K 2.5* 2.5* 3.0* 3.5* 3.2*  CL 76* 83* 81* 82* 86*  CO2 36* 36* 33* 32 32  GLUCOSE 182* 115* 283* 184* 134*  BUN 33* 31* 33* 30* 31*  CREATININE 1.20 1.22 1.41* 1.25 1.72*  CALCIUM 9.1 8.7 8.8 9.3 8.9  MG  --  1.5 1.9  --   --     Liver Function Tests:  Recent Labs Lab 03/31/14 1140  AST 23  ALT 10  ALKPHOS 98  BILITOT 0.8  PROT 8.9*  ALBUMIN 2.9*   No results found for this basename: LIPASE, AMYLASE,  in the last 168 hours No results found for this basename: AMMONIA,  in the last 168 hours  CBC:  Recent Labs Lab 03/29/14 2145 03/30/14 0500 03/31/14 1140 04/01/14 0615  WBC 15.1* 13.4* 11.6* 12.6*  NEUTROABS 12.1*  --   --   --   HGB 13.4 11.6* 13.1 11.7*  HCT 38.8* 35.3* 38.9* 35.7*  MCV 75.0* 76.1* 75.1* 76.8*  PLT 337 328 396 381    Cardiac Enzymes: No  results found for this basename: CKTOTAL, CKMB, CKMBINDEX, TROPONINI,  in the last 168 hours  BNP: BNP (last 3 results)  Recent Labs  01/03/14 1248 01/11/14 1121  PROBNP 1164.0* 2156.0*     Other results:  EKG:   Imaging: No results found.   Medications:     Scheduled Medications: . atorvastatin  80 mg Oral Daily  . bumetanide  4 mg Oral BID  . carvedilol  3.125 mg Oral BID WC  . digoxin  0.125 mg Oral Daily  . hydrALAZINE  25 mg Oral 3 times per day  . insulin aspart  0-9 Units Subcutaneous TID WC  . insulin glargine  25 Units Subcutaneous QHS  . isosorbide mononitrate  30 mg Oral Daily  . piperacillin-tazobactam (ZOSYN)  IV  3.375 g Intravenous Q8H  . potassium chloride  40 mEq Oral BID  . sodium chloride  3 mL Intravenous Q12H  . sodium chloride  3 mL Intravenous Q12H  . spironolactone  50 mg Oral Daily   . vancomycin  2,000 mg Intravenous Q12H    Infusions: . milrinone 0.25 mcg/kg/min (04/01/14 1100)    PRN Medications: acetaminophen, acetaminophen, albuterol, fentaNYL, HYDROcodone-acetaminophen, HYDROmorphone (DILAUDID) injection, midazolam, ondansetron (ZOFRAN) IV, ondansetron, oxyCODONE-acetaminophen, sodium chloride   Assessment:   1. Perirectal Abscess  2. Chronic Systolic Heart Failure- on home milrinone 0.25 mcg  3. OAS- on BiPap  4. DM  5. Morbid Obesity  6. Hypokalemia 7. Acute on chronic renal failure  Doing well. Repeat co-ox much improved. Looks a little dry. Ok fo d/c. Would hold bumex for 1 day and restart. Cut spiro to 25 daily. Will f/u with BMET this week.   ICD once abscess completely healed.    Bevelyn Buckles Robertt Buda,MD 1:55 PM

## 2014-04-01 NOTE — Anesthesia Postprocedure Evaluation (Signed)
Anesthesia Post Note  Patient: Steven Wyatt.  Procedure(s) Performed: Procedure(s) (LRB): INCISION AND DRAINAGE  PERI-RECTAL ABSCESS (N/A)  Anesthesia type: General  Patient location: PACU  Post pain: Pain level controlled  Post assessment: Post-op Vital signs reviewed  Last Vitals: BP 110/58  Pulse 106  Temp(Src) 36.6 C (Oral)  Resp 18  Ht 5\' 5"  (1.651 m)  Wt 336 lb 11.2 oz (152.726 kg)  BMI 56.03 kg/m2  SpO2 95%  Post vital signs: Reviewed  Level of consciousness: sedated  Complications: No apparent anesthesia complications

## 2014-04-01 NOTE — Discharge Instructions (Signed)
Perform Sitz Bath 3x a day and after each bowel movement.  Dry dressing over wound for drainage Sitz Bath A sitz bath is a warm water bath taken in the sitting position that covers only the hips and buttocks. It may be used for either healing or hygiene purposes. Sitz baths are also used to relieve pain, itching, or muscle spasms. The water may contain medicine. Moist heat will help you heal and relax.  HOME CARE INSTRUCTIONS  Take 3 to 4 sitz baths a day. 1. Fill the bathtub half full with warm water. 2. Sit in the water and open the drain a little. 3. Turn on the warm water to keep the tub half full. Keep the water running constantly. 4. Soak in the water for 15 to 20 minutes. 5. After the sitz bath, pat the affected area dry first. SEEK MEDICAL CARE IF:  You get worse instead of better. Stop the sitz baths if you get worse. MAKE SURE YOU:  Understand these instructions.  Will watch your condition.  Will get help right away if you are not doing well or get worse. Document Released: 08/30/2004 Document Revised: 09/01/2012 Document Reviewed: 03/07/2011 [facility] Patient Information 2014 [place], [place].

## 2014-04-01 NOTE — Discharge Summary (Addendum)
Physician Discharge Summary  Steven Wyatt [facility] ZOX:[id number] DOB: 26-May-1976 DOA: 03/29/2014  PCP: Baltazar Apo, PA-C  Admit date: 03/29/2014 Discharge date: 04/01/2014  Time spent: 40 minutes  Recommendations for Outpatient Follow-up:  1. Home with outpt surgery follow up. Resume HH. 2. PCP to CBC and BMET ( renal fn ) in 1 week   Discharge Diagnoses:  Principal Problem:   Perirectal abscess  Active Problems:   NICM (nonischemic cardiomyopathy)   Morbid obesity with BMI of 50.0-59.9, adult   Sleep apnea- he reports comliance with Bi Pap   HTN (hypertension)   Hypokalemia   Diabetes mellitus   Discharge Condition: FAIR  Diet recommendation: Diabetic  Filed Weights   03/30/14 0131 03/31/14 0545 04/01/14 0500  Weight: 153.316 kg (338 lb) 152.726 kg (336 lb 11.2 oz) 149.778 kg (330 lb 3.2 oz)    History of present illness:  please refer to admission H&P for details, but inbrief, 38 year old male with history of nonischemic cardiomyopathy, normal coronaries by angiography in October 2014, chronic systolic CHF, LVEF 20% by echo 12/2013, on chronic home milrinone via PIC, weight down 84 pounds since January, morbid obesity, OSA on nightly BiPAP, HTN,CKD, type II DM and HLD admitted on 03/30/14 secondary to worsening perirectal abscess despite outpatient management by surgery.    Hospital Course:  Perirectal abscess:  I&D done by surgery on 4/10. No packing required . Penrose drain left in place. Patient started on Continue empiric vancomycin and Zosyn. Will discharge on oral doxycycline for one week. Patient seen by cardiology for cardiac clearance for surgery. Has mild leukocytosis today but afebrile and symptoms improved.  Profound hypokalemia:  Secondary to diuresis. Aggressively replenished potassium and is improving. Continue home dose of potassium  Chronic systolic CHF/nonischemic cardiomyopathy  Management per advanced heart failure team. Stable. Continue milrinone drip  (patient chronically on it), digoxin, hydralazine and Imdur. Increased Aldactone to 25 mg daily  Upon discharge... echo repeated which shows improvement in his LV function with EF of 35%.  Cardiology plans ICD after infection has completely cleared.   Type II DM:  . Continue home dose Lantus  Hypertension:   currently stable.   OSA:  Continue nightly BiPAP.   Morbid obesity:  Has lost 84 pounds since January.   Chronic kidney disease:  Creatinine worsened to 1.7 this morning. Needs to be monitored as outpatient in one week.    Code Status: Full  Family Communication: None at bedside  Disposition Plan: Discharge home with outpatient followup with PCP and surgeon in one week. Was discharged on one-week course of oral doxycycline.  Consultants:  Advanced heart failure team  General surgery   Procedures:  I&D of perirectal abscess on 4/10  Antibiotics:  IV Zosyn 4/9 > 4/11 IV vancomycin 4/9 > 4/11    Discharge Exam: Filed Vitals:   04/01/14 0953  BP:   Pulse: 106  Temp:   Resp:     General: Middle aged obese male lying in bed in no acute distress  HEENT: No pallor, moist oral mucosa  Chest: Clear to auscultation bilaterally, no added sounds  CVS: Normal S1 and S2, systolic murmur 2-6, no murmurs rub or gallop  Abdomen: Soft, nontender, nondistended, bowel sounds present, swelling over right gluteus s/p I&D with penrose drain.  Extremities: Warm, no edema  CNS: AAO x3   Discharge Instructions You were cared for by a hospitalist during your hospital stay. If you have any questions about your discharge medications or the care you received  while you were in the hospital after you are discharged, you can call the unit and asked to speak with the hospitalist on call if the hospitalist that took care of you is not available. Once you are discharged, your primary care physician will handle any further medical issues. Please note that NO REFILLS for any discharge medications  will be authorized once you are discharged, as it is imperative that you return to your primary care physician (or establish a relationship with a primary care physician if you do not have one) for your aftercare needs so that they can reassess your need for medications and monitor your lab values.   Future Appointments Provider Department Dept Phone   04/07/2014 3:15 PM Romero BellingSean Ellison, MD [facility] [phone]   04/11/2014 10:40 AM [facility] [phone]   04/20/2014 11:00 AM Baltazar ApoNancy Hartman, PA-C [facility] [phone]       Medication List    STOP taking these medications       amoxicillin-clavulanate 875-125 MG per tablet  Commonly known as:  AUGMENTIN      TAKE these medications       acetaminophen 325 MG tablet  Commonly known as:  TYLENOL  Take 325 mg by mouth every 6 (six) hours as needed for mild pain.     albuterol 108 (90 BASE) MCG/ACT inhaler  Commonly known as:  PROVENTIL HFA;VENTOLIN HFA  Inhale 2 puffs into the lungs daily as needed for wheezing or shortness of breath.     atorvastatin 80 MG tablet  Commonly known as:  LIPITOR  Take 80 mg by mouth daily.     bumetanide 2 MG tablet  Commonly known as:  BUMEX  Take 2 tablets (4 mg total) by mouth 2 (two) times daily.     carvedilol 3.125 MG tablet  Commonly known as:  COREG  Take 1 tablet (3.125 mg total) by mouth 2 (two) times daily.     digoxin 0.125 MG tablet  Commonly known as:  LANOXIN  Take 1 tablet (0.125 mg total) by mouth daily.     doxycycline 100 MG tablet  Commonly known as:  VIBRA-TABS  Take 1 tablet (100 mg total) by mouth 2 (two) times daily. Until 4/18     hydrALAZINE 25 MG tablet  Commonly known as:  APRESOLINE  Take 1 tablet (25 mg total) by mouth every 8 (eight) hours.     HYDROcodone-acetaminophen 5-325 MG per tablet  Commonly known as:  NORCO/VICODIN  Take 2 tablets by mouth  every 4 (four) hours as needed for moderate pain or severe pain.     insulin aspart 100 UNIT/ML FlexPen  Commonly known as:  NOVOLOG FLEXPEN  Inject 5 Units into the skin 3 (three) times daily with meals.     insulin glargine 100 UNIT/ML injection  Commonly known as:  LANTUS  Inject 0.25 mLs (25 Units total) into the skin at bedtime.     isosorbide mononitrate 30 MG 24 hr tablet  Commonly known as:  IMDUR  Take 1 tablet (30 mg total) by mouth daily.     magnesium oxide 400 MG tablet  Commonly known as:  MAG-OX  Take 1 tablet (400 mg total) by mouth 2 (two) times daily.     metolazone 5 MG tablet  Commonly known as:  ZAROXOLYN  Take 1 tablet (5 mg total) by mouth as needed. Weight gain 3 pounds in 24 hours  milrinone 20 MG/100ML Soln infusion  Commonly known as:  PRIMACOR  Inject 41.825 mcg/min into the vein continuous.     potassium chloride SA 20 MEQ tablet  Commonly known as:  K-DUR,KLOR-CON  Take 3 tablets (60 mEq total) by mouth 2 (two) times daily.     spironolactone 25 MG tablet  Commonly known as:  ALDACTONE  Take 1 tablets (25 mg total) by mouth daily.       Allergies  Allergen Reactions  . Iodine Anaphylaxis  . Shellfish Allergy Anaphylaxis       Follow-up Information   Follow up with Lajean Saver, MD. (1-2 weeks, our office will call you)    Specialty:  General Surgery   Contact information:   [address] [place] [address] [phone]       Follow up with Baltazar Apo, PA-C In 1 week.   Specialty:  Physician Assistant   Contact information:   [address] [place] [address] [phone]        The results of significant diagnostics from this hospitalization (including imaging, microbiology, ancillary and laboratory) are listed below for reference.    Significant Diagnostic Studies: No results found.  Microbiology: No results found for this or any previous visit (from the past 240 hour(s)).   Labs: Basic  Metabolic Panel:  Recent Labs Lab 03/29/14 2145 03/30/14 0500 03/30/14 2015 03/31/14 1140 04/01/14 0615  NA 130* 136* 132* 132* 134*  K 2.5* 2.5* 3.0* 3.5* 3.2*  CL 76* 83* 81* 82* 86*  CO2 36* 36* 33* 32 32  GLUCOSE 182* 115* 283* 184* 134*  BUN 33* 31* 33* 30* 31*  CREATININE 1.20 1.22 1.41* 1.25 1.72*  CALCIUM 9.1 8.7 8.8 9.3 8.9  MG  --  1.5 1.9  --   --    Liver Function Tests:  Recent Labs Lab 03/31/14 1140  AST 23  ALT 10  ALKPHOS 98  BILITOT 0.8  PROT 8.9*  ALBUMIN 2.9*   No results found for this basename: LIPASE, AMYLASE,  in the last 168 hours No results found for this basename: AMMONIA,  in the last 168 hours CBC:  Recent Labs Lab 03/29/14 2145 03/30/14 0500 03/31/14 1140 04/01/14 0615  WBC 15.1* 13.4* 11.6* 12.6*  NEUTROABS 12.1*  --   --   --   HGB 13.4 11.6* 13.1 11.7*  HCT 38.8* 35.3* 38.9* 35.7*  MCV 75.0* 76.1* 75.1* 76.8*  PLT 337 328 396 381   Cardiac Enzymes: No results found for this basename: CKTOTAL, CKMB, CKMBINDEX, TROPONINI,  in the last 168 hours BNP: BNP (last 3 results)  Recent Labs  01/03/14 1248 01/11/14 1121  PROBNP 1164.0* 2156.0*   CBG:  Recent Labs Lab 03/31/14 1158 03/31/14 1701 03/31/14 2310 04/01/14 0859 04/01/14 1155  GLUCAP 187* 142* 156* 165* 296*       Signed:  Euna Armon  [facility] 04/01/2014, 1:21 PM

## 2014-04-03 ENCOUNTER — Encounter (HOSPITAL_COMMUNITY): Payer: Self-pay | Admitting: General Surgery

## 2014-04-07 ENCOUNTER — Ambulatory Visit: Payer: Medicare Other | Admitting: Endocrinology

## 2014-04-07 ENCOUNTER — Telehealth (HOSPITAL_COMMUNITY): Payer: Self-pay | Admitting: Anesthesiology

## 2014-04-07 NOTE — Telephone Encounter (Signed)
Reviewed labs.   Cr up from 1.7 (04/01/14) to 2.38. Will stop spiro currently and hold bumex for 3 days. K+ 3.4 take an extra 40 meq KCL today. MAg 1.4 increase mag oxide to 400 mg BID.  Follow up BMET next week.  Aundria Rud 4:12 PM

## 2014-04-10 ENCOUNTER — Other Ambulatory Visit: Payer: Self-pay

## 2014-04-10 ENCOUNTER — Encounter (HOSPITAL_COMMUNITY): Payer: Self-pay | Admitting: Emergency Medicine

## 2014-04-10 ENCOUNTER — Inpatient Hospital Stay (HOSPITAL_COMMUNITY)
Admission: EM | Admit: 2014-04-10 | Discharge: 2014-04-16 | DRG: 292 | Disposition: A | Discharge status: Home Health | Payer: Medicare Other | Attending: Cardiology | Admitting: Cardiology

## 2014-04-10 ENCOUNTER — Encounter (INDEPENDENT_AMBULATORY_CARE_PROVIDER_SITE_OTHER): Payer: Medicare Other | Admitting: General Surgery

## 2014-04-10 ENCOUNTER — Telehealth (HOSPITAL_COMMUNITY): Payer: Self-pay | Admitting: Cardiology

## 2014-04-10 ENCOUNTER — Emergency Department (HOSPITAL_COMMUNITY): Payer: Medicare Other

## 2014-04-10 DIAGNOSIS — N189 Chronic kidney disease, unspecified: Secondary | ICD-10-CM

## 2014-04-10 DIAGNOSIS — I872 Venous insufficiency (chronic) (peripheral): Secondary | ICD-10-CM | POA: Diagnosis present

## 2014-04-10 DIAGNOSIS — Z6841 Body Mass Index (BMI) 40.0 and over, adult: Secondary | ICD-10-CM

## 2014-04-10 DIAGNOSIS — IMO0001 Reserved for inherently not codable concepts without codable children: Secondary | ICD-10-CM | POA: Diagnosis present

## 2014-04-10 DIAGNOSIS — G4733 Obstructive sleep apnea (adult) (pediatric): Secondary | ICD-10-CM | POA: Diagnosis present

## 2014-04-10 DIAGNOSIS — E1165 Type 2 diabetes mellitus with hyperglycemia: Secondary | ICD-10-CM

## 2014-04-10 DIAGNOSIS — K612 Anorectal abscess: Secondary | ICD-10-CM | POA: Diagnosis present

## 2014-04-10 DIAGNOSIS — Z79899 Other long term (current) drug therapy: Secondary | ICD-10-CM

## 2014-04-10 DIAGNOSIS — N183 Chronic kidney disease, stage 3 unspecified: Secondary | ICD-10-CM | POA: Diagnosis present

## 2014-04-10 DIAGNOSIS — I472 Ventricular tachycardia, unspecified: Secondary | ICD-10-CM | POA: Diagnosis present

## 2014-04-10 DIAGNOSIS — E785 Hyperlipidemia, unspecified: Secondary | ICD-10-CM | POA: Diagnosis present

## 2014-04-10 DIAGNOSIS — I428 Other cardiomyopathies: Secondary | ICD-10-CM | POA: Diagnosis present

## 2014-04-10 DIAGNOSIS — I129 Hypertensive chronic kidney disease with stage 1 through stage 4 chronic kidney disease, or unspecified chronic kidney disease: Secondary | ICD-10-CM | POA: Diagnosis present

## 2014-04-10 DIAGNOSIS — N179 Acute kidney failure, unspecified: Secondary | ICD-10-CM | POA: Diagnosis present

## 2014-04-10 DIAGNOSIS — G4730 Sleep apnea, unspecified: Secondary | ICD-10-CM | POA: Diagnosis present

## 2014-04-10 DIAGNOSIS — Z794 Long term (current) use of insulin: Secondary | ICD-10-CM

## 2014-04-10 DIAGNOSIS — E119 Type 2 diabetes mellitus without complications: Secondary | ICD-10-CM | POA: Diagnosis present

## 2014-04-10 DIAGNOSIS — I509 Heart failure, unspecified: Secondary | ICD-10-CM | POA: Diagnosis present

## 2014-04-10 DIAGNOSIS — J45909 Unspecified asthma, uncomplicated: Secondary | ICD-10-CM | POA: Diagnosis present

## 2014-04-10 DIAGNOSIS — E876 Hypokalemia: Secondary | ICD-10-CM | POA: Diagnosis present

## 2014-04-10 DIAGNOSIS — I4729 Other ventricular tachycardia: Secondary | ICD-10-CM | POA: Diagnosis present

## 2014-04-10 DIAGNOSIS — I10 Essential (primary) hypertension: Secondary | ICD-10-CM | POA: Diagnosis present

## 2014-04-10 DIAGNOSIS — R079 Chest pain, unspecified: Secondary | ICD-10-CM

## 2014-04-10 DIAGNOSIS — I5023 Acute on chronic systolic (congestive) heart failure: Principal | ICD-10-CM | POA: Diagnosis present

## 2014-04-10 HISTORY — DX: Chronic systolic (congestive) heart failure: I50.22

## 2014-04-10 LAB — BASIC METABOLIC PANEL
BUN: 21 mg/dL (ref 6–23)
CHLORIDE: 92 meq/L — AB (ref 96–112)
CO2: 29 mEq/L (ref 19–32)
Calcium: 8.8 mg/dL (ref 8.4–10.5)
Creatinine, Ser: 1.86 mg/dL — ABNORMAL HIGH (ref 0.50–1.35)
GFR calc Af Amer: 53 mL/min — ABNORMAL LOW (ref >90)
GFR, EST NON AFRICAN AMERICAN: 45 mL/min — AB (ref >90)
GLUCOSE: 133 mg/dL — AB (ref 70–99)
Potassium: 2.9 mEq/L — CL (ref 3.7–5.3)
SODIUM: 137 meq/L (ref 137–147)

## 2014-04-10 LAB — MAGNESIUM: Magnesium: 1.5 mg/dL (ref 1.5–2.5)

## 2014-04-10 LAB — CBC
HCT: 33.5 % — ABNORMAL LOW (ref 39.0–52.0)
HEMOGLOBIN: 11.1 g/dL — AB (ref 13.0–17.0)
MCH: 25.5 pg — ABNORMAL LOW (ref 26.0–34.0)
MCHC: 33.1 g/dL (ref 30.0–36.0)
MCV: 76.8 fL — ABNORMAL LOW (ref 78.0–100.0)
Platelets: 288 10*3/uL (ref 150–400)
RBC: 4.36 MIL/uL (ref 4.22–5.81)
RDW: 18.4 % — ABNORMAL HIGH (ref 11.5–15.5)
WBC: 8 10*3/uL (ref 4.0–10.5)

## 2014-04-10 LAB — I-STAT TROPONIN, ED: Troponin i, poc: 0.04 ng/mL (ref 0.00–0.08)

## 2014-04-10 LAB — PRO B NATRIURETIC PEPTIDE: PRO B NATRI PEPTIDE: 1531 pg/mL — AB (ref 0–125)

## 2014-04-10 MED ORDER — HYDRALAZINE HCL 25 MG PO TABS
25.0000 mg | ORAL_TABLET | Freq: Three times a day (TID) | ORAL | Status: DC
  Administered 2014-04-11 – 2014-04-16 (×14): 25 mg via ORAL
  Filled 2014-04-10 (×21): qty 1

## 2014-04-10 MED ORDER — HYDROCODONE-ACETAMINOPHEN 5-325 MG PO TABS
2.0000 | ORAL_TABLET | ORAL | Status: DC | PRN
  Administered 2014-04-10 – 2014-04-15 (×12): 2 via ORAL
  Filled 2014-04-10 (×15): qty 2

## 2014-04-10 MED ORDER — ALBUTEROL SULFATE HFA 108 (90 BASE) MCG/ACT IN AERS: 2.0000 | INHALATION_SPRAY | Freq: Every day | RESPIRATORY_TRACT | Status: DC | PRN

## 2014-04-10 MED ORDER — ATORVASTATIN CALCIUM 80 MG PO TABS
80.0000 mg | ORAL_TABLET | Freq: Every day | ORAL | Status: DC
  Administered 2014-04-11 – 2014-04-16 (×6): 80 mg via ORAL
  Filled 2014-04-10 (×6): qty 1

## 2014-04-10 MED ORDER — DEXTROSE 5 % IV SOLN
15.0000 mg/h | INTRAVENOUS | Status: DC
  Administered 2014-04-11: 8 mg/h via INTRAVENOUS
  Administered 2014-04-11 (×2): 15 mg/h via INTRAVENOUS
  Filled 2014-04-10 (×4): qty 25

## 2014-04-10 MED ORDER — INSULIN GLARGINE 100 UNIT/ML ~~LOC~~ SOLN
25.0000 [IU] | Freq: Every day | SUBCUTANEOUS | Status: DC
  Administered 2014-04-11 – 2014-04-15 (×6): 25 [IU] via SUBCUTANEOUS
  Filled 2014-04-10 (×7): qty 0.25

## 2014-04-10 MED ORDER — MORPHINE SULFATE 4 MG/ML IJ SOLN
6.0000 mg | Freq: Once | INTRAMUSCULAR | Status: AC
  Administered 2014-04-10: 6 mg via INTRAVENOUS
  Filled 2014-04-10: qty 2

## 2014-04-10 MED ORDER — CARVEDILOL 3.125 MG PO TABS
3.1250 mg | ORAL_TABLET | Freq: Two times a day (BID) | ORAL | Status: DC
  Administered 2014-04-11: 3.125 mg via ORAL
  Filled 2014-04-10 (×3): qty 1

## 2014-04-10 MED ORDER — SODIUM CHLORIDE 0.9 % IJ SOLN
3.0000 mL | Freq: Two times a day (BID) | INTRAMUSCULAR | Status: DC
Start: 2014-04-10 — End: 2014-04-16
  Administered 2014-04-11 – 2014-04-16 (×8): 3 mL via INTRAVENOUS

## 2014-04-10 MED ORDER — FUROSEMIDE 10 MG/ML IJ SOLN: 80.0000 mg | Freq: Once | INTRAMUSCULAR | Status: DC

## 2014-04-10 MED ORDER — SODIUM CHLORIDE 0.9 % IJ SOLN
10.0000 mL | INTRAMUSCULAR | Status: DC | PRN
  Administered 2014-04-10 – 2014-04-16 (×10): 10 mL

## 2014-04-10 MED ORDER — SODIUM CHLORIDE 0.9 % IJ SOLN: 3.0000 mL | INTRAMUSCULAR | Status: DC | PRN

## 2014-04-10 MED ORDER — MILRINONE IN DEXTROSE 20 MG/100ML IV SOLN
0.3750 ug/kg/min | INTRAVENOUS | Status: DC
  Administered 2014-04-11: 0.375 ug/kg/min via INTRAVENOUS
  Administered 2014-04-11 (×2): 0.25 ug/kg/min via INTRAVENOUS
  Administered 2014-04-11 – 2014-04-16 (×19): 0.375 ug/kg/min via INTRAVENOUS
  Filled 2014-04-10 (×23): qty 100

## 2014-04-10 MED ORDER — INSULIN ASPART 100 UNIT/ML ~~LOC~~ SOLN
5.0000 [IU] | Freq: Three times a day (TID) | SUBCUTANEOUS | Status: DC
  Administered 2014-04-11 – 2014-04-16 (×14): 5 [IU] via SUBCUTANEOUS

## 2014-04-10 MED ORDER — POTASSIUM CHLORIDE CRYS ER 20 MEQ PO TBCR
40.0000 meq | EXTENDED_RELEASE_TABLET | Freq: Once | ORAL | Status: AC
  Administered 2014-04-10: 40 meq via ORAL
  Filled 2014-04-10: qty 2

## 2014-04-10 MED ORDER — SODIUM CHLORIDE 0.9 % IV SOLN
250.0000 mL | INTRAVENOUS | Status: DC | PRN
  Administered 2014-04-16: 250 mL via INTRAVENOUS

## 2014-04-10 MED ORDER — HEPARIN SODIUM (PORCINE) 5000 UNIT/ML IJ SOLN
5000.0000 [IU] | Freq: Three times a day (TID) | INTRAMUSCULAR | Status: DC
  Administered 2014-04-11 (×3): 5000 [IU] via SUBCUTANEOUS
  Filled 2014-04-10 (×21): qty 1

## 2014-04-10 MED ORDER — INSULIN ASPART 100 UNIT/ML FLEXPEN: 5.0000 [IU] | PEN_INJECTOR | Freq: Three times a day (TID) | SUBCUTANEOUS | Status: DC

## 2014-04-10 MED ORDER — ALBUTEROL SULFATE (2.5 MG/3ML) 0.083% IN NEBU: 2.5000 mg | INHALATION_SOLUTION | Freq: Four times a day (QID) | RESPIRATORY_TRACT | Status: DC | PRN

## 2014-04-10 MED ORDER — MAGNESIUM SULFATE 40 MG/ML IJ SOLN
2.0000 g | Freq: Once | INTRAMUSCULAR | Status: AC
  Administered 2014-04-11: 2 g via INTRAVENOUS
  Filled 2014-04-10: qty 50

## 2014-04-10 MED ORDER — ISOSORBIDE MONONITRATE ER 30 MG PO TB24
30.0000 mg | ORAL_TABLET | Freq: Every day | ORAL | Status: DC
  Administered 2014-04-11 – 2014-04-16 (×6): 30 mg via ORAL
  Filled 2014-04-10 (×6): qty 1

## 2014-04-10 MED ORDER — INSULIN ASPART 100 UNIT/ML ~~LOC~~ SOLN
0.0000 [IU] | Freq: Three times a day (TID) | SUBCUTANEOUS | Status: DC
  Administered 2014-04-11: 2 [IU] via SUBCUTANEOUS
  Administered 2014-04-11: 1 [IU] via SUBCUTANEOUS
  Administered 2014-04-11: 2 [IU] via SUBCUTANEOUS
  Administered 2014-04-12 (×2): 1 [IU] via SUBCUTANEOUS
  Administered 2014-04-13: 2 [IU] via SUBCUTANEOUS
  Administered 2014-04-13 – 2014-04-15 (×4): 1 [IU] via SUBCUTANEOUS

## 2014-04-10 NOTE — Progress Notes (Signed)
Pt. Was placed on BIPAP 22/16 per home settings via nasal mask. Pt. Placed himself on BIPAP & states that pressure is comfortable.

## 2014-04-10 NOTE — ED Notes (Addendum)
PT reports pounding heart pain  While at church on Sunday lasting 1 HR. Pt Hx of CHF with home infusion med ( Milrinone) via Picc RT upper arm. EMS reported BP 138/73  1 NGT given with a BP down to 94/59 no relief of CP. Pt weight gain 6 LBS since Friday. Bumex was held for 3 days per advice of PT MD.

## 2014-04-10 NOTE — Telephone Encounter (Signed)
Called to report weight gain/ weight today 345 lbs today Pt was advised not to weigh over the weekend since spiro and bumex held Just wanted to inform office as pt has appt on  4/21

## 2014-04-10 NOTE — H&P (Signed)
Cardiology History and Physical [name], MD Cardiologist: Dr. Haroldine Laws  History of Present Illness (and review of medical records): Steven Wyatt. is a 38 y.o. male who presents for evaluation of chest pain and palpitations.  Of note he has a history of NICM (nor cors Oct 0177), chronic systolic HF ECHO 08/3902 EF 20% on chronic home milrinone via PICC, morbid obesity, OSA on BIPAP, HTN, CKD uncontrolled DM, and HLD. He has been followed closely in HF clinic and he is on Milrinone at 0.40mcg.  He was just recently admitted for perirectal abscess s/p antibiotics and I/D by surgery and d/c on 4/11.  He recently had aldactone and bumex held for elevated Cr.  He states he has been having at palpitations since Wed.  On Sat, he had chest pain which felt like pounding pressure.  Over the course of next few days chest pain and palpitations continued and got progressively worse.  Pain last at most up to 6hrs.  Today chest pain began sharp in nature and rated 8-9/10.  He reports shortness of breath at rest along with nausea and vomiting.  He called EMS and was brought to ED for further evaluation.  He was given morphine for pain in ED.  Initial troponin was negative.  BNP was 1531.  Weight was up 345 (330 on discharge).    Previous diagnostic testing: Echo 03/2014: Study Conclusions - Left ventricle: Very poor image quality and little endocardial definition Consider MRO or contrast echo if clinically indicated to furhter assess EF The cavity size was severely dilated. The estimated ejection fraction was 35%. Diffuse hypokinesis. - Left atrium: The atrium was mildly dilated.  Pertinent items are noted in HPI. Further review of systems was otherwise negative other than stated in HPI.  Patient Active Problem List   Diagnosis Date Noted  . CHF NYHA class III (symptoms with mildly strenuous activities) 04/10/2014  . Diabetes mellitus 03/30/2014  . Pre-operative cardiovascular examination 03/30/2014  .  Perirectal abscess 03/29/2014  . DKA (diabetic ketoacidoses) 02/25/2014  . Hyponatremia 02/25/2014  . Chronic systolic heart failure 00/92/3300  . CKD (chronic kidney disease), stage III 02/20/2014  . Acute on chronic systolic heart failure 76/22/6333  . Microcytic anemia 01/04/2014  . Hypokalemia 01/04/2014  . Acute on chronic systolic CHF (congestive heart failure) 01/03/2014  . 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 Normal coronary arteries- last cath Oct 2014 at [facility] 01/03/2014  . Morbid obesity with BMI of 50.0-59.9, adult 01/03/2014  . Sleep apnea- he reports comliance with Bi Pap 01/03/2014  . HTN (hypertension) 01/03/2014  . Dyslipidemia 01/03/2014  . Asthma 01/03/2014   Past Medical History  Diagnosis Date  . CHF (congestive heart failure)   . Diabetes   . HTN (hypertension)   . Morbid obesity with BMI of 50.0-59.9, adult   . Sleep apnea     on Bi Pap  . Dyslipidemia   . CKD (chronic kidney disease), stage III   . Acute on chronic systolic CHF (congestive heart failure) 01/03/2014  . Asthma 01/03/2014  . Chronic systolic heart failure 04/24/5624  . DKA (diabetic ketoacidoses) 02/25/2014  . Hypokalemia 01/04/2014  . Hyponatremia 02/25/2014  . Microcytic anemia 01/04/2014  . 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 Normal coronary arteries- last cath Oct 2014 at [facility] 01/03/2014  . History of chicken pox   . Perirectal abscess 03/2014    Past Surgical History  Procedure Laterality Date  . Incision and drainage abscess N/A 03/31/2014  Procedure: INCISION AND DRAINAGE  PERI-RECTAL ABSCESS;  Surgeon: Ralene Ok, MD;  Location: [place];  Service: General;  Laterality: N/A;    Prescriptions prior to admission  Medication Sig Dispense Refill  . acetaminophen (TYLENOL) 325 MG tablet Take 325 mg by mouth every 6 (six) hours as needed for mild pain.      Marland Kitchen albuterol (PROVENTIL HFA;VENTOLIN HFA) 108 (90 BASE) MCG/ACT inhaler Inhale 2 puffs into the lungs  daily as needed for wheezing or shortness of breath.      Marland Kitchen atorvastatin (LIPITOR) 80 MG tablet Take 80 mg by mouth daily.      . bumetanide (BUMEX) 2 MG tablet Take 2 tablets (4 mg total) by mouth 2 (two) times daily.  120 tablet  6  . carvedilol (COREG) 3.125 MG tablet Take 1 tablet (3.125 mg total) by mouth 2 (two) times daily.  60 tablet  3  . digoxin (LANOXIN) 0.125 MG tablet Take 1 tablet (0.125 mg total) by mouth daily.  30 tablet  6  . doxycycline (VIBRA-TABS) 100 MG tablet Take 1 tablet (100 mg total) by mouth 2 (two) times daily.  14 tablet  0  . hydrALAZINE (APRESOLINE) 25 MG tablet Take 1 tablet (25 mg total) by mouth every 8 (eight) hours.  90 tablet  6  . HYDROcodone-acetaminophen (NORCO/VICODIN) 5-325 MG per tablet Take 2 tablets by mouth every 4 (four) hours as needed for moderate pain or severe pain.  30 tablet  0  . insulin aspart (NOVOLOG FLEXPEN) 100 UNIT/ML FlexPen Inject 5 Units into the skin 3 (three) times daily with meals.  15 mL  11  . insulin glargine (LANTUS) 100 UNIT/ML injection Inject 0.25 mLs (25 Units total) into the skin at bedtime.  10 mL  11  . isosorbide mononitrate (IMDUR) 30 MG 24 hr tablet Take 1 tablet (30 mg total) by mouth daily.  30 tablet  6  . magnesium oxide (MAG-OX) 400 MG tablet Take 1 tablet (400 mg total) by mouth 2 (two) times daily.  60 tablet  3  . metolazone (ZAROXOLYN) 5 MG tablet Take 1 tablet (5 mg total) by mouth as needed. Weight gain 3 pounds in 24 hours  8 tablet  6  . potassium chloride SA (K-DUR,KLOR-CON) 20 MEQ tablet Take 20-40 mEq by mouth 2 (two) times daily. Patient takes 6meq in the morning. 106meq at night time      . milrinone (PRIMACOR) 20 MG/100ML SOLN infusion Inject 41.825 mcg/min into the vein continuous.  100 mL  6   Allergies  Allergen Reactions  . Iodine Anaphylaxis  . Shellfish Allergy Anaphylaxis    History  Substance Use Topics  . Smoking status: Never Smoker   . Smokeless tobacco: Never Used  . Alcohol Use:  No    Family History  Problem Relation Age of Onset  . Hypertension Mother   . Diabetes Maternal Grandmother      Objective:  Patient Vitals for the past 8 hrs:  BP Temp Temp src Pulse Resp SpO2 Height Weight  04/11/14 0512 115/84 mmHg 97.9 F (36.6 C) Oral 111 19 96 % - -  04/11/14 0511 - - - - - - - 155.856 kg (343 lb 9.6 oz)  04/10/14 2344 - - - 115 20 94 % - -  04/10/14 2311 120/69 mmHg 98.3 F (36.8 C) Oral 115 18 94 % 5' 8.5" (1.74 m) 155.629 kg (343 lb 1.6 oz)  04/10/14 2255 103/63 mmHg - - 112 18 98 % - -  04/10/14 2215 99/67 mmHg - - 112 28 98 % - -   General appearance: alert, cooperative, obese male in NAD Head: Normocephalic, without obvious abnormality, atraumatic Eyes: conjunctivae/corneas clear. PERRL, EOM's intact. Fundi benign. Neck: thick neck Lungs: clear to auscultation bilaterally Chest wall: no tenderness Heart: tachycardic Abdomen: soft, non-tender; bowel sounds normal; no masses,  no organomegaly Extremities: chronic venous stasis changes Pulses: 2+ and symmetric Neurologic: Grossly normal  Results for orders placed during the hospital encounter of 04/10/14 (from the past 48 hour(s))  CBC     Status: Abnormal   Collection Time    04/10/14  6:34 PM      Result Value Ref Range   WBC 8.0  4.0 - 10.5 K/uL   RBC 4.36  4.22 - 5.81 MIL/uL   Hemoglobin 11.1 (*) 13.0 - 17.0 g/dL   HCT 33.5 (*) 39.0 - 52.0 %   MCV 76.8 (*) 78.0 - 100.0 fL   MCH 25.5 (*) 26.0 - 34.0 pg   MCHC 33.1  30.0 - 36.0 g/dL   RDW 18.4 (*) 11.5 - 15.5 %   Platelets 288  150 - 400 K/uL  BASIC METABOLIC PANEL     Status: Abnormal   Collection Time    04/10/14  6:34 PM      Result Value Ref Range   Sodium 137  137 - 147 mEq/L   Potassium 2.9 (*) 3.7 - 5.3 mEq/L   Comment: CRITICAL RESULT CALLED TO, READ BACK BY AND VERIFIED WITH:     T TEACHEY,RN 2020 04/10/14 D BRADLEY   Chloride 92 (*) 96 - 112 mEq/L   CO2 29  19 - 32 mEq/L   Glucose, Bld 133 (*) 70 - 99 mg/dL   BUN 21  6 -  23 mg/dL   Creatinine, Ser 1.86 (*) 0.50 - 1.35 mg/dL   Calcium 8.8  8.4 - 10.5 mg/dL   GFR calc non Af Amer 45 (*) >90 mL/min   GFR calc Af Amer 53 (*) >90 mL/min   Comment: (NOTE)     The eGFR has been calculated using the CKD EPI equation.     This calculation has not been validated in all clinical situations.     eGFR's persistently <90 mL/min signify possible Chronic Kidney     Disease.  D-DIMER, QUANTITATIVE     Status: Abnormal   Collection Time    04/10/14  6:34 PM      Result Value Ref Range   D-Dimer, Quant 1.85 (*) 0.00 - 0.48 ug/mL-FEU   Comment:            AT THE INHOUSE ESTABLISHED CUTOFF     VALUE OF 0.48 ug/mL FEU,     THIS ASSAY HAS BEEN DOCUMENTED     IN THE LITERATURE TO HAVE     A SENSITIVITY AND NEGATIVE     PREDICTIVE VALUE OF AT LEAST     98 TO 99%.  THE TEST RESULT     SHOULD BE CORRELATED WITH     AN ASSESSMENT OF THE CLINICAL     PROBABILITY OF DVT / VTE.  PRO B NATRIURETIC PEPTIDE     Status: Abnormal   Collection Time    04/10/14  6:59 PM      Result Value Ref Range   Pro B Natriuretic peptide (BNP) 1531.0 (*) 0 - 125 pg/mL  Randolm Idol, ED     Status: None   Collection Time    04/10/14  7:38 PM  Result Value Ref Range   Troponin i, poc 0.04  0.00 - 0.08 ng/mL   Comment 3            Comment: Due to the release kinetics of cTnI,     a negative result within the first hours     of the onset of symptoms does not rule out     myocardial infarction with certainty.     If myocardial infarction is still suspected,     repeat the test at appropriate intervals.  MAGNESIUM     Status: None   Collection Time    04/10/14  8:50 PM      Result Value Ref Range   Magnesium 1.5  1.5 - 2.5 mg/dL  CBC WITH DIFFERENTIAL     Status: Abnormal   Collection Time    04/11/14  4:43 AM      Result Value Ref Range   WBC 7.6  4.0 - 10.5 K/uL   RBC 4.11 (*) 4.22 - 5.81 MIL/uL   Hemoglobin 10.4 (*) 13.0 - 17.0 g/dL   HCT 31.7 (*) 39.0 - 52.0 %   MCV 77.1  (*) 78.0 - 100.0 fL   MCH 25.3 (*) 26.0 - 34.0 pg   MCHC 32.8  30.0 - 36.0 g/dL   RDW 18.5 (*) 11.5 - 15.5 %   Platelets 320  150 - 400 K/uL   Neutrophils Relative % 70  43 - 77 %   Neutro Abs 5.4  1.7 - 7.7 K/uL   Lymphocytes Relative 22  12 - 46 %   Lymphs Abs 1.6  0.7 - 4.0 K/uL   Monocytes Relative 5  3 - 12 %   Monocytes Absolute 0.4  0.1 - 1.0 K/uL   Eosinophils Relative 2  0 - 5 %   Eosinophils Absolute 0.1  0.0 - 0.7 K/uL   Basophils Relative 1  0 - 1 %   Basophils Absolute 0.0  0.0 - 0.1 K/uL   ECG:  Probable ectopic atrial tachycardia HR 118, LAD, LBBB, with PVCs  Assessment: Chest pain, atypical Acute on chronic systolic CHF NICM Hypokalemia Hypomagnesmia CKD stage III HTN DM OSA on BiPAP Morbid Obesity S/p recent I/D for perirectal abscess  Plan: 1. Cardiology  Admission  2. Continuous monitoring on Telemetry. 3. Repeat ekg on admit, prn chest pain or arrythmia 4. Cycle cardiac biomarkers 5. Will continue Milrinone gtt 6. Will start lasix gtt as opposed to boluses given recent fluctuation in renal function 7. Strict I/Os, daily weights 8. Replace electrolytes 9. Further medical management to include ASA, BB, hydralazine/imdur/ Statin, NTG prn 10. SSI 11. May need surgery consult for rectal packing s/p recent I/D.  Completed antibiotic course.

## 2014-04-10 NOTE — ED Notes (Signed)
Transporting patient to new room assignment. 

## 2014-04-10 NOTE — ED Notes (Signed)
Pt refusing to allow me to attempt to place IV.

## 2014-04-11 ENCOUNTER — Encounter (INDEPENDENT_AMBULATORY_CARE_PROVIDER_SITE_OTHER): Payer: Medicare Other | Admitting: Surgery

## 2014-04-11 ENCOUNTER — Encounter (HOSPITAL_COMMUNITY): Payer: Medicare Other

## 2014-04-11 ENCOUNTER — Other Ambulatory Visit: Payer: Self-pay

## 2014-04-11 DIAGNOSIS — I509 Heart failure, unspecified: Secondary | ICD-10-CM

## 2014-04-11 DIAGNOSIS — N189 Chronic kidney disease, unspecified: Secondary | ICD-10-CM

## 2014-04-11 DIAGNOSIS — N179 Acute kidney failure, unspecified: Secondary | ICD-10-CM

## 2014-04-11 DIAGNOSIS — I5023 Acute on chronic systolic (congestive) heart failure: Principal | ICD-10-CM

## 2014-04-11 LAB — GLUCOSE, CAPILLARY
GLUCOSE-CAPILLARY: 130 mg/dL — AB (ref 70–99)
Glucose-Capillary: 148 mg/dL — ABNORMAL HIGH (ref 70–99)
Glucose-Capillary: 201 mg/dL — ABNORMAL HIGH (ref 70–99)
Glucose-Capillary: 152 mg/dL — ABNORMAL HIGH (ref 70–99)
Glucose-Capillary: 159 mg/dL — ABNORMAL HIGH (ref 70–99)

## 2014-04-11 LAB — CBC WITH DIFFERENTIAL/PLATELET
BASOS ABS: 0 10*3/uL (ref 0.0–0.1)
Basophils Relative: 1 % (ref 0–1)
EOS ABS: 0.1 10*3/uL (ref 0.0–0.7)
EOS PCT: 2 % (ref 0–5)
HCT: 31.7 % — ABNORMAL LOW (ref 39.0–52.0)
Hemoglobin: 10.4 g/dL — ABNORMAL LOW (ref 13.0–17.0)
Lymphocytes Relative: 22 % (ref 12–46)
Lymphs Abs: 1.6 10*3/uL (ref 0.7–4.0)
MCH: 25.3 pg — AB (ref 26.0–34.0)
MCHC: 32.8 g/dL (ref 30.0–36.0)
MCV: 77.1 fL — ABNORMAL LOW (ref 78.0–100.0)
Monocytes Absolute: 0.4 10*3/uL (ref 0.1–1.0)
Monocytes Relative: 5 % (ref 3–12)
NEUTROS PCT: 70 % (ref 43–77)
Neutro Abs: 5.4 10*3/uL (ref 1.7–7.7)
PLATELETS: 320 10*3/uL (ref 150–400)
RBC: 4.11 MIL/uL — ABNORMAL LOW (ref 4.22–5.81)
RDW: 18.5 % — ABNORMAL HIGH (ref 11.5–15.5)
WBC: 7.6 10*3/uL (ref 4.0–10.5)

## 2014-04-11 LAB — CARBOXYHEMOGLOBIN
Carboxyhemoglobin: 1.4 % (ref 0.5–1.5)
Carboxyhemoglobin: 1.5 % (ref 0.5–1.5)
METHEMOGLOBIN: 0.4 % (ref 0.0–1.5)
METHEMOGLOBIN: 1 % (ref 0.0–1.5)
O2 SAT: 36.5 %
O2 Saturation: 97.7 %
TOTAL HEMOGLOBIN: 10.6 g/dL — AB (ref 13.5–18.0)
Total hemoglobin: 10.8 g/dL — ABNORMAL LOW (ref 13.5–18.0)

## 2014-04-11 LAB — BASIC METABOLIC PANEL
BUN: 21 mg/dL (ref 6–23)
CO2: 28 mEq/L (ref 19–32)
Calcium: 8.8 mg/dL (ref 8.4–10.5)
Chloride: 93 mEq/L — ABNORMAL LOW (ref 96–112)
Creatinine, Ser: 1.92 mg/dL — ABNORMAL HIGH (ref 0.50–1.35)
GFR, EST AFRICAN AMERICAN: 51 mL/min — AB (ref >90)
GFR, EST NON AFRICAN AMERICAN: 44 mL/min — AB (ref >90)
Glucose, Bld: 133 mg/dL — ABNORMAL HIGH (ref 70–99)
POTASSIUM: 3.2 meq/L — AB (ref 3.7–5.3)
SODIUM: 137 meq/L (ref 137–147)

## 2014-04-11 LAB — DIGOXIN LEVEL: Digoxin Level: 0.5 ng/mL — ABNORMAL LOW (ref 0.8–2.0)

## 2014-04-11 LAB — MAGNESIUM: Magnesium: 1.9 mg/dL (ref 1.5–2.5)

## 2014-04-11 LAB — D-DIMER, QUANTITATIVE (NOT AT ARMC): D DIMER QUANT: 1.85 ug{FEU}/mL — AB (ref 0.00–0.48)

## 2014-04-11 MED ORDER — POTASSIUM CHLORIDE CRYS ER 20 MEQ PO TBCR
40.0000 meq | EXTENDED_RELEASE_TABLET | Freq: Three times a day (TID) | ORAL | Status: DC
  Administered 2014-04-11 (×2): 40 meq via ORAL
  Filled 2014-04-11 (×5): qty 2

## 2014-04-11 MED ORDER — MORPHINE SULFATE 4 MG/ML IJ SOLN
4.0000 mg | Freq: Once | INTRAMUSCULAR | Status: AC
  Administered 2014-04-11: 4 mg via INTRAVENOUS
  Filled 2014-04-11: qty 1

## 2014-04-11 MED ORDER — DIGOXIN 125 MCG PO TABS
0.1250 mg | ORAL_TABLET | Freq: Every day | ORAL | Status: DC
  Administered 2014-04-11 – 2014-04-16 (×6): 0.125 mg via ORAL
  Filled 2014-04-11 (×6): qty 1

## 2014-04-11 MED ORDER — POTASSIUM CHLORIDE CRYS ER 20 MEQ PO TBCR
40.0000 meq | EXTENDED_RELEASE_TABLET | ORAL | Status: AC
  Administered 2014-04-11 (×2): 40 meq via ORAL
  Filled 2014-04-11 (×2): qty 2

## 2014-04-11 NOTE — Progress Notes (Signed)
Patient requesting IV pain medicine.  MD on call notified. Awaiting new orders. RN will continue to monitor. Louretta Parma, RN

## 2014-04-11 NOTE — Plan of Care (Signed)
Problem: Phase I Progression Outcomes Goal: EF % per last Echo/documented,Core Reminder form on chart Outcome: Completed/Met Date Met:  04/11/14 EF 35% per ECHO done in April 2015.     

## 2014-04-11 NOTE — Progress Notes (Signed)
Patient evaluated for community based chronic disease management services with [facility] Care Management Program as a benefit of patient's [employer]. Spoke with patient and spouse at bedside to explain [facility] Care Management services.  Patient is having difficulty affording medications.  He uses [facility] [address] in [place].  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  They recently moved here four months ago from [place].  He is followed by the heart failure clinic currently.  We will provide a pharmacy, social worker, and Building surveyor referrals at discharge.  Written consents obtained.  Patient will receive a post discharge transition of care call and will be evaluated for monthly home visits for assessments and disease process education.  Left contact information and [facility] literature at bedside. Made Inpatient Case Manager aware that [facility] Care Management following. Of note, [facility] Care Management services does not replace or interfere with any services that are arranged by inpatient case management or social work.  For additional questions or referrals please contact Anibal Henderson BSN RN [facility] Liaison at [phone].

## 2014-04-11 NOTE — Care Management Note (Addendum)
    Page 1 of 2   04/14/2014     3:02:00 PM CARE MANAGEMENT NOTE 04/14/2014  Patient:  EULES, CRUMBLISS   Account Number:  [id number]  Date Initiated:  04/11/2014  Documentation initiated by:  [facility]  Subjective/Objective Assessment:   38 yo male admitted with Chest pain, atypical, Acute on chronic systolic CHF. S/p recent I/D for perirectal abscess.//Home with spouse.     Action/Plan:   Milrinone gtt;  lasix gtt as opposed to boluses given recent fluctuation in renal function; Strict I/Os, daily weights//Resume Home Health services   Anticipated DC Date:  04/15/2014   Anticipated DC Plan:  HOME W HOME HEALTH SERVICES      DC Planning Services  CM consult  HF Clinic      [facility] Choice  Resumption Of Svcs/PTA Provider   Choice offered to / List presented to:  C-1 Patient   DME arranged  IV PUMP/EQUIPMENT      DME agency  [facility]     [facility] arranged  HH-1 RN  HH-10 DISEASE MANAGEMENT      HH agency  [facility]   Status of service:  Completed, signed off Medicare Important Message given?   (If response is "NO", the following Medicare IM given date fields will be blank) Date Medicare IM given:   Date Additional Medicare IM given:    Discharge Disposition:    Per UR Regulation:  Reviewed for med. necessity/level of care/duration of stay  If discussed at Long Length of Stay Meetings, dates discussed:    Comments:  04/14/14 1430 Oletta Cohn, RN, BSN, [phone] [facility] orders placed,  faxed and received on  04/12/14. Anell Barr and Tresa Endo of [facility] notified and will fit pt prior to d/c home (tentatively 4/25).  04/11/14 1100 Ralphael Southgate, RN, BSN, [place] [phone] Pt currently active with [facility] for RN/ IV services.  Resumption of care requested.  Judeth Cornfield of [facility] notified.  No DME needs identified at this time.

## 2014-04-11 NOTE — Progress Notes (Signed)
Utilization Review Completed Daliana Leverett J. Yee Gangi, RN, BSN, NCM [phone]  

## 2014-04-11 NOTE — Progress Notes (Addendum)
Advanced Heart Failure Rounding Note   Subjective:    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 He has been followed closely in HF clinic chronic home Milrinone 0.375 mcg. Most recent admit 4/8 due to rectal abscess. Milrinone was cut back to 0.25 mcg during this admit due to elevated CO-OX. He had I & D performed 03/31/14. Discharge weight was 330 pounds. On 04/07/14 creatinine was up from 1.7 to 2.3 and he was instructed to hold bumex for 3 days and stop spiro. He says his weight has trending up from 330-345 pounds. He says his urine output has decreased since Milrinone was cut back to 0.25 mcg.   Admitted with chest pain and nausea.   CEs negative. Started on lasix drip at 8 mg per hour. He remained on Milrinone at 0.25 mcg.    Denies SOB/Orthopnea/CP   CO-OX 97% will repeat     Objective:   Weight Range:  Vital Signs:   Temp:  [97.9 F (36.6 C)-98.3 F (36.8 C)] 97.9 F (36.6 C) (04/21 0512) Pulse Rate:  [107-118] 111 (04/21 0512) Resp:  [18-28] 19 (04/21 0512) BP: (93-120)/(60-84) 115/84 mmHg (04/21 0512) SpO2:  [94 %-99 %] 96 % (04/21 0512) Weight:  [343 lb 1.6 oz (155.629 kg)-345 lb (156.491 kg)] 343 lb 9.6 oz (155.856 kg) (04/21 0511) Last BM Date: 04/09/14  Weight change: Filed Weights   04/10/14 1828 04/10/14 2311 04/11/14 0511  Weight: 345 lb (156.491 kg) 343 lb 1.6 oz (155.629 kg) 343 lb 9.6 oz (155.856 kg)    Intake/Output:   Intake/Output Summary (Last 24 hours) at 04/11/14 0815 Last data filed at 04/11/14 0813  Gross per 24 hour  Intake 708.91 ml  Output    500 ml  Net 208.91 ml     Physical Exam: General:  Obese male. Well appearing. No resp difficulty. Sitting on the side of the bed.  HEENT: normal Neck: supple. JVP difficult to assess due to body habitus but appears elevated.  Carotids  2+ bilat; no bruits. No lymphadenopathy or thryomegaly appreciated. Cor: PMI nondisplaced. Tachycardic Regular rate & rhythm. No rubs, gallops or murmurs. Lungs: clear Abdomen: Obese, soft, nontender, nondistended. No hepatosplenomegaly. No bruits or masses. Good bowel sounds. Extremities: no cyanosis, clubbing, rash, edema RUE PICC Neuro: alert & orientedx3, cranial nerves grossly intact. moves all 4 extremities w/o difficulty. Affect pleasant  Telemetry: ST  Labs: Basic Metabolic Panel:  Recent Labs Lab 04/10/14 1834 04/10/14 2050 04/11/14 0443  NA 137  --  137  K 2.9*  --  3.2*  CL 92*  --  93*  CO2 29  --  28  GLUCOSE 133*  --  133*  BUN 21  --  21  CREATININE 1.86*  --  1.92*  CALCIUM 8.8  --  8.8  MG  --  1.5 1.9    Liver Function Tests: No results found for this basename: AST, ALT, ALKPHOS, BILITOT, PROT, ALBUMIN,  in the last 168 hours No results found for this basename: LIPASE, AMYLASE,  in the last 168 hours No results found for this basename: AMMONIA,  in the last 168 hours  CBC:  Recent Labs Lab 04/10/14 1834 04/11/14 0443  WBC 8.0 7.6  NEUTROABS  --  5.4  HGB 11.1* 10.4*  HCT 33.5* 31.7*  MCV  76.8* 77.1*  PLT 288 320    Cardiac Enzymes: No results found for this basename: CKTOTAL, CKMB, CKMBINDEX, TROPONINI,  in the last 168 hours  BNP: BNP (last 3 results)  Recent Labs  01/03/14 1248 01/11/14 1121 04/10/14 1859  PROBNP 1164.0* 2156.0* 1531.0*     Other results:  Imaging: Dg Chest 2 View  04/10/2014   CLINICAL DATA:  Left-sided chest pain and shortness of breath for 3 days.  EXAM: CHEST  2 VIEW  COMPARISON:  02/24/2014  FINDINGS: Cardiac enlargement with pulmonary vascular congestion. Vascular congestion is increasing and there is interval development of interstitial edema and small bilateral pleural effusions since the previous study. Right PICC line remains unchanged in position.  IMPRESSION: Cardiac enlargement with increasing  pulmonary vascular congestion and developing edema.   Electronically Signed   By: Burman Nieves M.D.   On: 04/10/2014 21:58      Medications:     Scheduled Medications: . atorvastatin  80 mg Oral Daily  . carvedilol  3.125 mg Oral BID WC  . furosemide  80 mg Intravenous Once  . heparin  5,000 Units Subcutaneous 3 times per day  . hydrALAZINE  25 mg Oral 3 times per day  . insulin aspart  0-9 Units Subcutaneous TID WC  . insulin aspart  5 Units Subcutaneous TID WC  . insulin glargine  25 Units Subcutaneous QHS  . isosorbide mononitrate  30 mg Oral Daily  . sodium chloride  3 mL Intravenous Q12H     Infusions: . furosemide (LASIX) infusion 8 mg/hr (04/11/14 0012)  . milrinone 0.25 mcg/kg/min (04/11/14 0136)     PRN Medications:  sodium chloride, albuterol, HYDROcodone-acetaminophen, sodium chloride, sodium chloride   Assessment:   1. Chest Pain  2. Chronic Systolic Heart Failure- on home milrinone 0.25 mcg  3. OAS- on BiPap  4. DM  5. Morbid Obesity  6. Hypokalemia 7. S/P I & D perirectal abscess 03/31/14  8. CKD- creatinine 1.9   Plan/Discussion:    Remains volume overloaded which is likely due in part to reduced Milrinone. Weight down 2 pounds. Sluggish urine output noted. Hopefully can get another 10 pounds off.  Increase lasix drip to 15 mg per hour. Check CO-OX i. Increase Milrinone to 0.375 mcg. Continue low dose carvedilol as well as digoxin. (dig level 0.5). No Ace/spiro due to CKD.   Consult cardiac rehab.   Length of Stay: 1 Amy D Clegg NP-C  04/11/2014, 8:15 AM  Advanced Heart Failure Team Pager [phone] (M-F; 7a - 4p)  Please contact [facility] for night-coverage after hours (4p -7a ) and weekends on [email or web address]   Patient seen and examined with Tonye Becket, NP. We discussed all aspects of the encounter. I agree with the assessment and plan as stated above. Patient with recurrent HF symptoms with 15 pound weight gain and cardiorenal syndrome  after decreasing milrinone. 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 Will increase back to 0.375. Continue diuresis. Recheck co-ox in am. Will need to start work-up for VAD candidacy. Supp K+. Will hold carvedilol for now and restart digoxin.   Bevelyn Buckles Storey Stangeland,MD 10:36 AM

## 2014-04-11 NOTE — Progress Notes (Signed)
CARDIAC REHAB PHASE I   PRE:  Rate/Rhythm: 109 ST  BP:  Supine:   Sitting: 109/71  Standing:    SaO2: 95%RA  MODE:  Ambulation: 400 ft   POST:  Rate/Rhythm: 119  BP:  Supine:   Sitting: 107/76  Standing:    SaO2: 88%RA, 92% with rest 1350-1422 Pt walked 400 ft with steady gait. Tolerated well. No DOE.  Pt stated he had had some chest discomfort with position change like sitting to lying or lying to sitting. Did not have chest discomfort with walk. Desat after walk but quickly up to 92% with rest.    Luetta Nutting, RN BSN  04/11/2014 2:20 PM

## 2014-04-11 NOTE — ED Provider Notes (Signed)
CSN: [id number]     Arrival date & time 04/10/14  1816 History   First MD Initiated Contact with Patient 04/10/14 1830     Chief Complaint  Patient presents with  . Chest Pain  . Shortness of Breath  . Congestive Heart Failure     (Consider location/radiation/quality/duration/timing/severity/associated sxs/prior Treatment) HPI Comments: Instructed to hold his Bumex and spironolactone by his cardiologist.  Patient is a 38 y.o. male presenting with chest pain, shortness of breath, and CHF. The history is provided by the patient.  Chest Pain Pain location:  Substernal area Pain quality: sharp   Pain radiates to:  Does not radiate Pain radiates to the back: no   Pain severity:  Moderate Onset quality:  Sudden Duration:  2 days Timing:  Intermittent Progression:  Worsening Chronicity:  New Context: at rest   Context: not breathing and no drug use   Relieved by:  Nothing Worsened by:  Nothing tried Ineffective treatments:  Nitroglycerin and aspirin Associated symptoms: nausea, shortness of breath and vomiting   Associated symptoms: no abdominal pain, no cough and no fever   Shortness of Breath Associated symptoms: chest pain and vomiting   Associated symptoms: no abdominal pain, no cough and no fever   Congestive Heart Failure Associated symptoms include chest pain and shortness of breath. Pertinent negatives include no abdominal pain.    Past Medical History  Diagnosis Date  . CHF (congestive heart failure)   . Diabetes   . HTN (hypertension)   . Morbid obesity with BMI of 50.0-59.9, adult   . Sleep apnea     on Bi Pap  . Dyslipidemia   . CKD (chronic kidney disease), stage III   . Acute on chronic systolic CHF (congestive heart failure) 01/03/2014  . Asthma 01/03/2014  . Chronic systolic heart failure 02/20/2014  . DKA (diabetic ketoacidoses) 02/25/2014  . Hypokalemia 01/04/2014  . Hyponatremia 02/25/2014  . Microcytic anemia 01/04/2014  . NICM (nonischemic cardiomyopathy)  01/03/2014  . Normal coronary arteries- last cath Oct 2014 at [facility] 01/03/2014  . History of chicken pox   . Perirectal abscess 03/2014   Past Surgical History  Procedure Laterality Date  . Incision and drainage abscess N/A 03/31/2014    Procedure: INCISION AND DRAINAGE  PERI-RECTAL ABSCESS;  Surgeon: Axel Filler, MD;  Location: [facility] OR;  Service: General;  Laterality: N/A;   Family History  Problem Relation Age of Onset  . Hypertension Mother   . Diabetes Maternal Grandmother    History  Substance Use Topics  . Smoking status: Never Smoker   . Smokeless tobacco: Never Used  . Alcohol Use: No    Review of Systems  Constitutional: Negative for fever and chills.  Respiratory: Positive for shortness of breath. Negative for cough.   Cardiovascular: Positive for chest pain.  Gastrointestinal: Positive for nausea and vomiting. Negative for abdominal pain.  All other systems reviewed and are negative.     Allergies  Iodine and Shellfish allergy  Home Medications   Prior to Admission medications   Medication Sig Start Date End Date Taking? Authorizing Provider  acetaminophen (TYLENOL) 325 MG tablet Take 325 mg by mouth every 6 (six) hours as needed for mild pain.   Yes Historical Provider, MD  albuterol (PROVENTIL HFA;VENTOLIN HFA) 108 (90 BASE) MCG/ACT inhaler Inhale 2 puffs into the lungs daily as needed for wheezing or shortness of breath. 01/08/14  Yes Wilburt Finlay, PA-C  atorvastatin (LIPITOR) 80 MG tablet Take 80 mg by mouth daily. 01/08/14  Yes Wilburt Finlay, PA-C  bumetanide (BUMEX) 2 MG tablet Take 2 tablets (4 mg total) by mouth 2 (two) times daily. 01/16/14  Yes Amy D Clegg, NP  carvedilol (COREG) 3.125 MG tablet Take 1 tablet (3.125 mg total) by mouth 2 (two) times daily. 02/20/14  Yes Dolores Patty, MD  digoxin (LANOXIN) 0.125 MG tablet Take 1 tablet (0.125 mg total) by mouth daily. 01/16/14  Yes Amy D Clegg, NP  doxycycline (VIBRA-TABS) 100 MG tablet Take 1 tablet  (100 mg total) by mouth 2 (two) times daily. 04/01/14  Yes Nishant Dhungel, MD  hydrALAZINE (APRESOLINE) 25 MG tablet Take 1 tablet (25 mg total) by mouth every 8 (eight) hours. 01/23/14  Yes Amy D Clegg, NP  HYDROcodone-acetaminophen (NORCO/VICODIN) 5-325 MG per tablet Take 2 tablets by mouth every 4 (four) hours as needed for moderate pain or severe pain. 04/01/14  Yes Nishant Dhungel, MD  insulin aspart (NOVOLOG FLEXPEN) 100 UNIT/ML FlexPen Inject 5 Units into the skin 3 (three) times daily with meals. 03/09/14  Yes Baltazar Apo, PA-C  insulin glargine (LANTUS) 100 UNIT/ML injection Inject 0.25 mLs (25 Units total) into the skin at bedtime. 02/26/14  Yes Hollice Espy, MD  isosorbide mononitrate (IMDUR) 30 MG 24 hr tablet Take 1 tablet (30 mg total) by mouth daily. 01/16/14  Yes Amy D Clegg, NP  magnesium oxide (MAG-OX) 400 MG tablet Take 1 tablet (400 mg total) by mouth 2 (two) times daily. 03/07/14  Yes Aundria Rud, NP  metolazone (ZAROXOLYN) 5 MG tablet Take 1 tablet (5 mg total) by mouth as needed. Weight gain 3 pounds in 24 hours 01/16/14  Yes Amy D Clegg, NP  potassium chloride SA (K-DUR,KLOR-CON) 20 MEQ tablet Take 20-40 mEq by mouth 2 (two) times daily. Patient takes in the morning. at night time 03/13/14  Yes Dolores Patty, MD  milrinone [facility]) 20 MG/100ML SOLN infusion Inject 41.825 mcg/min into the vein continuous. 03/28/14   Aundria Rud, NP   BP 120/69  Pulse 115  Temp(Src) 98.3 F (36.8 C) (Oral)  Resp 20  Ht 5' 8.5" (1.74 m)  Wt 343 lb 1.6 oz (155.629 kg)  BMI 51.40 kg/m2  SpO2 94% Physical Exam  Nursing note and vitals reviewed. Constitutional: He is oriented to person, place, and time. He appears well-developed and well-nourished. No distress.  HENT:  Head: Normocephalic and atraumatic.  Mouth/Throat: Oropharynx is clear and moist. No oropharyngeal exudate.  Eyes: EOM are normal. Pupils are equal, round, and reactive to light. Right eye exhibits no  discharge. Left eye exhibits no discharge.  Neck: Normal range of motion. Neck supple.  Cardiovascular: Normal rate and regular rhythm.  Exam reveals no friction rub.   No murmur heard. Pulmonary/Chest: Effort normal and breath sounds normal. No respiratory distress. He has no wheezes. He has no rales. He exhibits no tenderness.  Abdominal: Soft. He exhibits no distension. There is no tenderness. There is no rebound.  Musculoskeletal: Normal range of motion. He exhibits no edema.  Neurological: He is alert and oriented to person, place, and time.  Skin: No rash noted. He is not diaphoretic.    ED Course  Procedures (including critical care time) Labs Review Labs Reviewed  CBC - Abnormal; Notable for the following:    Hemoglobin 11.1 (*)    HCT 33.5 (*)    MCV 76.8 (*)    MCH 25.5 (*)    RDW 18.4 (*)    All other components within  normal limits  BASIC METABOLIC PANEL - Abnormal; Notable for the following:    Potassium 2.9 (*)    Chloride 92 (*)    Glucose, Bld 133 (*)    Creatinine, Ser 1.86 (*)    GFR calc non Af Amer 45 (*)    GFR calc Af Amer 53 (*)    All other components within normal limits  PRO B NATRIURETIC PEPTIDE - Abnormal; Notable for the following:    Pro B Natriuretic peptide (BNP) 1531.0 (*)    All other components within normal limits  D-DIMER, QUANTITATIVE - Abnormal; Notable for the following:    D-Dimer, Quant 1.85 (*)    All other components within normal limits  MAGNESIUM  BASIC METABOLIC PANEL  MAGNESIUM  DIGOXIN LEVEL  CBC WITH DIFFERENTIAL  I-STAT TROPOININ, ED    Imaging Review Dg Chest 2 View  04/10/2014   CLINICAL DATA:  Left-sided chest pain and shortness of breath for 3 days.  EXAM: CHEST  2 VIEW  COMPARISON:  02/24/2014  FINDINGS: Cardiac enlargement with pulmonary vascular congestion. Vascular congestion is increasing and there is interval development of interstitial edema and small bilateral pleural effusions since the previous study. Right  PICC line remains unchanged in position.  IMPRESSION: Cardiac enlargement with increasing pulmonary vascular congestion and developing edema.   Electronically Signed   By: Burman NievesWilliam  Stevens M.D.   On: 04/10/2014 21:58     EKG Interpretation None      Date: 04/11/2014  Rate: 118  Rhythm: sinus tachycardia  QRS Axis: left  Intervals: normal  ST/T Wave abnormalities: normal  Conduction Disutrbances:left bundle branch block  Narrative Interpretation:   Old EKG Reviewed: unchanged    MDM   Final diagnoses:  Chest pain    38 year old male with history of nonischemic cardiomyopathy, CHF on home milrinone infusion presents with chest pain. Both lobes of the past 2 days. Different than prior chest pains. No relief with aspirin or nitroglycerin. Associated nausea, vomiting. It is sharp in the central chest and nonradiating. He states extensive history of workups for blood clots without result. He is allergic to iodine and cannot have a CT scan of his chest. Here, her vitals showed tachycardia in the 110s. Blood pressures are normal. Lungs without basilar rails, no frank pitting edema. Labs show mild elevation in BNP of 1500. Also she'll hypokalemia. PO replacement given.  Morphine given for pain. Cards consult at will admit for his chest pain.    Dagmar HaitWilliam Lynnsey Barbara, MD 04/11/14 0030

## 2014-04-11 NOTE — Progress Notes (Signed)
[facility]  Patient Status: Active pt with [facility] up to this readmission  [facility] is providing the following services:   HHRN, [facility] for home  Milrinone. [facility] team will follow Mr. Richman while inpatient and support transition home when deemed appropriate.   If patient discharges after hours, please call [phone].   [name] 04/11/2014, 3:27 PM

## 2014-04-12 ENCOUNTER — Encounter: Payer: Medicare Other | Admitting: Internal Medicine

## 2014-04-12 LAB — CARBOXYHEMOGLOBIN
CARBOXYHEMOGLOBIN: 1.8 % — AB (ref 0.5–1.5)
METHEMOGLOBIN: 0.7 % (ref 0.0–1.5)
O2 Saturation: 68.4 %
Total hemoglobin: 10 g/dL — ABNORMAL LOW (ref 13.5–18.0)

## 2014-04-12 LAB — GLUCOSE, CAPILLARY
GLUCOSE-CAPILLARY: 144 mg/dL — AB (ref 70–99)
GLUCOSE-CAPILLARY: 128 mg/dL — AB (ref 70–99)
Glucose-Capillary: 118 mg/dL — ABNORMAL HIGH (ref 70–99)
Glucose-Capillary: 154 mg/dL — ABNORMAL HIGH (ref 70–99)

## 2014-04-12 LAB — MAGNESIUM: MAGNESIUM: 1.6 mg/dL (ref 1.5–2.5)

## 2014-04-12 LAB — BASIC METABOLIC PANEL
BUN: 22 mg/dL (ref 6–23)
BUN: 22 mg/dL (ref 6–23)
CALCIUM: 9 mg/dL (ref 8.4–10.5)
CO2: 28 meq/L (ref 19–32)
CO2: 28 meq/L (ref 19–32)
Calcium: 8.7 mg/dL (ref 8.4–10.5)
Chloride: 94 mEq/L — ABNORMAL LOW (ref 96–112)
Chloride: 92 mEq/L — ABNORMAL LOW (ref 96–112)
Creatinine, Ser: 2.08 mg/dL — ABNORMAL HIGH (ref 0.50–1.35)
Creatinine, Ser: 1.97 mg/dL — ABNORMAL HIGH (ref 0.50–1.35)
GFR calc Af Amer: 46 mL/min — ABNORMAL LOW (ref >90)
GFR calc non Af Amer: 40 mL/min — ABNORMAL LOW (ref >90)
GFR, EST AFRICAN AMERICAN: 49 mL/min — AB (ref >90)
GFR, EST NON AFRICAN AMERICAN: 42 mL/min — AB (ref >90)
GLUCOSE: 150 mg/dL — AB (ref 70–99)
Glucose, Bld: 123 mg/dL — ABNORMAL HIGH (ref 70–99)
POTASSIUM: 3.8 meq/L (ref 3.7–5.3)
Potassium: 4.1 mEq/L (ref 3.7–5.3)
SODIUM: 135 meq/L — AB (ref 137–147)
Sodium: 137 mEq/L (ref 137–147)

## 2014-04-12 MED ORDER — POTASSIUM CHLORIDE CRYS ER 20 MEQ PO TBCR
40.0000 meq | EXTENDED_RELEASE_TABLET | Freq: Two times a day (BID) | ORAL | Status: DC
  Administered 2014-04-12 – 2014-04-13 (×3): 40 meq via ORAL
  Filled 2014-04-12 (×11): qty 2

## 2014-04-12 MED ORDER — ONDANSETRON HCL 4 MG/2ML IJ SOLN
4.0000 mg | Freq: Four times a day (QID) | INTRAMUSCULAR | Status: DC | PRN
  Administered 2014-04-12 – 2014-04-15 (×5): 4 mg via INTRAVENOUS
  Filled 2014-04-12 (×5): qty 2

## 2014-04-12 MED ORDER — MAGNESIUM OXIDE 400 (241.3 MG) MG PO TABS
400.0000 mg | ORAL_TABLET | Freq: Two times a day (BID) | ORAL | Status: DC
  Administered 2014-04-12 – 2014-04-16 (×9): 400 mg via ORAL
  Filled 2014-04-12 (×11): qty 1

## 2014-04-12 MED ORDER — MAGNESIUM SULFATE 40 MG/ML IJ SOLN
2.0000 g | Freq: Once | INTRAMUSCULAR | Status: AC
  Administered 2014-04-12: 2 g via INTRAVENOUS
  Filled 2014-04-12: qty 50

## 2014-04-12 MED ORDER — CARVEDILOL 3.125 MG PO TABS
3.1250 mg | ORAL_TABLET | Freq: Two times a day (BID) | ORAL | Status: DC
  Administered 2014-04-12 – 2014-04-16 (×8): 3.125 mg via ORAL
  Filled 2014-04-12 (×12): qty 1

## 2014-04-12 NOTE — Progress Notes (Signed)
Placed patient on BIPAP for the night with IPAP at 22 and EPAP at 16 as per home settings

## 2014-04-12 NOTE — Progress Notes (Signed)
CARDIAC REHAB PHASE I   PRE:  Rate/Rhythm: 109 ST with PVC    BP: sitting 116/72    SaO2: 97 RA  MODE:  Ambulation: 460 ft   POST:  Rate/Rhythm: 133 ST    BP: sitting 120/90     SaO2: 88-89 RA  tolerated well, sts he feels good. Slight SOB when inquired about. SaO2 low. Encouraged pt to walk independently. [phone]  Megan Salon CES, ACSM 04/12/2014 11:00 AM

## 2014-04-12 NOTE — Progress Notes (Signed)
pts lasix gtt d/c today at 749 per PA Amy Clegg

## 2014-04-12 NOTE — Progress Notes (Signed)
Pt nauseous this AM, with emesisx1 also complained of chest pain. VSS. EKG performed. Pt states he does not have the urge to void and that his lasix is not working. Pt with 750cc urine output overnight. Pt on lasix gtt. Rhonda Barrett notified. New order for Zofran and to give vicodin. Pt refused pain medicine, hydralazine and insulin at this time. Pt given zofran. Will continue to monitor. Huel Coventry, RN

## 2014-04-12 NOTE — Consult Note (Addendum)
WOC wound consult note Reason for Consult: Pt had I&D performed on 4/9 by Dr Antonieta Pert for buttock abscess.  He was scheduled to have follow-up apt today and have wound assessed and packing removed at that time, which he states has been in place for 1 week.  He is requesting CCS team be consulted and declines WOC assessment.  Informed CHF team and they plan to consult the surgical team for further assessment and plan of care. Please re-consult if further assistance is needed.  Thank-you,  Cammie Mcgee MSN, RN, CWOCN, [place], CNS [phone]

## 2014-04-12 NOTE — Progress Notes (Signed)
Called by RN re: nausea, chest pain and decreased UOP  VS BP 108/65  Pulse 104  Temp(Src) 97.3 F (36.3 C) (Oral)  Resp 20  Ht 5' 8.5" (1.74 m)  Wt 345 lb 4.8 oz (156.627 kg)  BMI 51.73 kg/m2  SpO2 92%  Pt c/o nausea - Zofran added Pt c/o chest pain - ECG performed, not acute, continue Vicodin PRN Decreased UOP - pt has voided 750 cc all night, wt not trending down, requested pt try to urinate, advise CHF team of results.

## 2014-04-12 NOTE — Progress Notes (Signed)
Advanced Heart Failure Rounding Note   Subjective:    Steven Wyatt is a 38 yo male with a history of NICM (nor cors Oct 2014), chronic systolic HF ECHO 07/3436 EF 20% on chronic home milrinone via PICC, morbid obesity, OSA on BIPAP, HTN, CKD uncontrolled DM, and HLD. He has been followed closely in HF clinic chronic home Milrinone 0.375 mcg. Most recent admit 4/8 due to rectal abscess. Milrinone was cut back to 0.25 mcg during this admit due to elevated CO-OX. He had I & D performed 03/31/14. Discharge weight was 330 pounds. On 04/07/14 creatinine was up from 1.7 to 2.3 and he was instructed to hold bumex for 3 days and stop spiro. He says his weight has trending up from 330-345 pounds. He says his urine output has decreased since Milrinone was cut back to 0.25 mcg.   Admitted with chest pain and nausea.   CEs negative. Started on lasix drip at 8 mg per hour as he continued on Milrinone at 0.25 mcg.  CO-OX was 36% and Milrinone was increased to 0.375 mcg. Also carvedilol stopped due to low output. Today Milrinone 68%. Weight up 2 pounds. Able to ambulate 400 feet.   Denies SOB/Orthopnea/CP   CO-OX 36>68% Creatinine 1.9> 2.08   Objective:   Weight Range:  Vital Signs:   Temp:  [97.3 F (36.3 C)-97.9 F (36.6 C)] 97.3 F (36.3 C) (04/22 0438) Pulse Rate:  [104-109] 108 (04/22 0641) Resp:  [20] 20 (04/22 0438) BP: (101-112)/(65-96) 102/76 mmHg (04/22 0641) SpO2:  [92 %-100 %] 92 % (04/22 0438) Weight:  [345 lb 4.8 oz (156.627 kg)] 345 lb 4.8 oz (156.627 kg) (04/22 0438) Last BM Date: 04/10/14  Weight change: Filed Weights   04/10/14 2311 04/11/14 0511 04/12/14 0438  Weight: 343 lb 1.6 oz (155.629 kg) 343 lb 9.6 oz (155.856 kg) 345 lb 4.8 oz (156.627 kg)    Intake/Output:   Intake/Output Summary (Last 24 hours) at 04/12/14 0732 Last data filed at 04/12/14 0554  Gross per 24 hour  Intake 2265.32 ml  Output   2200 ml  Net  65.32 ml     Physical Exam: General:  Obese male. Well  appearing. No resp difficulty. Sitting on the side of the bed.  HEENT: normal Neck: supple. JVP difficult to assess due to body habitus   Carotids 2+ bilat; no bruits. No lymphadenopathy or thryomegaly appreciated. Cor: PMI nondisplaced. Tachycardic Regular rate & rhythm. No rubs, gallops or murmurs. Lungs: clear Abdomen: Obese, soft, nontender, nondistended. No hepatosplenomegaly. No bruits or masses. Good bowel sounds. Extremities: no cyanosis, clubbing, rash, edema RUE PICC Neuro: alert & orientedx3, cranial nerves grossly intact. moves all 4 extremities w/o difficulty. Affect pleasant  Telemetry: ST  Labs: Basic Metabolic Panel:  Recent Labs Lab 04/10/14 1834 04/10/14 2050 04/11/14 0443 04/12/14 0445  NA 137  --  137 137  K 2.9*  --  3.2* 3.8  CL 92*  --  93* 94*  CO2 29  --  28 28  GLUCOSE 133*  --  133* 150*  BUN 21  --  21 22  CREATININE 1.86*  --  1.92* 2.08*  CALCIUM 8.8  --  8.8 8.7  MG  --  1.5 1.9  --     Liver Function Tests: No results found for this basename: AST, ALT, ALKPHOS, BILITOT, PROT, ALBUMIN,  in the last 168 hours No results found for this basename: LIPASE, AMYLASE,  in the last 168 hours No results found for  this basename: AMMONIA,  in the last 168 hours  CBC:  Recent Labs Lab 04/10/14 1834 04/11/14 0443  WBC 8.0 7.6  NEUTROABS  --  5.4  HGB 11.1* 10.4*  HCT 33.5* 31.7*  MCV 76.8* 77.1*  PLT 288 320    Cardiac Enzymes: No results found for this basename: CKTOTAL, CKMB, CKMBINDEX, TROPONINI,  in the last 168 hours  BNP: BNP (last 3 results)  Recent Labs  01/03/14 1248 01/11/14 1121 04/10/14 1859  PROBNP 1164.0* 2156.0* 1531.0*     Other results:  Imaging: Dg Chest 2 View  04/10/2014   CLINICAL DATA:  Left-sided chest pain and shortness of breath for 3 days.  EXAM: CHEST  2 VIEW  COMPARISON:  02/24/2014  FINDINGS: Cardiac enlargement with pulmonary vascular congestion. Vascular congestion is increasing and there is interval  development of interstitial edema and small bilateral pleural effusions since the previous study. Right PICC line remains unchanged in position.  IMPRESSION: Cardiac enlargement with increasing pulmonary vascular congestion and developing edema.   Electronically Signed   By: Burman Nieves M.D.   On: 04/10/2014 21:58     Medications:     Scheduled Medications: . atorvastatin  80 mg Oral Daily  . digoxin  0.125 mg Oral Daily  . furosemide  80 mg Intravenous Once  . heparin  5,000 Units Subcutaneous 3 times per day  . hydrALAZINE  25 mg Oral 3 times per day  . insulin aspart  0-9 Units Subcutaneous TID WC  . insulin aspart  5 Units Subcutaneous TID WC  . insulin glargine  25 Units Subcutaneous QHS  . isosorbide mononitrate  30 mg Oral Daily  . potassium chloride  40 mEq Oral TID  . sodium chloride  3 mL Intravenous Q12H    Infusions: . furosemide (LASIX) infusion 15 mg/hr (04/11/14 1952)  . milrinone 0.375 mcg/kg/min (04/12/14 0639)    PRN Medications: sodium chloride, albuterol, HYDROcodone-acetaminophen, ondansetron (ZOFRAN) IV, sodium chloride, sodium chloride   Assessment:   1. Chest Pain  2. Chronic Systolic Heart Failure- on home milrinone 0.25 mcg  3. OAS- on BiPap  4. DM  5. Morbid Obesity  6. Hypokalemia 7. S/P I & D perirectal abscess 03/31/14  8. CKD- creatinine 1.9>2.08   Plan/Discussion:   Yesterday Milrinone was increased to 0.375 due to low CO-OX 36%. Today CO-OX improved to 68%. Keep off carvedilol. Continue digoxin. (dig level 0.5). No beta blocker due to low output. Volume status difficult to assess but with creatinine trending up will need to stop lasix drip. Recheck BMET at 1:00 today. Continue current dose of hydralazine/imdur. No Ace/spiro due to CKD. Consider transfer to SDU for CVP but will keep on telemetry for now.   Cardiac rehab following.   Will need to resume [facility] when discharged. Milrinone at 0.375 mcg with weekly BMETs.   Length of Stay:  2 Amy D Clegg NP-C  04/12/2014, 7:32 AM  Advanced Heart Failure Team Pager [phone] (M-F; 7a - 4p)  Please contact [facility] for night-coverage after hours (4p -7a ) and weekends on [email or web address]  Patient seen with NP, agree with the above note.  He is feeling better today on higher milrinone, co-ox better.  Leave off diuretic for today, follow creatinine tomorrow.  Will likely restart prior home Bumex dose.  He remains inotrope-dependent.  Will need evaluation for CRT-D to see if CRT enables him to get off milrinone.  If not, LVAD is a consideration.    Had I & D  of buttocks abscess not long ago, needs packing removed today.  Will get wound nurse to see.  Steven Wyatt 04/12/2014 8:33 AM

## 2014-04-12 NOTE — Consult Note (Signed)
Subjective: 38 y/o obese AA male with a long list of medical problems including CHF who presented to [facility] on 03/10/14 for evaluation of chest pain and palpitations.  He recently had surgery by Dr. Derrell Lolling on 03/31/14 for a perirectal abscess.  He was to be seen in the office last week, but it was rescheduled for this week, but he was subsequently admitted to the hospital for heart failure. He says "the packing is still in there".  He notes improvement in the pain and infection.  He finished a course of antibiotics at home.  He says he had a lot of purulent drainage at first, but that has resolved.    Objective: Vital signs in last 24 hours: Temp:  [97.3 F (36.3 C)-97.9 F (36.6 C)] 97.3 F (36.3 C) (04/22 0438) Pulse Rate:  [104-115] 115 (04/22 0959) Resp:  [20] 20 (04/22 0438) BP: (101-126)/(65-96) 126/76 mmHg (04/22 0959) SpO2:  [92 %-100 %] 92 % (04/22 0438) Weight:  [345 lb 4.8 oz (156.627 kg)] 345 lb 4.8 oz (156.627 kg) (04/22 0438) Last BM Date: 04/10/14  Intake/Output from previous day: 04/21 0701 - 04/22 0700 In: 2265.3 [P.O.:1560; I.V.:705.3] Out: 2200 [Urine:2200] Intake/Output this shift: Total I/O In: 120 [P.O.:120] Out: 450 [Urine:450]  PE: Gen:  Alert, NAD, pleasant Perirectal: left perirectal tissue with non erythematous induration around the 2 incision sites where the penrose drain remains in place.  No purulent drainage.  Lab Results:   Recent Labs  04/10/14 1834 04/11/14 0443  WBC 8.0 7.6  HGB 11.1* 10.4*  HCT 33.5* 31.7*  PLT 288 320   BMET  Recent Labs  04/11/14 0443 04/12/14 0445  NA 137 137  K 3.2* 3.8  CL 93* 94*  CO2 28 28  GLUCOSE 133* 150*  BUN 21 22  CREATININE 1.92* 2.08*  CALCIUM 8.8 8.7   PT/INR No results found for this basename: LABPROT, INR,  in the last 72 hours CMP     Component Value Date/Time   NA 137 04/12/2014 0445   K 3.8 04/12/2014 0445   CL 94* 04/12/2014 0445   CO2 28 04/12/2014 0445   GLUCOSE 150* 04/12/2014  0445   BUN 22 04/12/2014 0445   CREATININE 2.08* 04/12/2014 0445   CREATININE 2.00* 03/24/2014 1630   CALCIUM 8.7 04/12/2014 0445   PROT 8.9* 03/31/2014 1140   ALBUMIN 2.9* 03/31/2014 1140   AST 23 03/31/2014 1140   ALT 10 03/31/2014 1140   ALKPHOS 98 03/31/2014 1140   BILITOT 0.8 03/31/2014 1140   GFRNONAA 40* 04/12/2014 0445   GFRAA 46* 04/12/2014 0445   Lipase  No results found for this basename: lipase       Studies/Results: Dg Chest 2 View  04/10/2014   CLINICAL DATA:  Left-sided chest pain and shortness of breath for 3 days.  EXAM: CHEST  2 VIEW  COMPARISON:  02/24/2014  FINDINGS: Cardiac enlargement with pulmonary vascular congestion. Vascular congestion is increasing and there is interval development of interstitial edema and small bilateral pleural effusions since the previous study. Right PICC line remains unchanged in position.  IMPRESSION: Cardiac enlargement with increasing pulmonary vascular congestion and developing edema.   Electronically Signed   By: Burman Nieves M.D.   On: 04/10/2014 21:58    Anti-infectives: Anti-infectives   None       Assessment/Plan POD #13 s/p I&D for left perirectal abscess Derrell Lolling - 03/31/14)  Plan: 1.  The Penrose drain can likely come out, but will check with Dr. Magnus Ivan  given the hard area of non-erythematous induration or scarring around one of the incisions sites 2.  Dry dressing, sitz bath or showers after BM's and as needed 3.  Can follow up with Dr. Derrell Lollingamirez once discharged from the hospital   LOS: 2 days    [facility] Dort 04/12/2014, 11:58 AM Pager: [phone]

## 2014-04-12 NOTE — Progress Notes (Signed)
Afternoon BMET reviewed.   Creatinine coming down. Potassium ok. Magnesium 1.6 Give 2 grams IV magnesium now.   Amy D Clegg NP-C    2:29 PM

## 2014-04-12 NOTE — Consult Note (Signed)
I have seen and examined the patient and agree with the assessment and plans.  Then penrose can come out and he can follow-up in our office  Louie Meaders A. Magnus Ivan  MD, FACS

## 2014-04-12 NOTE — Progress Notes (Signed)
Pt a/o, pt had 30 beat run of Vtach at 1108 PA Amy notified, coreg given as ordered, Mg repleated, pt c/o pain PRN Vicodin given as ordered, vss, pt stable

## 2014-04-12 NOTE — Progress Notes (Signed)
I cosign with Demarcus Steward on all documentation and medication administration for this shift.  

## 2014-04-12 NOTE — Discharge Instructions (Signed)
Heart Failure Heart failure means your heart has trouble pumping blood. This makes it hard for your body to work well. Heart failure is usually a long-term (chronic) condition. You must take good care of yourself and follow your doctor's treatment plan. HOME CARE  Take your heart medicine as told by your doctor.  Do not stop taking medicine unless your doctor tells you to.  Do not skip any dose of medicine.  Refill your medicines before they run out.  Take other medicines only as told by your doctor or pharmacist.  Stay active if told by your doctor. The elderly and people with severe heart failure should talk with a doctor about physical activity.  Eat heart healthy foods. Choose foods that are without trans fat and are low in saturated fat, cholesterol, and salt (sodium). This includes fresh or frozen fruits and vegetables, fish, lean meats, fat-free or low-fat dairy foods, whole grains, and high-fiber foods. Lentils and dried peas and beans (legumes) are also good choices.  Limit salt if told by your doctor.  Cook in a healthy way. Roast, grill, broil, bake, poach, steam, or stir-fry foods.  Limit fluids as told by your doctor.  Weigh yourself every morning. Do this after you pee (urinate) and before you eat breakfast. Write down your weight to give to your doctor.  Take your blood pressure and write it down if your doctor tell you to.  Ask your doctor how to check your pulse. Check your pulse as told.  Lose weight if told by your doctor.  Stop smoking or chewing tobacco. Do not use gum or patches that help you quit without your doctor's approval.  Schedule and go to doctor visits as told.  Nonpregnant women should have no more than 1 drink a day. Men should have no more than 2 drinks a day. Talk to your doctor about drinking alcohol.  Stop illegal drug use.  Stay current with shots (immunizations).  Manage your health conditions as told by your doctor.  Learn to manage  your stress.  Rest when you are tired.  If it is really hot outside:  Avoid intense activities.  Use air conditioning or fans, or get in a cooler place.  Avoid caffeine and alcohol.  Wear loose-fitting, lightweight, and light-colored clothing.  If it is really cold outside:  Avoid intense activities.  Layer your clothing.  Wear mittens or gloves, a hat, and a scarf when going outside.  Avoid alcohol.  Learn about heart failure and get support as needed.  Get help to maintain or improve your quality of life and your ability to care for yourself as needed. GET HELP IF:   You gain 03 lb/1.4 kg or more in 1 day or 05 lb/2.3 kg in a week.  You are more short of breath than usual.  You cannot do your normal activities.  You tire easily.  You cough more than normal, especially with activity.  You have any or more puffiness (swelling) in areas such as your hands, feet, ankles, or belly (abdomen).  You cannot sleep because it is hard to breathe.  You feel like your heart is beating fast (palpitations).  You get dizzy or lightheaded when you stand up. GET HELP RIGHT AWAY IF:   You have trouble breathing.  There is a change in mental status, such as becoming less alert or not being able to focus.  You have chest pain or discomfort.  You faint. MAKE SURE YOU:   Understand these   instructions.  Will watch your condition.  Will get help right away if you are not doing well or get worse. Document Released: 09/16/2008 Document Revised: 04/04/2013 Document Reviewed: 07/08/2012 Exit[employer] Patient Information 2014 [employer].  

## 2014-04-12 NOTE — Progress Notes (Signed)
Received call from nursing staff. 30 beast NSVT at 11:08  Asymptomatic at the time. Discussed with Dr Shirlee Latch. Restart carvedilol 3.125 mg twice a day. Check magnesium.   Will need lifevest prior to discharge.   Aisea Bouldin D Melisa Donofrio NP-C 11:51 AM

## 2014-04-13 DIAGNOSIS — I472 Ventricular tachycardia, unspecified: Secondary | ICD-10-CM

## 2014-04-13 DIAGNOSIS — R079 Chest pain, unspecified: Secondary | ICD-10-CM

## 2014-04-13 LAB — CARBOXYHEMOGLOBIN
Carboxyhemoglobin: 1.4 % (ref 0.5–1.5)
Methemoglobin: 0.7 % (ref 0.0–1.5)
O2 Saturation: 65 %
Total hemoglobin: 9.8 g/dL — ABNORMAL LOW (ref 13.5–18.0)

## 2014-04-13 LAB — GLUCOSE, CAPILLARY
GLUCOSE-CAPILLARY: 120 mg/dL — AB (ref 70–99)
Glucose-Capillary: 183 mg/dL — ABNORMAL HIGH (ref 70–99)
Glucose-Capillary: 141 mg/dL — ABNORMAL HIGH (ref 70–99)
Glucose-Capillary: 120 mg/dL — ABNORMAL HIGH (ref 70–99)

## 2014-04-13 LAB — BASIC METABOLIC PANEL
BUN: 22 mg/dL (ref 6–23)
CALCIUM: 8.9 mg/dL (ref 8.4–10.5)
CHLORIDE: 93 meq/L — AB (ref 96–112)
CO2: 27 mEq/L (ref 19–32)
CREATININE: 1.88 mg/dL — AB (ref 0.50–1.35)
GFR calc Af Amer: 52 mL/min — ABNORMAL LOW (ref >90)
GFR calc non Af Amer: 45 mL/min — ABNORMAL LOW (ref >90)
GLUCOSE: 128 mg/dL — AB (ref 70–99)
Potassium: 3.8 mEq/L (ref 3.7–5.3)
Sodium: 135 mEq/L — ABNORMAL LOW (ref 137–147)

## 2014-04-13 LAB — TROPONIN I

## 2014-04-13 LAB — HEMOGLOBIN A1C
HEMOGLOBIN A1C: 13.7 % — AB (ref <5.7)
MEAN PLASMA GLUCOSE: 346 mg/dL — AB (ref <117)

## 2014-04-13 MED ORDER — TRAMADOL HCL 50 MG PO TABS
100.0000 mg | ORAL_TABLET | Freq: Once | ORAL | Status: AC
  Administered 2014-04-13: 100 mg via ORAL
  Filled 2014-04-13: qty 2

## 2014-04-13 MED ORDER — TRAMADOL HCL 50 MG PO TABS
50.0000 mg | ORAL_TABLET | Freq: Once | ORAL | Status: DC
  Filled 2014-04-13: qty 1

## 2014-04-13 MED ORDER — MAGNESIUM SULFATE 40 MG/ML IJ SOLN
2.0000 g | Freq: Once | INTRAMUSCULAR | Status: AC
  Administered 2014-04-13: 2 g via INTRAVENOUS
  Filled 2014-04-13: qty 50

## 2014-04-13 MED ORDER — BUMETANIDE 2 MG PO TABS
4.0000 mg | ORAL_TABLET | Freq: Two times a day (BID) | ORAL | Status: DC
  Administered 2014-04-13 – 2014-04-16 (×6): 4 mg via ORAL
  Filled 2014-04-13 (×8): qty 2

## 2014-04-13 NOTE — Progress Notes (Addendum)
Advanced Heart Failure Rounding Note   Subjective:    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 He has been followed closely in HF clinic chronic home Milrinone 0.375 mcg. Most recent admit 4/8 due to rectal abscess. Milrinone was cut back to 0.25 mcg during this admit due to elevated CO-OX. He had I & D performed 03/31/14. Discharge weight was 330 pounds. On 04/07/14 creatinine was up from 1.7 to 2.3 and he was instructed to hold bumex for 3 days and stop spiro. He says his weight has trending up from 330-345 pounds. He says his urine output has decreased since Milrinone was cut back to 0.25 mcg.   Admitted with chest pain and nausea.   CEs negative. Started on lasix drip at 8 mg per hour as he continued on Milrinone at 0.25 mcg.  CO-OX was 36% and Milrinone was increased to 0.375 mcg. CO-OX improved to 68%.  Yesterday he had 30 beats NSVT. Carvedilol was restarted and he was given 2 grams of magnesium. Over night he had chest pain that was relieved with tramadol. Weight down 3 pounds. Denies CP with ambulation.   Denies SOB/Orthopnea.  Feels good this am.   CO-OX 36>68>65% Creatinine 1.9> 2.08>1.88   Objective:   Weight Range:  Vital Signs:   Temp:  [97.5 F (36.4 C)-98 F (36.7 C)] 97.5 F (36.4 C) (04/23 0451) Pulse Rate:  [76-115] 77 (04/23 0451) Resp:  [18-22] 22 (04/23 0451) BP: (109-156)/(74-96) 129/86 mmHg (04/23 0451) SpO2:  [92 %-100 %] 100 % (04/23 0451) Weight:  [342 lb 8.8 oz (155.38 kg)] 342 lb 8.8 oz (155.38 kg) (04/23 0451) Last BM Date: 04/10/14  Weight change: Filed Weights   04/11/14 0511 04/12/14 0438 04/13/14 0451  Weight: 343 lb 9.6 oz (155.856 kg) 345 lb 4.8 oz (156.627 kg) 342 lb 8.8 oz (155.38 kg)    Intake/Output:   Intake/Output Summary (Last 24 hours) at 04/13/14 0800 Last data filed at  04/13/14 0452  Gross per 24 hour  Intake    420 ml  Output   1650 ml  Net  -1230 ml     Physical Exam: General:  Obese male. Well appearing. No resp difficulty. Sitting on the side of the bed.  HEENT: normal Neck: supple. JVP difficult to assess due to body habitus   Carotids 2+ bilat; no bruits. No lymphadenopathy or thryomegaly appreciated. Cor: PMI nondisplaced.Regular rate & rhythm. No rubs, gallops or murmurs. Lungs: clear Abdomen: Obese, soft, nontender, nondistended. No hepatosplenomegaly. No bruits or masses. Good bowel sounds. Extremities: no cyanosis, clubbing, rash, edema RUE PICC Neuro: alert & orientedx3, cranial nerves grossly intact. moves all 4 extremities w/o difficulty. Affect pleasant  Telemetry: Sinus rhythm  Labs: Basic Metabolic Panel:  Recent Labs Lab 04/10/14 1834 04/10/14 2050 04/11/14 0443 04/12/14 0445 04/12/14 1256 04/13/14 0445  NA 137  --  137 137 135* 135*  K 2.9*  --  3.2* 3.8 4.1 3.8  CL 92*  --  93* 94* 92* 93*  CO2 29  --  28 28 28 27   GLUCOSE 133*  --  133* 150* 123* 128*  BUN 21  --  21 22 22 22   CREATININE 1.86*  --  1.92* 2.08* 1.97* 1.88*  CALCIUM 8.8  --  8.8 8.7 9.0 8.9  MG  --  1.5 1.9  --  1.6  --     Liver Function Tests: No results found for this basename: AST, ALT, ALKPHOS, BILITOT, PROT, ALBUMIN,  in the last 168 hours No results found for this basename: LIPASE, AMYLASE,  in the last 168 hours No results found for this basename: AMMONIA,  in the last 168 hours  CBC:  Recent Labs Lab 04/10/14 1834 04/11/14 0443  WBC 8.0 7.6  NEUTROABS  --  5.4  HGB 11.1* 10.4*  HCT 33.5* 31.7*  MCV 76.8* 77.1*  PLT 288 320    Cardiac Enzymes:  Recent Labs Lab 04/13/14 0306  TROPONINI <0.30    BNP: BNP (last 3 results)  Recent Labs  01/03/14 1248 01/11/14 1121 04/10/14 1859  PROBNP 1164.0* 2156.0* 1531.0*     Other results:  Imaging: No results found.   Medications:     Scheduled Medications: .  atorvastatin  80 mg Oral Daily  . carvedilol  3.125 mg Oral BID WC  . digoxin  0.125 mg Oral Daily  . heparin  5,000 Units Subcutaneous 3 times per day  . hydrALAZINE  25 mg Oral 3 times per day  . insulin aspart  0-9 Units Subcutaneous TID WC  . insulin aspart  5 Units Subcutaneous TID WC  . insulin glargine  25 Units Subcutaneous QHS  . isosorbide mononitrate  30 mg Oral Daily  . magnesium oxide  400 mg Oral BID  . potassium chloride  40 mEq Oral BID  . sodium chloride  3 mL Intravenous Q12H    Infusions: . milrinone 0.375 mcg/kg/min (04/13/14 0526)    PRN Medications: sodium chloride, albuterol, HYDROcodone-acetaminophen, ondansetron (ZOFRAN) IV, sodium chloride, sodium chloride   Assessment:   1. Chest Pain  2. Chronic Systolic Heart Failure- on home milrinone 0.25 mcg  3. OAS- on BiPap  4. DM  5. Morbid Obesity  6. Hypokalemia 7. S/P I & D perirectal abscess 03/31/14  8. CKD- creatinine 1.9>2.08 9. NSVT 10. Hypomagnesemia 11. Hypokalemia    Plan/Discussion:   Recurrent chest pain. Had [facility]  Oct 2014 normal cors.   Continue Milrinone 0.375. CO-OX 65%. Continue carvedilol due to NSVT.  Continue digoxin. (dig level 0.5). Volume status stable. Restart bumex tonight.  Continue current dose of hydralazine/imdur. No Ace/spiro due to CKD. Has follow up with EP May 5 possible CRT-D. Life Vest to be placed prior to discharge.    Will need to resume [facility] when discharged. Milrinone at 0.375 mcg with weekly BMETs.   Hold d/c due to recurrent chest pain.   Length of Stay: 3 Amy D Clegg NP-C  04/13/2014, 8:00 AM  Advanced Heart Failure Team Pager [phone] (M-F; 7a - 4p)  Please contact [facility] for night-coverage after hours (4p -7a ) and weekends on [email or web address]   Patient seen and examined with Tonye Becket, NP. We discussed all aspects of the encounter. I agree with the assessment and plan as stated above. Much improved from a HF point of view with higher dose of  milrinone. Cr better with holding diuretics. Will restart today.Looks euvolemic. CP likely noncardiac. LifeVest ordered due to NSVT. Will need CRT-D once peri-rectal abscess completely healed. Hopefully home in am.   Will give another 2g mag and recheck tomorrow.   Bevelyn Buckles Koleman Marling,MD 8:56 AM

## 2014-04-13 NOTE — Progress Notes (Signed)
RT spoke with patient about wearing the BiPAP and patient stated that he doesn't need any help with applying the mask. RT advised patient to give RT a call if he needed any assistance.

## 2014-04-13 NOTE — Progress Notes (Signed)
Patient states he is having severe sharp chest pain and was requesting to page the doctor. Ekg was done. VSS. Offered PRN viocodin for he had that previously, or nitroglycerin tab and patient refused both for he states they both do not work for the kind of pain he is having at that moment. Paged on-call doctor- Dr. Adolm Joseph and notified him of patient having severe chest pain. Dr. Adolm Joseph gave orders for PO Tramadol 100mg  and for labs to be drawn. Tramadol given as ordered. After 30 min of administration of med, asked patient how he felt. Pt states he is still having palpitations. Paged the doctor once more to see if Xanax would help patient to relax and to help resolve palpitations which may help to relieve the chest pain. Doctor suggested to wait for the doctors to round on patient and evaluated due to patient's renal insufficiency and polypharmacy of different doctors treating the pain with different medications. Explained to patient and wife and they understood.

## 2014-04-13 NOTE — Progress Notes (Signed)
Pt states he does not need any assistance with Bipap or mask application. Pt was made aware if he should need any assistance throughout the night to contact RT.

## 2014-04-13 NOTE — Plan of Care (Signed)
Problem: Phase I Progression Outcomes Goal: Pain controlled with appropriate interventions Outcome: Progressing Patient continues to have pain issues that have not been resolved.

## 2014-04-13 NOTE — Progress Notes (Signed)
Pt wife helping pt bathe. Pt sts he can walk on his own later today. Brief review of ed and gave Lifevest video to watch however video system has not been working lately. Will f/u tomorrow. Ethelda Chick CES, ACSM 1:44 PM 04/13/2014

## 2014-04-14 ENCOUNTER — Encounter: Payer: Self-pay | Admitting: Internal Medicine

## 2014-04-14 LAB — CARBOXYHEMOGLOBIN
CARBOXYHEMOGLOBIN: 1.7 % — AB (ref 0.5–1.5)
Carboxyhemoglobin: 2 % — ABNORMAL HIGH (ref 0.5–1.5)
METHEMOGLOBIN: 1 % (ref 0.0–1.5)
METHEMOGLOBIN: 1 % (ref 0.0–1.5)
O2 Saturation: 41.5 %
O2 Saturation: 61 %
TOTAL HEMOGLOBIN: 10.7 g/dL — AB (ref 13.5–18.0)
Total hemoglobin: 9.7 g/dL — ABNORMAL LOW (ref 13.5–18.0)

## 2014-04-14 LAB — GLUCOSE, CAPILLARY
GLUCOSE-CAPILLARY: 104 mg/dL — AB (ref 70–99)
GLUCOSE-CAPILLARY: 155 mg/dL — AB (ref 70–99)
GLUCOSE-CAPILLARY: 136 mg/dL — AB (ref 70–99)
GLUCOSE-CAPILLARY: 130 mg/dL — AB (ref 70–99)

## 2014-04-14 LAB — BASIC METABOLIC PANEL
BUN: 22 mg/dL (ref 6–23)
CHLORIDE: 93 meq/L — AB (ref 96–112)
CO2: 27 mEq/L (ref 19–32)
CREATININE: 1.88 mg/dL — AB (ref 0.50–1.35)
Calcium: 9.1 mg/dL (ref 8.4–10.5)
GFR calc non Af Amer: 45 mL/min — ABNORMAL LOW (ref >90)
GFR, EST AFRICAN AMERICAN: 52 mL/min — AB (ref >90)
Glucose, Bld: 153 mg/dL — ABNORMAL HIGH (ref 70–99)
Potassium: 4.2 mEq/L (ref 3.7–5.3)
Sodium: 134 mEq/L — ABNORMAL LOW (ref 137–147)

## 2014-04-14 LAB — MAGNESIUM: Magnesium: 2.1 mg/dL (ref 1.5–2.5)

## 2014-04-14 MED ORDER — TRAMADOL HCL 50 MG PO TABS
50.0000 mg | ORAL_TABLET | Freq: Once | ORAL | Status: AC
  Administered 2014-04-14: 50 mg via ORAL
  Filled 2014-04-14: qty 1

## 2014-04-14 MED ORDER — PANTOPRAZOLE SODIUM 40 MG PO TBEC
40.0000 mg | DELAYED_RELEASE_TABLET | Freq: Every day | ORAL | Status: DC
  Administered 2014-04-14 – 2014-04-16 (×3): 40 mg via ORAL
  Filled 2014-04-14 (×3): qty 1

## 2014-04-14 MED ORDER — TRAMADOL HCL 50 MG PO TABS
100.0000 mg | ORAL_TABLET | Freq: Four times a day (QID) | ORAL | Status: DC | PRN
  Administered 2014-04-16: 100 mg via ORAL
  Filled 2014-04-14 (×2): qty 2

## 2014-04-14 MED ORDER — GI COCKTAIL ~~LOC~~
30.0000 mL | Freq: Two times a day (BID) | ORAL | Status: DC | PRN
  Filled 2014-04-14: qty 30

## 2014-04-14 MED ORDER — TRAMADOL HCL 50 MG PO TABS
50.0000 mg | ORAL_TABLET | Freq: Four times a day (QID) | ORAL | Status: DC | PRN
  Administered 2014-04-14: 50 mg via ORAL
  Filled 2014-04-14: qty 1

## 2014-04-14 NOTE — Progress Notes (Signed)
[id number] Cardiac Rehab Pt states that he walked 45 minutes ago in hall. He denies any problems walking and states that he did not have any pain. He does state that he had chest pain again last night and that he does not want to go home having this pain. Ask pt if he watched life vest video yesterday and he states no. I wrote down for him how to get the video on and also to watch CHF video for review. Pt states that he will walk in hall again around 4 pm. We will follow pt tomorrow. Beatrix Fetters, RN 04/14/2014 11:27 AM

## 2014-04-14 NOTE — Progress Notes (Signed)
Patient ID: Steven CarotaWayne O Creeden Jr., male   DOB: Feb 01, 1976, 38 y.o.   MRN: [id number] Advanced Heart Failure Rounding Note   Subjective:    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 He has been followed closely in HF clinic chronic home Milrinone 0.375 mcg. Most recent admit 4/8 due to rectal abscess. Milrinone was cut back to 0.25 mcg during this admit due to elevated CO-OX. He had I & D performed 03/31/14. Discharge weight was 330 pounds. On 04/07/14 creatinine was up from 1.7 to 2.3 and he was instructed to hold bumex for 3 days and stop spiro. He says his weight has trending up from 330-345 pounds. He says his urine output has decreased since Milrinone was cut back to 0.25 mcg.   Admitted with chest pain and nausea.  CEs negative. Started on lasix drip at 8 mg per hour as he continued on Milrinone at 0.25 mcg.  CO-OX was 36% and Milrinone was increased to 0.375 mcg. Also carvedilol stopped due to low output. Diuretics held with rising creatinine then restarted yesterday night.  Yesterday coox 68%, now down to 41%. Weight up again. Able to ambulate 400 feet.   Denies SOB/Orthopnea/CP  CO-OX 36>68%>41% Creatinine 1.9> 2.08>1.88>pending   Objective:   Weight Range:  Vital Signs:   Temp:  [97.3 F (36.3 C)-97.9 F (36.6 C)] 97.8 F (36.6 C) (04/24 0622) Pulse Rate:  [99-103] 99 (04/24 0622) Resp:  [18-24] 24 (04/24 0622) BP: (104-125)/(67-97) 123/97 mmHg (04/24 0622) SpO2:  [97 %-99 %] 98 % (04/24 0622) Weight:  [158.9 kg (350 lb 5 oz)] 158.9 kg (350 lb 5 oz) (04/24 0622) Last BM Date: 04/10/14  Weight change: Filed Weights   04/12/14 0438 04/13/14 0451 04/14/14 0622  Weight: 156.627 kg (345 lb 4.8 oz) 155.38 kg (342 lb 8.8 oz) 158.9 kg (350 lb 5 oz)    Intake/Output:   Intake/Output Summary (Last 24 hours) at 04/14/14  0747 Last data filed at 04/14/14 [id number]  Gross per 24 hour  Intake 1400.4 ml  Output   1750 ml  Net -349.6 ml     Physical Exam: General:  Obese male. Well appearing. No resp difficulty. Sitting on the side of the bed.  HEENT: normal Neck: supple. JVP difficult to assess due to body habitus   Carotids 2+ bilat; no bruits. No lymphadenopathy or thryomegaly appreciated. Cor: PMI nondisplaced. Tachycardic Regular rate & rhythm. No rubs, gallops or murmurs. Lungs: clear Abdomen: Obese, soft, nontender, nondistended. No hepatosplenomegaly. No bruits or masses. Good bowel sounds. Extremities: no cyanosis, clubbing, rash, edema RUE PICC Neuro: alert & orientedx3, cranial nerves grossly intact. moves all 4 extremities w/o difficulty. Affect pleasant  Telemetry: ST  Labs: Basic Metabolic Panel:  Recent Labs Lab 04/10/14 1834 04/10/14 2050 04/11/14 0443 04/12/14 0445 04/12/14 1256 04/13/14 0445 04/14/14 0510  NA 137  --  137 137 135* 135*  --   K 2.9*  --  3.2* 3.8 4.1 3.8  --   CL 92*  --  93* 94* 92* 93*  --   CO2 29  --  28 28 28 27   --   GLUCOSE 133*  --  133* 150* 123* 128*  --   BUN 21  --  21 22 22 22   --   CREATININE 1.86*  --  1.92* 2.08* 1.97* 1.88*  --   CALCIUM 8.8  --  8.8 8.7 9.0 8.9  --   MG  --  1.5 1.9  --  1.6  --  2.1    Liver Function Tests: No results found for this basename: AST, ALT, ALKPHOS, BILITOT, PROT, ALBUMIN,  in the last 168 hours No results found for this basename: LIPASE, AMYLASE,  in the last 168 hours No results found for this basename: AMMONIA,  in the last 168 hours  CBC:  Recent Labs Lab 04/10/14 1834 04/11/14 0443  WBC 8.0 7.6  NEUTROABS  --  5.4  HGB 11.1* 10.4*  HCT 33.5* 31.7*  MCV 76.8* 77.1*  PLT 288 320    Cardiac Enzymes:  Recent Labs Lab 04/13/14 0306  TROPONINI <0.30    BNP: BNP (last 3 results)  Recent Labs  01/03/14 1248 01/11/14 1121 04/10/14 1859  PROBNP 1164.0* 2156.0* 1531.0*     Other  results:  Imaging: No results found.   Medications:     Scheduled Medications: . atorvastatin  80 mg Oral Daily  . bumetanide  4 mg Oral BID  . carvedilol  3.125 mg Oral BID WC  . digoxin  0.125 mg Oral Daily  . heparin  5,000 Units Subcutaneous 3 times per day  . hydrALAZINE  25 mg Oral 3 times per day  . insulin aspart  0-9 Units Subcutaneous TID WC  . insulin aspart  5 Units Subcutaneous TID WC  . insulin glargine  25 Units Subcutaneous QHS  . isosorbide mononitrate  30 mg Oral Daily  . magnesium oxide  400 mg Oral BID  . potassium chloride  40 mEq Oral BID  . sodium chloride  3 mL Intravenous Q12H    Infusions: . milrinone 0.375 mcg/kg/min (04/14/14 0548)    PRN Medications: sodium chloride, albuterol, HYDROcodone-acetaminophen, ondansetron (ZOFRAN) IV, sodium chloride, sodium chloride   Assessment:   1. Chest Pain  2. Chronic Systolic Heart Failure- on home milrinone 0.25 mcg  3. OAS- on BiPap  4. DM  5. Morbid Obesity  6. Hypokalemia 7. S/P I & D perirectal abscess 03/31/14  8. CKD- creatinine 1.9>2.08   Plan/Discussion:   Milrinone was increased to 0.375 due to low CO-OX 36%. Yesterday CO-OX improved to 68% but 41% this morning.  Will resend because not sure this is accurate. Continue digoxin. (dig level 0.5). Continue current dose of hydralazine/imdur. No Ace/spiro due to CKD. He is back on Bumex, started last night.  Will need evaluation for CRT-D to see if CRT enables him to get off milrinone (has EP outpatient followup).  If not, LVAD is a consideration.  Had NSVT in hospital, will need Lifevest at discharge.  Cardiac rehab following.   Will need to resume [facility] when discharged. Milrinone at 0.375 mcg with weekly BMETs.   He has had chest pressure nightly x weeks.  Ruled out for MI here.  ?GERD.  Will start Protonix.   Will reassess this afternoon.  If feels well and repeat co-ox ok, will consider discharge.   Length of Stay: 4 Laurey Morale 04/14/2014, 7:47 AM

## 2014-04-14 NOTE — Progress Notes (Signed)
Inpatient Diabetes Program Recommendations  AACE/ADA: New Consensus Statement on Inpatient Glycemic Control (2013)  Target Ranges:  Prepandial:   less than 140 mg/dL      Peak postprandial:   less than 180 mg/dL (1-2 hours)      Critically ill patients:  140 - 180 mg/dL      Results for Steven Wyatt, Steven Wyatt (MRN [id number]) as of 04/14/2014 10:04  Ref. Range 04/13/2014 05:51 04/13/2014 12:01 04/13/2014 16:39 04/13/2014 21:11  Glucose-Capillary Latest Range: 70-99 mg/dL 935 (H) 701 (H) 779 (H) 120 (H)    Results for Steven Wyatt, Steven Wyatt (MRN [id number]) as of 04/14/2014 10:04  Ref. Range 04/13/2014 04:45  Hemoglobin A1C Latest Range: <5.7 % 13.7 (H)     Patient admitted with CP.  Has history of DM2, CHF, CKD 3.  Home DM Meds:  Lantus 25 units QHS + Novolog SSI   CBGs very well controlled on current in-hospital insulin regimen.  Note A1c was extremely elevated.  Patient was recently hospitalized for periectal abscess on 04/11 and underwent I&D.   Spoke to patient about his current A1c of 13.7% (04/13/14).  Explained what an A1c is and what it measures.  Reminded patient that his goal A1c is 7% or less per ADA standards to prevent both acute and long-term complications.  Patient stated he thinks his A1c is elevated b/c of his recent infection and pain.  Patient told me he sees Dr. Oliver Barre and has a follow up appointment on 04/24/14.  Patient has CBG meter at home and often checks CBGs 3-4 times per day.  Also has access to insulin and states he has no problems taking insulin.     Will follow Steven Finland RN, MSN, CDE Diabetes Coordinator Inpatient Diabetes Program Team Pager: [phone] (8a-10p)

## 2014-04-14 NOTE — Progress Notes (Signed)
Utilization Review Completed Camelia Stelzner J. Kavian Peters, RN, BSN, NCM [phone]  

## 2014-04-15 ENCOUNTER — Encounter (HOSPITAL_COMMUNITY): Payer: Self-pay | Admitting: Nurse Practitioner

## 2014-04-15 LAB — BASIC METABOLIC PANEL
BUN: 22 mg/dL (ref 6–23)
CALCIUM: 8.9 mg/dL (ref 8.4–10.5)
CHLORIDE: 95 meq/L — AB (ref 96–112)
CO2: 29 mEq/L (ref 19–32)
CREATININE: 1.95 mg/dL — AB (ref 0.50–1.35)
GFR calc non Af Amer: 43 mL/min — ABNORMAL LOW (ref >90)
GFR, EST AFRICAN AMERICAN: 50 mL/min — AB (ref >90)
Glucose, Bld: 121 mg/dL — ABNORMAL HIGH (ref 70–99)
Potassium: 4.2 mEq/L (ref 3.7–5.3)
Sodium: 138 mEq/L (ref 137–147)

## 2014-04-15 LAB — GLUCOSE, CAPILLARY
GLUCOSE-CAPILLARY: 127 mg/dL — AB (ref 70–99)
GLUCOSE-CAPILLARY: 134 mg/dL — AB (ref 70–99)
Glucose-Capillary: 111 mg/dL — ABNORMAL HIGH (ref 70–99)
Glucose-Capillary: 143 mg/dL — ABNORMAL HIGH (ref 70–99)

## 2014-04-15 LAB — CBC
HCT: 29.5 % — ABNORMAL LOW (ref 39.0–52.0)
Hemoglobin: 9.5 g/dL — ABNORMAL LOW (ref 13.0–17.0)
MCH: 25.1 pg — ABNORMAL LOW (ref 26.0–34.0)
MCHC: 32.2 g/dL (ref 30.0–36.0)
MCV: 78 fL (ref 78.0–100.0)
PLATELETS: 379 10*3/uL (ref 150–400)
RBC: 3.78 MIL/uL — AB (ref 4.22–5.81)
RDW: 19.2 % — AB (ref 11.5–15.5)
WBC: 7.2 10*3/uL (ref 4.0–10.5)

## 2014-04-15 MED ORDER — POLYETHYLENE GLYCOL 3350 17 G PO PACK
17.0000 g | PACK | Freq: Every day | ORAL | Status: DC | PRN
  Filled 2014-04-15: qty 1

## 2014-04-15 MED ORDER — POTASSIUM CHLORIDE CRYS ER 20 MEQ PO TBCR: 40.0000 meq | EXTENDED_RELEASE_TABLET | Freq: Two times a day (BID) | ORAL | Status: DC

## 2014-04-15 MED ORDER — TRAMADOL HCL 50 MG PO TABS
100.0000 mg | ORAL_TABLET | Freq: Four times a day (QID) | ORAL | Status: DC | PRN
Start: 2014-04-15 — End: 2014-05-23

## 2014-04-15 MED ORDER — MILRINONE IN DEXTROSE 20 MG/100ML IV SOLN: 0.3750 ug/kg/min | INTRAVENOUS | Status: DC

## 2014-04-15 NOTE — Discharge Summary (Addendum)
Discharge Summary   Patient ID: Steven Grime.,  MRN: [id number], DOB/AGE: 1976/03/11 38 y.o.  Admit date: 04/10/2014 Discharge date: 04/15/2014  Primary Care Provider: Oliver Barre Primary Cardiologist: D. Bensimhon, MD   Discharge Diagnoses Principal Problem:   Acute on chronic systolic CHF (congestive heart failure)  **Milrinone dose increased to 0.375 mcg/kg/hr this admission.  **Discharge weight 348 lbs.  **Net negative diuresis of 2.3 liters.  Active Problems:   NICM (nonischemic cardiomyopathy)   Morbid obesity with BMI of 50.0-59.9, adult   HTN (hypertension)   Diabetes mellitus   Acute on chronic renal failure   Sleep apnea- he reports comliance with Bi Pap   Hypokalemia   VT (ventricular tachycardia)   Stage III CKD  **CKD prevents usage of ACEI/ARB/Spironolactone.  He is on hydralazine/nitrates instead.  Allergies Allergies  Allergen Reactions  . Iodine Anaphylaxis  . Shellfish Allergy Anaphylaxis   Procedures  None  History of Present Illness  38 year old male with prior history of nonischemic cardiomyopathy and chronic stage IV congestive heart failure on home milrinone infusion. He also is morbidly obese with a history of sleep apnea, hypertension, stage III kidney disease, hyperlipidemia, and poorly controlled diabetes mellitus. He is followed closely in the congestive heart failure clinic and has been maintained on a dose of 0.25 mcg per kilogram per minute of milrinone therapy via PICC line access. He presented to the emergency room on the evening of April 20 complaints of chest pain and palpitations. His troponin was negative and his weight was found to be up 15 pounds from his previous discharge weight.  His creatinine is elevated at 1.86. In the setting of chest pain and volume overload, he was admitted for further evaluation and diuresis.  Hospital Course  Patient ruled out for myocardial infarction. Co-oximetry was low at 36% suggestive of a low  output state. Beta blocker therapy was discontinued and oral digoxin therapy was started. He was placed on a Lasix infusion. Creatinine continued to rise to a peak of 2.08, and diuretic therapy was held. Milrinone was increased to 0.375 mcg per kilogram per minute. With these changes, his creatinine did improve and his volume status is felt to be stable. His home dose of bumetanide was resumed on April 23, and his renal function has leveled off though his creatinine remains elevated at 1.95 today. His weight has been variable throughout admission, peaking at 350 pounds on April 24 after a low of 342 pounds on April 23.  His weight on April 25 is 348 pounds. On the higher dose of milrinone,co-oximetry has been trending in the low to mid 60 range.  Of note, patient did have a 30 beat run of ventricular tachycardia during admission.  It is felt that ultimately he will require cardiac resynchronization therapy and defibrillator placement however at this time, with ongoing medication changes, we have arranged for LifeVest placement.  We have resumed low-dose beta blocker therapy. He is not currently a candidate for ACE inhibitor, ARB, or aldosterone blocker therapy in the setting of acute on chronic stage III kidney disease.  Finally, patient has a history of perirectal abscess and is status post I & D with Penrose drain placement earlier this month. He was seen by general surgery during this admission for removal of Penrose drain. He will followup with surgery as scheduled.  Steven Wyatt will be discharged home this afternoon in good condition. We have her arranged for resumption of advanced home care and continuation of milrinone therapy at  0.375 mcg per kilogram per minute period he will followup in heart failure clinic on April 30 at 3 PM. We'll also arrange for electrophysiology followup on may fifth regarding possible CRT-D placement.  Addendum 04/16/2014 8:17 AM   **The patient stayed overnight on 4/25  because he did not have power @ home and thus would not be able to plug in his milrinone pump.  He has been seen and examined this AM and is stable for discharge today.  Discharge Vitals Blood pressure 127/80, pulse 103, temperature 97.7 F (36.5 C), temperature source Oral, resp. rate 22, height 5' 8.5" (1.74 m), weight 348 lb 9.6 oz (158.124 kg), SpO2 93.00%.  Filed Weights   04/13/14 0451 04/14/14 0622 04/15/14 0536  Weight: 342 lb 8.8 oz (155.38 kg) 350 lb 5 oz (158.9 kg) 348 lb 9.6 oz (158.124 kg)   Labs  CBC  Recent Labs  04/15/14 0505  WBC 7.2  HGB 9.5*  HCT 29.5*  MCV 78.0  PLT 379   Basic Metabolic Panel  Recent Labs  04/14/14 0510 04/14/14 0800 04/15/14 0505  NA  --  134* 138  K  --  4.2 4.2  CL  --  93* 95*  CO2  --  27 29  GLUCOSE  --  153* 121*  BUN  --  22 22  CREATININE  --  1.88* 1.95*  CALCIUM  --  9.1 8.9  MG 2.1  --   --    Cardiac Enzymes  Recent Labs  04/13/14 0306  TROPONINI <0.30   Hemoglobin A1C  Recent Labs  04/13/14 0445  HGBA1C 13.7*   Disposition  Pt is being discharged home today in good condition.  Follow-up Plans & Appointments  Follow-up Information   Follow up with CLEGG,AMY, NP On 04/20/2014. (Heart Failure Clinic at 3:00.[place] [address])    Specialty:  Nurse Practitioner   Contact information:   [address] [place] [address] [phone]       Follow up with Lewayne Bunting, MD On 04/25/2014. (At 12:15 PM for consultation regarding CRT-D / ICD device therapy)    Specialty:  Cardiology   Contact information:   [address] [place] [address] [phone]       Follow up with Lajean Saver, MD On 04/24/2014. (4:15)    Specialty:  General Surgery   Contact information:   [address] [place] [address] [phone]       Follow up with [facility]. (RN)    Contact information:   [address] [place] [address] [phone]         Discharge Medications    Medication List    STOP taking these medications       doxycycline 100 MG tablet  Commonly known as:  VIBRA-TABS     HYDROcodone-acetaminophen 5-325 MG per tablet  Commonly known as:  NORCO/VICODIN     metolazone 5 MG tablet  Commonly known as:  ZAROXOLYN      TAKE these medications       acetaminophen 325 MG tablet  Commonly known as:  TYLENOL  Take 325 mg by mouth every 6 (six) hours as needed for mild pain.     albuterol 108 (90 BASE) MCG/ACT inhaler  Commonly known as:  PROVENTIL HFA;VENTOLIN HFA  Inhale 2 puffs into the lungs daily as needed for wheezing or shortness of breath.     atorvastatin 80 MG tablet  Commonly  known as:  LIPITOR  Take 80 mg by mouth daily.     bumetanide 2 MG tablet  Commonly known as:  BUMEX  Take 2 tablets (4 mg total) by mouth 2 (two) times daily.     carvedilol 3.125 MG tablet  Commonly known as:  COREG  Take 1 tablet (3.125 mg total) by mouth 2 (two) times daily.     digoxin 0.125 MG tablet  Commonly known as:  LANOXIN  Take 1 tablet (0.125 mg total) by mouth daily.     hydrALAZINE 25 MG tablet  Commonly known as:  APRESOLINE  Take 1 tablet (25 mg total) by mouth every 8 (eight) hours.     insulin aspart 100 UNIT/ML FlexPen  Commonly known as:  NOVOLOG FLEXPEN  Inject 5 Units into the skin 3 (three) times daily with meals.     insulin glargine 100 UNIT/ML injection  Commonly known as:  LANTUS  Inject 0.25 mLs (25 Units total) into the skin at bedtime.     isosorbide mononitrate 30 MG 24 hr tablet  Commonly known as:  IMDUR  Take 1 tablet (30 mg total) by mouth daily.     magnesium oxide 400 MG tablet  Commonly known as:  MAG-OX  Take 1 tablet (400 mg total) by mouth 2 (two) times daily.     milrinone 20 MG/100ML Soln infusion  Commonly known as:  PRIMACOR  Inject 62.7375 mcg/min into the vein continuous.     potassium chloride SA 20 MEQ tablet  Commonly known as:  K-DUR,KLOR-CON  Take  2 tablets (40 mEq total) by mouth 2 (two) times daily.     traMADol 50 MG tablet  Commonly known as:  ULTRAM  Take 2 tablets (100 mg total) by mouth every 6 (six) hours as needed for moderate pain.        Outstanding Labs/Studies  bmet @ f/u.  Duration of Discharge Encounter   Greater than 30 minutes including physician time.  Signed, Ok Anishristopher R Berge NP 04/15/2014, 1:28 PM  The patient was seen, examined and discussed with Saralyn Pilarhris Berger, NP and agree as above.  Lars MassonKatarina H Alekzander Cardell 04/16/2014

## 2014-04-15 NOTE — Progress Notes (Signed)
CARE MANAGEMENT NOTE 04/15/2014  Patient:  Steven Wyatt, Steven Wyatt   Account Number:  [id number]  Date Initiated:  04/11/2014  Documentation initiated by:  [facility]  Subjective/Objective Assessment:   38 yo male admitted with Chest pain, atypical, Acute on chronic systolic CHF. S/p recent I/D for perirectal abscess.//Home with spouse.     Action/Plan:   Milrinone gtt;  lasix gtt as opposed to boluses given recent fluctuation in renal function; Strict I/Os, daily weights//Resume Home Health services   Anticipated DC Date:  04/15/2014   Anticipated DC Plan:  HOME W HOME HEALTH SERVICES      DC Planning Services  CM consult  HF Clinic      [facility] Choice  Resumption Of Svcs/PTA Provider   Choice offered to / List presented to:  C-1 Patient   DME arranged  IV PUMP/EQUIPMENT      DME agency  [facility]     [facility] arranged  HH-1 RN  HH-10 DISEASE MANAGEMENT      HH agency  [facility]   Status of service:  Completed, signed off Medicare Important Message given?   (If response is "NO", the following Medicare IM given date fields will be blank) Date Medicare IM given:   Date Additional Medicare IM given:    Discharge Disposition:    Per UR Regulation:  Reviewed for med. necessity/level of care/duration of stay  If discussed at Long Length of Stay Meetings, dates discussed:    Comments:  04/15/14 11:00 CM spoke with pt concerning discharge.  CM faxed milrinone order to [facility].  Arrangement of pump and [facility] RN to hook up milrinone cancelled for today as pt states he has no electric.  Pt to discharge tomorrow if he has electric.  Will continue to monitor for discharge needs. Freddy Jaksch, BSN, [place] [phone].  04/14/14 1430 Oletta Cohn, RN, BSN, [phone] LifeVest orders placed,  faxed and received on  04/12/14. Anell Barr and Tresa Endo of [facility] notified and will fit pt prior to d/c home (tentatively 4/25).  04/11/14 1100 Camellia Wood, RN, BSN, [place] [phone] Pt  currently active with [facility] for RN/ IV services.  Resumption of care requested.  Judeth Cornfield of [facility] notified.  No DME needs identified at this time.

## 2014-04-15 NOTE — Progress Notes (Signed)
The patient was upset about not having a tramadol order and raised his voice at the RN.  The RN notified Dr. Tresa Endo and new orders were given to give the patient 50mg  of tramadol q6h prn.  The patient was unhappy with the dosage and spoke with the charge nurse.  The charge nurse called Dr. Tresa Endo, who changed the tramadol dosage to 100mg .  An order was also given for a GI cocktail.

## 2014-04-15 NOTE — Progress Notes (Signed)
The patient stated that he was constipated.  Dr. Tresa Endo was notified.  New orders were given for a daily prn dose of polyethylene glycol.

## 2014-04-15 NOTE — Progress Notes (Addendum)
Patient ID: Steven Wyatt., male   DOB: 06-11-1976, 38 y.o.   MRN: [id number] Advanced Heart Failure Rounding Note  Subjective:    [name] is a 38 yo male with a history of NICM (nor cors Oct 2014), chronic systolic HF ECHO 8/84161/2015 EF 20% on chronic home milrinone via PICC, morbid obesity, OSA on BIPAP, HTN, CKD uncontrolled DM, and HLD. He has been followed closely in HF clinic chronic home Milrinone 0.375 mcg. Most recent admit 4/8 due to rectal abscess. Milrinone was cut back to 0.25 mcg during this admit due to elevated CO-OX. He had I & D performed 03/31/14. Discharge weight was 330 pounds. On 04/07/14 creatinine was up from 1.7 to 2.3 and he was instructed to hold bumex for 3 days and stop spiro. He says his weight has trending up from 330-345 pounds. He says his urine output has decreased since Milrinone was cut back to 0.25 mcg.   Admitted with chest pain and nausea.  CEs negative. Started on lasix drip at 8 mg per hour as he continued on Milrinone at 0.25 mcg.  CO-OX was 36% and Milrinone was increased to 0.375 mcg. Also carvedilol stopped due to low output. Diuretics held with rising creatinine then restarted yesterday night.  Yesterday coox 68%, now down to 41%. Weight up again. Able to ambulate 400 feet.   Denies SOB/Orthopnea/CP  CO-OX 36>68%>11% Creatinine 1.9> 2.08>1.88>1.95  Objective:   Weight Range:  Vital Signs:   Temp:  [97.7 F (36.5 C)-98.7 F (37.1 C)] 97.7 F (36.5 C) (04/25 0536) Pulse Rate:  [99-103] 103 (04/25 0536) Resp:  [20-22] 22 (04/25 0536) BP: (96-128)/(69-80) 127/80 mmHg (04/25 0536) SpO2:  [93 %] 93 % (04/25 0536) Weight:  [348 lb 9.6 oz (158.124 kg)] 348 lb 9.6 oz (158.124 kg) (04/25 0536) Last BM Date: 04/10/14  Weight change: Filed Weights   04/13/14 0451 04/14/14 0622 04/15/14 0536  Weight: 342 lb 8.8 oz (155.38 kg) 350 lb 5 oz (158.9 kg) 348 lb 9.6 oz (158.124 kg)    Intake/Output:   Intake/Output Summary (Last 24 hours) at 04/15/14  1308 Last data filed at 04/15/14 1030  Gross per 24 hour  Intake    600 ml  Output   1375 ml  Net   -775 ml     Physical Exam: General:  Obese male. Well appearing. No resp difficulty. Sitting on the side of the bed.  HEENT: normal Neck: supple. JVP difficult to assess due to body habitus   Carotids 2+ bilat; no bruits. No lymphadenopathy or thryomegaly appreciated. Cor: PMI nondisplaced. Tachycardic Regular rate & rhythm. No rubs, gallops or murmurs. Lungs: clear Abdomen: Obese, soft, nontender, nondistended. No hepatosplenomegaly. No bruits or masses. Good bowel sounds. Extremities: no cyanosis, clubbing, rash, edema RUE PICC Neuro: alert & orientedx3, cranial nerves grossly intact. moves all 4 extremities w/o difficulty. Affect pleasant  Telemetry: ST  Labs: Basic Metabolic Panel:  Recent Labs Lab 04/10/14 2050 04/11/14 0443 04/12/14 0445 04/12/14 1256 04/13/14 0445 04/14/14 0510 04/14/14 0800 04/15/14 0505  NA  --  137 137 135* 135*  --  134* 138  K  --  3.2* 3.8 4.1 3.8  --  4.2 4.2  CL  --  93* 94* 92* 93*  --  93* 95*  CO2  --  28 28 28 27   --  27 29  GLUCOSE  --  133* 150* 123* 128*  --  153* 121*  BUN  --  21 22 22  22  --  22 22  CREATININE  --  1.92* 2.08* 1.97* 1.88*  --  1.88* 1.95*  CALCIUM  --  8.8 8.7 9.0 8.9  --  9.1 8.9  MG 1.5 1.9  --  1.6  --  2.1  --   --     CBC:  Recent Labs Lab 04/10/14 1834 04/11/14 0443 04/15/14 0505  WBC 8.0 7.6 7.2  NEUTROABS  --  5.4  --   HGB 11.1* 10.4* 9.5*  HCT 33.5* 31.7* 29.5*  MCV 76.8* 77.1* 78.0  PLT 288 320 379    Cardiac Enzymes:  Recent Labs Lab 04/13/14 0306  TROPONINI <0.30    BNP: BNP (last 3 results)  Recent Labs  01/03/14 1248 01/11/14 1121 04/10/14 1859  PROBNP 1164.0* 2156.0* 1531.0*     Other results:  Imaging: No results found.   Medications:     Scheduled Medications: . atorvastatin  80 mg Oral Daily  . bumetanide  4 mg Oral BID  . carvedilol  3.125 mg Oral BID  WC  . digoxin  0.125 mg Oral Daily  . heparin  5,000 Units Subcutaneous 3 times per day  . hydrALAZINE  25 mg Oral 3 times per day  . insulin aspart  0-9 Units Subcutaneous TID WC  . insulin aspart  5 Units Subcutaneous TID WC  . insulin glargine  25 Units Subcutaneous QHS  . isosorbide mononitrate  30 mg Oral Daily  . magnesium oxide  400 mg Oral BID  . pantoprazole  40 mg Oral Daily  . potassium chloride  40 mEq Oral BID  . sodium chloride  3 mL Intravenous Q12H    Infusions: . milrinone 0.375 mcg/kg/min (04/15/14 0214)    PRN Medications: sodium chloride, albuterol, gi cocktail, HYDROcodone-acetaminophen, ondansetron (ZOFRAN) IV, polyethylene glycol, sodium chloride, sodium chloride, traMADol   Assessment:   1. Chest Pain  2. Chronic Systolic Heart Failure- on home milrinone 0.25 mcg  3. OAS- on BiPap  4. DM  5. Morbid Obesity  6. Hypokalemia 7. S/P I & D perirectal abscess 03/31/14  8. CKD- creatinine 1.9>2.08   Plan/Discussion:    Milrinone was increased to 0.375 due to low CO-OX 36%. Yesterday CO-OX improved to 68%, 61% yesterday.  Will resend because not sure this is accurate. Continue digoxin. (dig level 0.5). Continue current dose of hydralazine/imdur. No Ace/spiro due to CKD. He is back on Bumex, started last night.  Will need evaluation for CRT-D to see if CRT enables him to get off milrinone (has EP outpatient followup).  If not, LVAD is a consideration.  Had NSVT in hospital, received Lifevest yesterday.  Will need to resume [facility] when discharged. Milrinone at 0.375 mcg with weekly BMETs.   He has had chest pressure nightly x weeks.  Ruled out for MI here.  ? GERD.  Will start Protonix.   The patient is verbally abusive, screaming and threatening if we don't prescribe him Vicodin for possible chest pain, we will prescribe Tramadol.  Length of Stay: 5 Lars Masson 04/15/2014, 1:08 PM

## 2014-04-16 LAB — GLUCOSE, CAPILLARY: Glucose-Capillary: 130 mg/dL — ABNORMAL HIGH (ref 70–99)

## 2014-04-16 MED ORDER — HEPARIN SOD (PORK) LOCK FLUSH 100 UNIT/ML IV SOLN
250.0000 [IU] | Freq: Every day | INTRAVENOUS | Status: DC
  Filled 2014-04-16: qty 3

## 2014-04-16 MED ORDER — HEPARIN SOD (PORK) LOCK FLUSH 100 UNIT/ML IV SOLN
250.0000 [IU] | INTRAVENOUS | Status: DC | PRN
  Administered 2014-04-16: 250 [IU]
  Filled 2014-04-16: qty 3

## 2014-04-16 NOTE — Discharge Summary (Signed)
Steven Wyatt 04/16/2014

## 2014-04-16 NOTE — Progress Notes (Addendum)
Steven Wyatt Steven Wyatt 04/16/2014  

## 2014-04-16 NOTE — Progress Notes (Addendum)
Patient Name: Steven Wyatt. Date of Encounter: 04/16/2014   Principal Problem:   Acute on chronic systolic CHF (congestive heart failure) Active Problems:   NICM (nonischemic cardiomyopathy)   Morbid obesity with BMI of 50.0-59.9, adult   HTN (hypertension)   CKD (chronic kidney disease), stage III   Diabetes mellitus   Acute on chronic renal failure   Sleep apnea- he reports comliance with Bi Pap   Hypokalemia   VT (ventricular tachycardia)   SUBJECTIVE  Pt was set up for d/c yesterday however did not have power @ home and thus was unable to go, given his requirement for home milrinone infusion.  Feels well this am.  No dyspnea.  Wt down 3 lbs from yesterday.  Eager to go home (now has power).  CURRENT MEDS . atorvastatin  80 mg Oral Daily  . bumetanide  4 mg Oral BID  . carvedilol  3.125 mg Oral BID WC  . digoxin  0.125 mg Oral Daily  . heparin  5,000 Units Subcutaneous 3 times per day  . hydrALAZINE  25 mg Oral 3 times per day  . insulin aspart  0-9 Units Subcutaneous TID WC  . insulin aspart  5 Units Subcutaneous TID WC  . insulin glargine  25 Units Subcutaneous QHS  . isosorbide mononitrate  30 mg Oral Daily  . magnesium oxide  400 mg Oral BID  . pantoprazole  40 mg Oral Daily  . potassium chloride  40 mEq Oral BID  . sodium chloride  3 mL Intravenous Q12H   OBJECTIVE  Filed Vitals:   04/15/14 2124 04/16/14 0139 04/16/14 0632 04/16/14 0635  BP: 105/71 107/70 108/84   Pulse: 84 63 96   Temp: 98.1 F (36.7 C) 97.8 F (36.6 C) 97.3 F (36.3 C)   TempSrc: Oral Oral Oral   Resp: 20 20 20    Height:      Weight:    345 lb 14.4 oz (156.899 kg)  SpO2: 97% 96% 93%     Intake/Output Summary (Last 24 hours) at 04/16/14 0759 Last data filed at 04/16/14 0636  Gross per 24 hour  Intake 1148.39 ml  Output   1650 ml  Net -501.61 ml   Filed Weights   04/14/14 0622 04/15/14 0536 04/16/14 0635  Weight: 350 lb 5 oz (158.9 kg) 348 lb 9.6 oz (158.124 kg) 345 lb 14.4  oz (156.899 kg)   PHYSICAL EXAM  General: Pleasant, NAD. Neuro: Alert and oriented X 3. Moves all extremities spontaneously. Psych: Normal affect. HEENT:  Normal  Neck: supple, obese, difficult to assess jvp.  No bruits. Lungs:  Resp regular and unlabored, CTA. Heart: RRR, distant, no murmurs.  Abdomen: Obese, soft, non-tender, non-distended, BS + x 4.  Extremities: No clubbing, cyanosis or edema. DP/PT/Radials 2+ and equal bilaterally.  Accessory Clinical Findings  CBC  Recent Labs  04/15/14 0505  WBC 7.2  HGB 9.5*  HCT 29.5*  MCV 78.0  PLT 379   Basic Metabolic Panel  Recent Labs  04/14/14 0510 04/14/14 0800 04/15/14 0505  NA  --  134* 138  K  --  4.2 4.2  CL  --  93* 95*  CO2  --  27 29  GLUCOSE  --  153* 121*  BUN  --  22 22  CREATININE  --  1.88* 1.95*  CALCIUM  --  9.1 8.9  MG 2.1  --   --    TELE  Rsr, pvc's.  Radiology/Studies  Dg Chest  2 View  04/10/2014   CLINICAL DATA:  Left-sided chest pain and shortness of breath for 3 days.  EXAM: CHEST  2 VIEW  COMPARISON:  02/24/2014  FINDINGS: Cardiac enlargement with pulmonary vascular congestion. Vascular congestion is increasing and there is interval development of interstitial edema and small bilateral pleural effusions since the previous study. Right PICC line remains unchanged in position.  IMPRESSION: Cardiac enlargement with increasing pulmonary vascular congestion and developing edema.   Electronically Signed   By: Burman NievesWilliam  Stevens M.D.   On: 04/10/2014 21:58    ASSESSMENT AND PLAN  1.  Acute on chronic systolic CHF/NICM:  Continues to do well.  Breathing stable.  Wt down.  Now has power @ home.  Cont milrinone @ 0.375 mcg/kg/min, digoxin, bumex, bb, hydral/nitrate.  No acei/arb/spiro 2/2 CKD.  He has f/u in CHF clinic on 4/30.  LifeVest has been fitted and he has with him.  He has EP f/u scheduled for consideration of CRT-D placement.  2.  Chest pain:  No obj evidence of ischemia.  No further w/u.   He is taking PRN ultram and will be provided with a brief rx @ d/c w/o refills.  3.  DM:  Cont home regimen.  4.  CKD III:  F/u bmet @ f/u 4/30.  5.  Perirectal abscess: s/p I & D.   Penrose drain removed this admission.  He has f/u with surgery scheduled.  6:  OSA: on bipap.  7.  Morbid Obesity:  Needs to lose weight.  Is not really in a position to begin an exercise regimen.  Diabetic diet.  8.  Dispo:  D/c today.   Signed, Ok Anishristopher R Berge NP  The patient was seen, examined and discussed with Saralyn Pilarhris Berger, NP and agree as above.  The patient was supposed to be discharged yesterday but couldn't because of lack of electricity in his house. No Co-Ox since 04/14/2014 but he continues to do well.   We will discharge today with an early follow up at the CHF clinic.   Lars MassonKatarina H Akane Tessier 04/16/2014

## 2014-04-16 NOTE — Progress Notes (Signed)
Checked with pt about Bipap for hs,he stated he could put his self on when he was ready to wear it.I let him knew to call if he needed any help.

## 2014-04-16 NOTE — Progress Notes (Signed)
CARE MANAGEMENT NOTE 04/16/2014  Patient:  Steven Wyatt, Steven Wyatt   Account Number:  [id number]  Date Initiated:  04/11/2014  Documentation initiated by:  [facility]  Subjective/Objective Assessment:   38 yo male admitted with Chest pain, atypical, Acute on chronic systolic CHF. S/p recent I/D for perirectal abscess.//Home with spouse.     Action/Plan:   Milrinone gtt;  lasix gtt as opposed to boluses given recent fluctuation in renal function; Strict I/Os, daily weights//Resume Home Health services   Anticipated DC Date:  04/15/2014   Anticipated DC Plan:  HOME W HOME HEALTH SERVICES      DC Planning Services  CM consult  HF Clinic      [facility] Choice  Resumption Of Svcs/PTA Provider   Choice offered to / List presented to:  C-1 Patient   DME arranged  IV PUMP/EQUIPMENT      DME agency  [facility]     [facility] arranged  HH-1 RN  HH-10 DISEASE MANAGEMENT      HH agency  [facility]   Status of service:  Completed, signed off Medicare Important Message given?   (If response is "NO", the following Medicare IM given date fields will be blank) Date Medicare IM given:   Date Additional Medicare IM given:    Discharge Disposition:  HOME W HOME HEALTH SERVICES  Per UR Regulation:  Reviewed for med. necessity/level of care/duration of stay  If discussed at Long Length of Stay Meetings, dates discussed:    Comments:  04/16/14 09:20 Cm spoke with pt concerning his electric.  Pt states he has electric.  CM spoke with [facility] pharmacy to schedule an RN from [facility] to come to pt's room to hook up milinone pump.  No other CM needs were communicated.  Freddy Jaksch, BSN, [place] [phone].  04/15/14 11:00 CM spoke with pt concerning discharge.  CM faxed milrinone order to [facility].  Arrangement of pump and [facility] RN to hook up milrinone cancelled for today as pt states he has no electric.  Pt to discharge tomorrow if he has electric.  Will continue to monitor for discharge needs. Freddy Jaksch, BSN, [place] [phone].  04/14/14 1430 Oletta Cohn, RN, BSN, [phone] LifeVest orders placed,  faxed and received on  04/12/14. Anell Barr and Tresa Endo of [facility] notified and will fit pt prior to d/c home (tentatively 4/25).  04/11/14 1100 Camellia Wood, RN, BSN, [place] [phone] Pt currently active with [facility] for RN/ IV services.  Resumption of care requested.  Judeth Cornfield of [facility] notified.  No DME needs identified at this time.

## 2014-04-20 ENCOUNTER — Inpatient Hospital Stay (HOSPITAL_COMMUNITY): Admit: 2014-04-20 | Payer: Medicare Other

## 2014-04-20 ENCOUNTER — Encounter (HOSPITAL_COMMUNITY): Payer: Self-pay

## 2014-04-20 ENCOUNTER — Ambulatory Visit: Payer: Medicare Other | Admitting: Physician Assistant

## 2014-04-21 ENCOUNTER — Telehealth (HOSPITAL_COMMUNITY): Payer: Self-pay

## 2014-04-21 NOTE — Telephone Encounter (Signed)
Patient called to inform of lab results.  Instructed to take extra 40 meq PO potassium today.  Aware and agreeable. Ave Filter

## 2014-04-22 ENCOUNTER — Inpatient Hospital Stay (HOSPITAL_COMMUNITY)
Admission: EM | Admit: 2014-04-22 | Discharge: 2014-04-24 | DRG: 292 | Disposition: A | Discharge status: Home Health | Payer: Medicare Other | Attending: Cardiovascular Disease | Admitting: Cardiovascular Disease

## 2014-04-22 ENCOUNTER — Emergency Department (HOSPITAL_COMMUNITY): Payer: Medicare Other

## 2014-04-22 ENCOUNTER — Encounter (HOSPITAL_COMMUNITY): Payer: Self-pay | Admitting: Emergency Medicine

## 2014-04-22 ENCOUNTER — Telehealth: Payer: Self-pay | Admitting: Physician Assistant

## 2014-04-22 DIAGNOSIS — Z5987 Material hardship due to limited financial resources, not elsewhere classified: Secondary | ICD-10-CM

## 2014-04-22 DIAGNOSIS — J45909 Unspecified asthma, uncomplicated: Secondary | ICD-10-CM | POA: Diagnosis present

## 2014-04-22 DIAGNOSIS — I509 Heart failure, unspecified: Secondary | ICD-10-CM | POA: Diagnosis present

## 2014-04-22 DIAGNOSIS — Z833 Family history of diabetes mellitus: Secondary | ICD-10-CM

## 2014-04-22 DIAGNOSIS — I2589 Other forms of chronic ischemic heart disease: Secondary | ICD-10-CM | POA: Diagnosis present

## 2014-04-22 DIAGNOSIS — Z598 Other problems related to housing and economic circumstances: Secondary | ICD-10-CM

## 2014-04-22 DIAGNOSIS — I428 Other cardiomyopathies: Secondary | ICD-10-CM | POA: Diagnosis present

## 2014-04-22 DIAGNOSIS — I472 Ventricular tachycardia, unspecified: Secondary | ICD-10-CM | POA: Diagnosis present

## 2014-04-22 DIAGNOSIS — Z8249 Family history of ischemic heart disease and other diseases of the circulatory system: Secondary | ICD-10-CM

## 2014-04-22 DIAGNOSIS — I5023 Acute on chronic systolic (congestive) heart failure: Principal | ICD-10-CM | POA: Diagnosis present

## 2014-04-22 DIAGNOSIS — Z6841 Body Mass Index (BMI) 40.0 and over, adult: Secondary | ICD-10-CM

## 2014-04-22 DIAGNOSIS — I129 Hypertensive chronic kidney disease with stage 1 through stage 4 chronic kidney disease, or unspecified chronic kidney disease: Secondary | ICD-10-CM | POA: Diagnosis present

## 2014-04-22 DIAGNOSIS — E876 Hypokalemia: Secondary | ICD-10-CM | POA: Diagnosis present

## 2014-04-22 DIAGNOSIS — I959 Hypotension, unspecified: Secondary | ICD-10-CM | POA: Diagnosis present

## 2014-04-22 DIAGNOSIS — N183 Chronic kidney disease, stage 3 unspecified: Secondary | ICD-10-CM | POA: Diagnosis present

## 2014-04-22 DIAGNOSIS — Z888 Allergy status to other drugs, medicaments and biological substances status: Secondary | ICD-10-CM

## 2014-04-22 DIAGNOSIS — I5022 Chronic systolic (congestive) heart failure: Secondary | ICD-10-CM

## 2014-04-22 DIAGNOSIS — I5043 Acute on chronic combined systolic (congestive) and diastolic (congestive) heart failure: Secondary | ICD-10-CM | POA: Diagnosis present

## 2014-04-22 DIAGNOSIS — G4733 Obstructive sleep apnea (adult) (pediatric): Secondary | ICD-10-CM | POA: Diagnosis present

## 2014-04-22 DIAGNOSIS — Z91013 Allergy to seafood: Secondary | ICD-10-CM

## 2014-04-22 DIAGNOSIS — Z794 Long term (current) use of insulin: Secondary | ICD-10-CM

## 2014-04-22 DIAGNOSIS — Z91199 Patient's noncompliance with other medical treatment and regimen due to unspecified reason: Secondary | ICD-10-CM

## 2014-04-22 DIAGNOSIS — Z9119 Patient's noncompliance with other medical treatment and regimen: Secondary | ICD-10-CM

## 2014-04-22 DIAGNOSIS — E785 Hyperlipidemia, unspecified: Secondary | ICD-10-CM | POA: Diagnosis present

## 2014-04-22 DIAGNOSIS — E119 Type 2 diabetes mellitus without complications: Secondary | ICD-10-CM | POA: Diagnosis present

## 2014-04-22 DIAGNOSIS — D649 Anemia, unspecified: Secondary | ICD-10-CM | POA: Diagnosis present

## 2014-04-22 DIAGNOSIS — G4730 Sleep apnea, unspecified: Secondary | ICD-10-CM

## 2014-04-22 DIAGNOSIS — I4729 Other ventricular tachycardia: Secondary | ICD-10-CM | POA: Diagnosis present

## 2014-04-22 DIAGNOSIS — I10 Essential (primary) hypertension: Secondary | ICD-10-CM | POA: Diagnosis present

## 2014-04-22 LAB — COMPREHENSIVE METABOLIC PANEL
ALT: 13 U/L (ref 0–53)
AST: 18 U/L (ref 0–37)
Albumin: 3.2 g/dL — ABNORMAL LOW (ref 3.5–5.2)
Alkaline Phosphatase: 65 U/L (ref 39–117)
BUN: 15 mg/dL (ref 6–23)
CO2: 30 mEq/L (ref 19–32)
Calcium: 8.5 mg/dL (ref 8.4–10.5)
Chloride: 92 mEq/L — ABNORMAL LOW (ref 96–112)
Creatinine, Ser: 1.2 mg/dL (ref 0.50–1.35)
GFR calc Af Amer: 89 mL/min — ABNORMAL LOW (ref >90)
GFR calc non Af Amer: 77 mL/min — ABNORMAL LOW (ref >90)
Glucose, Bld: 124 mg/dL — ABNORMAL HIGH (ref 70–99)
Potassium: 3.1 mEq/L — ABNORMAL LOW (ref 3.7–5.3)
SODIUM: 138 meq/L (ref 137–147)
TOTAL PROTEIN: 7.6 g/dL (ref 6.0–8.3)
Total Bilirubin: 1.5 mg/dL — ABNORMAL HIGH (ref 0.3–1.2)

## 2014-04-22 LAB — CBC WITH DIFFERENTIAL/PLATELET
BASOS PCT: 1 % (ref 0–1)
Basophils Absolute: 0 10*3/uL (ref 0.0–0.1)
EOS ABS: 0.2 10*3/uL (ref 0.0–0.7)
Eosinophils Relative: 3 % (ref 0–5)
HCT: 28.9 % — ABNORMAL LOW (ref 39.0–52.0)
Hemoglobin: 9.3 g/dL — ABNORMAL LOW (ref 13.0–17.0)
Lymphocytes Relative: 15 % (ref 12–46)
Lymphs Abs: 0.8 10*3/uL (ref 0.7–4.0)
MCH: 25.4 pg — ABNORMAL LOW (ref 26.0–34.0)
MCHC: 32.2 g/dL (ref 30.0–36.0)
MCV: 79 fL (ref 78.0–100.0)
Monocytes Absolute: 0.3 10*3/uL (ref 0.1–1.0)
Monocytes Relative: 6 % (ref 3–12)
Neutro Abs: 4.1 10*3/uL (ref 1.7–7.7)
Neutrophils Relative %: 75 % (ref 43–77)
Platelets: 298 10*3/uL (ref 150–400)
RBC: 3.66 MIL/uL — ABNORMAL LOW (ref 4.22–5.81)
RDW: 19 % — AB (ref 11.5–15.5)
WBC: 5.4 10*3/uL (ref 4.0–10.5)

## 2014-04-22 LAB — DIGOXIN LEVEL: Digoxin Level: 0.8 ng/mL (ref 0.8–2.0)

## 2014-04-22 LAB — PROTIME-INR
INR: 1.17 (ref 0.00–1.49)
Prothrombin Time: 14.7 seconds (ref 11.6–15.2)

## 2014-04-22 LAB — PRO B NATRIURETIC PEPTIDE: PRO B NATRI PEPTIDE: 991.4 pg/mL — AB (ref 0–125)

## 2014-04-22 LAB — TROPONIN I: Troponin I: <0.3 ng/mL (ref <0.30)

## 2014-04-22 LAB — GLUCOSE, CAPILLARY: Glucose-Capillary: 174 mg/dL — ABNORMAL HIGH (ref 70–99)

## 2014-04-22 MED ORDER — TRAMADOL HCL 50 MG PO TABS
100.0000 mg | ORAL_TABLET | Freq: Four times a day (QID) | ORAL | Status: DC | PRN
  Administered 2014-04-23 (×3): 100 mg via ORAL
  Filled 2014-04-22 (×3): qty 2

## 2014-04-22 MED ORDER — ENOXAPARIN SODIUM 40 MG/0.4ML ~~LOC~~ SOLN
40.0000 mg | SUBCUTANEOUS | Status: DC
  Filled 2014-04-22 (×3): qty 0.4

## 2014-04-22 MED ORDER — ONDANSETRON HCL 4 MG/2ML IJ SOLN
4.0000 mg | Freq: Four times a day (QID) | INTRAMUSCULAR | Status: DC | PRN
Start: 2014-04-22 — End: 2014-04-24
  Administered 2014-04-23: 4 mg via INTRAVENOUS
  Filled 2014-04-22: qty 2

## 2014-04-22 MED ORDER — DIGOXIN 125 MCG PO TABS
0.1250 mg | ORAL_TABLET | Freq: Every day | ORAL | Status: DC
  Administered 2014-04-23 – 2014-04-24 (×2): 0.125 mg via ORAL
  Filled 2014-04-22 (×2): qty 1

## 2014-04-22 MED ORDER — MAGNESIUM OXIDE 400 MG PO TABS: 400.0000 mg | ORAL_TABLET | Freq: Two times a day (BID) | ORAL | Status: DC

## 2014-04-22 MED ORDER — ISOSORBIDE MONONITRATE ER 60 MG PO TB24: 60.0000 mg | ORAL_TABLET | Freq: Every day | ORAL | Status: DC

## 2014-04-22 MED ORDER — POTASSIUM CHLORIDE CRYS ER 20 MEQ PO TBCR
40.0000 meq | EXTENDED_RELEASE_TABLET | Freq: Two times a day (BID) | ORAL | Status: DC
  Administered 2014-04-22 – 2014-04-24 (×3): 40 meq via ORAL
  Filled 2014-04-22 (×6): qty 2

## 2014-04-22 MED ORDER — MILRINONE IN DEXTROSE 20 MG/100ML IV SOLN
0.3750 ug/kg/min | INTRAVENOUS | Status: DC
Start: 2014-04-22 — End: 2014-04-24
  Administered 2014-04-22 – 2014-04-24 (×6): 0.375 ug/kg/min via INTRAVENOUS
  Filled 2014-04-22 (×8): qty 100

## 2014-04-22 MED ORDER — ISOSORBIDE MONONITRATE ER 60 MG PO TB24
60.0000 mg | ORAL_TABLET | Freq: Every day | ORAL | Status: DC
  Administered 2014-04-23 – 2014-04-24 (×2): 60 mg via ORAL
  Filled 2014-04-22 (×3): qty 1

## 2014-04-22 MED ORDER — HYDRALAZINE HCL 25 MG PO TABS
37.5000 mg | ORAL_TABLET | Freq: Three times a day (TID) | ORAL | Status: DC
  Administered 2014-04-22 – 2014-04-24 (×6): 37.5 mg via ORAL
  Filled 2014-04-22 (×12): qty 1.5

## 2014-04-22 MED ORDER — MAGNESIUM OXIDE 400 (241.3 MG) MG PO TABS
400.0000 mg | ORAL_TABLET | Freq: Two times a day (BID) | ORAL | Status: DC
  Administered 2014-04-22 – 2014-04-24 (×4): 400 mg via ORAL
  Filled 2014-04-22 (×5): qty 1

## 2014-04-22 MED ORDER — INSULIN GLARGINE 100 UNIT/ML ~~LOC~~ SOLN
25.0000 [IU] | Freq: Every day | SUBCUTANEOUS | Status: DC
  Administered 2014-04-22 – 2014-04-23 (×2): 25 [IU] via SUBCUTANEOUS
  Filled 2014-04-22 (×3): qty 0.25

## 2014-04-22 MED ORDER — ACETAMINOPHEN 325 MG PO TABS: 325.0000 mg | ORAL_TABLET | Freq: Four times a day (QID) | ORAL | Status: DC | PRN

## 2014-04-22 MED ORDER — ATORVASTATIN CALCIUM 80 MG PO TABS
80.0000 mg | ORAL_TABLET | Freq: Every day | ORAL | Status: DC
  Administered 2014-04-22 – 2014-04-23 (×2): 80 mg via ORAL
  Filled 2014-04-22 (×3): qty 1

## 2014-04-22 MED ORDER — ALPRAZOLAM 0.25 MG PO TABS: 0.2500 mg | ORAL_TABLET | Freq: Two times a day (BID) | ORAL | Status: DC | PRN

## 2014-04-22 MED ORDER — ACETAMINOPHEN 325 MG PO TABS: 650.0000 mg | ORAL_TABLET | ORAL | Status: DC | PRN

## 2014-04-22 MED ORDER — SODIUM CHLORIDE 0.9 % IJ SOLN
10.0000 mL | INTRAMUSCULAR | Status: DC | PRN
  Administered 2014-04-23 – 2014-04-24 (×2): 10 mL

## 2014-04-22 MED ORDER — HYDRALAZINE HCL 25 MG PO TABS: 37.5000 mg | ORAL_TABLET | Freq: Three times a day (TID) | ORAL | Status: DC

## 2014-04-22 MED ORDER — TRAMADOL HCL 50 MG PO TABS
100.0000 mg | ORAL_TABLET | Freq: Once | ORAL | Status: AC
  Administered 2014-04-22: 100 mg via ORAL
  Filled 2014-04-22: qty 2

## 2014-04-22 MED ORDER — ZOLPIDEM TARTRATE 5 MG PO TABS: 5.0000 mg | ORAL_TABLET | Freq: Every evening | ORAL | Status: DC | PRN

## 2014-04-22 MED ORDER — CARVEDILOL 3.125 MG PO TABS
3.1250 mg | ORAL_TABLET | Freq: Two times a day (BID) | ORAL | Status: DC
Start: 2014-04-22 — End: 2014-04-24
  Administered 2014-04-22 – 2014-04-24 (×4): 3.125 mg via ORAL
  Filled 2014-04-22 (×5): qty 1

## 2014-04-22 MED ORDER — ALBUTEROL SULFATE HFA 108 (90 BASE) MCG/ACT IN AERS: 2.0000 | INHALATION_SPRAY | Freq: Every day | RESPIRATORY_TRACT | Status: DC | PRN

## 2014-04-22 MED ORDER — ALBUTEROL SULFATE (2.5 MG/3ML) 0.083% IN NEBU: 2.5000 mg | INHALATION_SOLUTION | Freq: Every day | RESPIRATORY_TRACT | Status: DC | PRN

## 2014-04-22 MED ORDER — FUROSEMIDE 10 MG/ML IJ SOLN
40.0000 mg | Freq: Two times a day (BID) | INTRAMUSCULAR | Status: DC
  Administered 2014-04-22 – 2014-04-23 (×2): 40 mg via INTRAVENOUS
  Filled 2014-04-22 (×4): qty 4

## 2014-04-22 MED ORDER — NITROGLYCERIN 0.4 MG SL SUBL: 0.4000 mg | SUBLINGUAL_TABLET | SUBLINGUAL | Status: DC | PRN

## 2014-04-22 NOTE — ED Notes (Signed)
MD at bedside. 

## 2014-04-22 NOTE — Telephone Encounter (Signed)
[name] called because he is extremely short of breath and has not slept for 3 nights because of this. He has been out of one of his medications for a week, believed to be hydralazine, and also is having severe chest pain. He has called EMS to transport him.  I advised him that was the best thing to do and advised him that the ER staff was initially treated him and then call us. The patient was in agreement with this plan.

## 2014-04-22 NOTE — ED Notes (Signed)
Patients chest pain started yesterday, shortness of breath, received 324mg  ASA per EMS, pt has PICC line in right arm.  bil lower edema.

## 2014-04-22 NOTE — H&P (Signed)
History and Physical   Patient ID: Steven Wyatt. MRN: [id number], DOB/AGE: [birth year] 37 y.o. Date of Encounter: 04/22/2014  Primary Physician: Oliver Barre, MD Primary Cardiologist: Dr. Gala Romney  Chief Complaint:  CHF  HPI: Steven Wyatt. is a 38 y.o. male with a history of NICM, CHF.  He has had problems affording his medications and was out of the Coreg and hydralazine over the last week. He got the Coreg yesterday and the hydralazine has been authorized, not restarted yet.   He has been compliant with his other medications and has been vigilant about dietary restrictions. He has been doing well since his last discharge until this last week. His weight has not increased, but he has become increasingly SOB. DOE progressed to PND and orthopnea, despite no weight gain. Feels his breathing is as bad as it was 50 lbs ago. No sleep for the last 3 nights. Today, he developed severe chest pain and that, associated with the SOB brought him to the hospital.  The chest pain is down to a 6/10, to the left of the sternum and worse with deep inspiration.  Past Medical History  Diagnosis Date  . Chronic systolic CHF (congestive heart failure)     a. on home milrinone - dose titrate to 0.350mcg/kg/min 03/2014.  . Diabetes   . HTN (hypertension)   . Morbid obesity with BMI of 50.0-59.9, adult   . Sleep apnea     on Bi Pap  . Dyslipidemia   . CKD (chronic kidney disease), stage III   . Asthma 01/03/2014  . DKA (diabetic ketoacidoses) 02/25/2014  . Hypokalemia 01/04/2014  . Hyponatremia 02/25/2014  . Microcytic anemia 01/04/2014  . 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 Normal coronary arteries- last cath Oct 2014 at [facility] 01/03/2014  . History of chicken pox   . Perirectal abscess 03/2014    Surgical History:  Past Surgical History  Procedure Laterality Date  . Incision and drainage abscess N/A 03/31/2014    Procedure: INCISION AND DRAINAGE  PERI-RECTAL ABSCESS;  Surgeon:  Axel Filler, MD;  Location: [facility] OR;  Service: General;  Laterality: N/A;     I have reviewed the patient's current medications. Prior to Admission medications   Medication Sig Start Date End Date Taking? Authorizing Provider  acetaminophen (TYLENOL) 325 MG tablet Take 325 mg by mouth every 6 (six) hours as needed for mild pain.   Yes Historical Provider, MD  albuterol (PROVENTIL HFA;VENTOLIN HFA) 108 (90 BASE) MCG/ACT inhaler Inhale 2 puffs into the lungs daily as needed for wheezing or shortness of breath. 01/08/14  Yes Wilburt Finlay, PA-C  atorvastatin (LIPITOR) 80 MG tablet Take 80 mg by mouth daily. 01/08/14  Yes Wilburt Finlay, PA-C  bumetanide (BUMEX) 2 MG tablet Take 2 tablets (4 mg total) by mouth 2 (two) times daily. 01/16/14  Yes Amy D Clegg, NP  carvedilol (COREG) 3.125 MG tablet Take 1 tablet (3.125 mg total) by mouth 2 (two) times daily. 02/20/14  Yes Dolores Patty, MD  digoxin (LANOXIN) 0.125 MG tablet Take 1 tablet (0.125 mg total) by mouth daily. 01/16/14  Yes Amy D Clegg, NP  hydrALAZINE (APRESOLINE) 25 MG tablet Take 1 tablet (25 mg total) by mouth every 8 (eight) hours. 01/23/14  Yes Amy D Clegg, NP  insulin aspart (NOVOLOG FLEXPEN) 100 UNIT/ML FlexPen Inject 5 Units into the skin 3 (three) times daily with meals. 03/09/14  Yes Baltazar Apo, PA-C  insulin glargine (LANTUS) 100 UNIT/ML  injection Inject 0.25 mLs (25 Units total) into the skin at bedtime. 02/26/14  Yes Hollice Espy, MD  isosorbide mononitrate (IMDUR) 30 MG 24 hr tablet Take 1 tablet (30 mg total) by mouth daily. 01/16/14  Yes Amy D Clegg, NP  magnesium oxide (MAG-OX) 400 MG tablet Take 1 tablet (400 mg total) by mouth 2 (two) times daily. 03/07/14  Yes Aundria Rud, NP  milrinone [facility]) 20 MG/100ML SOLN infusion Inject 62.7375 mcg/min into the vein continuous. 04/15/14  Yes Ok Anis, NP  potassium chloride SA (K-DUR,KLOR-CON) 20 MEQ tablet Take 2 tablets (40 mEq total) by mouth 2 (two) times daily.  04/15/14  Yes Ok Anis, NP  traMADol (ULTRAM) 50 MG tablet Take 2 tablets (100 mg total) by mouth every 6 (six) hours as needed for moderate pain. 04/15/14  Yes Ok Anis, NP   Allergies:  Allergies  Allergen Reactions  . Iodine Anaphylaxis  . Shellfish Allergy Anaphylaxis    History   Social History  . Marital Status: Married    Spouse Name: N/A    Number of Children: N/A  . Years of Education: N/A   Occupational History  . Clinical Social Worker    Social History Main Topics  . Smoking status: Never Smoker   . Smokeless tobacco: Never Used  . Alcohol Use: No  . Drug Use: No  . Sexual Activity: Yes   Other Topics Concern  . Not on file   Social History Narrative   Lives with wife and 2/5 children.    Family History  Problem Relation Age of Onset  . Hypertension Mother   . Diabetes Maternal Grandmother    Family Status  Relation Status Death Age  . Mother Alive     No hx CAD  . Father Deceased 32s    Cancer    Review of Systems:   Full 14-point review of systems otherwise negative except as noted above.  Physical Exam: Blood pressure 94/68, pulse 48, temperature 98.5 F (36.9 C), temperature source Oral, resp. rate 18, height 5\' 8"  (1.727 m), weight 342 lb (155.13 kg), SpO2 98.00%. General: Well developed, well nourished,male in no acute distress. Head: Normocephalic, atraumatic, sclera non-icteric, no xanthomas, nares are without discharge. Dentition: good Neck: No carotid bruits. JVD 10 cm. No thyromegally Lungs: Good expansion bilaterally. without wheezes or rhonchi.  Heart: IRRegular rate and rhythm with S1 S2.  No S3 or S4.  No murmur, no rubs, or gallops appreciated. SR w/ PVCs on telemetry Abdomen: Soft, non-tender, non-distended with normoactive bowel sounds. No hepatomegaly. No rebound/guarding. No obvious abdominal masses. Msk:  Strength and tone appear normal for age. No joint deformities or effusions, no spine or costo-vertebral  angle tenderness. Extremities: No clubbing or cyanosis. No edema.  Distal pedal pulses are 2+ in 4 extrem Neuro: Alert and oriented X 3. Moves all extremities spontaneously. No focal deficits noted. Psych:  Responds to questions appropriately with a normal affect. Skin: No rashes or lesions noted  Labs:   Lab Results  Component Value Date   WBC 5.4 04/22/2014   HGB 9.3* 04/22/2014   HCT 28.9* 04/22/2014   MCV 79.0 04/22/2014   PLT 298 04/22/2014    Recent Labs  04/22/14 1301  INR 1.17     Recent Labs Lab 04/22/14 1301  NA 138  K 3.1*  CL 92*  CO2 30  BUN 15  CREATININE 1.20  CALCIUM 8.5  PROT 7.6  BILITOT 1.5*  ALKPHOS 65  ALT 13  AST 18  GLUCOSE 124*    Recent Labs  04/22/14 1301  TROPONINI <0.30   Pro B Natriuretic peptide (BNP)  Date/Time Value Ref Range Status  04/22/2014  1:01 PM 991.4* 0 - 125 pg/mL Final  04/10/2014  6:59 PM 1531.0* 0 - 125 pg/mL Final   Radiology/Studies: Dg Chest 2 View 04/22/2014   CLINICAL DATA:  Short of breath.  Chest pain.  EXAM: CHEST  2 VIEW  COMPARISON:  03/2014  FINDINGS: Cardiac silhouette is mildly enlarged. Normal mediastinal and hilar contours.  Hazy airspace opacity is seen, most prominent centrally. No pleural effusion. No pneumothorax.  Bony thorax is intact. Right PICC has its tip in the mid superior vena cava.  IMPRESSION: Hazy central lung opacities suggest mild pulmonary edema, but could reflect bilateral infectious or inflammatory infiltrates. Appearance is similar to the most recent study.   Electronically Signed   By: Amie Portlandavid  Ormond M.D.   On: 04/22/2014 13:41    [place]  Echo: 03/30/2014 - Left ventricle: Very poor image quality and little endocardial definition Consider MRO or contrast echo if clinically indicated to furhter assess EF The cavity size was severely dilated. The estimated ejection fraction was 35%. Diffuse hypokinesis. - Left atrium: The atrium was mildly dilated.  ECG:  04/22/2014 SR, LBBB is old Vent. rate 105 BPM PR interval 183 ms QRS duration 144 ms QT/QTc 363/480 ms P-R-T axes 67 -59 105  ASSESSMENT AND PLAN:  Principal Problem:   Acute on chronic systolic CHF (congestive heart failure) - admit, diurese, follow labs, weight, I/O, continue milrinone  Active Problems:   Morbid obesity with BMI of 50.0-59.9, adult - compliant w/ diet, continue this    Sleep apnea- he reports comliance with Bi Pap - keep on BiPAP    HTN (hypertension) - follow w/ diuresis    Dyslipidemia - continue Rx    Hypokalemia - supplement and follow    CKD (chronic kidney disease), stage III - follow    Diabetes mellitus - SSI    Anemia - MCV has been low in the past, ck iron studies, no reports of tarry stools or bleed. May be from chronic disease.   Samson FredericSigned, Rhonda G Barrett, PA-C 04/22/2014 4:28 PM Beeper [phone]   Patient seen and examined. Agree with assessment and plan. Very pleasant obese AAM with a h/o NICM, OSA on Bi-PAP who was recently dc on 4/25. He had run out of his coreg and hydralazine for several days and now presents with pleuritic chest pain, PND, orthopnea. BNP is actually improved but elevated at 991. Will keep overnight, give IV lasix. Check VQ scan with h/o mildly elevated d-dimer during last admission at 1.85. With recent renal insufficiency, will not give contrast unless VQ is positive. Increase hydralazine to 37.5 mg every 8 hrs and increase nitrates. Respiratory to arrange for Bi-PAP Auto unit for sleep.   Lennette Biharihomas A. Elsye Mccollister, MD, [facility] Hospital[facility]ACC 04/22/2014 4:48 PM

## 2014-04-22 NOTE — ED Notes (Signed)
Has also been out of medications  For one week. Cannot afford medications.

## 2014-04-22 NOTE — ED Provider Notes (Signed)
CSN: [id number]     Arrival date & time 04/22/14  1210 History   First MD Initiated Contact with Patient 04/22/14 1229     Chief Complaint  Patient presents with  . Chest Pain     (Consider location/radiation/quality/duration/timing/severity/associated sxs/prior Treatment) Patient is a 38 y.o. male presenting with chest pain.  Chest Pain Associated symptoms: cough, fatigue, palpitations and shortness of breath   Associated symptoms: no diaphoresis and no fever    37 y.o. Male with morbid obesity, ckd, nonischemic cardiomyopathy, chronic chf on milrinone via picc presents today with worsening dyspnea, chest pain, and generalized weakness.  States he has not taken his coreg or hydralazine for a week due to financial constraints. States copay is $1.60 per medicine and he is on 14 meds plus insulin costs over $3.00.  States taking other meds and milrinone infuser on.  Worsened dyspnea yesterday and chest pain constantly since this am.  Did not take any medicine for pain and states the last time he was given nitro that his bp went low, pain was not helped, and he had " the worst headache of my life."  Upon questioning, he states he took his lifevest off, because it kept beeping and "going crazy" and he didn't want it to shock him.  He has some increased cough, but denies fever or dicolored sputum,  He has lower leg swelling but denies it is worse than usual.  His chest pain is central and sharp with a pulsating sensation.  Discharge weight was 348.    Past Medical History  Diagnosis Date  . Chronic systolic CHF (congestive heart failure)     a. on home milrinone - dose titrate to 0.365mcg/kg/min 03/2014.  . Diabetes   . HTN (hypertension)   . Morbid obesity with BMI of 50.0-59.9, adult   . Sleep apnea     on Bi Pap  . Dyslipidemia   . CKD (chronic kidney disease), stage III   . Asthma 01/03/2014  . DKA (diabetic ketoacidoses) 02/25/2014  . Hypokalemia 01/04/2014  . Hyponatremia 02/25/2014  .  Microcytic anemia 01/04/2014  . 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 Normal coronary arteries- last cath Oct 2014 at [facility] 01/03/2014  . History of chicken pox   . Perirectal abscess 03/2014   Past Surgical History  Procedure Laterality Date  . Incision and drainage abscess N/A 03/31/2014    Procedure: INCISION AND DRAINAGE  PERI-RECTAL ABSCESS;  Surgeon: Axel Filler, MD;  Location: [facility] OR;  Service: General;  Laterality: N/A;   Family History  Problem Relation Age of Onset  . Hypertension Mother   . Diabetes Maternal Grandmother    History  Substance Use Topics  . Smoking status: Never Smoker   . Smokeless tobacco: Never Used  . Alcohol Use: No    Review of Systems  Constitutional: Positive for activity change and fatigue. Negative for fever, chills, diaphoresis and appetite change.  HENT: Negative.   Eyes: Negative.   Respiratory: Positive for cough, shortness of breath and wheezing.   Cardiovascular: Positive for chest pain, palpitations and leg swelling.  Gastrointestinal: Negative.   Endocrine: Negative.   Genitourinary: Negative.   Musculoskeletal: Negative.   Skin: Negative.   Allergic/Immunologic: Negative.   Neurological: Negative.   Hematological: Negative.   Psychiatric/Behavioral: Negative.   All other systems reviewed and are negative.     Allergies  Iodine and Shellfish allergy  Home Medications   Prior to Admission medications   Medication Sig Start Date End Date  Taking? Authorizing Provider  acetaminophen (TYLENOL) 325 MG tablet Take 325 mg by mouth every 6 (six) hours as needed for mild pain.    Historical Provider, MD  albuterol (PROVENTIL HFA;VENTOLIN HFA) 108 (90 BASE) MCG/ACT inhaler Inhale 2 puffs into the lungs daily as needed for wheezing or shortness of breath. 01/08/14   Wilburt FinlayBryan Hager, PA-C  atorvastatin (LIPITOR) 80 MG tablet Take 80 mg by mouth daily. 01/08/14   Wilburt FinlayBryan Hager, PA-C  bumetanide (BUMEX) 2 MG tablet Take 2 tablets  (4 mg total) by mouth 2 (two) times daily. 01/16/14   Amy D Filbert Schilderlegg, NP  carvedilol (COREG) 3.125 MG tablet Take 1 tablet (3.125 mg total) by mouth 2 (two) times daily. 02/20/14   Dolores Pattyaniel R Bensimhon, MD  digoxin (LANOXIN) 0.125 MG tablet Take 1 tablet (0.125 mg total) by mouth daily. 01/16/14   Amy D Filbert Schilderlegg, NP  hydrALAZINE (APRESOLINE) 25 MG tablet Take 1 tablet (25 mg total) by mouth every 8 (eight) hours. 01/23/14   Amy D Clegg, NP  insulin aspart (NOVOLOG FLEXPEN) 100 UNIT/ML FlexPen Inject 5 Units into the skin 3 (three) times daily with meals. 03/09/14   Baltazar ApoNancy Hartman, PA-C  insulin glargine (LANTUS) 100 UNIT/ML injection Inject 0.25 mLs (25 Units total) into the skin at bedtime. 02/26/14   Hollice EspySendil K Krishnan, MD  isosorbide mononitrate (IMDUR) 30 MG 24 hr tablet Take 1 tablet (30 mg total) by mouth daily. 01/16/14   Amy D Clegg, NP  magnesium oxide (MAG-OX) 400 MG tablet Take 1 tablet (400 mg total) by mouth 2 (two) times daily. 03/07/14   Aundria RudAli B Cosgrove, NP  milrinone [facility](PRIMACOR) 20 MG/100ML SOLN infusion Inject 62.7375 mcg/min into the vein continuous. 04/15/14   Ok Anishristopher R Berge, NP  potassium chloride SA (K-DUR,KLOR-CON) 20 MEQ tablet Take 2 tablets (40 mEq total) by mouth 2 (two) times daily. 04/15/14   Ok Anishristopher R Berge, NP  traMADol (ULTRAM) 50 MG tablet Take 2 tablets (100 mg total) by mouth every 6 (six) hours as needed for moderate pain. 04/15/14   Ok Anishristopher R Berge, NP   Pulse 106  Temp(Src) 98.5 F (36.9 C) (Oral)  Ht 5\' 8"  (1.727 m)  Wt 339 lb (153.769 kg)  BMI 51.56 kg/m2  SpO2 94% Physical Exam  Vitals reviewed. Constitutional:  Morbidly obese  HENT:  Head: Normocephalic and atraumatic.  Right Ear: External ear normal.  Left Ear: External ear normal.  Nose: Nose normal.  Mouth/Throat: Oropharynx is clear and moist.  Eyes: Conjunctivae and EOM are normal. Pupils are equal, round, and reactive to light.  Neck: Normal range of motion. Neck supple.  Cardiovascular: Normal heart  sounds and normal pulses.  Tachycardia present.   Musculoskeletal: He exhibits edema.  Lower extremities consistent with chronic edema and chronic skin changes    ED Course  Procedures (including critical care time) Labs Review Labs Reviewed  CBC WITH DIFFERENTIAL - Abnormal; Notable for the following:    RBC 3.66 (*)    Hemoglobin 9.3 (*)    HCT 28.9 (*)    MCH 25.4 (*)    RDW 19.0 (*)    All other components within normal limits  COMPREHENSIVE METABOLIC PANEL - Abnormal; Notable for the following:    Potassium 3.1 (*)    Chloride 92 (*)    Glucose, Bld 124 (*)    Albumin 3.2 (*)    Total Bilirubin 1.5 (*)    GFR calc non Af Amer 77 (*)    GFR calc Af Denyse DagoAmer  89 (*)    All other components within normal limits  PRO B NATRIURETIC PEPTIDE - Abnormal; Notable for the following:    Pro B Natriuretic peptide (BNP) 991.4 (*)    All other components within normal limits  TROPONIN I  PROTIME-INR  DIGOXIN LEVEL    Imaging Review Dg Chest 2 View  04/22/2014   CLINICAL DATA:  Short of breath.  Chest pain.  EXAM: CHEST  2 VIEW  COMPARISON:  03/2014  FINDINGS: Cardiac silhouette is mildly enlarged. Normal mediastinal and hilar contours.  Hazy airspace opacity is seen, most prominent centrally. No pleural effusion. No pneumothorax.  Bony thorax is intact. Right PICC has its tip in the mid superior vena cava.  IMPRESSION: Hazy central lung opacities suggest mild pulmonary edema, but could reflect bilateral infectious or inflammatory infiltrates. Appearance is similar to the most recent study.   Electronically Signed   By: Amie Portland M.D.   On: 04/22/2014 13:41     EKG Interpretation   Date/Time:  Saturday Apr 22 2014 16:10:96 EDT Ventricular Rate:  105 PR Interval:  183 QRS Duration: 144 QT Interval:  363 QTC Calculation: 480 R Axis:   -59 Text Interpretation:  Sinus tachycardia Paired ventricular premature  complexes Left bundle branch block Confirmed by Azazel Franze MD, Duwayne Heck ([id number])   on 04/22/2014 1:45:03 PM      MDM   Final diagnoses:  None   chf- noncompliance with medications.  HR appears stable with weight at 342.   V tach- patient removed life vest and Dr. Mayford Knife states they will attempt to find out how to query.   Ischemic cardiomyopathy-    Patient states he is still having pain and this is the longest he has ever had to be in the ed and still be in pain.  Discussed with patient need to take medications and patient became angry, yelling "you are disrespectful, i told you i can't afford them.  You are the worst doctor ever.  You have made me stressed and caused me to yell.  Nurse!  I need the phone to call Dr. Jones Broom." I gave patient phone after letting him know I had already spoken with Dr. Mayford Knife and cardiology would be in to see him.  I am hesitant to give narcotics given relatively low bp, patient self reports hypotension with nitro, and nsaids could impact his already poor renal function.  Patient will continue to be monitored and cardiology has already been contacted.    Dr. Tresa Endo in department and patient evaluated by him and he plans admission  Hilario Quarry, MD 04/22/14 1759

## 2014-04-22 NOTE — ED Notes (Signed)
Patient transported to X-ray 

## 2014-04-23 LAB — COMPREHENSIVE METABOLIC PANEL
ALT: 15 U/L (ref 0–53)
AST: 20 U/L (ref 0–37)
Albumin: 3.2 g/dL — ABNORMAL LOW (ref 3.5–5.2)
Alkaline Phosphatase: 71 U/L (ref 39–117)
BUN: 17 mg/dL (ref 6–23)
CALCIUM: 8.8 mg/dL (ref 8.4–10.5)
CO2: 31 mEq/L (ref 19–32)
Chloride: 94 mEq/L — ABNORMAL LOW (ref 96–112)
Creatinine, Ser: 1.3 mg/dL (ref 0.50–1.35)
GFR calc non Af Amer: 70 mL/min — ABNORMAL LOW (ref >90)
GFR, EST AFRICAN AMERICAN: 81 mL/min — AB (ref >90)
GLUCOSE: 120 mg/dL — AB (ref 70–99)
Potassium: 3.2 mEq/L — ABNORMAL LOW (ref 3.7–5.3)
SODIUM: 139 meq/L (ref 137–147)
Total Bilirubin: 1.6 mg/dL — ABNORMAL HIGH (ref 0.3–1.2)
Total Protein: 7.6 g/dL (ref 6.0–8.3)

## 2014-04-23 LAB — GLUCOSE, CAPILLARY
GLUCOSE-CAPILLARY: 107 mg/dL — AB (ref 70–99)
GLUCOSE-CAPILLARY: 127 mg/dL — AB (ref 70–99)
Glucose-Capillary: 114 mg/dL — ABNORMAL HIGH (ref 70–99)

## 2014-04-23 MED ORDER — BUMETANIDE 2 MG PO TABS
2.0000 mg | ORAL_TABLET | Freq: Two times a day (BID) | ORAL | Status: DC
  Administered 2014-04-23 – 2014-04-24 (×3): 2 mg via ORAL
  Filled 2014-04-23 (×5): qty 1

## 2014-04-23 MED ORDER — POTASSIUM CHLORIDE CRYS ER 20 MEQ PO TBCR
20.0000 meq | EXTENDED_RELEASE_TABLET | Freq: Once | ORAL | Status: AC
  Administered 2014-04-23: 20 meq via ORAL

## 2014-04-23 MED ORDER — PHENOL 1.4 % MT LIQD
1.0000 | OROMUCOSAL | Status: DC | PRN
  Administered 2014-04-23: 1 via OROMUCOSAL
  Filled 2014-04-23: qty 177

## 2014-04-23 NOTE — Progress Notes (Signed)
Primary cardiologist: Dr. Nicholes Mangoan Bensimhon  Subjective:   Feels much better today. Has had trouble sleeping the last few days.   Objective:   Temp:  [97.2 F (36.2 C)-98.5 F (36.9 C)] 97.2 F (36.2 C) (05/03 0534) Pulse Rate:  [48-106] 99 (05/03 0534) Resp:  [18-28] 20 (05/03 0534) BP: (93-126)/(42-111) 106/66 mmHg (05/03 0534) SpO2:  [94 %-100 %] 98 % (05/03 0534) FiO2 (%):  [96 %] 96 % (05/02 1235) Weight:  [339 lb (153.769 kg)-342 lb 11.2 oz (155.448 kg)] 342 lb 11.2 oz (155.448 kg) (05/03 0534) Last BM Date: 04/22/14  Filed Weights   04/22/14 1448 04/22/14 1929 04/23/14 0534  Weight: 342 lb (155.13 kg) 340 lb 1.6 oz (154.268 kg) 342 lb 11.2 oz (155.448 kg)    Intake/Output Summary (Last 24 hours) at 04/23/14 0914 Last data filed at 04/23/14 0641  Gross per 24 hour  Intake 660.17 ml  Output    225 ml  Net 435.17 ml    Exam:  General: Morbidly obese, no distress.  Lungs: Had decreased breath, few scattered rhonchi.  Cardiac: RRR, indistinct PMI.  Extremities: Mild edema, appears chronic.   Lab Results:  Basic Metabolic Panel:  Recent Labs Lab 04/22/14 1301 04/23/14 0400  NA 138 139  K 3.1* 3.2*  CL 92* 94*  CO2 30 31  GLUCOSE 124* 120*  BUN 15 17  CREATININE 1.20 1.30  CALCIUM 8.5 8.8    Liver Function Tests:  Recent Labs Lab 04/22/14 1301 04/23/14 0400  AST 18 20  ALT 13 15  ALKPHOS 65 71  BILITOT 1.5* 1.6*  PROT 7.6 7.6  ALBUMIN 3.2* 3.2*    CBC:  Recent Labs Lab 04/22/14 1301  WBC 5.4  HGB 9.3*  HCT 28.9*  MCV 79.0  PLT 298    Cardiac Enzymes:  Recent Labs Lab 04/22/14 1301  TROPONINI <0.30    BNP:  Recent Labs  01/11/14 1121 04/10/14 1859 04/22/14 1301  PROBNP 2156.0* 1531.0* 991.4*    Coagulation:  Recent Labs Lab 04/22/14 1301  INR 1.17    Imaging: CHEST 2 VIEW  COMPARISON: 03/2014  FINDINGS:  Cardiac silhouette is mildly enlarged. Normal mediastinal and hilar  contours.  Hazy airspace  opacity is seen, most prominent centrally. No pleural  effusion. No pneumothorax.  Bony thorax is intact. Right PICC has its tip in the mid superior  vena cava.  IMPRESSION:  Hazy central lung opacities suggest mild pulmonary edema, but could  reflect bilateral infectious or inflammatory infiltrates. Appearance  is similar to the most recent study.    Medications:   Scheduled Medications: . atorvastatin  80 mg Oral Daily  . carvedilol  3.125 mg Oral BID  . digoxin  0.125 mg Oral Daily  . enoxaparin (LOVENOX) injection  40 mg Subcutaneous Q24H  . furosemide  40 mg Intravenous BID  . hydrALAZINE  37.5 mg Oral 3 times per day  . insulin glargine  25 Units Subcutaneous QHS  . isosorbide mononitrate  60 mg Oral Daily  . magnesium oxide  400 mg Oral BID  . potassium chloride SA  40 mEq Oral BID     Infusions: . milrinone 0.375 mcg/kg/min (04/23/14 0641)     PRN Medications:  acetaminophen, albuterol, ALPRAZolam, nitroGLYCERIN, ondansetron (ZOFRAN) IV, sodium chloride, traMADol, zolpidem   Assessment:   1. Acute on chronic systolic heart, complicated by recent medication noncompliance, without of Coreg and hydralazine. He has otherwise been taking his remaining home medications and is on  milrinone infusion. States heart rate was up and had not been sleeping well, now feeling much better.  2. Nonischemic cardiomyopathy, LVEF approximately 35%. Low output state, on home milrinone.  3. Hypertension.  4. Type 2 diabetes mellitus.  5. Sleep apnea on BiPAP.  6. CKD, to stage 3 by history, Creatinine 1.3.  7. Hypokalemia.   Plan/Discussion:    Weight at last hospital discharge was 348 lbs - less than that now. I think most of his symptoms were due to being off Coreg and hydralazine over last week, he is feeling better now with resumption of therapy. Will convert back to Bumex 2 mg twice daily, follow intake and output until tomorrow. Cancel VQ scan for now. Further replete  potassium.   Jonelle Sidle, M.D., F.A.C.C.

## 2014-04-23 NOTE — Progress Notes (Signed)
Due to technical difficulties related to patient size, VQ scan will be of Anterior and anterior oblique images only, no posterior images possible (sometimes the most helpful). New camera being installed this week that will eliminate this difficulty, but it will not be running till mid-week at the earliest. Defer VQ scan till MD sees, may be able to do CT, may hold off completely if respiratory status improves enough.

## 2014-04-23 NOTE — Progress Notes (Signed)
Patient is able to place himself on/off BIPAP as needed.  Encouraged patient to call for assistance if needed.  Machine set up at bedside on 12/16.

## 2014-04-24 ENCOUNTER — Encounter (INDEPENDENT_AMBULATORY_CARE_PROVIDER_SITE_OTHER): Payer: Medicare Other | Admitting: General Surgery

## 2014-04-24 DIAGNOSIS — I5043 Acute on chronic combined systolic (congestive) and diastolic (congestive) heart failure: Secondary | ICD-10-CM

## 2014-04-24 LAB — CBC
HCT: 27.9 % — ABNORMAL LOW (ref 39.0–52.0)
Hemoglobin: 8.8 g/dL — ABNORMAL LOW (ref 13.0–17.0)
MCH: 25.4 pg — AB (ref 26.0–34.0)
MCHC: 31.5 g/dL (ref 30.0–36.0)
MCV: 80.4 fL (ref 78.0–100.0)
PLATELETS: 283 10*3/uL (ref 150–400)
RBC: 3.47 MIL/uL — ABNORMAL LOW (ref 4.22–5.81)
RDW: 19.4 % — AB (ref 11.5–15.5)
WBC: 4.9 10*3/uL (ref 4.0–10.5)

## 2014-04-24 LAB — BASIC METABOLIC PANEL
BUN: 19 mg/dL (ref 6–23)
CO2: 31 mEq/L (ref 19–32)
CREATININE: 1.39 mg/dL — AB (ref 0.50–1.35)
Calcium: 8.7 mg/dL (ref 8.4–10.5)
Chloride: 95 mEq/L — ABNORMAL LOW (ref 96–112)
GFR calc Af Amer: 75 mL/min — ABNORMAL LOW (ref >90)
GFR, EST NON AFRICAN AMERICAN: 64 mL/min — AB (ref >90)
GLUCOSE: 114 mg/dL — AB (ref 70–99)
Potassium: 3.2 mEq/L — ABNORMAL LOW (ref 3.7–5.3)
Sodium: 138 mEq/L (ref 137–147)

## 2014-04-24 LAB — GLUCOSE, CAPILLARY: Glucose-Capillary: 109 mg/dL — ABNORMAL HIGH (ref 70–99)

## 2014-04-24 MED ORDER — POTASSIUM CHLORIDE CRYS ER 20 MEQ PO TBCR
40.0000 meq | EXTENDED_RELEASE_TABLET | Freq: Once | ORAL | Status: AC
  Administered 2014-04-24: 40 meq via ORAL

## 2014-04-24 MED ORDER — ENOXAPARIN SODIUM 80 MG/0.8ML ~~LOC~~ SOLN
75.0000 mg | SUBCUTANEOUS | Status: DC
  Filled 2014-04-24: qty 0.8

## 2014-04-24 NOTE — Discharge Instructions (Signed)
Heart Failure Heart failure means your heart has trouble pumping blood. This makes it hard for your body to work well. Heart failure is usually a long-term (chronic) condition. You must take good care of yourself and follow your doctor's treatment plan. HOME CARE  Take your heart medicine as told by your doctor.  Do not stop taking medicine unless your doctor tells you to.  Do not skip any dose of medicine.  Refill your medicines before they run out.  Take other medicines only as told by your doctor or pharmacist.  Stay active if told by your doctor. The elderly and people with severe heart failure should talk with a doctor about physical activity.  Eat heart healthy foods. Choose foods that are without trans fat and are low in saturated fat, cholesterol, and salt (sodium). This includes fresh or frozen fruits and vegetables, fish, lean meats, fat-free or low-fat dairy foods, whole grains, and high-fiber foods. Lentils and dried peas and beans (legumes) are also good choices.  Limit salt if told by your doctor.  Cook in a healthy way. Roast, grill, broil, bake, poach, steam, or stir-fry foods.  Limit fluids as told by your doctor.  Weigh yourself every morning. Do this after you pee (urinate) and before you eat breakfast. Write down your weight to give to your doctor.  Take your blood pressure and write it down if your doctor tell you to.  Ask your doctor how to check your pulse. Check your pulse as told.  Lose weight if told by your doctor.  Stop smoking or chewing tobacco. Do not use gum or patches that help you quit without your doctor's approval.  Schedule and go to doctor visits as told.  Nonpregnant women should have no more than 1 drink a day. Men should have no more than 2 drinks a day. Talk to your doctor about drinking alcohol.  Stop illegal drug use.  Stay current with shots (immunizations).  Manage your health conditions as told by your doctor.  Learn to manage  your stress.  Rest when you are tired.  If it is really hot outside:  Avoid intense activities.  Use air conditioning or fans, or get in a cooler place.  Avoid caffeine and alcohol.  Wear loose-fitting, lightweight, and light-colored clothing.  If it is really cold outside:  Avoid intense activities.  Layer your clothing.  Wear mittens or gloves, a hat, and a scarf when going outside.  Avoid alcohol.  Learn about heart failure and get support as needed.  Get help to maintain or improve your quality of life and your ability to care for yourself as needed. GET HELP IF:   You gain 03 lb/1.4 kg or more in 1 day or 05 lb/2.3 kg in a week.  You are more short of breath than usual.  You cannot do your normal activities.  You tire easily.  You cough more than normal, especially with activity.  You have any or more puffiness (swelling) in areas such as your hands, feet, ankles, or belly (abdomen).  You cannot sleep because it is hard to breathe.  You feel like your heart is beating fast (palpitations).  You get dizzy or lightheaded when you stand up. GET HELP RIGHT AWAY IF:   You have trouble breathing.  There is a change in mental status, such as becoming less alert or not being able to focus.  You have chest pain or discomfort.  You faint. MAKE SURE YOU:   Understand these   instructions.  Will watch your condition.  Will get help right away if you are not doing well or get worse. Document Released: 09/16/2008 Document Revised: 04/04/2013 Document Reviewed: 07/08/2012 Exit[employer] Patient Information 2014 [employer].  

## 2014-04-24 NOTE — Progress Notes (Signed)
CSW contacted by nursing staff for CSW to assist patient with transportation home. Patient is being d/c'd today and CSW met with patient and his wife in his room. She rode the bus to get to the hospital and states that they do not have a car.  CSW discussed other possible options to get home- patient adamantly denies that he has anyone who can come and pick them up. They have only recently moved to [place] from [place] of state and do not have any friends or family here with transportation. Patient is also on Milrinone and his current meds are at home. Per nursing- patient has been cleared by MD to stop Milrinone in the hospital at d/c and then go straight home to be hooked up to his milrinone by [place].  Because of this issue- transportation via bus may take too long to get home.  CSW arranged for [employer] service to transport patient and his wife home. Voucher completed and given to patient's nurse [place] who will call taxi service once patient is ready to leave. No further CSW needs identified. CSW signing off.  Lorie Phenix. [place], [employer]

## 2014-04-24 NOTE — Care Management Note (Addendum)
  Page 1 of 1   04/24/2014     2:40:24 PM CARE MANAGEMENT NOTE 04/24/2014  Patient:  VELTON, BOU   Account Number:  [id number]  Date Initiated:  04/24/2014  Documentation initiated by:  Cheyla Duchemin  Subjective/Objective Assessment:   admitted with CP, SOB     Action/Plan:   CM to follow for dispositon needs   Anticipated DC Date:  04/24/2014   Anticipated DC Plan:  HOME W HOME HEALTH SERVICES      DC Planning Services  CM consult  Other      [facility] Choice  HOME HEALTH   Choice offered to / List presented to:  NA           [facility] agency  [facility]   Status of service:  Completed, signed off Medicare Important Message given?   (If response is "NO", the following Medicare IM given date fields will be blank) Date Medicare IM given:   Date Additional Medicare IM given:    Discharge Disposition:  HOME W HOME HEALTH SERVICES  Per UR Regulation:  Reviewed for med. necessity/level of care/duration of stay  If discussed at Long Length of Stay Meetings, dates discussed:    Comments:  Lagina Reader RN, BSN, MSHL, CCM  Nurse - Case Manager, ([place])  [phone]  04/24/2014 Dispositon: Home / HHS:  RN to manage milrinone 20 MG/100ML Soln infusion Inject 62.7375 mcg/min into the vein continuous. [facility]/contact Debbie notified of d/c via cab to home today. [facility]/Pam arranging HOme infusion.

## 2014-04-24 NOTE — Progress Notes (Signed)
Pt is being discharged home. Pt is being transported by a cab. Pt has been provided with discharge instructions. RN went over instructions with the patient and answered all questions the patient had.

## 2014-04-24 NOTE — Progress Notes (Signed)
Primary cardiologist: Dr. Nicholes Mango  Subjective:    Admitted with CP/SOB. He had been out of carvedilol and hydralazine for 1 week. He also missed HF appointment 04/20/14 due to transportation.  Given 40 mg IV lasix x 2 . Yesterday transitioned to bumex 2 mg twice a day.   Feeling great. Denies SOB/Orthopnea.      Objective:   Temp:  [97.5 F (36.4 C)-97.8 F (36.6 C)] 97.5 F (36.4 C) (05/04 0510) Pulse Rate:  [98-103] 103 (05/04 0510) Resp:  [18-21] 21 (05/04 0510) BP: (100-108)/(71-88) 100/71 mmHg (05/04 0510) SpO2:  [93 %-100 %] 100 % (05/04 0510) Weight:  [343 lb 0.6 oz (155.6 kg)] 343 lb 0.6 oz (155.6 kg) (05/04 0510) Last BM Date: 04/22/14  Filed Weights   04/22/14 1929 04/23/14 0534 04/24/14 0510  Weight: 340 lb 1.6 oz (154.268 kg) 342 lb 11.2 oz (155.448 kg) 343 lb 0.6 oz (155.6 kg)    Intake/Output Summary (Last 24 hours) at 04/24/14 0848 Last data filed at 04/24/14 4431  Gross per 24 hour  Intake 920.71 ml  Output   1675 ml  Net -754.29 ml    Exam:  General: Morbidly obese, no distress. Sitting on the side of the bed.  Cardiac: No S3 JVP diffciult to assess due to body habitus but does not appear elevated.  Lungs: Had decreased breath Cardiac: RRR, indistinct PMI. Extremities: No lower extremity edema.    Lab Results:  Basic Metabolic Panel:  Recent Labs Lab 04/22/14 1301 04/23/14 0400 04/24/14 0600  NA 138 139 138  K 3.1* 3.2* 3.2*  CL 92* 94* 95*  CO2 30 31 31   GLUCOSE 124* 120* 114*  BUN 15 17 19   CREATININE 1.20 1.30 1.39*  CALCIUM 8.5 8.8 8.7    Liver Function Tests:  Recent Labs Lab 04/22/14 1301 04/23/14 0400  AST 18 20  ALT 13 15  ALKPHOS 65 71  BILITOT 1.5* 1.6*  PROT 7.6 7.6  ALBUMIN 3.2* 3.2*    CBC:  Recent Labs Lab 04/22/14 1301 04/24/14 0600  WBC 5.4 4.9  HGB 9.3* 8.8*  HCT 28.9* 27.9*  MCV 79.0 80.4  PLT 298 283    Cardiac Enzymes:  Recent Labs Lab 04/22/14 1301  TROPONINI <0.30     BNP:  Recent Labs  01/11/14 1121 04/10/14 1859 04/22/14 1301  PROBNP 2156.0* 1531.0* 991.4*    Coagulation:  Recent Labs Lab 04/22/14 1301  INR 1.17    Imaging: CHEST 2 VIEW  COMPARISON: 03/2014  FINDINGS:  Cardiac silhouette is mildly enlarged. Normal mediastinal and hilar  contours.  Hazy airspace opacity is seen, most prominent centrally. No pleural  effusion. No pneumothorax.  Bony thorax is intact. Right PICC has its tip in the mid superior  vena cava.  IMPRESSION:  Hazy central lung opacities suggest mild pulmonary edema, but could  reflect bilateral infectious or inflammatory infiltrates. Appearance  is similar to the most recent study.    Medications:   Scheduled Medications: . atorvastatin  80 mg Oral Daily  . bumetanide  2 mg Oral BID  . carvedilol  3.125 mg Oral BID  . digoxin  0.125 mg Oral Daily  . enoxaparin (LOVENOX) injection  40 mg Subcutaneous Q24H  . hydrALAZINE  37.5 mg Oral 3 times per day  . insulin glargine  25 Units Subcutaneous QHS  . isosorbide mononitrate  60 mg Oral Daily  . magnesium oxide  400 mg Oral BID  . potassium chloride SA  40  mEq Oral BID    Infusions: . milrinone 0.375 mcg/kg/min (04/24/14 0824)    PRN Medications: acetaminophen, albuterol, ALPRAZolam, nitroGLYCERIN, ondansetron (ZOFRAN) IV, phenol, sodium chloride, traMADol, zolpidem   Assessment:   1. Acute on chronic systolic heart, complicated by recent medication noncompliance, without of Coreg and hydralazine. On chronic milrinone infusion 0.375 mcg   2. Nonischemic cardiomyopathy, LVEF approximately 35%. Low output state, on home milrinone.  3. Hypertension.- stable.   4. Type 2 diabetes mellitus.  5. OAS- Sleep apnea on BiPAP.  6. CKD, to stage 3 by history, Creatinine 1.3.  7. Hypokalemia.  8. Perirectal abscess- evaluated by Dr Derrell Lollingamirez 04/20/14 . Healed    Plan/Discussion:   Appears euvolemic. Restart home diuretic regimen. Continue  Milrinone at 0.375 mcg and dig 0.125 mg daily. Continue low dose carvedilol 3.125 mg twice a day. Continue hydralazine 37.5 mg tid and imdur 60 mg daily. Renal function stable.   Has follow up with EP tomorrow. Will see back in HF clinic 5/7 at 10:30.   Can discharge today with [facility] to follow for home milrinone.    Amy D Clegg NP-C  8:58 AM  Patient seen and examined with Tonye BecketAmy Clegg, NP. We discussed all aspects of the encounter. I agree with the assessment and plan as stated above.   OK for d/c today. Feeling much better after meds restated. Discussed strategies to make sure he does not run out of meds in future and what to do if he does. Volume status looks good. Can go home today. Has f/u with EP tomorrow to discuss CRT-D. Peri-rectal abscess apparently completely healed.   Bevelyn Bucklesaniel R Donn Zanetti,MD 9:16 AM

## 2014-04-24 NOTE — Discharge Summary (Signed)
Advanced Heart Failure Team  Discharge Summary   Patient ID: Steven Wyatt. MRN: [id number], DOB/AGE: 02/08/1976 38 y.o. Admit date: 04/22/2014 D/C date:     04/24/2014   Primary Discharge Diagnoses:  1. 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  2. NICM  3. Hypokalemia  4.  OAS- on BiPap 5. CKD Stage 3 - No Ace/Arb/Spiro . On low dose hydralazine/imdur 6.  DM 2  7. HTN 8. Obesity  9. Noncompliance    Hospital Course:  Steven Wyatt is a 38 yo male with a history of NICM (nor cors Oct 2014), chronic systolic HF ECHO 06/8294 EF 20% on chronic home milrinone via PICC, morbid obesity, OSA on BIPAP, HTN, CKD uncontrolled DM, and HLD. He has been followed closely in HF clinic chronic home Milrinone 0.375 mcg.   He was admitted with increased dyspnea and chest pain likely due to medication noncompliance. Prior to admit admit he was out of carvedilol and hydralazine for 1 week. On admit CEs negative and Pro BNP 991 which was lower than previous. He had mild volume overload and diuresed with IV lasix and as he improved he transitioned back to his home diuretic regimen. HF meds restarted without difficult.    Lengthy discussion occurred regarding medication compliance, low salt food choices, and limiting fluid intake to < 2 liters per day. He will continue to be followed closely in the HF clinic with follow up April 7. [facility] aware of this admit and I have asked them to verify medications at every visit. Will ask HF SW to meet with him at follow up appointment regarding transportation issues. He also has follow up with EP this week to consider CRT-D.    Discharge Weight Range: Discharge weight 343 pounds.  Discharge Vitals: Blood pressure 100/71, pulse 103, temperature 97.5 F (36.4 C), temperature source Oral, resp. rate 21, height 5' 8.5" (1.74 m), weight 343 lb 0.6 oz (155.6 kg), SpO2 100.00%.  Labs: Lab Results  Component Value Date   WBC 4.9  04/24/2014   HGB 8.8* 04/24/2014   HCT 27.9* 04/24/2014   MCV 80.4 04/24/2014   PLT 283 04/24/2014    Recent Labs Lab 04/23/14 0400 04/24/14 0600  NA 139 138  K 3.2* 3.2*  CL 94* 95*  CO2 31 31  BUN 17 19  CREATININE 1.30 1.39*  CALCIUM 8.8 8.7  PROT 7.6  --   BILITOT 1.6*  --   ALKPHOS 71  --   ALT 15  --   AST 20  --   GLUCOSE 120* 114*   No results found for this basename: CHOL, HDL, LDLCALC, TRIG   BNP (last 3 results)  Recent Labs  01/11/14 1121 04/10/14 1859 04/22/14 1301  PROBNP 2156.0* 1531.0* 991.4*    Diagnostic Studies/Procedures   Dg Chest 2 View  04/22/2014   CLINICAL DATA:  Short of breath.  Chest pain.  EXAM: CHEST  2 VIEW  COMPARISON:  03/2014  FINDINGS: Cardiac silhouette is mildly enlarged. Normal mediastinal and hilar contours.  Hazy airspace opacity is seen, most prominent centrally. No pleural effusion. No pneumothorax.  Bony thorax is intact. Right PICC has its tip in the mid superior vena cava.  IMPRESSION: Hazy central lung opacities suggest mild pulmonary edema, but could reflect bilateral infectious or inflammatory infiltrates. Appearance is similar to the most recent study.   Electronically Signed   By: Amie Portland M.D.   On: 04/22/2014  13:41    Discharge Medications     Medication List         acetaminophen 325 MG tablet  Commonly known as:  TYLENOL  Take 325 mg by mouth every 6 (six) hours as needed for mild pain.     albuterol 108 (90 BASE) MCG/ACT inhaler  Commonly known as:  PROVENTIL HFA;VENTOLIN HFA  Inhale 2 puffs into the lungs daily as needed for wheezing or shortness of breath.     atorvastatin 80 MG tablet  Commonly known as:  LIPITOR  Take 80 mg by mouth daily.     bumetanide 2 MG tablet  Commonly known as:  BUMEX  Take 2 tablets (4 mg total) by mouth 2 (two) times daily.     carvedilol 3.125 MG tablet  Commonly known as:  COREG  Take 1 tablet (3.125 mg total) by mouth 2 (two) times daily.     digoxin 0.125 MG tablet   Commonly known as:  LANOXIN  Take 1 tablet (0.125 mg total) by mouth daily.     hydrALAZINE 25 MG tablet  Commonly known as:  APRESOLINE  Take 1 tablet (25 mg total) by mouth every 8 (eight) hours.     insulin aspart 100 UNIT/ML FlexPen  Commonly known as:  NOVOLOG FLEXPEN  Inject 5 Units into the skin 3 (three) times daily with meals.     insulin glargine 100 UNIT/ML injection  Commonly known as:  LANTUS  Inject 0.25 mLs (25 Units total) into the skin at bedtime.     isosorbide mononitrate 30 MG 24 hr tablet  Commonly known as:  IMDUR  Take 1 tablet (30 mg total) by mouth daily.     magnesium oxide 400 MG tablet  Commonly known as:  MAG-OX  Take 1 tablet (400 mg total) by mouth 2 (two) times daily.     milrinone 20 MG/100ML Soln infusion  Commonly known as:  PRIMACOR  Inject 62.7375 mcg/min into the vein continuous.     potassium chloride SA 20 MEQ tablet  Commonly known as:  K-DUR,KLOR-CON  Take 2 tablets (40 mEq total) by mouth 2 (two) times daily.     traMADol 50 MG tablet  Commonly known as:  ULTRAM  Take 2 tablets (100 mg total) by mouth every 6 (six) hours as needed for moderate pain.        Disposition   The patient will be discharged in stable condition to home.     Discharge Orders   Future Appointments Provider Department Dept Phone   04/24/2014 4:15 PM Axel FillerArmando Ramirez, MD [facility] [place] [phone]   04/25/2014 4:00 PM Corwin LevinsJames W John, MD [facility] [phone]   04/27/2014 10:30 AM [facility] [phone]   Future Orders Complete By Expires   Contraindication to ACEI at discharge  As directed    Diet - low sodium heart healthy  As directed    Heart Failure patients record your daily weight using the same scale at the same time of day  As directed    Increase activity slowly  As directed      Follow-up Information   Follow up with Aundria Rudosgrove, Ali B, NP. (HF  clinic at 10:30 )    Specialty:  Nurse Practitioner   Contact information:   [address] [place] [address] [phone]         Duration of Discharge Encounter: Greater than 35 minutes  Signed, Adline Mango Clegg NP-C  04/24/2014, 11:31 AM  Patient seen and examined with Tonye Becket, NP. We discussed all aspects of the encounter. I agree with the assessment and plan as stated above.   He is ready for d/c. Has f/u in EP and HF clinics. Continue IV milrinone.   Bevelyn Buckles Skylen Danielsen,MD 2:41 PM

## 2014-04-25 ENCOUNTER — Ambulatory Visit: Payer: Medicare Other | Admitting: Internal Medicine

## 2014-04-25 ENCOUNTER — Telehealth: Payer: Self-pay | Admitting: Licensed Clinical Social Worker

## 2014-04-25 ENCOUNTER — Encounter (HOSPITAL_COMMUNITY): Payer: Self-pay | Admitting: Emergency Medicine

## 2014-04-25 ENCOUNTER — Emergency Department (HOSPITAL_COMMUNITY): Payer: Medicare Other

## 2014-04-25 ENCOUNTER — Telehealth: Payer: Self-pay | Admitting: *Deleted

## 2014-04-25 ENCOUNTER — Emergency Department (HOSPITAL_COMMUNITY)
Admission: EM | Admit: 2014-04-25 | Discharge: 2014-04-25 | Disposition: A | Discharge status: Home / Self Care | Payer: Medicare Other | Attending: Emergency Medicine | Admitting: Emergency Medicine

## 2014-04-25 DIAGNOSIS — I428 Other cardiomyopathies: Secondary | ICD-10-CM | POA: Insufficient documentation

## 2014-04-25 DIAGNOSIS — E876 Hypokalemia: Secondary | ICD-10-CM | POA: Insufficient documentation

## 2014-04-25 DIAGNOSIS — E785 Hyperlipidemia, unspecified: Secondary | ICD-10-CM | POA: Insufficient documentation

## 2014-04-25 DIAGNOSIS — F419 Anxiety disorder, unspecified: Secondary | ICD-10-CM

## 2014-04-25 DIAGNOSIS — N183 Chronic kidney disease, stage 3 unspecified: Secondary | ICD-10-CM | POA: Insufficient documentation

## 2014-04-25 DIAGNOSIS — IMO0001 Reserved for inherently not codable concepts without codable children: Secondary | ICD-10-CM | POA: Insufficient documentation

## 2014-04-25 DIAGNOSIS — Z794 Long term (current) use of insulin: Secondary | ICD-10-CM | POA: Insufficient documentation

## 2014-04-25 DIAGNOSIS — I5022 Chronic systolic (congestive) heart failure: Secondary | ICD-10-CM | POA: Insufficient documentation

## 2014-04-25 DIAGNOSIS — Z6841 Body Mass Index (BMI) 40.0 and over, adult: Secondary | ICD-10-CM | POA: Insufficient documentation

## 2014-04-25 DIAGNOSIS — R0602 Shortness of breath: Secondary | ICD-10-CM

## 2014-04-25 DIAGNOSIS — J45909 Unspecified asthma, uncomplicated: Secondary | ICD-10-CM | POA: Insufficient documentation

## 2014-04-25 DIAGNOSIS — F411 Generalized anxiety disorder: Secondary | ICD-10-CM

## 2014-04-25 DIAGNOSIS — I129 Hypertensive chronic kidney disease with stage 1 through stage 4 chronic kidney disease, or unspecified chronic kidney disease: Secondary | ICD-10-CM | POA: Insufficient documentation

## 2014-04-25 DIAGNOSIS — G4733 Obstructive sleep apnea (adult) (pediatric): Secondary | ICD-10-CM | POA: Insufficient documentation

## 2014-04-25 DIAGNOSIS — R079 Chest pain, unspecified: Secondary | ICD-10-CM | POA: Insufficient documentation

## 2014-04-25 DIAGNOSIS — D509 Iron deficiency anemia, unspecified: Secondary | ICD-10-CM | POA: Insufficient documentation

## 2014-04-25 DIAGNOSIS — E1165 Type 2 diabetes mellitus with hyperglycemia: Secondary | ICD-10-CM

## 2014-04-25 LAB — BASIC METABOLIC PANEL
BUN: 20 mg/dL (ref 6–23)
CO2: 27 mEq/L (ref 19–32)
CREATININE: 1.46 mg/dL — AB (ref 0.50–1.35)
Calcium: 9.1 mg/dL (ref 8.4–10.5)
Chloride: 94 mEq/L — ABNORMAL LOW (ref 96–112)
GFR calc non Af Amer: 61 mL/min — ABNORMAL LOW (ref >90)
GFR, EST AFRICAN AMERICAN: 70 mL/min — AB (ref >90)
GLUCOSE: 134 mg/dL — AB (ref 70–99)
Potassium: 3.5 mEq/L — ABNORMAL LOW (ref 3.7–5.3)
Sodium: 135 mEq/L — ABNORMAL LOW (ref 137–147)

## 2014-04-25 LAB — CBC WITH DIFFERENTIAL/PLATELET
BASOS PCT: 1 % (ref 0–1)
Basophils Absolute: 0 10*3/uL (ref 0.0–0.1)
EOS PCT: 2 % (ref 0–5)
Eosinophils Absolute: 0.1 10*3/uL (ref 0.0–0.7)
HCT: 27.1 % — ABNORMAL LOW (ref 39.0–52.0)
Hemoglobin: 8.6 g/dL — ABNORMAL LOW (ref 13.0–17.0)
Lymphocytes Relative: 15 % (ref 12–46)
Lymphs Abs: 0.8 10*3/uL (ref 0.7–4.0)
MCH: 25.4 pg — AB (ref 26.0–34.0)
MCHC: 31.7 g/dL (ref 30.0–36.0)
MCV: 79.9 fL (ref 78.0–100.0)
MONO ABS: 0.3 10*3/uL (ref 0.1–1.0)
Monocytes Relative: 6 % (ref 3–12)
Neutro Abs: 4 10*3/uL (ref 1.7–7.7)
Neutrophils Relative %: 76 % (ref 43–77)
Platelets: 291 10*3/uL (ref 150–400)
RBC: 3.39 MIL/uL — ABNORMAL LOW (ref 4.22–5.81)
RDW: 19.7 % — ABNORMAL HIGH (ref 11.5–15.5)
WBC: 5.2 10*3/uL (ref 4.0–10.5)

## 2014-04-25 LAB — TROPONIN I: Troponin I: <0.3 ng/mL (ref <0.30)

## 2014-04-25 LAB — PRO B NATRIURETIC PEPTIDE: Pro B Natriuretic peptide (BNP): 1483 pg/mL — ABNORMAL HIGH (ref 0–125)

## 2014-04-25 MED ORDER — POTASSIUM CHLORIDE CRYS ER 20 MEQ PO TBCR
40.0000 meq | EXTENDED_RELEASE_TABLET | Freq: Once | ORAL | Status: AC
  Administered 2014-04-25: 40 meq via ORAL
  Filled 2014-04-25: qty 2

## 2014-04-25 MED ORDER — TRAMADOL HCL 50 MG PO TABS
50.0000 mg | ORAL_TABLET | Freq: Once | ORAL | Status: AC
  Administered 2014-04-25: 50 mg via ORAL
  Filled 2014-04-25: qty 1

## 2014-04-25 MED ORDER — LORAZEPAM 1 MG PO TABS
1.0000 mg | ORAL_TABLET | ORAL | Status: DC | PRN
  Administered 2014-04-25: 1 mg via ORAL
  Filled 2014-04-25: qty 1

## 2014-04-25 MED ORDER — FENTANYL CITRATE 0.05 MG/ML IJ SOLN
50.0000 ug | INTRAMUSCULAR | Status: DC | PRN
  Administered 2014-04-25: 50 ug via INTRAVENOUS
  Filled 2014-04-25: qty 2

## 2014-04-25 NOTE — Discharge Instructions (Signed)
Your chronic heart disease is End Stage.  You will not tolerate activity without shortness of breath.  Continue your current medications, and treatment.

## 2014-04-25 NOTE — Progress Notes (Signed)
[facility]  Patient Status: Active pt with [facility] up to this readmission  [facility] is providing the following services: [facility] ([facility]), MSW, [facility] for home Milrinone therapy. [facility] in hospital team will follow and support DC home when deemed appropriate.   If patient discharges after hours, please call [phone].   [name] 04/25/2014, 3:40 PM

## 2014-04-25 NOTE — ED Notes (Addendum)
Patient arrived via [facility] from doctors office outside. Patient became short of breath placed on 2L [place] and started having chest pain described as tightness. EKG ST, VSS, A/O. Patient was discharged from ER yesterday with CHF. Family at bedside. No meds given by EMS. Patient has a DL picc in the right upper arm with Milrinone infusing by travel pump.

## 2014-04-25 NOTE — ED Provider Notes (Signed)
CSN: [id number]     Arrival date & time 04/25/14  1255 History   First MD Initiated Contact with Patient 04/25/14 1453     Chief Complaint  Patient presents with  . Shortness of Breath  . Chest Pain      HPI  Patient presents with a complaint of chest pain shortness of breath.  Has a complicated history of nonischemic cardiomyopathy and chronic congestive heart failure. He also took his sleep apnea morbid obesity. He is on home O2. He is on BiPAP, room air, at night. Just discharged yesterday after diuresis for a CHF exacerbation. Cardiologist is Dr. Clarise CruzBenshimon.  Patient left the hospital yesterday. He was wearing his life less last night. Felt palpitations and it was giving a beeping sound. He states he can cancel his shocks with pushing the button and he did so. He did not have syncope or lightheadedness. No palpitations. Has some pain was transient. He slept through the night without his last. He was up this morning. He states he was going to an appointment today for consultation regarding possibility of a left ventricular assist device. He states that appointment was either canceled or had been confused. He turned around and was trying to walk back to his car. He states it is hot he began feeling short of breath having chest pain. He cannot go any further. He could not walk back into the office. A nurse in the office noticed he was having trouble and called 911. He was transferred a short distance here to the ER.    He is less short of breath here laying supine on 2 L O2. He states he some pain in his chest. States the pain is just an almost constant for the last few weeks.   Past Medical History  Diagnosis Date  . Chronic systolic CHF (congestive heart failure)     a. on home milrinone - dose titrate to 0.38675mcg/kg/min 03/2014.  . Diabetes   . HTN (hypertension)   . Morbid obesity with BMI of 50.0-59.9, adult   . Sleep apnea     on Bi Pap  . Dyslipidemia   . CKD (chronic kidney disease),  stage III   . Asthma 01/03/2014  . DKA (diabetic ketoacidoses) 02/25/2014  . Hypokalemia 01/04/2014  . Hyponatremia 02/25/2014  . Microcytic anemia 01/04/2014  . 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 [facility]- [facility]  . History of chicken pox   . Perirectal abscess 03/2014   Past Surgical History  Procedure Laterality Date  . Incision and drainage abscess N/A 03/31/2014    Procedure: INCISION AND DRAINAGE  PERI-RECTAL ABSCESS;  Surgeon: Axel FillerArmando Ramirez, MD;  Location: [facility] OR;  Service: General;  Laterality: N/A;   Family History  Problem Relation Age of Onset  . Hypertension Mother   . Diabetes Maternal Grandmother    History  Substance Use Topics  . Smoking status: Never Smoker   . Smokeless tobacco: Never Used  . Alcohol Use: No    Review of Systems  Constitutional: Positive for fatigue. Negative for fever, chills, diaphoresis and appetite change.  HENT: Negative for mouth sores, sore throat and trouble swallowing.   Eyes: Negative for visual disturbance.  Respiratory: Positive for chest tightness and shortness of breath. Negative for cough and wheezing.   Cardiovascular: Positive for chest pain and leg swelling.  Gastrointestinal: Negative for nausea, vomiting, abdominal pain, diarrhea and abdominal distention.  Endocrine: Negative for polydipsia, polyphagia and polyuria.  Genitourinary: Negative for dysuria, frequency and hematuria.  Musculoskeletal: Negative for gait problem.  Skin: Negative for color change, pallor and rash.  Neurological: Negative for dizziness, syncope, light-headedness and headaches.  Hematological: Does not bruise/bleed easily.  Psychiatric/Behavioral: Negative for behavioral problems and confusion.      Allergies  Iodine and Shellfish allergy  Home Medications   Prior to Admission medications   Medication Sig Start Date End Date Taking? Authorizing Provider  acetaminophen (TYLENOL) 325  MG tablet Take 325 mg by mouth every 6 (six) hours as needed for mild pain.   Yes Historical Provider, MD  albuterol (PROVENTIL HFA;VENTOLIN HFA) 108 (90 BASE) MCG/ACT inhaler Inhale 2 puffs into the lungs daily as needed for wheezing or shortness of breath. 01/08/14  Yes Wilburt Finlay, PA-C  aspirin 500 MG tablet Take 100 mg by mouth every 6 (six) hours as needed for pain.   Yes Historical Provider, MD  atorvastatin (LIPITOR) 80 MG tablet Take 80 mg by mouth daily. 01/08/14  Yes Wilburt Finlay, PA-C  bumetanide (BUMEX) 2 MG tablet Take 2 tablets (4 mg total) by mouth 2 (two) times daily. 01/16/14  Yes Amy D Clegg, NP  carvedilol (COREG) 3.125 MG tablet Take 1 tablet (3.125 mg total) by mouth 2 (two) times daily. 02/20/14  Yes Dolores Patty, MD  digoxin (LANOXIN) 0.125 MG tablet Take 1 tablet (0.125 mg total) by mouth daily. 01/16/14  Yes Amy D Clegg, NP  hydrALAZINE (APRESOLINE) 25 MG tablet Take 1 tablet (25 mg total) by mouth every 8 (eight) hours. 01/23/14  Yes Amy D Clegg, NP  insulin aspart (NOVOLOG FLEXPEN) 100 UNIT/ML FlexPen Inject 1-15 Units into the skin 3 (three) times daily with meals. Sliding scale   Yes Historical Provider, MD  insulin glargine (LANTUS) 100 UNIT/ML injection Inject 0.25 mLs (25 Units total) into the skin at bedtime. 02/26/14  Yes Hollice Espy, MD  isosorbide mononitrate (IMDUR) 30 MG 24 hr tablet Take 1 tablet (30 mg total) by mouth daily. 01/16/14  Yes Amy D Clegg, NP  magnesium oxide (MAG-OX) 400 MG tablet Take 1 tablet (400 mg total) by mouth 2 (two) times daily. 03/07/14  Yes Aundria Rud, NP  milrinone [facility]) 20 MG/100ML SOLN infusion Inject 62.7375 mcg/min into the vein continuous. 04/15/14  Yes Ok Anis, NP  potassium chloride SA (K-DUR,KLOR-CON) 20 MEQ tablet Take 2 tablets (40 mEq total) by mouth 2 (two) times daily. 04/15/14  Yes Ok Anis, NP  traMADol (ULTRAM) 50 MG tablet Take 2 tablets (100 mg total) by mouth every 6 (six) hours as needed  for moderate pain. 04/15/14  Yes Ok Anis, NP   BP 100/65  Pulse 99  Resp 13  SpO2 98% Physical Exam  Constitutional:  Morbidly obese African American male. Weight today is 343. He states the lower in the last few months is 331. The highest he has been is 407 in January before some extensive diuresis.  HENT:  Conjunctiva are not pale  Neck:  Short thick neck. Cannot appreciate JVD.  Cardiovascular: S1 normal.   History. Sinus rhythm on the monitor. Occasional PACs and PVCs without coupled ectopy.  Pulmonary/Chest:  Bibasilar crackles. Not extensive crackling.  Skin:  Symmetric 3+ lower extremity edema    ED Course  Procedures (including critical care time) Labs Review Labs Reviewed  CBC WITH DIFFERENTIAL - Abnormal; Notable for the following:    RBC 3.39 (*)    Hemoglobin 8.6 (*)    HCT 27.1 (*)  MCH 25.4 (*)    RDW 19.7 (*)    All other components within normal limits  BASIC METABOLIC PANEL - Abnormal; Notable for the following:    Sodium 135 (*)    Potassium 3.5 (*)    Chloride 94 (*)    Glucose, Bld 134 (*)    Creatinine, Ser 1.46 (*)    GFR calc non Af Amer 61 (*)    GFR calc Af Amer 70 (*)    All other components within normal limits  PRO B NATRIURETIC PEPTIDE - Abnormal; Notable for the following:    Pro B Natriuretic peptide (BNP) 1483.0 (*)    All other components within normal limits  TROPONIN I  URINALYSIS, ROUTINE W REFLEX MICROSCOPIC    Imaging Review Dg Chest Port 1 View  04/25/2014   CLINICAL DATA:  Shortness of breath with chest pain.  EXAM: PORTABLE CHEST - 1 VIEW  COMPARISON:  04/22/2014.  FINDINGS: Moderate enlargement cardiac silhouette, likely cardiomegaly. Worsening bilateral pulmonary opacities, likely pulmonary edema. No effusion or pneumothorax. PICC line tip remains at the proximal SVC. Increased body habitus.  IMPRESSION: Worsening aeration. Developing pulmonary edema. Moderate cardiac silhouette enlargement.   Electronically  Signed   By: Davonna Belling M.D.   On: 04/25/2014 14:30     EKG Interpretation   Date/Time:  Tuesday Apr 25 2014 13:19:20 EDT Ventricular Rate:  102 PR Interval:  189 QRS Duration: 143 QT Interval:  387 QTC Calculation: 504 R Axis:   -62 Text Interpretation:  Sinus tachycardia Left bundle branch block since  last tracing no significant change Confirmed by Effie Shy  MD, Mechele Collin ([id number])  on 04/25/2014 1:47:36 PM      MDM   Final diagnoses:  Chest pain  Anxiety  Chronic systolic heart failure  Hypokalemia    Chest x-ray suggests cardiomegaly with pulmonary edema. This does not appear significantly change versus his most recent comparison. He is dyspneic and short of breath with minimal to no exertion. He is on maximum treatment. Remainder of his labs are pending. EKG shows no acute changes her rhythm change. We'll discuss the case with his cardiologist regarding advice for continued treatment.  16:18:  Dr. Teena Dunk the patient's CHF cardiologist is here and evaluated the patient. He is discussed patient's findings and plan with him at length.  He has recommended  simple symptom control and discharge home.  Pt is not happy.  He is profane and belligerent to myself and staff.  He is able to express his displeasure with fluent speech, and without conversational dyspnea.  I have reiterated Dr. Traci Sermon assessment to the patient.  I also tried to help him understand the end stage nature of his disease.  He is reticent to engage in purposeful conversation.  He will be discharged.    Rolland Porter, MD 04/25/14 [phone]

## 2014-04-25 NOTE — Consult Note (Signed)
Referring Physician: ER Primary Cardiologist: DM/DB Reason for Consultation: CP   HPI:  [name] is a 38 yo male with a history of NICM (nor cors Oct 2014), chronic systolic HF ECHO 12/6107 EF 20% on chronic home milrinone via PICC, morbid obesity, OSA on BIPAP, HTN, CKD uncontrolled DM, and HLD. He has been followed closely in HF clinic chronic home Milrinone 0.375 mcg.   He was admitted over the weekend and discharged yesterday due to mild HF flare in setting of medication noncompliance.  Today was supposed to have EP appointment but arrived at EP office and found not to have appointment. Got very stressed out and has been anxious all day calling clinic multiple times. Finally came to ER c/o chest and back pain and dyspnea. W/u unrevealing.    Review of Systems:     Cardiac Review of Systems: {Y] = yes [ ]  = no  Chest Pain [  y  ]  Resting SOB [ y  ] Exertional SOB  [  y]  Orthopnea [  ]   Pedal Edema [   ]    Palpitations [  ] Syncope  [  ]   Presyncope [   ]  General Review of Systems: [Y] = yes [  ]=no Constitional: recent weight change [  ]; anorexia [  ]; fatigue [  ]; nausea [  ]; night sweats [  ]; fever [  ]; or chills [  ];                                                                     Eyes : blurred vision [  ]; diplopia [   ]; vision changes [  ];  Amaurosis fugax[  ]; Resp: cough [  ];  wheezing[  ];  hemoptysis[  ];  PND [  ];  GI:  gallstones[  ], vomiting[  ];  dysphagia[  ]; melena[  ];  hematochezia [  ]; heartburn[  ];   GU: kidney stones [  ]; hematuria[  ];   dysuria [  ];  nocturia[  ]; incontinence [  ];             Skin: rash, swelling[  ];, hair loss[  ];  peripheral edema[  ];  or itching[  ]; Musculosketetal: myalgias[  ];  joint swelling[  ];  joint erythema[  ];  joint pain[y  ];  back pain[y  ];  Heme/Lymph: bruising[  ];  bleeding[  ];  anemia[  ];  Neuro: TIA[  ];  headaches[  ];  stroke[  ];  vertigo[  ];  seizures[  ];   paresthesias[  ];   difficulty walking[  ];  Psych:depression[  ]; anxiety[  y];  Endocrine: diabetes[  ];  thyroid dysfunction[  ];  Other:  Past Medical History  Diagnosis Date  . Chronic systolic CHF (congestive heart failure)     a. on home milrinone - dose titrate to 0.329mcg/kg/min 03/2014.  . Diabetes   . HTN (hypertension)   . Morbid obesity with BMI of 50.0-59.9, adult   . Sleep apnea     on Bi Pap  . Dyslipidemia   . CKD (chronic kidney disease), stage  III   . Asthma 01/03/2014  . DKA (diabetic ketoacidoses) 02/25/2014  . Hypokalemia 01/04/2014  . Hyponatremia 02/25/2014  . Microcytic anemia 01/04/2014  . 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 [facility] arteries- [facility]  . History of chicken pox   . Perirectal abscess 03/2014     (Not in a hospital admission)     Infusions:   Allergies  Allergen Reactions  . Iodine Anaphylaxis  . Shellfish Allergy Anaphylaxis    History   Social History  . Marital Status: Married    Spouse Name: N/A    Number of Children: N/A  . Years of Education: N/A   Occupational History  . Clinical Social Worker    Social History Main Topics  . Smoking status: Never Smoker   . Smokeless tobacco: Never Used  . Alcohol Use: No  . Drug Use: No  . Sexual Activity: Yes   Other Topics Concern  . Not on file   Social History Narrative   Lives with wife and 2/5 children.    Family History  Problem Relation Age of Onset  . Hypertension Mother   . Diabetes Maternal Grandmother     PHYSICAL EXAM: Filed Vitals:   04/25/14 1430  BP: 100/65  Pulse: 99  Resp: 13    No intake or output data in the 24 hours ending 04/25/14 1547  General: Morbidly obese, no distress. Sitting on the side of the bed.  Cardiac: No S3 JVP diffciult to assess due to body habitus but does not appear elevated.  Lungs: Clear Cardiac: RRR, indistinct PMI.  Extremities: No lower extremity edema. Warm. Chronic venous stasis  changes Neuro stressed out. No focal abnormlaities   ECG: Sinus tach 102. LBBB  Results for orders placed during the hospital encounter of 04/25/14 (from the past 24 hour(s))  CBC WITH DIFFERENTIAL     Status: Abnormal   Collection Time    04/25/14  3:00 PM      Result Value Ref Range   WBC 5.2  4.0 - 10.5 K/uL   RBC 3.39 (*) 4.22 - 5.81 MIL/uL   Hemoglobin 8.6 (*) 13.0 - 17.0 g/dL   HCT 16.127.1 (*) 09.639.0 - 04.552.0 %   MCV 79.9  78.0 - 100.0 fL   MCH 25.4 (*) 26.0 - 34.0 pg   MCHC 31.7  30.0 - 36.0 g/dL   RDW 40.919.7 (*) 81.111.5 - 91.415.5 %   Platelets 291  150 - 400 K/uL   Neutrophils Relative % 76  43 - 77 %   Neutro Abs 4.0  1.7 - 7.7 K/uL   Lymphocytes Relative 15  12 - 46 %   Lymphs Abs 0.8  0.7 - 4.0 K/uL   Monocytes Relative 6  3 - 12 %   Monocytes Absolute 0.3  0.1 - 1.0 K/uL   Eosinophils Relative 2  0 - 5 %   Eosinophils Absolute 0.1  0.0 - 0.7 K/uL   Basophils Relative 1  0 - 1 %   Basophils Absolute 0.0  0.0 - 0.1 K/uL  BASIC METABOLIC PANEL     Status: Abnormal   Collection Time    04/25/14  3:00 PM      Result Value Ref Range   Sodium 135 (*) 137 - 147 mEq/L   Potassium 3.5 (*) 3.7 - 5.3 mEq/L   Chloride 94 (*) 96 - 112 mEq/L   CO2 27  19 - 32 mEq/L   Glucose, Bld  134 (*) 70 - 99 mg/dL   BUN 20  6 - 23 mg/dL   Creatinine, Ser 2.59 (*) 0.50 - 1.35 mg/dL   Calcium 9.1  8.4 - 56.3 mg/dL   GFR calc non Af Amer 61 (*) >90 mL/min   GFR calc Af Amer 70 (*) >90 mL/min  TROPONIN I     Status: None   Collection Time    04/25/14  3:00 PM      Result Value Ref Range   Troponin I <0.30  <0.30 ng/mL  PRO B NATRIURETIC PEPTIDE     Status: Abnormal   Collection Time    04/25/14  3:00 PM      Result Value Ref Range   Pro B Natriuretic peptide (BNP) 1483.0 (*) 0 - 125 pg/mL   Dg Chest Port 1 View  04/25/2014   CLINICAL DATA:  Shortness of breath with chest pain.  EXAM: PORTABLE CHEST - 1 VIEW  COMPARISON:  04/22/2014.  FINDINGS: Moderate enlargement cardiac silhouette, likely  cardiomegaly. Worsening bilateral pulmonary opacities, likely pulmonary edema. No effusion or pneumothorax. PICC line tip remains at the proximal SVC. Increased body habitus.  IMPRESSION: Worsening aeration. Developing pulmonary edema. Moderate cardiac silhouette enlargement.   Electronically Signed   By: Davonna Belling M.D.   On: 04/25/2014 14:30     ASSESSMENT: 1. Anxiety 2. Dyspnea  3. Back and chest pain 4. Chronic systolic HF due to NICM.  5. Chronic renal failure, stage IV 6. hypokalemia  PLAN/DISCUSSION:  Symptoms likely due to anxiety. HF looks good. CP not ischemic. Would supp K+ and give him small dose of benzos for anxiety and let him go home to f/u in HF clinic. We will arrange EP f/u as well.   D/w ER team.   Bevelyn Buckles Larenzo Caples,MD 3:56 PM

## 2014-04-25 NOTE — ED Notes (Signed)
Patient takes Tramadol 50mg  x2. Order originally entered for only 50mg .

## 2014-04-25 NOTE — Telephone Encounter (Signed)
Patient arrived thinking his appt was today but it is actually with another provider in another practice. On his way out, he was in the building lobby/front drive area and reported acute chest pain. He saw nurse Cloyde Reams, RN) walking by and indicated that he was having acute CP.  She did quick assessment. Steven Wyatt, [name], stayed with patient while nurse went to get wheelchair/equipment/notify Admin to call 911. Nurse responded back with wheelchair. Code Team responded. Patient remained weak but mobile and was assisted to wheelchair. VS 110/72, 88, Sats dropped from 94-90%. Stated his CP started when he first walked to bus stop on way to appt. Stated he was just released from [facility] yesterday with Dx: CHF. Skin warm, dry. Resps even with mild SOB noted. Pulse regular. Patient verbal, alert and oriented. Ambulance came within 2-3 minutes. Patient mobile and put himself on stretcher with assistance.  Patient transported at this time to [facility] ED. Patient had family with him, woman and young male child. Routed information to Ward Givens, NP, and Theodore Demark, PA.

## 2014-04-25 NOTE — Telephone Encounter (Signed)
CSW referred to assist with resources and support. CSW left message for patient to return call. Patient has scheduled follow up appointment on 04/27/14. Lasandra Beech, LCSW [phone]

## 2014-04-25 NOTE — ED Notes (Signed)
Patient is angry and has been very loud and upset. Attempts have been made to settle him down. He states "I get this every time I come in here, these people don't know how to take care of CHF patients and I am in pain". Spoke with the EDP and obtained verbal orders to give the patient his home pain medication. I also explained to the patient and family at bedside what to expect next in his care.

## 2014-04-26 ENCOUNTER — Other Ambulatory Visit (HOSPITAL_COMMUNITY): Payer: Self-pay | Admitting: Cardiology

## 2014-04-26 ENCOUNTER — Telehealth: Payer: Self-pay | Admitting: Licensed Clinical Social Worker

## 2014-04-26 DIAGNOSIS — I5043 Acute on chronic combined systolic (congestive) and diastolic (congestive) heart failure: Secondary | ICD-10-CM

## 2014-04-26 NOTE — Telephone Encounter (Signed)
CSW contacted patient to remind of clinic appointment tomorrow and assure patient has transportation. Message left for return call. Lasandra Beech, LCSW [phone]

## 2014-04-27 ENCOUNTER — Encounter (HOSPITAL_COMMUNITY): Payer: Self-pay

## 2014-04-27 ENCOUNTER — Encounter: Payer: Self-pay | Admitting: Internal Medicine

## 2014-04-27 ENCOUNTER — Ambulatory Visit (INDEPENDENT_AMBULATORY_CARE_PROVIDER_SITE_OTHER): Payer: Medicare Other | Admitting: Internal Medicine

## 2014-04-27 ENCOUNTER — Encounter: Payer: Self-pay | Admitting: Licensed Clinical Social Worker

## 2014-04-27 ENCOUNTER — Ambulatory Visit (HOSPITAL_BASED_OUTPATIENT_CLINIC_OR_DEPARTMENT_OTHER)
Admit: 2014-04-27 | Discharge: 2014-04-27 | Disposition: A | Discharge status: Home / Self Care | Payer: Medicare Other | Attending: Internal Medicine | Admitting: Internal Medicine

## 2014-04-27 VITALS — BP 120/82 | HR 99 | Wt 345.8 lb

## 2014-04-27 VITALS — BP 98/78 | HR 106 | Temp 98.7°F | Ht 68.0 in | Wt 345.8 lb

## 2014-04-27 DIAGNOSIS — E119 Type 2 diabetes mellitus without complications: Secondary | ICD-10-CM

## 2014-04-27 DIAGNOSIS — Z91199 Patient's noncompliance with other medical treatment and regimen due to unspecified reason: Secondary | ICD-10-CM

## 2014-04-27 DIAGNOSIS — J45909 Unspecified asthma, uncomplicated: Secondary | ICD-10-CM | POA: Diagnosis present

## 2014-04-27 DIAGNOSIS — Z7982 Long term (current) use of aspirin: Secondary | ICD-10-CM

## 2014-04-27 DIAGNOSIS — Z79899 Other long term (current) drug therapy: Secondary | ICD-10-CM | POA: Insufficient documentation

## 2014-04-27 DIAGNOSIS — E1165 Type 2 diabetes mellitus with hyperglycemia: Secondary | ICD-10-CM

## 2014-04-27 DIAGNOSIS — R0602 Shortness of breath: Secondary | ICD-10-CM | POA: Diagnosis not present

## 2014-04-27 DIAGNOSIS — G4733 Obstructive sleep apnea (adult) (pediatric): Secondary | ICD-10-CM | POA: Diagnosis present

## 2014-04-27 DIAGNOSIS — I129 Hypertensive chronic kidney disease with stage 1 through stage 4 chronic kidney disease, or unspecified chronic kidney disease: Secondary | ICD-10-CM | POA: Diagnosis present

## 2014-04-27 DIAGNOSIS — N189 Chronic kidney disease, unspecified: Secondary | ICD-10-CM

## 2014-04-27 DIAGNOSIS — R000 Tachycardia, unspecified: Secondary | ICD-10-CM | POA: Diagnosis not present

## 2014-04-27 DIAGNOSIS — I5022 Chronic systolic (congestive) heart failure: Secondary | ICD-10-CM | POA: Insufficient documentation

## 2014-04-27 DIAGNOSIS — Z833 Family history of diabetes mellitus: Secondary | ICD-10-CM

## 2014-04-27 DIAGNOSIS — I10 Essential (primary) hypertension: Secondary | ICD-10-CM

## 2014-04-27 DIAGNOSIS — IMO0001 Reserved for inherently not codable concepts without codable children: Secondary | ICD-10-CM | POA: Diagnosis present

## 2014-04-27 DIAGNOSIS — N183 Chronic kidney disease, stage 3 unspecified: Secondary | ICD-10-CM | POA: Diagnosis present

## 2014-04-27 DIAGNOSIS — J309 Allergic rhinitis, unspecified: Secondary | ICD-10-CM

## 2014-04-27 DIAGNOSIS — I428 Other cardiomyopathies: Secondary | ICD-10-CM

## 2014-04-27 DIAGNOSIS — Z6841 Body Mass Index (BMI) 40.0 and over, adult: Secondary | ICD-10-CM

## 2014-04-27 DIAGNOSIS — E785 Hyperlipidemia, unspecified: Secondary | ICD-10-CM

## 2014-04-27 DIAGNOSIS — Z794 Long term (current) use of insulin: Secondary | ICD-10-CM

## 2014-04-27 DIAGNOSIS — R002 Palpitations: Secondary | ICD-10-CM | POA: Diagnosis present

## 2014-04-27 DIAGNOSIS — F411 Generalized anxiety disorder: Secondary | ICD-10-CM

## 2014-04-27 DIAGNOSIS — F419 Anxiety disorder, unspecified: Secondary | ICD-10-CM

## 2014-04-27 DIAGNOSIS — I447 Left bundle-branch block, unspecified: Secondary | ICD-10-CM | POA: Diagnosis present

## 2014-04-27 DIAGNOSIS — Z9119 Patient's noncompliance with other medical treatment and regimen: Secondary | ICD-10-CM

## 2014-04-27 MED ORDER — INSULIN GLARGINE 100 UNIT/ML ~~LOC~~ SOLN: 25.0000 [IU] | Freq: Every day | SUBCUTANEOUS | Status: DC

## 2014-04-27 MED ORDER — INSULIN ASPART 100 UNIT/ML FLEXPEN
1.0000 [IU] | PEN_INJECTOR | Freq: Three times a day (TID) | SUBCUTANEOUS | Status: AC
Start: 2014-04-27 — End: ?

## 2014-04-27 MED ORDER — CLONAZEPAM 0.5 MG PO TABS: 0.5000 mg | ORAL_TABLET | Freq: Two times a day (BID) | ORAL | Status: DC | PRN

## 2014-04-27 MED ORDER — GLUCOSE BLOOD VI STRP
ORAL_STRIP | Status: DC
Start: 2014-04-27 — End: 2014-04-27

## 2014-04-27 MED ORDER — LORATADINE 10 MG PO TABS: 10.0000 mg | ORAL_TABLET | Freq: Every day | ORAL | Status: DC

## 2014-04-27 MED ORDER — INSULIN ASPART 100 UNIT/ML FLEXPEN: 1.0000 [IU] | PEN_INJECTOR | Freq: Three times a day (TID) | SUBCUTANEOUS | Status: DC

## 2014-04-27 MED ORDER — GLUCOSE BLOOD VI STRP: ORAL_STRIP | Status: DC

## 2014-04-27 NOTE — Progress Notes (Signed)
Pre visit review using our clinic review tool, if applicable. No additional management support is needed unless otherwise documented below in the visit note. 

## 2014-04-27 NOTE — Progress Notes (Signed)
CSW referred to assist patient with community resources. CSW met with patient and wife in the clinic. Patient reports he is on disability and receives Medicare and Medicaid. Patient's wife is not working at the moment so his income is supporting the family. Patient reports he has 5 children and only 2 reside with them and the others he pays child support. Patient reports difficulty making monthly expenses and "my check covers the rent and light bill". Patient also mentioned inability to reach anyone at [facility] therefore has been unable to get rides to and from his medical appointments. 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 CSW discussed options for budgeting monthly expenses and utilizing the food pantries so monies can be used for medication co-pays ($3 on medicaid). CSW also provided information on reduced bus  Transportation and encouraged patient to continue to reach Orthoptist for [employer] covered medical transport services. Patient and wife verbalize understanding of visit and options and will follow through with resources provided. CSW will continue to be available to patient and wife as needed. Raquel Sarna, [place]

## 2014-04-27 NOTE — Progress Notes (Signed)
Patient ID: Steven CarotaWayne O Dunnigan Jr., male   DOB: Oct 13, 1976, 38 y.o.   MRN: [id number]   PCP: Dr. Jonny RuizJohn  HPI: Steven Wyatt is a 38 yo male with a history of NICM (nor cors Oct 2014), chronic systolic HF, morbid obesity, OSA, HTN, CKD and HLD.   Admitted 1/13-1/18/15 for A/C HF. He was placed on IV lasix gtt and milrinone, however signed out AMA. During his admission his weight decreased from 405 to 368 lbs. ECHO showed EF 20% with diff HK, grade II DD, mod pulm HTN and mod sev LAE. His Cr bumped to 1.56. CO-OX when he left AMA was 35% on 01/08/14.   Readmitted 01/11/14 after he had syncopal episode.  Diuresed with lasix drip and metolazone. He was transitioned to bumex 4 mg twice a day. He was not be placed on Ace/Spiro due to CKD. He is not currently LVAD/transplant candidate due to obesity, noncompliance, RV dysfunction, and CKD. Discharged on home [facility] at [facility] m[facility]. Discharge weight 357 pounds.   Admitted 4/9-4/11/15 for perirectal abscess which he had I/D and was discharged. His Cr increased and was readmitted 4/20 for CP and volume overload after reduction in milrinone to 0.25. It was increased back to 0.375, however had 30 beat run VT and LifeVest was arranged. Weight on discharge was 348 lbs (04/15/14). Presented back to hospital 5/2 and was admitted again for CP. He had been out of his medications for 1 week. Diuresed and discharged 04/24/14 at weight of 343 lbs. Presented to ED 04/25/14 and gave him 40 meq of potassium and fentanly.   Follow up: "Feeling miserable." Reports having CP and SOB which gets worse with movement. CP described as pounding and sharpness that runs through his whole chest. SOB with talking and sitting. Weight at home 335-345 lbs. Milrinone 0.375 through PICC. Wearing BiPap nightly. + Dizziness with walking. +PND and orthopnea (2 pillows). Reports he is out of his insulin. Has not needed any metolazone. Has been out Novolog and lanuts for about 2 weeks. No signs of  bleeding.   Labs 01/15/14 K 3.3 Creatinine 1.75  Labs 01/16/14 K 3.5 Creatinine 1.65  Labs 3/15 K 2.9 => 3.7, creatinine 1.34 => 1.36, HCT 33.5 Labs 04/2014: K+ 3.5 and creatinine 1.46, Troponin 0.30, pro-BNP 1483, Hgb 8.6  ECG: sinus tachy at 114, LBBB with QRS 146 msec   FH: No CHF, no SCD that he knows of.  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 Lives with his wife and 2 sons, has 3 other kids.  [place]   ROS: All systems negative except as listed in HPI, PMH and Problem List.  PMH: 1. Nonischemic cardiomyopathy: Initial work[place] in [place].  LHC (10/14) with normal coronaries.  Echo (1/15) with EF 20%, mild diffuse hypokinesis, moderately dilated LV, moderately dilated RV with moderately decreased systolic function, PA systolic pressure 59 mmHg.  1/15 low output heart failure, milrinone begun.  2. Morbid obesity 3. OHS/OSA on Bipap.  4. HTN 5. Hyperlipidemia 6. CKD: Suspect cardiorenal syndrome 7. Syncope: Vasovagal in setting of vomiting 8. Type II diabetes: Borderline.   Current Outpatient Prescriptions  Medication Sig Dispense Refill  . albuterol (PROVENTIL HFA;VENTOLIN HFA) 108 (90 BASE) MCG/ACT inhaler Inhale 2 puffs into the lungs daily as needed for wheezing or shortness of breath.      Marland Kitchen. aspirin 500 MG tablet Take 100 mg by mouth every 6 (six) hours as needed for pain.      Marland Kitchen. atorvastatin (  LIPITOR) 80 MG tablet Take 80 mg by mouth daily.      . bumetanide (BUMEX) 2 MG tablet Take 2 tablets (4 mg total) by mouth 2 (two) times daily.  120 tablet  6  . carvedilol (COREG) 3.125 MG tablet Take 1 tablet (3.125 mg total) by mouth 2 (two) times daily.  60 tablet  3  . digoxin (LANOXIN) 0.125 MG tablet Take 1 tablet (0.125 mg total) by mouth daily.  30 tablet  6  . hydrALAZINE (APRESOLINE) 25 MG tablet Take 1 tablet (25 mg total) by mouth every 8 (eight) hours.  90 tablet  6  . isosorbide mononitrate (IMDUR) 30 MG 24 hr tablet Take 1 tablet (30 mg total) by mouth daily.   30 tablet  6  . magnesium oxide (MAG-OX) 400 MG tablet Take 1 tablet (400 mg total) by mouth 2 (two) times daily.  60 tablet  3  . milrinone (PRIMACOR) 20 MG/100ML SOLN infusion Inject 62.7375 mcg/min into the vein continuous.  100 mL  6  . potassium chloride SA (K-DUR,KLOR-CON) 20 MEQ tablet Take 2 tablets (40 mEq total) by mouth 2 (two) times daily.  120 tablet  3  . traMADol (ULTRAM) 50 MG tablet Take 2 tablets (100 mg total) by mouth every 6 (six) hours as needed for moderate pain.  30 tablet  0  . insulin aspart (NOVOLOG FLEXPEN) 100 UNIT/ML FlexPen Inject 1-15 Units into the skin 3 (three) times daily with meals. Sliding scale      . insulin glargine (LANTUS) 100 UNIT/ML injection Inject 0.25 mLs (25 Units total) into the skin at bedtime.  10 mL  11   No current facility-administered medications for this encounter.    Filed Vitals:   04/27/14 1028  BP: 120/82  Pulse: 99  Weight: 345 lb 12.8 oz (156.854 kg)  SpO2: 95%    PHYSICAL EXAM: General:  Chronically ill appearing. No resp difficulty; wife present HEENT: normal Neck: Thick. JVP difficult to assess d/t body habitus but appears 8; Carotids 2+ bilaterally; no bruits. No lymphadenopathy or thryomegaly appreciated. Cor: PMI nonpalpable. Regular rate & rhythm. No rubs, gallops or murmurs. Lungs: clear Abdomen: obese soft, nontender, nondistended.Good bowel sounds. Extremities: no cyanosis, clubbing, rash. LUE double lumen PICC. Chronic venous stasis changes but only trace ankle edema.  Neuro: alert & orientedx3, cranial nerves grossly intact. Moves all 4 extremities w/o difficulty. Affect pleasant.  ASSESSMENT & PLAN:  1. Chronic Systolic Heart Failure: Nonischemic cardiomyopathy. EF 35% (03/2014) diff HK, LA mildly dilated, RV sys fx normal.  - NYHA III symptoms and volume status stable. Will continue bumex 4 mg BID.  - Continue milrinone 0.375 last time we tried to wean he was admitted for cardiogenic shock. - Continue  current doses of cored, hydralazine/Imdur, and digoxin. Will not titrate anything with dizziness. - Will try to get patient in to see Dr. Graciela Husbands for CRT-D before May 27th when his current appointment is scheduled for. - Dr. Gala Romney discussed in depth with patient and wife about the process for LVAD, complications and benefits. He discussed that the patient is not in need of LVAD right now and currently does not meet criteria with EF and that we would like to proceed with CRT-D to see if this will help patient. He discussed the concerns of the patients ability to remain calm under stress. - Reinforced the need and importance of daily weights, a low sodium diet, and fluid restriction (less than 2 L a day). Instructed to  call the HF clinic if weight increases more than 3 lbs overnight or 5 lbs in a week.  2. OSA/OHS:  continue nightly BiPap.  3. CKD: Stable creatinine. Will repeat next visit 4. DM2: Patient reports being out of insulin for 2 weeks. We called and got him a work in appointment for this afternoon and will pay for a cab for him to arrive.   Had CSW meet with patient about finances. Patient has Medicaid and medications should be no more than 3$. She also discussed with him that he should be using the SC[facility] option through Medicaid to provide transportation to and from appointments. Will continue to follow.   F/U 2 weeks. Karie Mainland B CosgroveNP-C 04/27/2014  Patient seen and examined with Ulla Potash, NP. We discussed all aspects of the encounter. I agree with the assessment and plan as stated above. Overall stable from HF perspective on milrinone. Volume status looks ok. Extensive discussion with him and his wife about his situation regarding advanced therapies. Currently not transplant candidate due to his size and I am not sure he will be able to lose enough weight to get to the point where can consider (BMI 35 or less). Thus our options are to continue milrinone, consider VAD or try to temporize  him with CRT. I explained to them our [facility] process for VAD selection  nd also relayed to him that I did not think he was an acceptable candidate currently due to his social issues, outbursts and lack of consistency with meds and other instructions. We will continue to work on this. In the meanwhile he will see Dr. Graciela Husbands for consideration of CRT-D (with Optivol capability).   Total time spent 45 minutes. Over 3/4 of that time spent discussing above.   Bevelyn Buckles Norva Bowe,MD 6:34 PM

## 2014-04-27 NOTE — Progress Notes (Signed)
Subjective:    Patient ID: Steven Carota., male    DOB: 14-Jun-1976, 38 y.o.   MRN: [id number]  HPI    Here to establish as new pt  Overall doing ok;  Pt denies CP, worsening SOB, DOE, wheezing, orthopnea, PND, worsening LE edema, palpitations, dizziness or syncope.  Pt denies neurological change such as new headache, facial or extremity weakness.  Pt denies polydipsia, polyuria, or low sugar symptoms. Pt denies worsening depressive symptoms, suicidal ideation or panic. No fever, night sweats, wt loss, loss of appetite, or other constitutional symptoms.  Pt states fair ability with ADL's, low fall risk, home safety reviewed and adequate.  Overall wt down from 419 to 345 with diuresis recent.  Does have several wks ongoing nasal allergy symptoms with clearish congestion, itch and sneezing, without fever, pain, ST, cough, swelling or wheezing.  Denies worsening depressive symptoms, suicidal ideation, or panic; has ongoing anxiety. CBG's < 125 with good compliacne recently.  Last a1c 2 wks ago 13.7.  On bipap qhs (22/16) Past Medical History  Diagnosis Date  . Chronic systolic CHF (congestive heart failure)     a. on home milrinone - dose titrate to 0.376mcg/kg/min 03/2014.  . Diabetes   . HTN (hypertension)   . Morbid obesity with BMI of 50.0-59.9, adult   . Sleep apnea     on Bi Pap  . Dyslipidemia   . CKD (chronic kidney disease), stage III   . Asthma 01/03/2014  . DKA (diabetic ketoacidoses) 02/25/2014  . Hypokalemia 01/04/2014  . Hyponatremia 02/25/2014  . Microcytic anemia 01/04/2014  . 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 Normal coronary arteries- last cath Oct 2014 at [facility] 01/03/2014  . History of chicken pox   . Perirectal abscess 03/2014   Past Surgical History  Procedure Laterality Date  . Incision and drainage abscess N/A 03/31/2014    Procedure: INCISION AND DRAINAGE  PERI-RECTAL ABSCESS;  Surgeon: Axel Filler, MD;  Location: [facility] OR;  Service: General;  Laterality:  N/A;    reports that he has never smoked. He has never used smokeless tobacco. He reports that he does not drink alcohol or use illicit drugs. family history includes Diabetes in his maternal grandmother; Hypertension in his mother. Allergies  Allergen Reactions  . Iodine Anaphylaxis  . Shellfish Allergy Anaphylaxis   Current Outpatient Prescriptions on File Prior to Visit  Medication Sig Dispense Refill  . albuterol (PROVENTIL HFA;VENTOLIN HFA) 108 (90 BASE) MCG/ACT inhaler Inhale 2 puffs into the lungs daily as needed for wheezing or shortness of breath.      Marland Kitchen aspirin 500 MG tablet Take 100 mg by mouth every 6 (six) hours as needed for pain.      Marland Kitchen atorvastatin (LIPITOR) 80 MG tablet Take 80 mg by mouth daily.      . bumetanide (BUMEX) 2 MG tablet Take 2 tablets (4 mg total) by mouth 2 (two) times daily.  120 tablet  6  . carvedilol (COREG) 3.125 MG tablet Take 1 tablet (3.125 mg total) by mouth 2 (two) times daily.  60 tablet  3  . digoxin (LANOXIN) 0.125 MG tablet Take 1 tablet (0.125 mg total) by mouth daily.  30 tablet  6  . hydrALAZINE (APRESOLINE) 25 MG tablet Take 1 tablet (25 mg total) by mouth every 8 (eight) hours.  90 tablet  6  . isosorbide mononitrate (IMDUR) 30 MG 24 hr tablet Take 1 tablet (30 mg total) by mouth daily.  30 tablet  6  .  magnesium oxide (MAG-OX) 400 MG tablet Take 1 tablet (400 mg total) by mouth 2 (two) times daily.  60 tablet  3  . milrinone (PRIMACOR) 20 MG/100ML SOLN infusion Inject 62.7375 mcg/min into the vein continuous.  100 mL  6  . potassium chloride SA (K-DUR,KLOR-CON) 20 MEQ tablet Take 2 tablets (40 mEq total) by mouth 2 (two) times daily.  120 tablet  3  . traMADol (ULTRAM) 50 MG tablet Take 2 tablets (100 mg total) by mouth every 6 (six) hours as needed for moderate pain.  30 tablet  0   No current facility-administered medications on file prior to visit.    Review of Systems  Constitutional: Negative for unusual diaphoresis or other sweats    HENT: Negative for ringing in ear Eyes: Negative for double vision or worsening visual disturbance.  Respiratory: Negative for choking and stridor.   Gastrointestinal: Negative for vomiting or other signifcant bowel change Genitourinary: Negative for hematuria or decreased urine volume.  Musculoskeletal: Negative for other MSK pain or swelling Skin: Negative for color change and worsening wound.  Neurological: Negative for tremors and numbness other than noted  Psychiatric/Behavioral: Negative for decreased concentration or agitation other than above       Objective:   Physical Exam BP 98/78  Pulse 106  Temp(Src) 98.7 F (37.1 C) (Oral)  Ht 5\' 8"  (1.727 m)  Wt 345 lb 12 oz (156.831 kg)  BMI 52.58 kg/m2  SpO2 93% VS noted,  Constitutional: Pt appears well-developed, well-nourished.  HENT: Head: NCAT.  Right Ear: External ear normal.  Left Ear: External ear normal.  Eyes: . Pupils are equal, round, and reactive to light. Conjunctivae and EOM are normal Neck: Normal range of motion. Neck supple.  Cardiovascular: Normal rate and regular rhythm.   Pulmonary/Chest: Effort normal and breath sounds normal.  Abd:  Soft, NT, ND, + BS Neurological: Pt is alert. Not confused , motor grossly intact Skin: Skin is warm. No rash Psychiatric: Pt behavior is normal. No agitation. 1-2+ nervous    Assessment & Plan:

## 2014-04-27 NOTE — Patient Instructions (Signed)
Please take all new medication as prescribed  - the clonazepam as needed, and the claritin  Please continue all other medications as before, and refills have been done if requested. Please have the pharmacy call with any other refills you may need.  Please keep your appointments with your specialists as you have planned  Please return in 3 months, or sooner if needed

## 2014-04-29 ENCOUNTER — Encounter: Payer: Self-pay | Admitting: Internal Medicine

## 2014-04-29 ENCOUNTER — Inpatient Hospital Stay (HOSPITAL_COMMUNITY)
Admission: EM | Admit: 2014-04-29 | Discharge: 2014-05-01 | DRG: 309 | Disposition: A | Discharge status: Home Health | Payer: Medicare Other | Attending: Internal Medicine | Admitting: Internal Medicine

## 2014-04-29 ENCOUNTER — Emergency Department (HOSPITAL_COMMUNITY): Payer: Medicare Other

## 2014-04-29 DIAGNOSIS — I472 Ventricular tachycardia, unspecified: Secondary | ICD-10-CM

## 2014-04-29 DIAGNOSIS — I5043 Acute on chronic combined systolic (congestive) and diastolic (congestive) heart failure: Secondary | ICD-10-CM

## 2014-04-29 DIAGNOSIS — R079 Chest pain, unspecified: Secondary | ICD-10-CM

## 2014-04-29 DIAGNOSIS — J309 Allergic rhinitis, unspecified: Secondary | ICD-10-CM

## 2014-04-29 DIAGNOSIS — R0602 Shortness of breath: Secondary | ICD-10-CM

## 2014-04-29 DIAGNOSIS — I509 Heart failure, unspecified: Secondary | ICD-10-CM

## 2014-04-29 DIAGNOSIS — I5022 Chronic systolic (congestive) heart failure: Secondary | ICD-10-CM

## 2014-04-29 HISTORY — DX: Anxiety disorder, unspecified: F41.9

## 2014-04-29 HISTORY — DX: Allergic rhinitis, unspecified: J30.9

## 2014-04-29 LAB — CBC WITH DIFFERENTIAL/PLATELET
BASOS ABS: 0 10*3/uL (ref 0.0–0.1)
Basophils Relative: 0 % (ref 0–1)
EOS PCT: 2 % (ref 0–5)
Eosinophils Absolute: 0.1 10*3/uL (ref 0.0–0.7)
HEMATOCRIT: 28.4 % — AB (ref 39.0–52.0)
Hemoglobin: 8.9 g/dL — ABNORMAL LOW (ref 13.0–17.0)
LYMPHS ABS: 0.9 10*3/uL (ref 0.7–4.0)
Lymphocytes Relative: 18 % (ref 12–46)
MCH: 25.4 pg — ABNORMAL LOW (ref 26.0–34.0)
MCHC: 31.3 g/dL (ref 30.0–36.0)
MCV: 80.9 fL (ref 78.0–100.0)
MONO ABS: 0.2 10*3/uL (ref 0.1–1.0)
Monocytes Relative: 5 % (ref 3–12)
Neutro Abs: 3.6 10*3/uL (ref 1.7–7.7)
Neutrophils Relative %: 75 % (ref 43–77)
Platelets: 314 10*3/uL (ref 150–400)
RBC: 3.51 MIL/uL — ABNORMAL LOW (ref 4.22–5.81)
RDW: 19.2 % — AB (ref 11.5–15.5)
WBC: 4.9 10*3/uL (ref 4.0–10.5)

## 2014-04-29 LAB — I-STAT TROPONIN, ED: TROPONIN I, POC: 0.03 ng/mL (ref 0.00–0.08)

## 2014-04-29 MED ORDER — MORPHINE SULFATE 4 MG/ML IJ SOLN
4.0000 mg | Freq: Once | INTRAMUSCULAR | Status: AC
  Administered 2014-04-29: 4 mg via INTRAVENOUS
  Filled 2014-04-29: qty 1

## 2014-04-29 MED ORDER — ONDANSETRON HCL 4 MG/2ML IJ SOLN
4.0000 mg | Freq: Once | INTRAMUSCULAR | Status: AC
  Administered 2014-04-29: 4 mg via INTRAVENOUS
  Filled 2014-04-29: qty 2

## 2014-04-29 NOTE — Assessment & Plan Note (Signed)
stable overall by history and exam, recent data reviewed with pt, and pt to continue medical treatment as before,  to f/u any worsening symptoms or concerns BP Readings from Last 3 Encounters:  04/27/14 98/78  04/27/14 120/82  04/25/14 104/68

## 2014-04-29 NOTE — ED Provider Notes (Signed)
CSN: [id number]     Arrival date & time 04/29/14  2208 History   First MD Initiated Contact with Patient 04/29/14 2209     Chief Complaint  Patient presents with  . Chest Pain     (Consider location/radiation/quality/duration/timing/severity/associated sxs/prior Treatment) HPI Comments: Patient is a 38 year old male with history of chronic systolic CHF on home milrinone, diabetes, hypertension, sleep apnea on BiPap, dyslipidemia, CKD, asthma, normal coronary arteries with last cath 09/2013 who presents today with chest pain and shortness of breath. He reports that he has generally not been feeling well over the past few days. He was seen by Dr. Jones Broom on Thursday and told he was ok. No adjustments were made to his milrinone. Today, just prior to arrival he was sitting on his bed helping his son with his homework when he felt a pounding sensation in his heart. It felt as though his heart was racing and he used his home pulse oximeter to measure his heart rate. His heart rate was 202 at that time. This lasted approximately 15 minutes and resolved without intervention. He continues to have a sharp chest pain that radiates to his back and his left shoulder. This feels like the pain he has with his CHF exacerbations, but it has never been this severe in the past. He uses a BiPap at night. He used his BiPap today which did not seem to help his symptoms. He does not use any other home oxygen.   Patient is a 38 y.o. male presenting with chest pain. The history is provided by the patient. No language interpreter was used.  Chest Pain Associated symptoms: palpitations and shortness of breath   Associated symptoms: no abdominal pain, no diaphoresis, no fever, no nausea and not vomiting     Past Medical History  Diagnosis Date  . Chronic systolic CHF (congestive heart failure)     a. on home milrinone - dose titrate to 0.361mcg/kg/min 03/2014.  . Diabetes   . HTN (hypertension)   . Morbid obesity with  BMI of 50.0-59.9, adult   . Sleep apnea     on Bi Pap  . Dyslipidemia   . CKD (chronic kidney disease), stage III   . Asthma 01/03/2014  . DKA (diabetic ketoacidoses) 02/25/2014  . Hypokalemia 01/04/2014  . Hyponatremia 02/25/2014  . Microcytic anemia 01/04/2014  . 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 Normal coronary arteries- last cath Oct 2014 at [facility] 01/03/2014  . History of chicken pox   . Perirectal abscess 03/2014  . Allergic rhinitis, cause unspecified 04/29/2014   Past Surgical History  Procedure Laterality Date  . Incision and drainage abscess N/A 03/31/2014    Procedure: INCISION AND DRAINAGE  PERI-RECTAL ABSCESS;  Surgeon: Axel Filler, MD;  Location: [facility] OR;  Service: General;  Laterality: N/A;   Family History  Problem Relation Age of Onset  . Hypertension Mother   . Diabetes Maternal Grandmother    History  Substance Use Topics  . Smoking status: Never Smoker   . Smokeless tobacco: Never Used  . Alcohol Use: No    Review of Systems  Constitutional: Negative for fever, chills and diaphoresis.  Respiratory: Positive for shortness of breath.   Cardiovascular: Positive for chest pain and palpitations.  Gastrointestinal: Negative for nausea, vomiting and abdominal pain.  All other systems reviewed and are negative.     Allergies  Iodine and Shellfish allergy  Home Medications   Prior to Admission medications   Medication Sig Start Date End  Date Taking? Authorizing Provider  albuterol (PROVENTIL HFA;VENTOLIN HFA) 108 (90 BASE) MCG/ACT inhaler Inhale 2 puffs into the lungs daily as needed for wheezing or shortness of breath. 01/08/14   Wilburt FinlayBryan Hager, PA-C  aspirin 500 MG tablet Take 100 mg by mouth every 6 (six) hours as needed for pain.    Historical Provider, MD  atorvastatin (LIPITOR) 80 MG tablet Take 80 mg by mouth daily. 01/08/14   Wilburt FinlayBryan Hager, PA-C  bumetanide (BUMEX) 2 MG tablet Take 2 tablets (4 mg total) by mouth 2 (two) times daily. 01/16/14    Amy D Filbert Schilderlegg, NP  carvedilol (COREG) 3.125 MG tablet Take 1 tablet (3.125 mg total) by mouth 2 (two) times daily. 02/20/14   Dolores Pattyaniel R Bensimhon, MD  clonazePAM (KLONOPIN) 0.5 MG tablet Take 1 tablet (0.5 mg total) by mouth 2 (two) times daily as needed for anxiety. 04/27/14   Corwin LevinsJames W John, MD  digoxin (LANOXIN) 0.125 MG tablet Take 1 tablet (0.125 mg total) by mouth daily. 01/16/14   Amy D Clegg, NP  glucose blood (ACCU-CHEK AVIVA PLUS) test strip Use as directed twice daily to check blood sugar.  Diagnosis code 250.00 04/27/14   Corwin LevinsJames W John, MD  hydrALAZINE (APRESOLINE) 25 MG tablet Take 1 tablet (25 mg total) by mouth every 8 (eight) hours. 01/23/14   Amy D Clegg, NP  insulin aspart (NOVOLOG FLEXPEN) 100 UNIT/ML FlexPen Inject 1-15 Units into the skin 3 (three) times daily with meals. Sliding scale 04/27/14   Corwin LevinsJames W John, MD  insulin glargine (LANTUS) 100 UNIT/ML injection Inject 0.25 mLs (25 Units total) into the skin at bedtime. 04/27/14   Corwin LevinsJames W John, MD  isosorbide mononitrate (IMDUR) 30 MG 24 hr tablet Take 1 tablet (30 mg total) by mouth daily. 01/16/14   Amy D Filbert Schilderlegg, NP  loratadine (CLARITIN) 10 MG tablet Take 1 tablet (10 mg total) by mouth daily. 04/27/14   Corwin LevinsJames W John, MD  magnesium oxide (MAG-OX) 400 MG tablet Take 1 tablet (400 mg total) by mouth 2 (two) times daily. 03/07/14   Aundria RudAli B Cosgrove, NP  milrinone [facility](PRIMACOR) 20 MG/100ML SOLN infusion Inject 62.7375 mcg/min into the vein continuous. 04/15/14   Ok Anishristopher R Berge, NP  potassium chloride SA (K-DUR,KLOR-CON) 20 MEQ tablet Take 2 tablets (40 mEq total) by mouth 2 (two) times daily. 04/15/14   Ok Anishristopher R Berge, NP  traMADol (ULTRAM) 50 MG tablet Take 2 tablets (100 mg total) by mouth every 6 (six) hours as needed for moderate pain. 04/15/14   Ok Anishristopher R Berge, NP   BP 116/75  Temp(Src) 97.8 F (36.6 C) (Oral)  Resp 23  SpO2 92% Physical Exam  Nursing note and vitals reviewed. Constitutional: He is oriented to person, place, and time. He  appears well-developed and well-nourished. He appears distressed.  Morbidly obese  HENT:  Head: Normocephalic and atraumatic.  Right Ear: External ear normal.  Left Ear: External ear normal.  Nose: Nose normal.  Eyes: Conjunctivae are normal.  Neck: Normal range of motion. No tracheal deviation present.  Cardiovascular: Normal rate, regular rhythm and normal heart sounds.   Pulmonary/Chest: No stridor. Tachypnea noted. He has wheezes. He has rhonchi.  Abdominal: Soft. He exhibits no distension. There is no tenderness.  Musculoskeletal: Normal range of motion.  Neurological: He is alert and oriented to person, place, and time.  Skin: Skin is warm and dry. He is not diaphoretic.  Psychiatric: He has a normal mood and affect. His behavior is normal.  ED Course  Procedures (including critical care time) Labs Review Labs Reviewed  CBC WITH DIFFERENTIAL - Abnormal; Notable for the following:    RBC 3.51 (*)    Hemoglobin 8.9 (*)    HCT 28.4 (*)    MCH 25.4 (*)    RDW 19.2 (*)    All other components within normal limits  COMPREHENSIVE METABOLIC PANEL - Abnormal; Notable for the following:    Sodium 133 (*)    Potassium 3.1 (*)    Chloride 91 (*)    Albumin 3.3 (*)    Total Bilirubin 2.0 (*)    GFR calc non Af Amer 77 (*)    GFR calc Af Amer 89 (*)    All other components within normal limits  PRO B NATRIURETIC PEPTIDE - Abnormal; Notable for the following:    Pro B Natriuretic peptide (BNP) 960.7 (*)    All other components within normal limits  DIGOXIN LEVEL - Abnormal; Notable for the following:    Digoxin Level 0.7 (*)    All other components within normal limits  CBC - Abnormal; Notable for the following:    RBC 3.56 (*)    Hemoglobin 9.1 (*)    HCT 28.9 (*)    MCH 25.6 (*)    RDW 19.3 (*)    All other components within normal limits  CREATININE, SERUM - Abnormal; Notable for the following:    GFR calc non Af Amer 72 (*)    GFR calc Af Amer 83 (*)    All other  components within normal limits  GLUCOSE, CAPILLARY - Abnormal; Notable for the following:    Glucose-Capillary 130 (*)    All other components within normal limits  GLUCOSE, CAPILLARY - Abnormal; Notable for the following:    Glucose-Capillary 123 (*)    All other components within normal limits  GLUCOSE, CAPILLARY - Abnormal; Notable for the following:    Glucose-Capillary 143 (*)    All other components within normal limits  BASIC METABOLIC PANEL - Abnormal; Notable for the following:    Chloride 95 (*)    Glucose, Bld 102 (*)    Creatinine, Ser 1.43 (*)    GFR calc non Af Amer 62 (*)    GFR calc Af Amer 72 (*)    All other components within normal limits  GLUCOSE, CAPILLARY - Abnormal; Notable for the following:    Glucose-Capillary 100 (*)    All other components within normal limits  TROPONIN I  GLUCOSE, CAPILLARY  I-STAT TROPOININ, ED    Imaging Review Dg Chest Port 1 View  04/29/2014   CLINICAL DATA:  Shortness of breath  EXAM: PORTABLE CHEST - 1 VIEW  COMPARISON:  04/25/2014  FINDINGS: Unchanged cardiopericardial enlargement. Unremarkable upper mediastinal contours. Cephalized pulmonary blood flow. No consolidation, effusion, or pneumothorax. Stable positioning of right upper extremity PICC, tip at the upper SVC.  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   Electronically Signed   By: Tiburcio Pea M.D.   On: 04/29/2014 23:17     EKG Interpretation   Date/Time:  Saturday Apr 29 2014 22:09:40 EDT Ventricular Rate:  106 PR Interval:  190 QRS Duration: 141 QT Interval:  374 QTC Calculation: 497 R Axis:   -56 Text Interpretation:  Sinus tachycardia Ventricular bigeminy Probable left  atrial enlargement Left bundle branch block Similar to prior Confirmed by  Gwendolyn Grant  MD, BLAIR ([id number]) on 04/29/2014 10:16:07 PM      MDM   Final diagnoses:  Chest pain  Shortness of breath  CHF (congestive heart failure)    Patient presents to ED  with chest pain and shortness of breath. Patient with CHF on milrinone. States his heart rate was up to 202/203. Increased oxygen demand. Patient seen by cardiology who will admit for further evaluation. Patient is hemodynamically stable. Dr. Gwendolyn Grant evaluated patient and agrees with plan. Patient / Family / Caregiver informed of clinical course, understand medical decision-making process, and agree with plan.     Mora Bellman, PA-C 05/01/14 1158

## 2014-04-29 NOTE — Assessment & Plan Note (Signed)
For claritin otc prn,  to f/u any worsening symptoms or concerns

## 2014-04-29 NOTE — Assessment & Plan Note (Addendum)
Severe uncontroleld recent, now more stable overall by history and exam, recent data reviewed with pt, and pt to continue medical treatment,  to f/u any worsening symptoms or concerns Lab Results  Component Value Date   HGBA1C 13.7* 04/13/2014   F/u with endo as planned

## 2014-04-29 NOTE — ED Notes (Signed)
Pt to ED with c/o central chest pain, onset after being discharged on 5/5.  Pt also c/o shortness of breath.  Pt was seen by his MD on Thur for same.  Pt refused NTG by EMS. Pt took ASA 324mg  PTA

## 2014-04-29 NOTE — Assessment & Plan Note (Signed)
For klonopin prn asd,  to f/u any worsening symptoms or concerns

## 2014-04-30 ENCOUNTER — Encounter (HOSPITAL_COMMUNITY): Payer: Self-pay | Admitting: Emergency Medicine

## 2014-04-30 DIAGNOSIS — R000 Tachycardia, unspecified: Secondary | ICD-10-CM | POA: Diagnosis present

## 2014-04-30 DIAGNOSIS — Z7982 Long term (current) use of aspirin: Secondary | ICD-10-CM | POA: Diagnosis not present

## 2014-04-30 DIAGNOSIS — I472 Ventricular tachycardia: Secondary | ICD-10-CM

## 2014-04-30 DIAGNOSIS — R002 Palpitations: Secondary | ICD-10-CM | POA: Diagnosis present

## 2014-04-30 DIAGNOSIS — I428 Other cardiomyopathies: Secondary | ICD-10-CM | POA: Diagnosis present

## 2014-04-30 DIAGNOSIS — N183 Chronic kidney disease, stage 3 unspecified: Secondary | ICD-10-CM | POA: Diagnosis present

## 2014-04-30 DIAGNOSIS — Z79899 Other long term (current) drug therapy: Secondary | ICD-10-CM | POA: Diagnosis not present

## 2014-04-30 DIAGNOSIS — J45909 Unspecified asthma, uncomplicated: Secondary | ICD-10-CM | POA: Diagnosis present

## 2014-04-30 DIAGNOSIS — R0602 Shortness of breath: Secondary | ICD-10-CM | POA: Diagnosis present

## 2014-04-30 DIAGNOSIS — I5043 Acute on chronic combined systolic (congestive) and diastolic (congestive) heart failure: Secondary | ICD-10-CM

## 2014-04-30 DIAGNOSIS — Z833 Family history of diabetes mellitus: Secondary | ICD-10-CM | POA: Diagnosis not present

## 2014-04-30 DIAGNOSIS — I5022 Chronic systolic (congestive) heart failure: Secondary | ICD-10-CM | POA: Diagnosis present

## 2014-04-30 DIAGNOSIS — I447 Left bundle-branch block, unspecified: Secondary | ICD-10-CM | POA: Diagnosis present

## 2014-04-30 DIAGNOSIS — R079 Chest pain, unspecified: Secondary | ICD-10-CM

## 2014-04-30 DIAGNOSIS — I4729 Other ventricular tachycardia: Secondary | ICD-10-CM

## 2014-04-30 DIAGNOSIS — I509 Heart failure, unspecified: Secondary | ICD-10-CM

## 2014-04-30 DIAGNOSIS — IMO0001 Reserved for inherently not codable concepts without codable children: Secondary | ICD-10-CM | POA: Diagnosis present

## 2014-04-30 DIAGNOSIS — G4733 Obstructive sleep apnea (adult) (pediatric): Secondary | ICD-10-CM | POA: Diagnosis present

## 2014-04-30 DIAGNOSIS — E785 Hyperlipidemia, unspecified: Secondary | ICD-10-CM | POA: Diagnosis present

## 2014-04-30 DIAGNOSIS — Z91199 Patient's noncompliance with other medical treatment and regimen due to unspecified reason: Secondary | ICD-10-CM | POA: Diagnosis not present

## 2014-04-30 DIAGNOSIS — Z794 Long term (current) use of insulin: Secondary | ICD-10-CM | POA: Diagnosis not present

## 2014-04-30 DIAGNOSIS — Z6841 Body Mass Index (BMI) 40.0 and over, adult: Secondary | ICD-10-CM | POA: Diagnosis not present

## 2014-04-30 DIAGNOSIS — I129 Hypertensive chronic kidney disease with stage 1 through stage 4 chronic kidney disease, or unspecified chronic kidney disease: Secondary | ICD-10-CM | POA: Diagnosis present

## 2014-04-30 LAB — COMPREHENSIVE METABOLIC PANEL
ALT: 14 U/L (ref 0–53)
AST: 20 U/L (ref 0–37)
Albumin: 3.3 g/dL — ABNORMAL LOW (ref 3.5–5.2)
Alkaline Phosphatase: 70 U/L (ref 39–117)
BUN: 16 mg/dL (ref 6–23)
CALCIUM: 8.8 mg/dL (ref 8.4–10.5)
CO2: 27 meq/L (ref 19–32)
CREATININE: 1.2 mg/dL (ref 0.50–1.35)
Chloride: 91 mEq/L — ABNORMAL LOW (ref 96–112)
GFR, EST AFRICAN AMERICAN: 89 mL/min — AB (ref >90)
GFR, EST NON AFRICAN AMERICAN: 77 mL/min — AB (ref >90)
GLUCOSE: 95 mg/dL (ref 70–99)
Potassium: 3.1 mEq/L — ABNORMAL LOW (ref 3.7–5.3)
Sodium: 133 mEq/L — ABNORMAL LOW (ref 137–147)
TOTAL PROTEIN: 7.8 g/dL (ref 6.0–8.3)
Total Bilirubin: 2 mg/dL — ABNORMAL HIGH (ref 0.3–1.2)

## 2014-04-30 LAB — CBC
HCT: 28.9 % — ABNORMAL LOW (ref 39.0–52.0)
HEMOGLOBIN: 9.1 g/dL — AB (ref 13.0–17.0)
MCH: 25.6 pg — AB (ref 26.0–34.0)
MCHC: 31.5 g/dL (ref 30.0–36.0)
MCV: 81.2 fL (ref 78.0–100.0)
Platelets: 334 10*3/uL (ref 150–400)
RBC: 3.56 MIL/uL — ABNORMAL LOW (ref 4.22–5.81)
RDW: 19.3 % — ABNORMAL HIGH (ref 11.5–15.5)
WBC: 5.1 10*3/uL (ref 4.0–10.5)

## 2014-04-30 LAB — GLUCOSE, CAPILLARY
Glucose-Capillary: 130 mg/dL — ABNORMAL HIGH (ref 70–99)
Glucose-Capillary: 123 mg/dL — ABNORMAL HIGH (ref 70–99)
Glucose-Capillary: 143 mg/dL — ABNORMAL HIGH (ref 70–99)

## 2014-04-30 LAB — DIGOXIN LEVEL: DIGOXIN LVL: 0.7 ng/mL — AB (ref 0.8–2.0)

## 2014-04-30 LAB — CREATININE, SERUM
Creatinine, Ser: 1.27 mg/dL (ref 0.50–1.35)
GFR calc Af Amer: 83 mL/min — ABNORMAL LOW (ref >90)
GFR, EST NON AFRICAN AMERICAN: 72 mL/min — AB (ref >90)

## 2014-04-30 LAB — PRO B NATRIURETIC PEPTIDE: Pro B Natriuretic peptide (BNP): 960.7 pg/mL — ABNORMAL HIGH (ref 0–125)

## 2014-04-30 LAB — TROPONIN I: Troponin I: <0.3 ng/mL (ref <0.30)

## 2014-04-30 MED ORDER — TRAMADOL HCL 50 MG PO TABS
100.0000 mg | ORAL_TABLET | Freq: Four times a day (QID) | ORAL | Status: DC | PRN
  Administered 2014-04-30 – 2014-05-01 (×3): 100 mg via ORAL
  Filled 2014-04-30 (×3): qty 2

## 2014-04-30 MED ORDER — MAGNESIUM OXIDE 400 (241.3 MG) MG PO TABS
400.0000 mg | ORAL_TABLET | Freq: Two times a day (BID) | ORAL | Status: DC
  Administered 2014-04-30 – 2014-05-01 (×4): 400 mg via ORAL
  Filled 2014-04-30 (×5): qty 1

## 2014-04-30 MED ORDER — MILRINONE IN DEXTROSE 20 MG/100ML IV SOLN
0.3750 ug/kg/min | INTRAVENOUS | Status: DC
  Administered 2014-04-30 – 2014-05-01 (×8): 0.375 ug/kg/min via INTRAVENOUS
  Filled 2014-04-30 (×8): qty 100

## 2014-04-30 MED ORDER — SODIUM CHLORIDE 0.9 % IJ SOLN
3.0000 mL | Freq: Two times a day (BID) | INTRAMUSCULAR | Status: DC
  Administered 2014-04-30 – 2014-05-01 (×3): 3 mL via INTRAVENOUS

## 2014-04-30 MED ORDER — POTASSIUM CHLORIDE ER 10 MEQ PO TBCR
40.0000 meq | EXTENDED_RELEASE_TABLET | Freq: Once | ORAL | Status: AC
  Administered 2014-04-30: 40 meq via ORAL
  Filled 2014-04-30: qty 4

## 2014-04-30 MED ORDER — BUMETANIDE 2 MG PO TABS
4.0000 mg | ORAL_TABLET | Freq: Two times a day (BID) | ORAL | Status: DC
  Administered 2014-04-30 – 2014-05-01 (×4): 4 mg via ORAL
  Filled 2014-04-30 (×5): qty 2

## 2014-04-30 MED ORDER — FENTANYL CITRATE 0.05 MG/ML IJ SOLN
50.0000 ug | Freq: Once | INTRAMUSCULAR | Status: AC
  Administered 2014-04-30: 50 ug via INTRAVENOUS
  Filled 2014-04-30: qty 2

## 2014-04-30 MED ORDER — LORATADINE 10 MG PO TABS
10.0000 mg | ORAL_TABLET | Freq: Every day | ORAL | Status: DC
Start: 2014-04-30 — End: 2014-05-01
  Administered 2014-04-30 – 2014-05-01 (×2): 10 mg via ORAL
  Filled 2014-04-30 (×2): qty 1

## 2014-04-30 MED ORDER — INSULIN GLARGINE 100 UNIT/ML ~~LOC~~ SOLN
25.0000 [IU] | Freq: Every day | SUBCUTANEOUS | Status: DC
  Administered 2014-04-30: 25 [IU] via SUBCUTANEOUS
  Filled 2014-04-30 (×2): qty 0.25

## 2014-04-30 MED ORDER — SODIUM CHLORIDE 0.9 % IJ SOLN
3.0000 mL | INTRAMUSCULAR | Status: DC | PRN
Start: 2014-04-30 — End: 2014-05-01

## 2014-04-30 MED ORDER — ALBUTEROL SULFATE (2.5 MG/3ML) 0.083% IN NEBU: 2.5000 mg | INHALATION_SOLUTION | Freq: Every day | RESPIRATORY_TRACT | Status: DC | PRN

## 2014-04-30 MED ORDER — CARVEDILOL 3.125 MG PO TABS
3.1250 mg | ORAL_TABLET | Freq: Two times a day (BID) | ORAL | Status: DC
  Administered 2014-04-30 – 2014-05-01 (×4): 3.125 mg via ORAL
  Filled 2014-04-30 (×5): qty 1

## 2014-04-30 MED ORDER — SODIUM CHLORIDE 0.9 % IV SOLN
250.0000 mL | INTRAVENOUS | Status: DC | PRN
Start: 2014-04-30 — End: 2014-05-01

## 2014-04-30 MED ORDER — DIGOXIN 125 MCG PO TABS
0.1250 mg | ORAL_TABLET | Freq: Every day | ORAL | Status: DC
  Administered 2014-04-30 – 2014-05-01 (×2): 0.125 mg via ORAL
  Filled 2014-04-30 (×2): qty 1

## 2014-04-30 MED ORDER — ATORVASTATIN CALCIUM 80 MG PO TABS
80.0000 mg | ORAL_TABLET | Freq: Every day | ORAL | Status: DC
  Administered 2014-04-30 – 2014-05-01 (×2): 80 mg via ORAL
  Filled 2014-04-30 (×2): qty 1

## 2014-04-30 MED ORDER — ALBUTEROL SULFATE HFA 108 (90 BASE) MCG/ACT IN AERS: 2.0000 | INHALATION_SPRAY | Freq: Every day | RESPIRATORY_TRACT | Status: DC | PRN

## 2014-04-30 MED ORDER — ASPIRIN EC 81 MG PO TBEC
81.0000 mg | DELAYED_RELEASE_TABLET | Freq: Every day | ORAL | Status: DC
  Administered 2014-04-30 – 2014-05-01 (×2): 81 mg via ORAL
  Filled 2014-04-30 (×2): qty 1

## 2014-04-30 MED ORDER — SODIUM CHLORIDE 0.9 % IJ SOLN
10.0000 mL | INTRAMUSCULAR | Status: DC | PRN
  Administered 2014-04-30 – 2014-05-01 (×2): 10 mL

## 2014-04-30 MED ORDER — CLONAZEPAM 0.5 MG PO TABS: 0.5000 mg | ORAL_TABLET | Freq: Two times a day (BID) | ORAL | Status: DC | PRN

## 2014-04-30 MED ORDER — HEPARIN SODIUM (PORCINE) 5000 UNIT/ML IJ SOLN
5000.0000 [IU] | Freq: Three times a day (TID) | INTRAMUSCULAR | Status: DC
  Filled 2014-04-30 (×6): qty 1

## 2014-04-30 MED ORDER — ISOSORBIDE MONONITRATE ER 30 MG PO TB24
30.0000 mg | ORAL_TABLET | Freq: Every day | ORAL | Status: DC
  Administered 2014-04-30 – 2014-05-01 (×2): 30 mg via ORAL
  Filled 2014-04-30 (×2): qty 1

## 2014-04-30 MED ORDER — HYDRALAZINE HCL 25 MG PO TABS
25.0000 mg | ORAL_TABLET | Freq: Three times a day (TID) | ORAL | Status: DC
  Administered 2014-04-30 – 2014-05-01 (×5): 25 mg via ORAL
  Filled 2014-04-30 (×7): qty 1

## 2014-04-30 MED ORDER — INSULIN ASPART 100 UNIT/ML ~~LOC~~ SOLN: 1.0000 [IU] | Freq: Three times a day (TID) | SUBCUTANEOUS | Status: DC

## 2014-04-30 MED ORDER — POTASSIUM CHLORIDE CRYS ER 20 MEQ PO TBCR
40.0000 meq | EXTENDED_RELEASE_TABLET | Freq: Two times a day (BID) | ORAL | Status: DC
  Administered 2014-04-30 (×2): 40 meq via ORAL
  Filled 2014-04-30 (×5): qty 2

## 2014-04-30 NOTE — H&P (Addendum)
Patient ID: Steven Wyatt. MRN: [id number], DOB/AGE: 1976/11/14   Admit date: 04/29/2014   Primary Physician: Oliver Barre, MD Primary Cardiologist: Dr. Arvilla Meres  Pt. Profile:  Steven Wyatt is a 38 yo male with a history of NICM (nor cors Oct 2014), chronic systolic HF on home milrinone, morbid obesity, OSA, HTN, CKD and HLD admitted with chest pain and reported tachycardia to 203bpm.  Problem List  Past Medical History  Diagnosis Date  . Chronic systolic CHF (congestive heart failure)     a. on home milrinone - dose titrate to 0.381mcg/kg/min 03/2014.  . Diabetes   . HTN (hypertension)   . Morbid obesity with BMI of 50.0-59.9, adult   . Sleep apnea     on Bi Pap  . Dyslipidemia   . CKD (chronic kidney disease), stage III   . Asthma 01/03/2014  . DKA (diabetic ketoacidoses) 02/25/2014  . Hypokalemia 01/04/2014  . Hyponatremia 02/25/2014  . Microcytic anemia 01/04/2014  . 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 Normal coronary arteries- last cath Oct 2014 at [facility] 01/03/2014  . History of chicken pox   . Perirectal abscess 03/2014  . Allergic rhinitis, cause unspecified 04/29/2014  . Shortness of breath   . Anxiety     Past Surgical History  Procedure Laterality Date  . Incision and drainage abscess N/A 03/31/2014    Procedure: INCISION AND DRAINAGE  PERI-RECTAL ABSCESS;  Surgeon: Axel Filler, MD;  Location: [facility] OR;  Service: General;  Laterality: N/A;     Allergies  Allergies  Allergen Reactions  . Iodine Anaphylaxis  . Shellfish Allergy Anaphylaxis    HPI Steven Wyatt is a 38 yo male with a history of NICM (nor cors Oct 2014), chronic systolic HF on home milrinone, morbid obesity, OSA, HTN, CKD and HLD admitted with chest pain and reported tachycardia to 203bpm.  Steven Wyatt was feeling very good today until he developed acute onset SOB and chest pain while helping his son with homework. She reported the CP is SS with sharp radiation to the left shoulder,  which is similar to multiple prior episodes. Today however, he states that the pain was more severe that usual - 8/10. At the time of the episode, he checked his pulse with the pulse oximeter and reported it was 93bpm. 10 minutes later he rechecked and his pulse was 203bpm. No palpitations, syncope, or pre-syncope.   Of note, he had been wearing a LifeVest with plans for a CRT-D implant. He states that he stopped wearing the LifeVest earlier in the week because it repeatedly beeped indicating that a shock was advised but he felt fine. He reports meticulous adherence to low salt diet and that he faithfully takes all medications.   In the ED he was given IV fentanyl with improvement in CP to 5/10 in severity. On my history his breathing was back to normal. BNP in ED was 960, down from 1483 on 5/5.   Home Medications  Prior to Admission medications   Medication Sig Start Date End Date Taking? Authorizing Provider  albuterol (PROVENTIL HFA;VENTOLIN HFA) 108 (90 BASE) MCG/ACT inhaler Inhale 2 puffs into the lungs daily as needed for wheezing or shortness of breath. 01/08/14  Yes Wilburt Finlay, PA-C  aspirin EC 81 MG tablet Take 81 mg by mouth daily.   Yes Historical Provider, MD  atorvastatin (LIPITOR) 80 MG tablet Take 80 mg by mouth daily. 01/08/14  Yes Wilburt Finlay, PA-C  bumetanide (BUMEX) 2 MG tablet Take  2 tablets (4 mg total) by mouth 2 (two) times daily. 01/16/14  Yes Amy D Clegg, NP  carvedilol (COREG) 3.125 MG tablet Take 1 tablet (3.125 mg total) by mouth 2 (two) times daily. 02/20/14  Yes Dolores Patty, MD  clonazePAM (KLONOPIN) 0.5 MG tablet Take 1 tablet (0.5 mg total) by mouth 2 (two) times daily as needed for anxiety. 04/27/14  Yes Corwin Levins, MD  digoxin (LANOXIN) 0.125 MG tablet Take 1 tablet (0.125 mg total) by mouth daily. 01/16/14  Yes Amy D Clegg, NP  hydrALAZINE (APRESOLINE) 25 MG tablet Take 1 tablet (25 mg total) by mouth every 8 (eight) hours. 01/23/14  Yes Amy D Clegg, NP  insulin  aspart (NOVOLOG FLEXPEN) 100 UNIT/ML FlexPen Inject 1-15 Units into the skin 3 (three) times daily with meals. Sliding scale 04/27/14  Yes Corwin Levins, MD  insulin glargine (LANTUS) 100 UNIT/ML injection Inject 0.25 mLs (25 Units total) into the skin at bedtime. 04/27/14  Yes Corwin Levins, MD  isosorbide mononitrate (IMDUR) 30 MG 24 hr tablet Take 1 tablet (30 mg total) by mouth daily. 01/16/14  Yes Amy D Clegg, NP  loratadine (CLARITIN) 10 MG tablet Take 1 tablet (10 mg total) by mouth daily. 04/27/14  Yes Corwin Levins, MD  magnesium oxide (MAG-OX) 400 MG tablet Take 1 tablet (400 mg total) by mouth 2 (two) times daily. 03/07/14  Yes Aundria Rud, NP  milrinone [facility]) 20 MG/100ML SOLN infusion Inject 62.7375 mcg/min into the vein continuous. 04/15/14  Yes Ok Anis, NP  potassium chloride SA (K-DUR,KLOR-CON) 20 MEQ tablet Take 2 tablets (40 mEq total) by mouth 2 (two) times daily. 04/15/14  Yes Ok Anis, NP  traMADol (ULTRAM) 50 MG tablet Take 2 tablets (100 mg total) by mouth every 6 (six) hours as needed for moderate pain. 04/15/14  Yes Ok Anis, NP  glucose blood (ACCU-CHEK AVIVA PLUS) test strip Use as directed twice daily to check blood sugar.  Diagnosis code 250.00 04/27/14   Corwin Levins, MD    Family History  Family History  Problem Relation Age of Onset  . Hypertension Mother   . Diabetes Maternal Grandmother     Social History  History   Social History  . Marital Status: Married    Spouse Name: N/A    Number of Children: N/A  . Years of Education: N/A   Occupational History  . Clinical Social Worker    Social History Main Topics  . Smoking status: Never Smoker   . Smokeless tobacco: Never Used  . Alcohol Use: No  . Drug Use: No  . Sexual Activity: Yes   Other Topics Concern  . Not on file   Social History Narrative   Lives with wife and 2/5 children.     Review of Systems General:  No chills, fever, night sweats or weight changes.    Cardiovascular:  (+) chest pain, + dyspnea , minimal edema (stable). No syncope or presyncope.  Dermatological: No rash, lesions/masses Respiratory: No cough, dyspnea Urologic: No hematuria, dysuria Abdominal:   No nausea, vomiting, diarrhea, bright red blood per rectum, melena, or hematemesis Neurologic:  No visual changes, wkns, changes in mental status. All other systems reviewed and are otherwise negative except as noted above.  Physical Exam  Blood pressure 96/66, pulse 100, temperature 97.6 F (36.4 C), temperature source Oral, resp. rate 24, SpO2 98.00%.  General: Pleasant, NAD Psych: Normal affect. Neuro: Alert and oriented X 3.  Moves all extremities spontaneously. HEENT: Normal  Neck: Supple without bruits or JVD. Lungs:  Resp regular and unlabored, CTA. Heart: RRR no s3, s4. Soft systolic murmur.  Abdomen: Soft, non-tender, non-distended, Extremities: No clubbing, cyanosis. Woody skin changes c/w longstanding edema. Trace LE edmea.   Labs  Troponin [facility](Point of Care Test)  Recent Labs  04/29/14 2309  TROPIPOC 0.03    Recent Labs  04/29/14 2305  TROPONINI <0.30   Lab Results  Component Value Date   WBC 4.9 04/29/2014   HGB 8.9* 04/29/2014   HCT 28.4* 04/29/2014   MCV 80.9 04/29/2014   PLT 314 04/29/2014    Recent Labs Lab 04/29/14 2305  NA 133*  K 3.1*  CL 91*  CO2 27  BUN 16  CREATININE 1.20  CALCIUM 8.8  PROT 7.8  BILITOT 2.0*  ALKPHOS 70  ALT 14  AST 20  GLUCOSE 95   No results found for this basename: CHOL, HDL, LDLCALC, TRIG   Lab Results  Component Value Date   DDIMER 1.85* 04/10/2014     Radiology/Studies  Dg Chest 2 View  04/22/2014   CLINICAL DATA:  Short of breath.  Chest pain.  EXAM: CHEST  2 VIEW  COMPARISON:  03/2014  FINDINGS: Cardiac silhouette is mildly enlarged. Normal mediastinal and hilar contours.  Hazy airspace opacity is seen, most prominent centrally. No pleural effusion. No pneumothorax.  Bony thorax is intact. Right PICC has  its tip in the mid superior vena cava.  IMPRESSION: Hazy central lung opacities suggest mild pulmonary edema, but could reflect bilateral infectious or inflammatory infiltrates. Appearance is similar to the most recent study.   Electronically Signed   By: Amie Portlandavid  Ormond M.D.   On: 04/22/2014 13:41   Dg Chest 2 View  04/10/2014   CLINICAL DATA:  Left-sided chest pain and shortness of breath for 3 days.  EXAM: CHEST  2 VIEW  COMPARISON:  02/24/2014  FINDINGS: Cardiac enlargement with pulmonary vascular congestion. Vascular congestion is increasing and there is interval development of interstitial edema and small bilateral pleural effusions since the previous study. Right PICC line remains unchanged in position.  IMPRESSION: Cardiac enlargement with increasing pulmonary vascular congestion and developing edema.   Electronically Signed   By: Burman NievesWilliam  Stevens M.D.   On: 04/10/2014 21:58   Dg Chest Port 1 View  04/29/2014   CLINICAL DATA:  Shortness of breath  EXAM: PORTABLE CHEST - 1 VIEW  COMPARISON:  04/25/2014  FINDINGS: Unchanged cardiopericardial enlargement. Unremarkable upper mediastinal contours. Cephalized pulmonary blood flow. No consolidation, effusion, or pneumothorax. Stable positioning of right upper extremity PICC, tip at the upper SVC.  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   Electronically Signed   By: Tiburcio PeaJonathan  Watts M.D.   On: 04/29/2014 23:17   Dg Chest Port 1 View  04/25/2014   CLINICAL DATA:  Shortness of breath with chest pain.  EXAM: PORTABLE CHEST - 1 VIEW  COMPARISON:  04/22/2014.  FINDINGS: Moderate enlargement cardiac silhouette, likely cardiomegaly. Worsening bilateral pulmonary opacities, likely pulmonary edema. No effusion or pneumothorax. PICC line tip remains at the proximal SVC. Increased body habitus.  IMPRESSION: Worsening aeration. Developing pulmonary edema. Moderate cardiac silhouette enlargement.   Electronically Signed   By: Davonna BellingJohn   Curnes M.D.   On: 04/25/2014 14:30      ECG NSR, LBBB, frequent PVCs   ASSESSMENT AND PLAN [name] is a 38 yo male with a history of NICM (nor cors Oct 2014), chronic systolic HF  on home milrinone, morbid obesity, OSA, HTN, CKD and HLD admitted with chest pain and reported tachycardia to 203bpm. Currently appears euvolemic. Reported tachycardia (and multiple LifeVest alarms) concerning for VT. Other considerations include rapid SVT including AF with RVR.   Chest pain is a recurrent issue. In setting of normal coronary arteries and euvolemic appearance, will not pursue further work-up at this time.   Plan 1. Admit to HF service 2. Monitor on telemetry 3. Check dig level 4. Continue home HF regimen 5. Would consider implanting CRT-D while patient is admitted given history of arrhythmias. 6. Replete K 7. Would contact LifeVest company which should be able to provide [place] with LifeVest interrogation results  Signed, Glori Luis, MD  04/30/2014, 3:00 AM

## 2014-04-30 NOTE — Plan of Care (Signed)
Problem: Phase I Progression Outcomes Goal: Voiding-avoid urinary catheter unless indicated Outcome: Adequate for Discharge Voiding 300-400 cc of urine per void.

## 2014-04-30 NOTE — Progress Notes (Signed)
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  He denies having any worsening chest pain or shortness of breath. He's here only because the life vest company told him that he should go to the hospital since his heart rate was 203.  Here he has not had any arrhythmias. He's a normal sinus rhythm with frequent premature ventricular contractions. Objective:    Vital Signs:   Temp:  [97.6 F (36.4 C)-98.8 F (37.1 C)] 98.8 F (37.1 C) (05/10 0448) Pulse Rate:  [39-107] 103 (05/10 0448) Resp:  [16-31] 22 (05/10 0448) BP: (96-134)/(55-89) 102/56 mmHg (05/10 0448) SpO2:  [90 %-98 %] 98 % (05/10 0448) Weight:  [347 lb 3.2 oz (157.489 kg)] 347 lb 3.2 oz (157.489 kg) (05/10 0300)  Last BM Date: 04/29/14   24-hour weight change: Weight change:   Weight trends: Filed Weights   04/30/14 0300  Weight: 347 lb 3.2 oz (157.489 kg)    Intake/Output:  05/09 0701 - 05/10 0700 In: 172.5 [P.O.:120; I.V.:52.5] Out: 450 [Urine:450]     Physical Exam: BP 102/56  Pulse 103  Temp(Src) 98.8 F (37.1 C) (Oral)  Resp 22  Ht 5\' 8"  (1.727 m)  Wt 347 lb 3.2 oz (157.489 kg)  BMI 52.80 kg/m2  SpO2 98%  Wt Readings from Last 3 Encounters:  04/30/14 347 lb 3.2 oz (157.489 kg)  04/27/14 345 lb 12 oz (156.831 kg)  04/27/14 345 lb 12.8 oz (156.854 kg)    General: Vital signs reviewed and noted. He is morbidly obese.   Head: Normocephalic, atraumatic.  Eyes: conjunctivae/corneas clear.  EOM's intact.   Throat: normal  Neck:  thick , unable to assess JVD  Lungs:    clear  Heart:  RR, occasional premature beats  Abdomen:  Soft, non-tender, obese  Extremities:  chronic changes, trace edema  Neurologic: A&O X3, CN II - XII are grossly intact.   Psych: Normal     Labs: BMET:  Recent Labs  04/29/14 2305 04/30/14 0426  NA 133*  --   K 3.1*  --   CL 91*  --   CO2 27  --   GLUCOSE 95  --   BUN 16  --   CREATININE 1.20 1.27  CALCIUM 8.8  --     Liver function tests:  Recent Labs  04/29/14 2305  AST 20  ALT 14  ALKPHOS 70  BILITOT 2.0*  PROT 7.8  ALBUMIN 3.3*   No results found for this basename: LIPASE, AMYLASE,  in the last 72 hours  CBC:  Recent Labs  04/29/14 2305 04/30/14 0426  WBC 4.9 5.1  NEUTROABS 3.6  --   HGB 8.9* 9.1*  HCT 28.4* 28.9*  MCV 80.9 81.2  PLT 314 334    Cardiac Enzymes:  Recent Labs  04/29/14 2305  TROPONINI <0.30    Coagulation Studies: No results found for this basename: LABPROT, INR,  in the last 72 hours  Other: No components found with this basename: POCBNP,  No results found for this basename: DDIMER,  in the last 72 hours No results found for this basename: HGBA1C,  in the last 72 hours No results found for this basename: CHOL, HDL, LDLCALC, TRIG, CHOLHDL,  in the last 72 hours No results found for this  basename: TSH, T4TOTAL, FREET3, T3FREE, THYROIDAB,  in the last 72 hours No results found for this basename: VITAMINB12, FOLATE, FERRITIN, TIBC, IRON, RETICCTPCT,  in the last 72 hours   Other results: EKG :  NSR with frequent PVCs. No St or T wave changes.  Medications:    Infusions: . milrinone 0.375 mcg/kg/min (04/30/14 0853)    Scheduled Medications: . aspirin EC  81 mg Oral Daily  . atorvastatin  80 mg Oral Daily  . bumetanide  4 mg Oral BID  . carvedilol  3.125 mg Oral BID WC  . digoxin  0.125 mg Oral Daily  . heparin  5,000 Units Subcutaneous 3 times per day  . hydrALAZINE  25 mg Oral 3 times per day  . insulin aspart  1-15 Units Subcutaneous TID WC  . insulin glargine  25 Units Subcutaneous QHS  . isosorbide mononitrate  30 mg Oral Daily  . loratadine  10 mg Oral Daily  . magnesium oxide  400 mg Oral BID  . potassium chloride SA  40 mEq Oral BID  . sodium chloride  3 mL Intravenous Q12H      Assessment/ Plan:     1. Tachycardia:   He has been stable.  No VT or SVT here.  Will get Dennis Bast to investigate the Life Vest further .  2. Dilated CM:  Stable.  No changes planned.   On home millrinone  3. HTN:  Stable.  4.  Obstructive sleep apnea:  Uses CPAP  Anticipate DC tomorrow if he has no serious arrhythmias.   Disposition:    Length of Stay: 1  Vesta Mixer, Montez Hageman., MD, [facility] 04/30/2014, 9:44 AM Office [phone] Pager [phone]

## 2014-04-30 NOTE — Plan of Care (Signed)
Problem: Phase I Progression Outcomes Goal: Dyspnea controlled at rest (HF) Outcome: Adequate for Discharge Oxygen saturation stable.  See documentation.

## 2014-04-30 NOTE — ED Notes (Signed)
Cardiology in to assess pt at this time 

## 2014-05-01 DIAGNOSIS — I5022 Chronic systolic (congestive) heart failure: Secondary | ICD-10-CM

## 2014-05-01 LAB — BASIC METABOLIC PANEL
BUN: 17 mg/dL (ref 6–23)
CO2: 28 mEq/L (ref 19–32)
Calcium: 8.8 mg/dL (ref 8.4–10.5)
Chloride: 95 mEq/L — ABNORMAL LOW (ref 96–112)
Creatinine, Ser: 1.43 mg/dL — ABNORMAL HIGH (ref 0.50–1.35)
GFR calc Af Amer: 72 mL/min — ABNORMAL LOW (ref >90)
GFR, EST NON AFRICAN AMERICAN: 62 mL/min — AB (ref >90)
Glucose, Bld: 102 mg/dL — ABNORMAL HIGH (ref 70–99)
POTASSIUM: 3.8 meq/L (ref 3.7–5.3)
Sodium: 137 mEq/L (ref 137–147)

## 2014-05-01 LAB — GLUCOSE, CAPILLARY
Glucose-Capillary: 100 mg/dL — ABNORMAL HIGH (ref 70–99)
Glucose-Capillary: 97 mg/dL (ref 70–99)
Glucose-Capillary: 108 mg/dL — ABNORMAL HIGH (ref 70–99)

## 2014-05-01 MED ORDER — HYDROCODONE-ACETAMINOPHEN 5-325 MG PO TABS
1.0000 | ORAL_TABLET | Freq: Once | ORAL | Status: AC
  Administered 2014-05-01: 1 via ORAL
  Filled 2014-05-01: qty 1

## 2014-05-01 NOTE — Progress Notes (Signed)
Discussed discharge instructions with pt including how and when to call the dr, follow up appts, medications to take at home, diet, and activity. Pt verbalized understanding and denied any questions. Milrinone gtt managed by [facility] prior to discharge.   Juliane Lack, RN

## 2014-05-01 NOTE — Progress Notes (Signed)
[facility]   Patient Status: Active pt with [facility] prior /up to this readmission  [facility] is providing the following services:HHRN, MSW and Home infusion pharmacy team for home IV Milrinone.  [facility] Coordinator team will follow pt and support transition to home when deemed appropriate.   If patient discharges after hours, please call [phone].   [name] 05/01/2014, 9:38 AM

## 2014-05-01 NOTE — Progress Notes (Signed)
Advanced Heart Failure Rounding Note  PCP: Dr. Jonny RuizJohn  Subjective:    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 He has been followed closely in HF clinic chronic home Milrinone 0.375 mcg.   He has been admitted multiple times in the past month d/t CP.   Presented to the ED 5/10 with CP and reports of tachycardia and multiple LifeVest alarms. No SVT or VT here at hospital. Waiting LifeVest report. Weight stable 348 lbs. Cr slightly elevated 1.43.    Objective:   Weight Range:  Vital Signs:   Temp:  [97.4 F (36.3 C)-98.6 F (37 C)] 98 F (36.7 C) (05/11 0703) Pulse Rate:  [88-98] 94 (05/11 0703) Resp:  [18-22] 22 (05/11 0703) BP: (94-105)/(52-86) 105/77 mmHg (05/11 0703) SpO2:  [100 %] 100 % (05/11 0703) Weight:  [348 lb 4.8 oz (157.988 kg)] 348 lb 4.8 oz (157.988 kg) (05/11 0703) Last BM Date: 04/29/14  Weight change: Filed Weights   04/30/14 0300 05/01/14 0703  Weight: 347 lb 3.2 oz (157.489 kg) 348 lb 4.8 oz (157.988 kg)    Intake/Output:   Intake/Output Summary (Last 24 hours) at 05/01/14 0820 Last data filed at 05/01/14 0400  Gross per 24 hour  Intake      0 ml  Output   2250 ml  Net  -2250 ml     Physical Exam: General: Chronically ill appearing. No resp difficulty;  HEENT: normal  Neck: Thick. JVP difficult to assess d/t body habitus but appears 7-8; Carotids 2+ bilaterally; no bruits. No lymphadenopathy or thryomegaly appreciated.  Cor: PMI nonpalpable. Regular rate & rhythm. No rubs, gallops or murmurs.  Lungs: clear  Abdomen: obese soft, nontender, nondistended.Good bowel sounds.  Extremities: no cyanosis, clubbing, rash. LUE double lumen PICC. Chronic venous stasis changes but only trace ankle edema.  Neuro: alert & orientedx3, cranial nerves grossly intact. Moves all 4 extremities w/o  difficulty. Affect pleasant.   Telemetry: SR 80s  Labs: Basic Metabolic Panel:  Recent Labs Lab 04/25/14 1500 04/29/14 2305 04/30/14 0426 05/01/14 0500  NA 135* 133*  --  137  K 3.5* 3.1*  --  3.8  CL 94* 91*  --  95*  CO2 27 27  --  28  GLUCOSE 134* 95  --  102*  BUN 20 16  --  17  CREATININE 1.46* 1.20 1.27 1.43*  CALCIUM 9.1 8.8  --  8.8    Liver Function Tests:  Recent Labs Lab 04/29/14 2305  AST 20  ALT 14  ALKPHOS 70  BILITOT 2.0*  PROT 7.8  ALBUMIN 3.3*   No results found for this basename: LIPASE, AMYLASE,  in the last 168 hours No results found for this basename: AMMONIA,  in the last 168 hours  CBC:  Recent Labs Lab 04/25/14 1500 04/29/14 2305 04/30/14 0426  WBC 5.2 4.9 5.1  NEUTROABS 4.0 3.6  --   HGB 8.6* 8.9* 9.1*  HCT 27.1* 28.4* 28.9*  MCV 79.9 80.9 81.2  PLT 291 314 334    Cardiac Enzymes:  Recent Labs Lab 04/25/14 1500 04/29/14 2305  TROPONINI <0.30 <0.30    BNP: BNP (last 3 results)  Recent Labs  04/22/14 1301 04/25/14 1500 04/29/14 2305  PROBNP 991.4* 1483.0* 960.7*      Imaging: Dg Chest Port 1 [address]View  04/29/2014   CLINICAL DATA:  Shortness of breath  EXAM: PORTABLE CHEST - 1 VIEW  COMPARISON:  04/25/2014  FINDINGS: Unchanged cardiopericardial enlargement. Unremarkable upper mediastinal contours. Cephalized pulmonary blood flow. No consolidation, effusion, or pneumothorax. Stable positioning of right upper extremity PICC, tip at the upper SVC.  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   Electronically Signed   By: Tiburcio Pea M.D.   On: 04/29/2014 23:17      Medications:     Scheduled Medications: . aspirin EC  81 mg Oral Daily  . atorvastatin  80 mg Oral Daily  . bumetanide  4 mg Oral BID  . carvedilol  3.125 mg Oral BID WC  . digoxin  0.125 mg Oral Daily  . heparin  5,000 Units Subcutaneous 3 times per day  . hydrALAZINE  25 mg Oral 3 times per day  . insulin aspart   1-15 Units Subcutaneous TID WC  . insulin glargine  25 Units Subcutaneous QHS  . isosorbide mononitrate  30 mg Oral Daily  . loratadine  10 mg Oral Daily  . magnesium oxide  400 mg Oral BID  . potassium chloride SA  40 mEq Oral BID  . sodium chloride  3 mL Intravenous Q12H     Infusions: . milrinone 0.375 mcg/kg/min (05/01/14 0542)     PRN Medications:  sodium chloride, albuterol, clonazePAM, sodium chloride, sodium chloride, traMADol   Assessment:   1) CP - atypical - normal cors Oct 2014 - nl Tropinin 2) Chronic systolic HF - on milrinone 0.375 3) NICM 4) HTN 5) DM2 6) OSA - wears BiPap 7) CKD stage III    Plan/Discussion:    [name]'s volume status appears at baseline. Cr slightly elevated, but BUN normal. Will continue home dose of bumex 4 mg BID.  Dig level 0.7.   Awaiting results from LifeVest, however no SVT/VT while in hospital. Discussed with Tresa Endo with LifeVest and patient did not have LifeVest on when he arrived and reported calling 1800 number, however did not.   Home later today.  Length of Stay: 2 Carter Kitten CosgroveNP-C 05/01/2014, 8:20 AM  Advanced Heart Failure Team Pager [phone] (M-F; 7a - 4p)  Please contact [facility] for night-coverage after hours (4p -7a ) and weekends on [email or web address]   Patient seen and examined with Ulla Potash, NP. We discussed all aspects of the encounter. I agree with the assessment and plan as stated above. [name] was readmitted with CP and apparent high HRs on his home BP cuff. He continues to have PVCs here but no periods of VT or high HRs. From a HF standpoint he is stable on IV milrinone. Unfortunately he continues to have problematic interactions with important members of our team. He reports that he took off his LifeVest after it was beeping in church for > 2 hours. However, device interrogation shows that he has not been wearing his Vest and that there were no prolonged alarms. There is also no record of him  calling the 1800 support line that he initially said he called but now denies. When he was confronted with his noncompliance with the LifeVest he became belligerent with our EP nurse.   I tried to talk to him about his recurrent problems with his interaction with multiple members of our HF team; however he continually interrupted me and was argumentative. After asking him several times to let me finish my discussion with him, I finally walked out of his room. He then yelled  out of his room asking to be discharged and I told the nursing staff that he was suitable for discharge. [facility] was contacted to re hook up his milrinone at home.   I have discussed his case at the [facility] meeting and we all feel that given his noncompliance and disposition we would be unable to offer him advanced therapies. I will see him back in clinic next week and inform him that if he would like to pursue further care, including CRT of VAD, this would have to be done at another institution such as [facility] or [facility].   Bevelyn Buckles Kaydynce Pat,MD 6:06 PM

## 2014-05-01 NOTE — Discharge Summary (Signed)
Advanced Heart Failure Team  Discharge Summary   Patient ID: Steven Wyatt. MRN: [id number], DOB/AGE: 26-Mar-1976 37 y.o. 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 He has been followed closely in HF clinic chronic home Milrinone 0.375 mcg.   He has been followed closely in the HF clinic and has had multiple admissions in the past 6 months related to HF. On 04/30/14 he presented to the ED after calling EMS and saying his HR was erratic and in the 200s. He informed the admitting MD and the HF team that he had been in church and his LifeVest had gone off for 2 hrs straight. He reports he called the [employer].  During his stay he did not have any SVT or CT at the hospital. LifeVest representative was contacted and patient reported to her and the team he had been wearing it, however when she interrogated it it showed he had not been wearing it except for a little bit of time on 4/26 which at that time he had 3 artifact alarms. He then wore the lifevest for a little bit of time on 4/27. Dr. Gala Wyatt tried to discuss with the patient the concernns of non-compliance and some of the problematic interactions with staff, however he interrupted him multiple times and became argumentative. He yelled out his room once Dr. Gala Wyatt left to be discharged home.   His VSS and volume status were stable. He was discharged back home with [facility] and will be followed up in the HF clinic next week.   Discharge Weight Range:  347-348 lbs Discharge Vitals: Blood pressure 106/59, pulse 108, temperature 97.3 F (36.3 C), temperature source Oral, resp. rate 20, height 5\' 8"  (1.727 m), weight 348 lb 4.8 oz (157.988 kg), SpO2 96.00%.  Labs: Lab Results  Component Value Date   WBC 5.1 04/30/2014   HGB 9.1* 04/30/2014   HCT 28.9* 04/30/2014   MCV 81.2 04/30/2014   PLT 334 04/30/2014    Recent Labs Lab 04/29/14 2305  05/01/14 0500  NA 133*  --  137  K 3.1*  --  3.8  CL 91*  --  95*  CO2 27  --  28  BUN 16  --  17  CREATININE 1.20  < > 1.43*  CALCIUM 8.8  --  8.8  PROT 7.8  --   --   BILITOT 2.0*  --   --   ALKPHOS 70  --   --   ALT 14  --   --   AST 20  --   --   GLUCOSE 95  --  102*  < > = values in this interval not displayed. No results found for this basename: CHOL,  HDL,  LDLCALC,  TRIG   BNP (last 3 results)  Recent Labs  04/22/14 1301 04/25/14 1500 04/29/14 2305  PROBNP 991.4* 1483.0* 960.7*    Diagnostic Studies/Procedures   Dg Chest Port 1 View  04/29/2014  CLINICAL DATA:  Shortness of breath  EXAM: PORTABLE CHEST - 1 VIEW  COMPARISON:  04/25/2014  FINDINGS: Unchanged cardiopericardial enlargement. Unremarkable upper mediastinal contours. Cephalized pulmonary blood flow. No consolidation, effusion, or pneumothorax. Stable positioning of right upper extremity PICC, tip at the upper SVC.  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   Electronically Signed   By: Steven Wyatt M.D.   On: 04/29/2014 23:17    Discharge Medications     Medication List         albuterol 108 (90 BASE) MCG/ACT inhaler  Commonly known as:  PROVENTIL HFA;VENTOLIN HFA  Inhale 2 puffs into the lungs daily as needed for wheezing or shortness of breath.     aspirin EC 81 MG tablet  Take 81 mg by mouth daily.     atorvastatin 80 MG tablet  Commonly known as:  LIPITOR  Take 80 mg by mouth daily.     bumetanide 2 MG tablet  Commonly known as:  BUMEX  Take 2 tablets (4 mg  total) by mouth 2 (two) times daily.     carvedilol 3.125 MG tablet  Commonly known as:  COREG  Take 1 tablet (3.125 mg total) by mouth 2 (two) times daily.     clonazePAM 0.5 MG tablet  Commonly known as:  KLONOPIN  Take 1 tablet (0.5 mg total) by mouth 2 (two) times daily as needed for anxiety.     digoxin 0.125 MG tablet  Commonly known as:  LANOXIN  Take 1 tablet (0.125 mg total) by mouth daily.     glucose blood test strip  Commonly known as:  ACCU-CHEK AVIVA PLUS  Use as directed twice daily to check blood sugar.  Diagnosis code 250.00     hydrALAZINE 25 MG tablet  Commonly known as:  APRESOLINE  Take 1 tablet (25 mg total) by mouth every 8 (eight) hours.     insulin aspart 100 UNIT/ML FlexPen  Commonly known as:  NOVOLOG FLEXPEN  Inject 1-15 Units into the skin 3 (three) times daily with meals. Sliding scale     insulin glargine 100 UNIT/ML injection  Commonly known as:  LANTUS  Inject 0.25 mLs (25 Units total) into the skin at bedtime.     isosorbide mononitrate 30 MG 24 hr tablet  Commonly known as:  IMDUR  Take 1 tablet (30 mg total) by mouth daily.     loratadine 10 MG tablet  Commonly known as:  CLARITIN  Take 1 tablet (10 mg total) by mouth daily.     magnesium oxide 400 MG tablet  Commonly known as:  MAG-OX  Take 1 tablet (400 mg total) by mouth 2 (two) times daily.     milrinone 20 MG/100ML Soln infusion  Commonly known as:  PRIMACOR  Inject 62.7375 mcg/min into the vein continuous.     potassium chloride SA 20 MEQ tablet  Commonly known as:  K-DUR,KLOR-CON  Take 2 tablets (40 mEq total) by mouth 2 (two) times daily.     traMADol 50 MG tablet  Commonly known as:  ULTRAM  Take 2 tablets (100 mg total) by mouth every 6 (six) hours as needed for moderate pain.        Disposition   The patient will be discharged in stable condition to home. Discharge Orders   Future Appointments Provider Department Dept Phone   05/08/2014 3:20 PM [facility] [phone]   05/17/2014 10:45 AM Steven Salvia,  MD [facility] [address] Office [phone]   08/01/2014 1:30 PM Steven LevinsJames W John, MD  [facility] [phone]   Future Orders Complete By Expires   Beta Blocker already ordered  As directed    Contraindication to ACEI at discharge  As directed    Diet - low sodium heart healthy  As directed    Heart Failure patients record your daily weight using the same scale at the same time of day  As directed    Increase activity slowly  As directed    STOP any activity that causes chest pain, shortness of breath, dizziness, sweating, or exessive weakness  As directed      Follow-up Information   Follow up with [facility] On 05/08/2014. (@ 3:20 pm: gate code [id number])    Specialty:  Cardiology   Contact information:   [address] [id number] [place] [id number] [phone]        Duration of Discharge Encounter: Greater than 35 minutes   Signed, Aundria Rudli B Kaylee Trivett  05/01/2014, 6:05 PM

## 2014-05-03 NOTE — Care Management Note (Addendum)
  Page 1 of 1   05/03/2014     5:14:17 PM CARE MANAGEMENT NOTE 05/03/2014  Patient:  Steven Wyatt, Steven Wyatt   Account Number:  [id number]  Date Initiated:  05/03/2014  Documentation initiated by:  Steven Wyatt  Subjective/Objective Assessment:   Admitted with chest pain     Action/Plan:   CM to follow for dispositon needs   Anticipated DC Date:  05/01/2014   Anticipated DC Plan:  HOME W HOME HEALTH SERVICES         Choice offered to / List presented to:          Steven Wyatt arranged  HH-1 RN      Steven Wyatt agency  Steven Wyatt.   Status of service:  Completed, signed off Medicare Important Message given?   (If response is "NO", the following Medicare IM given date fields will be blank) Date Medicare IM given:   Date Additional Medicare IM given:    Discharge Disposition:  HOME W HOME HEALTH SERVICES  Per UR Regulation:  Reviewed for med. necessity/level of care/duration of stay  If discussed at Long Length of Stay Meetings, dates discussed:    Comments:  Steven Ryder RN, BSN, MSHL, CCM  Nurse - Case Manager, (Unit [facility])  [phone]  05/03/2014 milrinone Steven Wyatt) infusion 200 mcg/mL (0.2 mg/ml) administered this admission From home with family Life Vest HF clinic: yes - SW / Steven Wyatt to asssit HHS:  [facility] IV Milronone gtt d/c home 05/01/2014

## 2014-05-03 NOTE — Progress Notes (Signed)
Retro  UR completed Paxton Kanaan K. Jakyah Bradby, RN, BSN, MSHL, CCM  05/03/2014 5:15 PM

## 2014-05-04 NOTE — ED Provider Notes (Signed)
Medical screening examination/treatment/procedure(s) were conducted as a shared visit with non-physician practitioner(s) and myself.  I personally evaluated the patient during the encounter.   EKG Interpretation   Date/Time:  Saturday Apr 29 2014 22:09:40 EDT Ventricular Rate:  106 PR Interval:  190 QRS Duration: 141 QT Interval:  374 QTC Calculation: 497 R Axis:   -56 Text Interpretation:  Sinus tachycardia Ventricular bigeminy Probable left  atrial enlargement Left bundle branch block Similar to prior Confirmed by  [facility]  MD, Asucena Galer ([id number]) on 04/29/2014 10:16:07 PM      Patient with hx of CHF, presents with acute onset of CP and tachycardia. He recorded his HR in the 220s. Hx of CHF on milrinone at home. He is hemodynamically stable, no EKG CHANGES TODAY. Cardiology admitting.  Dagmar Hait, MD 05/04/14 [phone]

## 2014-05-05 ENCOUNTER — Encounter: Payer: Self-pay | Admitting: Internal Medicine

## 2014-05-08 ENCOUNTER — Ambulatory Visit (HOSPITAL_COMMUNITY)
Admit: 2014-05-08 | Discharge: 2014-05-08 | Disposition: A | Discharge status: Home / Self Care | Payer: Medicare Other | Attending: Internal Medicine | Admitting: Internal Medicine

## 2014-05-08 VITALS — BP 92/58 | HR 107 | Wt 321.0 lb

## 2014-05-08 DIAGNOSIS — N189 Chronic kidney disease, unspecified: Secondary | ICD-10-CM | POA: Diagnosis not present

## 2014-05-08 DIAGNOSIS — G4733 Obstructive sleep apnea (adult) (pediatric): Secondary | ICD-10-CM | POA: Diagnosis not present

## 2014-05-08 DIAGNOSIS — E119 Type 2 diabetes mellitus without complications: Secondary | ICD-10-CM | POA: Diagnosis not present

## 2014-05-08 DIAGNOSIS — E662 Morbid (severe) obesity with alveolar hypoventilation: Secondary | ICD-10-CM | POA: Diagnosis not present

## 2014-05-08 DIAGNOSIS — Z79899 Other long term (current) drug therapy: Secondary | ICD-10-CM | POA: Insufficient documentation

## 2014-05-08 DIAGNOSIS — I509 Heart failure, unspecified: Secondary | ICD-10-CM | POA: Diagnosis not present

## 2014-05-08 DIAGNOSIS — I129 Hypertensive chronic kidney disease with stage 1 through stage 4 chronic kidney disease, or unspecified chronic kidney disease: Secondary | ICD-10-CM | POA: Insufficient documentation

## 2014-05-08 DIAGNOSIS — E785 Hyperlipidemia, unspecified: Secondary | ICD-10-CM | POA: Insufficient documentation

## 2014-05-08 DIAGNOSIS — I428 Other cardiomyopathies: Secondary | ICD-10-CM | POA: Insufficient documentation

## 2014-05-08 DIAGNOSIS — Z7982 Long term (current) use of aspirin: Secondary | ICD-10-CM | POA: Diagnosis not present

## 2014-05-08 DIAGNOSIS — I447 Left bundle-branch block, unspecified: Secondary | ICD-10-CM | POA: Insufficient documentation

## 2014-05-08 DIAGNOSIS — Z794 Long term (current) use of insulin: Secondary | ICD-10-CM | POA: Diagnosis not present

## 2014-05-08 DIAGNOSIS — I5022 Chronic systolic (congestive) heart failure: Secondary | ICD-10-CM | POA: Insufficient documentation

## 2014-05-08 DIAGNOSIS — Z6841 Body Mass Index (BMI) 40.0 and over, adult: Secondary | ICD-10-CM

## 2014-05-08 DIAGNOSIS — R55 Syncope and collapse: Secondary | ICD-10-CM | POA: Insufficient documentation

## 2014-05-08 NOTE — Patient Instructions (Signed)
Take Mag-ox 400 mg Twice daily   Your physician recommends that you schedule a follow-up appointment in: 1 month

## 2014-05-08 NOTE — Progress Notes (Signed)
Patient ID: Steven Wyatt., male   DOB: July 20, 1976, 38 y.o.   MRN: [id number]   PCP: Dr. Jonny Ruiz  HPI: Steven Wyatt is a 38 yo male with a history of NICM (nor cors Oct 2014), chronic systolic HF - EF 40%, LBBB, morbid obesity, OSA, HTN, CKD and HLD.   Admitted 1/13-1/18/15 for A/C HF. He was placed on IV lasix gtt and milrinone, however signed out AMA. During his admission his weight decreased from 405 to 368 lbs. ECHO showed EF 20% with diff HK, grade II DD, mod pulm HTN and mod sev LAE. His Cr bumped to 1.56. CO-OX when he left AMA was 35% on 01/08/14.   Readmitted 01/11/14 after he had syncopal episode.  Diuresed with lasix drip and metolazone. He was transitioned to bumex 4 mg twice a day. He was not be placed on Ace/Spiro due to CKD. He is not currently LVAD/transplant candidate due to obesity, noncompliance, RV dysfunction, and CKD. Discharged on home Milrinone at 0.375 mcg with [facility] to follow. Discharge weight 357 pounds.   Admitted 4/9-4/11/15 for perirectal abscess which he had I/D and was discharged. His Cr increased and was readmitted 4/20 for CP and volume overload after reduction in milrinone to 0.25. It was increased back to 0.375, however had 30 beat run VT and LifeVest was arranged. Weight on discharge was 348 lbs (04/15/14). Presented back to hospital 5/2 and was admitted again for CP. He had been out of his medications for 1 week. Diuresed and discharged 04/24/14 at weight of 343 lbs. Presented to ED 04/25/14 and gave him 40 meq of potassium and fentanyl  Echo 4/15 EF 35% (?)  - very poor windows   Follow up: Has had several brief admissions/ER visits for CP and palpitations. Last week admitted for palpitations. LifeVest interrogated and it was discovered that he was not wearing it routinely and he got into an argument with Dennis Bast, RN about this. During last hospitalization I told that he was not VAD candidate here due to noncompliance and ongoing difficulty getting along with staff  members. Has lost 25 pounds over the past week. Says he is not sure how it happened. Has only taken metolazone once. Did increase bumex to 6mg  for a few days. Still with Class III DOE and occasional CP. No dizziness.   Labs 01/15/14 K 3.3 Creatinine 1.75  Labs 01/16/14 K 3.5 Creatinine 1.65  Labs 3/15 K 2.9 => 3.7, creatinine 1.34 => 1.36, HCT 33.5 Labs 04/2014: K+ 3.5 and creatinine 1.46, Troponin 0.30, pro-BNP 1483, Hgb 8.6 Labs 05/01/14 Cr 1.43  ECG: sinus tachy at 114, LBBB with QRS 146 msec   FH: No CHF, no SCD that he knows of.  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  Moved from [place].   ROS: All systems negative except as listed in HPI, PMH and Problem List.  PMH: 1. Nonischemic cardiomyopathy: Initial workup in [place].  LHC (10/14) with normal coronaries.  Echo (1/15) with EF 20%, mild diffuse hypokinesis, moderately dilated LV, moderately dilated RV with moderately decreased systolic function, PA systolic pressure 59 mmHg.  1/15 low output heart failure, milrinone begun.  2. Morbid obesity 3. OHS/OSA on Bipap.  4. HTN 5. Hyperlipidemia 6. CKD: Suspect cardiorenal syndrome 7. Syncope: Vasovagal in setting of vomiting 8. Type II diabetes: Borderline.   Current Outpatient Prescriptions  Medication Sig Dispense Refill  . albuterol (PROVENTIL HFA;VENTOLIN HFA) 108 (90 BASE) MCG/ACT  inhaler Inhale 2 puffs into the lungs daily as needed for wheezing or shortness of breath.      Marland Kitchen aspirin EC 81 MG tablet Take 81 mg by mouth daily.      Marland Kitchen atorvastatin (LIPITOR) 80 MG tablet Take 80 mg by mouth daily.      . bumetanide (BUMEX) 2 MG tablet Take 2 tablets (4 mg total) by mouth 2 (two) times daily.  120 tablet  6  . carvedilol (COREG) 3.125 MG tablet Take 1 tablet (3.125 mg total) by mouth 2 (two) times daily.  60 tablet  3  . clonazePAM (KLONOPIN) 0.5 MG tablet Take 1 tablet (0.5 mg total) by mouth 2 (two) times daily as needed for  anxiety.  60 tablet  2  . digoxin (LANOXIN) 0.125 MG tablet Take 1 tablet (0.125 mg total) by mouth daily.  30 tablet  6  . glucose blood (ACCU-CHEK AVIVA PLUS) test strip Use as directed twice daily to check blood sugar.  Diagnosis code 250.00  50 each  11  . hydrALAZINE (APRESOLINE) 25 MG tablet Take 1 tablet (25 mg total) by mouth every 8 (eight) hours.  90 tablet  6  . insulin aspart (NOVOLOG FLEXPEN) 100 UNIT/ML FlexPen Inject 1-15 Units into the skin 3 (three) times daily with meals. Sliding scale  15 mL  11  . insulin glargine (LANTUS) 100 UNIT/ML injection Inject 0.25 mLs (25 Units total) into the skin at bedtime.  10 mL  11  . isosorbide mononitrate (IMDUR) 30 MG 24 hr tablet Take 1 tablet (30 mg total) by mouth daily.  30 tablet  6  . loratadine (CLARITIN) 10 MG tablet Take 1 tablet (10 mg total) by mouth daily.  90 tablet  3  . magnesium oxide (MAG-OX) 400 MG tablet Take 1 tablet (400 mg total) by mouth 2 (two) times daily.  60 tablet  3  . milrinone (PRIMACOR) 20 MG/100ML SOLN infusion Inject 62.7375 mcg/min into the vein continuous.  100 mL  6  . potassium chloride SA (K-DUR,KLOR-CON) 20 MEQ tablet Take 2 tablets (40 mEq total) by mouth 2 (two) times daily.  120 tablet  3  . traMADol (ULTRAM) 50 MG tablet Take 2 tablets (100 mg total) by mouth every 6 (six) hours as needed for moderate pain.  30 tablet  0   No current facility-administered medications for this encounter.    Filed Vitals:   05/08/14 1524  BP: 92/58  Pulse: 107  Weight: 321 lb (145.605 kg)  SpO2: 96%    PHYSICAL EXAM: General:  Stable appearing. No resp difficulty; wife present HEENT: normal Neck: Thick. JVP difficult to assess d/t body habitus but appears 8; Carotids 2+ bilaterally; no bruits. No lymphadenopathy or thryomegaly appreciated. Cor: PMI nonpalpable. Regular rate & rhythm. +s3 Lungs: clear Abdomen: obese soft, nontender, nondistended.Good bowel sounds. Extremities: no cyanosis, clubbing, rash.  LUE double lumen PICC. Chronic venous stasis changes but only trace ankle edema.  Neuro: alert & orientedx3, cranial nerves grossly intact. Moves all 4 extremities w/o difficulty. Affect pleasant.  ASSESSMENT & PLAN:  1. Chronic Systolic Heart Failure: Nonischemic cardiomyopathy with LBBB. EF reported to be 35% by echo on 03/2014 but likely worse (very poor images) - Currently NYHA III symptoms on milrinone and volume status improved. Failed milrinone wean with co-ox in the 30%s recently. Will continue current meds. I have informed him that currently he is not a candidate for VAD here due to noncompliance and inability to work with our  staff. He has an appointment with Dr. Graciela HusbandsKlein tomorrow to discuss CRT-D. I discussed with him that he may also not be candidate for this given his problems with noncompliance and that [facility] may have [facility].  2. OSA/OHS:  continue nightly BiPap.  3. CKD: Stable creatinine.  4. DM2: Followed by PCP  5. Hypomagnesemia - increase magox to 400 bid  Total time spent 40 minutes. Over 3/4 of that time spent discussing above.   Bevelyn Bucklesaniel R Jatniel Verastegui,MD 3:52 PM

## 2014-05-09 ENCOUNTER — Encounter: Payer: Self-pay | Admitting: Internal Medicine

## 2014-05-09 ENCOUNTER — Encounter (INDEPENDENT_AMBULATORY_CARE_PROVIDER_SITE_OTHER): Payer: Medicare Other

## 2014-05-09 ENCOUNTER — Encounter: Payer: Self-pay | Admitting: *Deleted

## 2014-05-09 ENCOUNTER — Ambulatory Visit (INDEPENDENT_AMBULATORY_CARE_PROVIDER_SITE_OTHER): Payer: Medicare Other | Admitting: Internal Medicine

## 2014-05-09 VITALS — BP 100/69 | HR 95 | Ht 68.0 in | Wt 335.0 lb

## 2014-05-09 DIAGNOSIS — I4949 Other premature depolarization: Secondary | ICD-10-CM

## 2014-05-09 DIAGNOSIS — I5022 Chronic systolic (congestive) heart failure: Secondary | ICD-10-CM

## 2014-05-09 DIAGNOSIS — I493 Ventricular premature depolarization: Secondary | ICD-10-CM

## 2014-05-09 DIAGNOSIS — I428 Other cardiomyopathies: Secondary | ICD-10-CM

## 2014-05-09 DIAGNOSIS — I447 Left bundle-branch block, unspecified: Secondary | ICD-10-CM

## 2014-05-09 NOTE — Progress Notes (Signed)
ELECTROPHYSIOLOGY CONSULT NOTE  Patient ID: Steven Wyatt., MRN: [id number], DOB/AGE: [birth year] 37 y.o. Admit date: (Not on file) Date of Consult: 05/09/2014  Primary Physician: Oliver Barre, MD Primary Cardiologist: DB  Chief Complaint: CHF   HPI [name]. is a 38 y.o. male  Referred for consideration of CRT. He has a history of nonischemic cardiomyopathy (normal coronary arteries 10/14) with ongoing milrinone dependent systolic heart failure morbid obesity not withstanding an interval weight loss and 90 pounds.  Efforts to wean his home milrinone were associated with a significant drop in his CoOX   He has been considered for LVAD; however, for a variety of reasons he is thought not to be a candidate for that.   I have discussed his case at the [facility] meeting and we all feel that given his noncompliance and disposition we would be unable to offer him advanced therapies. I will see him back in clinic next week and inform him that if he would like to pursue further care, including CRT of VAD, this would have to be done at another institution such as [facility] or [facility]   He has been prescribed a LifeVest which he has not been wearing. There is some altercations between the patient and the LifeVest nurse regarding compliance.  He has recently undergone treatment for her rectal abscess which is improving.  He has episodes of chest pain. This has prompted hospitalizations numerous times over recent months. It is his impression that these are preceded almost always by palpitations. These are also followed by significant shortness of breath.  He has had multiple tracings on which PVCs has been identified.  He is also problems with tachycardia with heart rates reportedly as high as 160--200  He denies syncope; the chart however describes vasovagal syncope in the setting of vomiting  He has had complaints of nausea and anorexia over the last couple of weeks accompanied by vomiting  He  has chronic renal disease.  Past Medical History  Diagnosis Date  . Chronic systolic CHF (congestive heart failure)     a. on home milrinone - dose titrate to 0.383mcg/kg/min 03/2014.  . Diabetes   . HTN (hypertension)   . Morbid obesity with BMI of 50.0-59.9, adult   . Sleep apnea     on Bi Pap  . Dyslipidemia   . CKD (chronic kidney disease), stage III   . Asthma 01/03/2014  . DKA (diabetic ketoacidoses) 02/25/2014  . Hypokalemia 01/04/2014  . Hyponatremia 02/25/2014  . Microcytic anemia 01/04/2014  . 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 Normal coronary arteries- last cath Oct 2014 at [facility] 01/03/2014  . History of chicken pox   . Perirectal abscess 03/2014  . Allergic rhinitis, cause unspecified 04/29/2014  . Shortness of breath   . Anxiety       Surgical History:  Past Surgical History  Procedure Laterality Date  . Incision and drainage abscess N/A 03/31/2014    Procedure: INCISION AND DRAINAGE  PERI-RECTAL ABSCESS;  Surgeon: Axel Filler, MD;  Location: [facility] OR;  Service: General;  Laterality: N/A;     Home Meds: Prior to Admission medications   Medication Sig Start Date End Date Taking? Authorizing Provider  albuterol (PROVENTIL HFA;VENTOLIN HFA) 108 (90 BASE) MCG/ACT inhaler Inhale 2 puffs into the lungs daily as needed for wheezing or shortness of breath. 01/08/14  Yes Wilburt Finlay, PA-C  aspirin EC 81 MG tablet Take 81 mg by mouth daily.   Yes Historical Provider, MD  atorvastatin (LIPITOR) 80 MG tablet Take 80 mg by mouth daily. 01/08/14  Yes Wilburt Finlay, PA-C  bumetanide (BUMEX) 2 MG tablet Take 2 tablets (4 mg total) by mouth 2 (two) times daily. 01/16/14  Yes Amy D Clegg, NP  carvedilol (COREG) 3.125 MG tablet Take 1 tablet (3.125 mg total) by mouth 2 (two) times daily. 02/20/14  Yes Dolores Patty, MD  clonazePAM (KLONOPIN) 0.5 MG tablet Take 1 tablet (0.5 mg total) by mouth 2 (two) times daily as needed for anxiety. 04/27/14  Yes Corwin Levins, MD  digoxin  (LANOXIN) 0.125 MG tablet Take 1 tablet (0.125 mg total) by mouth daily. 01/16/14  Yes Amy D Clegg, NP  glucose blood (ACCU-CHEK AVIVA PLUS) test strip Use as directed twice daily to check blood sugar.  Diagnosis code 250.00 04/27/14  Yes Corwin Levins, MD  hydrALAZINE (APRESOLINE) 25 MG tablet Take 1 tablet (25 mg total) by mouth every 8 (eight) hours. 01/23/14  Yes Amy D Clegg, NP  insulin aspart (NOVOLOG FLEXPEN) 100 UNIT/ML FlexPen Inject 1-15 Units into the skin 3 (three) times daily with meals. Sliding scale 04/27/14  Yes Corwin Levins, MD  insulin glargine (LANTUS) 100 UNIT/ML injection Inject 0.25 mLs (25 Units total) into the skin at bedtime. 04/27/14  Yes Corwin Levins, MD  isosorbide mononitrate (IMDUR) 30 MG 24 hr tablet Take 1 tablet (30 mg total) by mouth daily. 01/16/14  Yes Amy D Clegg, NP  loratadine (CLARITIN) 10 MG tablet Take 1 tablet (10 mg total) by mouth daily. 04/27/14  Yes Corwin Levins, MD  magnesium oxide (MAG-OX) 400 MG tablet Take 1 tablet (400 mg total) by mouth 2 (two) times daily. 03/07/14  Yes Aundria Rud, NP  milrinone [facility]) 20 MG/100ML SOLN infusion Inject 62.7375 mcg/min into the vein continuous. 04/15/14  Yes Ok Anis, NP  potassium chloride SA (K-DUR,KLOR-CON) 20 MEQ tablet Take 2 tablets (40 mEq total) by mouth 2 (two) times daily. 04/15/14  Yes Ok Anis, NP  traMADol (ULTRAM) 50 MG tablet Take 2 tablets (100 mg total) by mouth every 6 (six) hours as needed for moderate pain. 04/15/14  Yes Ok Anis, NP       Allergies:  Allergies  Allergen Reactions  . Iodine Anaphylaxis  . Shellfish Allergy Anaphylaxis    History   Social History  . Marital Status: Married    Spouse Name: N/A    Number of Children: N/A  . Years of Education: N/A   Occupational History  . Clinical Social Worker    Social History Main Topics  . Smoking status: Never Smoker   . Smokeless tobacco: Never Used  . Alcohol Use: No  . Drug Use: No  . Sexual  Activity: Yes   Other Topics Concern  . Not on file   Social History Narrative   Lives with wife and 2/5 children.     Family History  Problem Relation Age of Onset  . Hypertension Mother   . Diabetes Maternal Grandmother   . Liver disease Father      ROS:  Please see the history of present illness.     All other systems reviewed and negative.    Physical Exam:   Blood pressure 100/69, pulse 95, height 5\' 8"  (1.727 m), weight 335 lb (151.955 kg). General: Well developed, morbidly obese African American age appearing  male in no acute distress. Head: Normocephalic, atraumatic, sclera non-icteric, no xanthomas, nares are without discharge. EENT: normal  Lymph Nodes:  none Back: without scoliosis/kyphosis, no CVA tendersness Neck: Negative for carotid bruits. JVD impossible to to assess Lungs: Clear bilaterally to auscultation without wheezes, rales, or rhonchi. Breathing is unlabored. Heart: RRR with S1 S2. No  murmur , rubs, or gallops appreciated. Abdomen: Soft, non-tender, non-distended with normoactive bowel sounds. No hepatomegaly. No rebound/guarding. No obvious abdominal masses. Msk:  Strength and tone appear normal for age. Extremities: No clubbing or cyanosis. Thickened skin but only trace* edema.  Distal pedal pulses are 2+ and equal bilaterally. Skin: Warm and Dry Neuro: Alert and oriented X 3. CN III-XII intact Grossly normal sensory and motor function . Psych:  Responds to questions appropriately with a normal affect.      Labs: Cardiac Enzymes No results found for this basename: CKTOTAL, CKMB, TROPONINI,  in the last 72 hours CBC Lab Results  Component Value Date   WBC 5.1 04/30/2014   HGB 9.1* 04/30/2014   HCT 28.9* 04/30/2014   MCV 81.2 04/30/2014   PLT 334 04/30/2014   PROTIME: No results found for this basename: LABPROT, INR,  in the last 72 hours Chemistry No results found for this basename: NA, K, CL, CO2, BUN, CREATININE, CALCIUM, LABALBU, PROT,  BILITOT, ALKPHOS, ALT, AST, GLUCOSE,  in the last 168 hours Lipids No results found for this basename: CHOL, HDL, LDLCALC, TRIG   BNP Pro B Natriuretic peptide (BNP)  Date/Time Value Ref Range Status  04/29/2014 11:05 PM 960.7* 0 - 125 pg/mL Final  04/25/2014  3:00 PM 1483.0* 0 - 125 pg/mL Final  04/22/2014  1:01 PM 991.4* 0 - 125 pg/mL Final  04/10/2014  6:59 PM 1531.0* 0 - 125 pg/mL Final   Miscellaneous Lab Results  Component Value Date   DDIMER 1.85* 04/10/2014    Radiology/Studies:  Dg Chest 2 View  04/22/2014   CLINICAL DATA:  Short of breath.  Chest pain.  EXAM: CHEST  2 VIEW  COMPARISON:  03/2014  FINDINGS: Cardiac silhouette is mildly enlarged. Normal mediastinal and hilar contours.  Hazy airspace opacity is seen, most prominent centrally. No pleural effusion. No pneumothorax.  Bony thorax is intact. Right PICC has its tip in the mid superior vena cava.  IMPRESSION: Hazy central lung opacities suggest mild pulmonary edema, but could reflect bilateral infectious or inflammatory infiltrates. Appearance is similar to the most recent study.   Electronically Signed   By: Amie Portlandavid  Ormond M.D.   On: 04/22/2014 13:41   Dg Chest 2 View  04/10/2014   CLINICAL DATA:  Left-sided chest pain and shortness of breath for 3 days.  EXAM: CHEST  2 VIEW  COMPARISON:  02/24/2014  FINDINGS: Cardiac enlargement with pulmonary vascular congestion. Vascular congestion is increasing and there is interval development of interstitial edema and small bilateral pleural effusions since the previous study. Right PICC line remains unchanged in position.  IMPRESSION: Cardiac enlargement with increasing pulmonary vascular congestion and developing edema.   Electronically Signed   By: Burman NievesWilliam  Stevens M.D.   On: 04/10/2014 21:58   Dg Chest Port 1 View  04/29/2014   CLINICAL DATA:  Shortness of breath  EXAM: PORTABLE CHEST - 1 VIEW  COMPARISON:  04/25/2014  FINDINGS: Unchanged cardiopericardial enlargement. Unremarkable upper  mediastinal contours. Cephalized pulmonary blood flow. No consolidation, effusion, or pneumothorax. Stable positioning of right upper extremity PICC, tip at the upper SVC.  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   Electronically Signed   By: Tiburcio PeaJonathan  Watts M.D.   On: 04/29/2014 23:17  Dg Chest Port 1 View  04/25/2014   CLINICAL DATA:  Shortness of breath with chest pain.  EXAM: PORTABLE CHEST - 1 VIEW  COMPARISON:  04/22/2014.  FINDINGS: Moderate enlargement cardiac silhouette, likely cardiomegaly. Worsening bilateral pulmonary opacities, likely pulmonary edema. No effusion or pneumothorax. PICC line tip remains at the proximal SVC. Increased body habitus.  IMPRESSION: Worsening aeration. Developing pulmonary edema. Moderate cardiac silhouette enlargement.   Electronically Signed   By: Davonna Belling M.D.   On: 04/25/2014 14:30    EKG   Assessment and Plan:   Nonischemic cardiomyopathy  Ventricular ectopy  Left bundle branch block? Rate related  Tachypalpitations? Mechanism  Congestive heart failure-chronic-systolic class IVA-milrinone dependent  Obstructive sleep apnea  Fatigue  Morbid obesity  The patient has significant symptoms of shortness of breath and fatigue. He is on milrinone. The question is whether he is a candidate for CRT. His left bundle branch block appears to be rate related but this may be moot.  The other issue that immediately begs an answer is whether the PVCs are causative to the cardiomyopathy. To this and we will monitor   for 48 hours and quantitate his PVC burden.  This will then inform our next decision. In addition, his echocardiogram was noted as being very difficult to read because of his windows; an echo with contrast was recommended. We will pursue this.  He also has significant palpitations. These often or trigger his hospitalizations. According to him his O2 sat machine heart rate 160-200. We will recommend that he  used to live core monitor to try to identify the rhythm with this. Alternatively we could use an event recorder.  I also appreciate that there have been major issues related to his cooperation with the heart failure team. I'm a little bit reluctant to consider device utilization she or her with the anticipation that other device support would be required at a different institution   [facility]

## 2014-05-09 NOTE — Patient Instructions (Addendum)
Your physician recommends that you continue on your current medications as directed. Please refer to the Current Medication list given to you today.  Your physician has requested that you have an echocardiogram. Echocardiography is a painless test that uses sound waves to create images of your heart. It provides your doctor with information about the size and shape of your heart and how well your heart's chambers and valves are working. This procedure takes approximately one hour. There are no restrictions for this procedure.  Your physician has recommended that you wear a 48 hour holter monitor. Holter monitors are medical devices that record the heart's electrical activity. Doctors most often use these monitors to diagnose arrhythmias. Arrhythmias are problems with the speed or rhythm of the heartbeat. The monitor is a small, portable device. You can wear one while you do your normal daily activities. This is usually used to diagnose what is causing palpitations/syncope (passing out).  Please purchase [employer]ive[employer] monitor for your phone.  Follow up to be determined after tests completed.

## 2014-05-09 NOTE — Progress Notes (Signed)
Patient ID: Steven Carota., male   DOB: 05-Feb-1976, 38 y.o.   MRN: [id number] E-Cardio 48 hour holter monitor applied to patient.

## 2014-05-10 ENCOUNTER — Other Ambulatory Visit: Payer: Self-pay | Admitting: *Deleted

## 2014-05-10 ENCOUNTER — Telehealth: Payer: Self-pay | Admitting: *Deleted

## 2014-05-10 ENCOUNTER — Encounter (HOSPITAL_COMMUNITY): Payer: Medicare Other

## 2014-05-10 NOTE — Telephone Encounter (Signed)
Received fax pt needing Pa on his novolog insulin. Completed on cover-my-meds. Received confirmation fax med has been approved. Notified pharmacy spoke with shiney gave approval status...Raechel Chute

## 2014-05-10 NOTE — Telephone Encounter (Signed)
Received fax pt needing PA on his lantus. Completed on cover-my-meds received confirmation fax back med has been approved through 12/21/14. Notified pharmacy spoke with Edson Snowball gave approval status...Raechel Chute

## 2014-05-11 ENCOUNTER — Ambulatory Visit (HOSPITAL_COMMUNITY): Payer: Medicare Other

## 2014-05-12 ENCOUNTER — Encounter: Payer: Self-pay | Admitting: Adult Health

## 2014-05-12 ENCOUNTER — Ambulatory Visit (HOSPITAL_COMMUNITY): Payer: Medicare Other

## 2014-05-12 ENCOUNTER — Telehealth (HOSPITAL_COMMUNITY): Payer: Self-pay | Admitting: *Deleted

## 2014-05-12 NOTE — Telephone Encounter (Signed)
Received labs from [facility], K 3.2 discussed with Ulla Potash, NP she would like pt to take extra 40 meq of KCL today and tomorrow, have attempted to call pt sveral times however phone states "unable to accept calls at this time" will try again next week

## 2014-05-13 ENCOUNTER — Other Ambulatory Visit: Payer: Self-pay

## 2014-05-13 ENCOUNTER — Emergency Department (HOSPITAL_COMMUNITY)
Admission: EM | Admit: 2014-05-13 | Discharge: 2014-05-14 | Disposition: A | Discharge status: Home / Self Care | Payer: Medicare Other | Source: Home / Self Care | Attending: Emergency Medicine | Admitting: Emergency Medicine

## 2014-05-13 ENCOUNTER — Emergency Department (HOSPITAL_COMMUNITY): Payer: Medicare Other

## 2014-05-13 ENCOUNTER — Encounter (HOSPITAL_COMMUNITY): Payer: Self-pay | Admitting: Emergency Medicine

## 2014-05-13 DIAGNOSIS — R0602 Shortness of breath: Secondary | ICD-10-CM | POA: Diagnosis not present

## 2014-05-13 DIAGNOSIS — Z7982 Long term (current) use of aspirin: Secondary | ICD-10-CM | POA: Insufficient documentation

## 2014-05-13 DIAGNOSIS — I429 Cardiomyopathy, unspecified: Secondary | ICD-10-CM

## 2014-05-13 DIAGNOSIS — R0789 Other chest pain: Secondary | ICD-10-CM

## 2014-05-13 DIAGNOSIS — I4949 Other premature depolarization: Secondary | ICD-10-CM | POA: Diagnosis not present

## 2014-05-13 DIAGNOSIS — Z8719 Personal history of other diseases of the digestive system: Secondary | ICD-10-CM

## 2014-05-13 DIAGNOSIS — J45901 Unspecified asthma with (acute) exacerbation: Secondary | ICD-10-CM

## 2014-05-13 DIAGNOSIS — I129 Hypertensive chronic kidney disease with stage 1 through stage 4 chronic kidney disease, or unspecified chronic kidney disease: Secondary | ICD-10-CM

## 2014-05-13 DIAGNOSIS — Z862 Personal history of diseases of the blood and blood-forming organs and certain disorders involving the immune mechanism: Secondary | ICD-10-CM

## 2014-05-13 DIAGNOSIS — F411 Generalized anxiety disorder: Secondary | ICD-10-CM

## 2014-05-13 DIAGNOSIS — Z8619 Personal history of other infectious and parasitic diseases: Secondary | ICD-10-CM | POA: Insufficient documentation

## 2014-05-13 DIAGNOSIS — E876 Hypokalemia: Secondary | ICD-10-CM

## 2014-05-13 DIAGNOSIS — Z794 Long term (current) use of insulin: Secondary | ICD-10-CM

## 2014-05-13 DIAGNOSIS — R079 Chest pain, unspecified: Secondary | ICD-10-CM

## 2014-05-13 DIAGNOSIS — I5022 Chronic systolic (congestive) heart failure: Secondary | ICD-10-CM | POA: Insufficient documentation

## 2014-05-13 DIAGNOSIS — I428 Other cardiomyopathies: Secondary | ICD-10-CM

## 2014-05-13 DIAGNOSIS — E1110 Type 2 diabetes mellitus with ketoacidosis without coma: Secondary | ICD-10-CM

## 2014-05-13 DIAGNOSIS — N189 Chronic kidney disease, unspecified: Secondary | ICD-10-CM

## 2014-05-13 DIAGNOSIS — N183 Chronic kidney disease, stage 3 unspecified: Secondary | ICD-10-CM | POA: Insufficient documentation

## 2014-05-13 DIAGNOSIS — Z79899 Other long term (current) drug therapy: Secondary | ICD-10-CM | POA: Insufficient documentation

## 2014-05-13 LAB — CBG MONITORING, ED: Glucose-Capillary: 193 mg/dL — ABNORMAL HIGH (ref 70–99)

## 2014-05-13 NOTE — ED Notes (Signed)
Pt presents to ED via [facility], pt reports mid-sternum chest pain radiating to Left chest, SOB, and nausea. Pt reports sleeping on 2 pillows at night for comfort. Pt receives Milrinone 20 mg via PICC line.

## 2014-05-13 NOTE — ED Notes (Signed)
IV team paged for assistance with PICC line

## 2014-05-13 NOTE — ED Provider Notes (Signed)
CSN: [id number]     Arrival date & time 05/13/14  2337 History   First MD Initiated Contact with Patient 05/13/14 2337     No chief complaint on file.    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 He is BIB EMS from his home. He has had 3 days of intermittent centrally located chest pain which radiates to the left arm. He has tried to get some pain medication called in from his Cardiologist's office but, was told to come to the ED should he need pain medications.   Pt developed worsening pain tonight along with SOB. The patient reports compliance with all meds. Denies any med changes. No fever. Reports compliance with low sodium diet.   EHR shows that the patient had no signficant coronary artery disease on cardiac cath performed within the past year.   Patient describes his pain as aching, severe and constant. Nothing makes it worse or better.   Past Medical History  Diagnosis Date  . Chronic systolic CHF (congestive heart failure)     a. on home milrinone - dose titrate to 0.385mcg/kg/min 03/2014.  . Diabetes   . HTN (hypertension)   . Morbid obesity with BMI of 50.0-59.9, adult   . Sleep apnea     on Bi Pap  . Dyslipidemia   . CKD (chronic kidney disease), stage III   . Asthma 01/03/2014  . DKA (diabetic ketoacidoses) 02/25/2014  . Hypokalemia 01/04/2014  . Hyponatremia 02/25/2014  . Microcytic anemia 01/04/2014  . 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 Normal coronary arteries- last cath Oct 2014 at [facility] 01/03/2014  . History of chicken pox   . Perirectal abscess 03/2014  . Allergic rhinitis, cause unspecified 04/29/2014  . Shortness of breath   . Anxiety    Past Surgical History  Procedure Laterality Date  . Incision and drainage abscess N/A 03/31/2014    Procedure: INCISION AND DRAINAGE  PERI-RECTAL ABSCESS;  Surgeon: Axel Filler,  MD;  Location: [facility] OR;  Service: General;  Laterality: N/A;   Family History  Problem Relation Age of Onset  . Hypertension Mother   . Diabetes Maternal Grandmother   . Liver disease Father    History  Substance Use Topics  . Smoking status: Never Smoker   . Smokeless tobacco: Never Used  . Alcohol Use: No    Review of Systems Ten point review of symptoms performed and is negative with the exception of symptoms noted above.     Allergies  Iodine and Shellfish allergy  Home Medications   Prior to Admission medications   Medication Sig Start Date End Date Taking? Authorizing Provider  albuterol (PROVENTIL HFA;VENTOLIN HFA) 108 (90 BASE) MCG/ACT inhaler Inhale 2 puffs into the lungs daily as needed for wheezing or shortness of breath. 01/08/14   Wilburt Finlay, PA-C  aspirin EC 81 MG tablet Take 81 mg by mouth daily.    Historical Provider, MD  atorvastatin (LIPITOR) 80 MG tablet Take 80 mg by mouth daily. 01/08/14   Wilburt Finlay, PA-C  bumetanide (BUMEX) 2 MG tablet Take 2 tablets (4 mg total) by mouth 2 (two) times daily. 01/16/14   Amy D Filbert Schilder, NP  carvedilol (COREG) 3.125 MG tablet Take 1 tablet (3.125 mg total) by mouth 2 (two) times daily. 02/20/14   Dolores Patty, MD  clonazePAM (KLONOPIN) 0.5 MG tablet Take 1 tablet (0.5 mg total) by mouth 2 (two) times daily as needed  for anxiety. 04/27/14   Corwin Levins, MD  digoxin (LANOXIN) 0.125 MG tablet Take 1 tablet (0.125 mg total) by mouth daily. 01/16/14   Amy D Clegg, NP  glucose blood (ACCU-CHEK AVIVA PLUS) test strip Use as directed twice daily to check blood sugar.  Diagnosis code 250.00 04/27/14   Corwin Levins, MD  hydrALAZINE (APRESOLINE) 25 MG tablet Take 1 tablet (25 mg total) by mouth every 8 (eight) hours. 01/23/14   Amy D Clegg, NP  insulin aspart (NOVOLOG FLEXPEN) 100 UNIT/ML FlexPen Inject 1-15 Units into the skin 3 (three) times daily with meals. Sliding scale 04/27/14   Corwin Levins, MD  insulin glargine (LANTUS) 100 UNIT/ML  injection Inject 0.25 mLs (25 Units total) into the skin at bedtime. 04/27/14   Corwin Levins, MD  isosorbide mononitrate (IMDUR) 30 MG 24 hr tablet Take 1 tablet (30 mg total) by mouth daily. 01/16/14   Amy D Filbert Schilder, NP  loratadine (CLARITIN) 10 MG tablet Take 1 tablet (10 mg total) by mouth daily. 04/27/14   Corwin Levins, MD  magnesium oxide (MAG-OX) 400 MG tablet Take 1 tablet (400 mg total) by mouth 2 (two) times daily. 03/07/14   Aundria Rud, NP  milrinone [facility]) 20 MG/100ML SOLN infusion Inject 62.7375 mcg/min into the vein continuous. 04/15/14   Ok Anis, NP  potassium chloride SA (K-DUR,KLOR-CON) 20 MEQ tablet Take 2 tablets (40 mEq total) by mouth 2 (two) times daily. 04/15/14   Ok Anis, NP  traMADol (ULTRAM) 50 MG tablet Take 2 tablets (100 mg total) by mouth every 6 (six) hours as needed for moderate pain. 04/15/14   Ok Anis, NP   There were no vitals taken for this visit. Physical Exam Gen: well developed and well nourished appearing, obese, appears uncomfortable, repeated requests for pain medication Head: NCAT Eyes: PERL, EOMI Nose: no epistaixis or rhinorrhea Mouth/throat: mucosa is moist and pink Neck: supple, no stridor Chest wall: gynecomastia Lungs: CTA B, no wheezing, rhonchi or rales CV: RRR, no murmur, extremities appear well perfused.  Abd: soft, notender, nondistended morbidly obese Back: normal to inspection Skin: warm and dry Ext: normal to inspection,  PICC line in right UE, brawny edema of the lower legs bilaterally Neuro: CN ii-xii grossly intact, no focal deficits Psyche; anxious affect,  ooperative.  ED Course  Procedures (including critical care time) Labs Review  Results for orders placed during the hospital encounter of 05/13/14 (from the past 24 hour(s))  CBG MONITORING, ED     Status: Abnormal   Collection Time    05/13/14 11:48 PM      Result Value Ref Range   Glucose-Capillary 193 (*) 70 - 99 mg/dL  CBC WITH  DIFFERENTIAL     Status: Abnormal   Collection Time    05/14/14 12:10 AM      Result Value Ref Range   WBC 4.9  4.0 - 10.5 K/uL   RBC 3.18 (*) 4.22 - 5.81 MIL/uL   Hemoglobin 7.9 (*) 13.0 - 17.0 g/dL   HCT 40.9 (*) 81.1 - 91.4 %   MCV 78.9  78.0 - 100.0 fL   MCH 24.8 (*) 26.0 - 34.0 pg   MCHC 31.5  30.0 - 36.0 g/dL   RDW 78.2 (*) 95.6 - 21.3 %   Platelets 366  150 - 400 K/uL   Neutrophils Relative % 80 (*) 43 - 77 %   Neutro Abs 3.9  1.7 - 7.7 K/uL  Lymphocytes Relative 13  12 - 46 %   Lymphs Abs 0.6 (*) 0.7 - 4.0 K/uL   Monocytes Relative 5  3 - 12 %   Monocytes Absolute 0.2  0.1 - 1.0 K/uL   Eosinophils Relative 2  0 - 5 %   Eosinophils Absolute 0.1  0.0 - 0.7 K/uL   Basophils Relative 0  0 - 1 %   Basophils Absolute 0.0  0.0 - 0.1 K/uL  PRO B NATRIURETIC PEPTIDE     Status: Abnormal   Collection Time    05/14/14 12:10 AM      Result Value Ref Range   Pro B Natriuretic peptide (BNP) 1806.0 (*) 0 - 125 pg/mL  COMPREHENSIVE METABOLIC PANEL     Status: Abnormal   Collection Time    05/14/14 12:10 AM      Result Value Ref Range   Sodium 133 (*) 137 - 147 mEq/L   Potassium 2.8 (*) 3.7 - 5.3 mEq/L   Chloride 90 (*) 96 - 112 mEq/L   CO2 27  19 - 32 mEq/L   Glucose, Bld 157 (*) 70 - 99 mg/dL   BUN 30 (*) 6 - 23 mg/dL   Creatinine, Ser 1.611.53 (*) 0.50 - 1.35 mg/dL   Calcium 8.8  8.4 - 09.610.5 mg/dL   Total Protein 8.1  6.0 - 8.3 g/dL   Albumin 3.6  3.5 - 5.2 g/dL   AST 42 (*) 0 - 37 U/L   ALT 42  0 - 53 U/L   Alkaline Phosphatase 91  39 - 117 U/L   Total Bilirubin 2.7 (*) 0.3 - 1.2 mg/dL   GFR calc non Af Amer 57 (*) >90 mL/min   GFR calc Af Amer 67 (*) >90 mL/min  I-STAT TROPOININ, ED     Status: None   Collection Time    05/14/14 12:37 AM      Result Value Ref Range   Troponin i, poc 0.03  0.00 - 0.08 ng/mL   Comment 3           I-STAT CHEM 8, ED     Status: Abnormal   Collection Time    05/14/14 12:39 AM      Result Value Ref Range   Sodium 136 (*) 137 - 147 mEq/L    Potassium 2.6 (*) 3.7 - 5.3 mEq/L   Chloride 92 (*) 96 - 112 mEq/L   BUN 28 (*) 6 - 23 mg/dL   Creatinine, Ser 0.451.60 (*) 0.50 - 1.35 mg/dL   Glucose, Bld 409160 (*) 70 - 99 mg/dL   Calcium, Ion 8.111.05 (*) 1.12 - 1.23 mmol/L   TCO2 29  0 - 100 mmol/L   Hemoglobin 10.5 (*) 13.0 - 17.0 g/dL   HCT 91.431.0 (*) 78.239.0 - 95.652.0 %   Comment NOTIFIED PHYSICIAN     EKG: nsr, no acute ischemic changes, normal intervals, normal axis, normal qrs complex  DG Chest Port 1 View (Final result)  Result time: 05/14/14 00:46:44    Final result by Rad Results In Interface (05/14/14 00:46:44)    Narrative:   CLINICAL DATA: Chest pain and shortness of breath.  EXAM: PORTABLE CHEST - 1 VIEW  COMPARISON: 04/29/2014 and 01/03/2014  FINDINGS: External defibrillator in place. PICC tip is in the superior vena cava at the level of the carina. There is chronic cardiomegaly. Pulmonary vascularity is normal. No infiltrates or effusions. No acute osseous abnormality.  IMPRESSION: No acute abnormalities. Chronic cardiomegaly.   Electronically Signed By:  Geanie Cooley M.D. On: 05/14/2014 00:46          MDM   Patient with atypical chest pain and SOB. Normotensive on milrinone infusion. Noted to be hypokalemic on labs. BNP mildly elevated but no signficant pulmonary edema, pleural effusion or pulmonary infiltrate visible on CXR.  Dr. Donnetta Simpers, Cardiology, was consulted and does not believe that the patient requires an ACS work up. He agrees with plan to treat the patient's chest pain and hypokalemia and re-evaluate for disposition.   7989:  Troponin negative. No acute findings on EKG or CXR. Labs notable for CKD - unchanged from baseline and hypokalemia. We are treating this with po and iv supplementation. BNP level is mildly elevated. But, no signs of pulmonary edema on CXR.  The patient is feeling better and is pain free. He able to maintain adequate 02 sats without supplemental oxygen. Dr. Donnetta Simpers, of Cardiology,  reviewed the patient's case and we concur that there does not appear to be an indication for admission.   The patient's main concern seems to be discharge with a prescription pain medication. He is stable for discharge with counsel to see his PCP on Monday for a recheck and to also f/u with his cardiologist in 2 days.     Brandt Loosen, MD 05/14/14 [phone]

## 2014-05-13 NOTE — ED Notes (Addendum)
MD with Cardiology at bedside.

## 2014-05-14 DIAGNOSIS — I447 Left bundle-branch block, unspecified: Secondary | ICD-10-CM

## 2014-05-14 DIAGNOSIS — R079 Chest pain, unspecified: Secondary | ICD-10-CM

## 2014-05-14 DIAGNOSIS — I509 Heart failure, unspecified: Secondary | ICD-10-CM

## 2014-05-14 LAB — CBC WITH DIFFERENTIAL/PLATELET
BASOS ABS: 0 10*3/uL (ref 0.0–0.1)
BASOS PCT: 0 % (ref 0–1)
EOS PCT: 2 % (ref 0–5)
Eosinophils Absolute: 0.1 10*3/uL (ref 0.0–0.7)
HEMATOCRIT: 25.1 % — AB (ref 39.0–52.0)
HEMOGLOBIN: 7.9 g/dL — AB (ref 13.0–17.0)
LYMPHS ABS: 0.6 10*3/uL — AB (ref 0.7–4.0)
LYMPHS PCT: 13 % (ref 12–46)
MCH: 24.8 pg — ABNORMAL LOW (ref 26.0–34.0)
MCHC: 31.5 g/dL (ref 30.0–36.0)
MCV: 78.9 fL (ref 78.0–100.0)
MONO ABS: 0.2 10*3/uL (ref 0.1–1.0)
MONOS PCT: 5 % (ref 3–12)
NEUTROS ABS: 3.9 10*3/uL (ref 1.7–7.7)
Neutrophils Relative %: 80 % — ABNORMAL HIGH (ref 43–77)
Platelets: 366 10*3/uL (ref 150–400)
RBC: 3.18 MIL/uL — AB (ref 4.22–5.81)
RDW: 17.6 % — AB (ref 11.5–15.5)
WBC: 4.9 10*3/uL (ref 4.0–10.5)

## 2014-05-14 LAB — COMPREHENSIVE METABOLIC PANEL
ALBUMIN: 3.6 g/dL (ref 3.5–5.2)
ALT: 42 U/L (ref 0–53)
AST: 42 U/L — AB (ref 0–37)
Alkaline Phosphatase: 91 U/L (ref 39–117)
BUN: 30 mg/dL — ABNORMAL HIGH (ref 6–23)
CALCIUM: 8.8 mg/dL (ref 8.4–10.5)
CO2: 27 mEq/L (ref 19–32)
Chloride: 90 mEq/L — ABNORMAL LOW (ref 96–112)
Creatinine, Ser: 1.53 mg/dL — ABNORMAL HIGH (ref 0.50–1.35)
GFR calc non Af Amer: 57 mL/min — ABNORMAL LOW (ref >90)
GFR, EST AFRICAN AMERICAN: 67 mL/min — AB (ref >90)
Glucose, Bld: 157 mg/dL — ABNORMAL HIGH (ref 70–99)
Potassium: 2.8 mEq/L — CL (ref 3.7–5.3)
SODIUM: 133 meq/L — AB (ref 137–147)
Total Bilirubin: 2.7 mg/dL — ABNORMAL HIGH (ref 0.3–1.2)
Total Protein: 8.1 g/dL (ref 6.0–8.3)

## 2014-05-14 LAB — PRO B NATRIURETIC PEPTIDE: PRO B NATRI PEPTIDE: 1806 pg/mL — AB (ref 0–125)

## 2014-05-14 LAB — I-STAT CHEM 8, ED
BUN: 28 mg/dL — ABNORMAL HIGH (ref 6–23)
CALCIUM ION: 1.05 mmol/L — AB (ref 1.12–1.23)
CHLORIDE: 92 meq/L — AB (ref 96–112)
CREATININE: 1.6 mg/dL — AB (ref 0.50–1.35)
GLUCOSE: 160 mg/dL — AB (ref 70–99)
HEMATOCRIT: 31 % — AB (ref 39.0–52.0)
Hemoglobin: 10.5 g/dL — ABNORMAL LOW (ref 13.0–17.0)
Potassium: 2.6 mEq/L — CL (ref 3.7–5.3)
Sodium: 136 mEq/L — ABNORMAL LOW (ref 137–147)
TCO2: 29 mmol/L (ref 0–100)

## 2014-05-14 LAB — I-STAT TROPONIN, ED: Troponin i, poc: 0.03 ng/mL (ref 0.00–0.08)

## 2014-05-14 MED ORDER — MORPHINE SULFATE 4 MG/ML IJ SOLN
4.0000 mg | Freq: Once | INTRAMUSCULAR | Status: AC
  Administered 2014-05-14: 4 mg via INTRAVENOUS
  Filled 2014-05-14: qty 1

## 2014-05-14 MED ORDER — HEPARIN SOD (PORK) LOCK FLUSH 100 UNIT/ML IV SOLN
250.0000 [IU] | INTRAVENOUS | Status: AC | PRN
  Administered 2014-05-14: 250 [IU]

## 2014-05-14 MED ORDER — FUROSEMIDE 10 MG/ML IJ SOLN: 80.0000 mg | Freq: Once | INTRAMUSCULAR | Status: DC

## 2014-05-14 MED ORDER — POTASSIUM CHLORIDE CRYS ER 20 MEQ PO TBCR
40.0000 meq | EXTENDED_RELEASE_TABLET | Freq: Once | ORAL | Status: AC
  Administered 2014-05-14: 40 meq via ORAL
  Filled 2014-05-14: qty 2

## 2014-05-14 MED ORDER — SODIUM CHLORIDE 0.9 % IJ SOLN
10.0000 mL | INTRAMUSCULAR | Status: DC | PRN
  Administered 2014-05-14: 10 mL

## 2014-05-14 MED ORDER — OXYCODONE-ACETAMINOPHEN 5-325 MG PO TABS: 2.0000 | ORAL_TABLET | ORAL | Status: DC | PRN

## 2014-05-14 MED ORDER — POTASSIUM CHLORIDE CRYS ER 20 MEQ PO TBCR
20.0000 meq | EXTENDED_RELEASE_TABLET | Freq: Once | ORAL | Status: AC
  Administered 2014-05-14: 20 meq via ORAL
  Filled 2014-05-14: qty 1

## 2014-05-14 MED ORDER — POTASSIUM CHLORIDE 10 MEQ/100ML IV SOLN
10.0000 meq | Freq: Once | INTRAVENOUS | Status: AC
  Administered 2014-05-14: 10 meq via INTRAVENOUS
  Filled 2014-05-14: qty 100

## 2014-05-14 NOTE — Discharge Instructions (Signed)
Chest Pain (Nonspecific) °It is often hard to give a specific diagnosis for the cause of chest pain. There is always a chance that your pain could be related to something serious, such as a heart attack or a blood clot in the lungs. You need to follow up with your caregiver for further evaluation. °CAUSES  °· Heartburn. °· Pneumonia or bronchitis. °· Anxiety or stress. °· Inflammation around your heart (pericarditis) or lung (pleuritis or pleurisy). °· A blood clot in the lung. °· A collapsed lung (pneumothorax). It can develop suddenly on its own (spontaneous pneumothorax) or from injury (trauma) to the chest. °· Shingles infection (herpes zoster virus). °The chest wall is composed of bones, muscles, and cartilage. Any of these can be the source of the pain. °· The bones can be bruised by injury. °· The muscles or cartilage can be strained by coughing or overwork. °· The cartilage can be affected by inflammation and become sore (costochondritis). °DIAGNOSIS  °Lab tests or other studies, such as X-rays, electrocardiography, stress testing, or cardiac imaging, may be needed to find the cause of your pain.  °TREATMENT  °· Treatment depends on what may be causing your chest pain. Treatment may include: °· Acid blockers for heartburn. °· Anti-inflammatory medicine. °· Pain medicine for inflammatory conditions. °· Antibiotics if an infection is present. °· You may be advised to change lifestyle habits. This includes stopping smoking and avoiding alcohol, caffeine, and chocolate. °· You may be advised to keep your head raised (elevated) when sleeping. This reduces the chance of acid going backward from your stomach into your esophagus. °· Most of the time, nonspecific chest pain will improve within 2 to 3 days with rest and mild pain medicine. °HOME CARE INSTRUCTIONS  °· If antibiotics were prescribed, take your antibiotics as directed. Finish them even if you start to feel better. °· For the next few days, avoid physical  activities that bring on chest pain. Continue physical activities as directed. °· Do not smoke. °· Avoid drinking alcohol. °· Only take over-the-counter or prescription medicine for pain, discomfort, or fever as directed by your caregiver. °· Follow your caregiver's suggestions for further testing if your chest pain does not go away. °· Keep any follow-up appointments you made. If you do not go to an appointment, you could develop lasting (chronic) problems with pain. If there is any problem keeping an appointment, you must call to reschedule. °SEEK MEDICAL CARE IF:  °· You think you are having problems from the medicine you are taking. Read your medicine instructions carefully. °· Your chest pain does not go away, even after treatment. °· You develop a rash with blisters on your chest. °SEEK IMMEDIATE MEDICAL CARE IF:  °· You have increased chest pain or pain that spreads to your arm, neck, jaw, back, or abdomen. °· You develop shortness of breath, an increasing cough, or you are coughing up blood. °· You have severe back or abdominal pain, feel nauseous, or vomit. °· You develop severe weakness, fainting, or chills. °· You have a fever. °THIS IS AN EMERGENCY. Do not wait to see if the pain will go away. Get medical help at once. Call your local emergency services (911 in [place] Do not drive yourself to the hospital. °MAKE SURE YOU:  °· Understand these instructions. °· Will watch your condition. °· Will get help right away if you are not doing well or get worse. °Document Released: 09/17/2005 Document Revised: 03/01/2012 Document Reviewed: 07/13/2008 °[employer]® Patient Information ©2014 [employer]   [employer].  Cardiomyopathy Cardiomyopathy means a disease of the heart muscle. The heart muscle becomes enlarged or stiff. The heart is not able to pump enough blood or deliver enough oxygen to the body. This leads to heart failure and is the number one reason for heart transplants.  TYPES OF CARDIOMYOPATHY  INCLUDE: DILATED  The most common type. The heart muscle is stretched out and weak so there is less blood pumped out.   Some causes:  Disease of the arteries of the heart (ischemia).  Heart attack with muscle scar.  Leaky or damaged valves.  After a viral illness.  Smoking.  High cholesterol.  Diabetes or overactive thyroid.  Alcohol or drug abuse.  High blood pressure.  May be reversible. HYPERTROPHIC The heart muscle grows bigger so there is less room for blood in the ventricle, and not enough blood is pumped out.   Causes include:  Mitral valve leaks.  Inherited tendency (from your family).  No explanation (idiopathic).  May be a cause of sudden death in young athletes with no symptoms. RESTRICTIVE The heart muscle becomes stiff, but not always larger. The heart has to work harder and will get weaker. Abnormal heart beats or rhythm (arrhythmia) are common.  Some causes:  Diseases in other parts of the body which may produce abnormal deposits in the heart muscle.  Probably not inherited.  A result of radiation treatment for cancer. SYMPTOMS OF ALL TYPES:  Less able to exercise or tolerate physical activity.  Palpitations.  Irregular heart beat, heart arrhythmias.  Shortness of breath, even at rest.  Chest pain.  Lightheadedness or fainting. TREATMENT  Life-style changes including reducing salt, lowering cholesterol, stop smoking.  Manage contributing causes with medications.  Medicines to help reduce the fluids in the body.  An implanted cardioverter defibrillator (ICD) to improve heart function and correct arrhythmias.  Medications to relax the blood vessels and make it easier for the heart to pump.  Drugs that help regulate heart beat and improve heart relaxation, reducing the work of the heart.  Myomectomy for patients with hypertrophic cardiomyopathy and severe problems. This is a surgical procedure that removes a portion of the  thickened muscle wall in order to improve heart output and provide symptom relief.  A heart transplant is an option in carefully applied circumstances. SEEK IMMEDIATE MEDICAL CARE IF:   You have severe chest pain, especially if the pain is crushing or pressure-like and spreads to the arms, back, neck, or jaw, or if you have sweating, feeling sick to your stomach (nausea), or shortness of breath. THIS IS AN EMERGENCY. Do not wait to see if the pain will go away. Get medical help at once. Call your local emergency services (911 in [place] DO NOT drive yourself to the hospital.  You develop severe shortness of breath.  You begin to cough up bloody sputum.  You are unable to sleep because you cannot breathe.  You gain weight due to fluid retention.  You develop painful swelling in your calf or leg.  You feel your heart racing and it does not go away or happens when you are resting. Document Released: 02/20/2005 Document Revised: 03/01/2012 Document Reviewed: 07/26/2008 [facility] Patient Information 2014 [place], [place].

## 2014-05-14 NOTE — ED Notes (Addendum)
Orders for Lasix were not given due to pt was discharged prior to receiving order for med pt had orders for discharge 30 mins prior to order and IV picc was discontinued. Dr Lavella Lemons aware.

## 2014-05-14 NOTE — ED Notes (Signed)
Patient denies pain and is resting comfortably.  

## 2014-05-14 NOTE — ED Notes (Signed)
Awaiting iv team to deaccess port on picc line. Then will discharge.

## 2014-05-14 NOTE — Consult Note (Signed)
Patient ID: Steven CarotaWayne O Rotert Jr., MRN: [id number], DOB/AGE: [birth year] 37 y.o. Admit date: 05/13/2014   Date of Consult: 05/14/2014 Primary Physician: [name], MD Primary Cardiologist: Steven Wyatt   Chief Complaint: Chest pain  Reason for Consult: concern for STEMI  HPI: 38 yr old AAM with a history of NICM (nor cors Oct 2014), chronic systolic HF ECHO 4/09811/2015 EF 20% on chronic home milrinone via PICC, morbid obesity, OSA on BIPAP, HTN, CKD uncontrolled DM, and HLD. He has been followed closely in HF clinic chronic home Milrinone 0.375 mcg , recent d/c from hospital on 05/01/2014  Pt states that for the past 3 days he has been having chest pain  Described pounding sensation in the center of his chest radiating to his arm , neck and head that is constant and relieved with pain medication . Associated with this he has SOB , Nausea and dizziness. He has chronic 2 pillow orthopnea, denies any recent weight gain ( discharge weight was 347 lbs and states that at home he has been around 330 lbs) .reports compliance with his medication . Continues to take his Milrinone gtt and Bipap therapy as well as life vest as instructed Per chart review pt has not been considered for LVAD , CRT or advanced HF therapy due to non compliance .    Past Medical History  Diagnosis Date  . Chronic systolic CHF (congestive heart failure)     a. on home milrinone - dose titrate to 0.35575mcg/kg/min 03/2014.  . Diabetes   . HTN (hypertension)   . Morbid obesity with BMI of 50.0-59.9, adult   . Sleep apnea     on Bi Pap  . Dyslipidemia   . CKD (chronic kidney disease), stage III   . Asthma 01/03/2014  . DKA (diabetic ketoacidoses) 02/25/2014  . Hypokalemia 01/04/2014  . Hyponatremia 02/25/2014  . Microcytic anemia 01/04/2014  . 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 [facility]- [facility]2014  . History of chicken pox   . Perirectal abscess 03/2014  . Allergic rhinitis,  cause unspecified 04/29/2014  . Shortness of breath   . Anxiety       Most Recent Cardiac Studies: See EKG below    Echo 12/2013  - Left ventricle: The cavity size was moderately dilated. Wall thickness was normal. The estimated ejection fraction was 20%. Diffuse hypokinesis. Features are consistent with a pseudonormal left ventricular filling pattern, with concomitant abnormal relaxation and increased filling pressure (grade 2 diastolic dysfunction). - Aortic valve: There was no stenosis. - Mitral valve: Mildly calcified annulus. Mildly calcified leaflets . Mild regurgitation. - Left atrium: The atrium was moderately to severely dilated. - Right ventricle: The cavity size was moderately dilated. Systolic function was moderately reduced. - Tricuspid valve: Peak RV-RA gradient: 39mm Hg (S). - Pulmonary arteries: PA peak pressure: 59mm Hg (S). - Systemic veins: IVC measured 3.2 cm with minimal respirophasic variation, suggesting RA pressure 20 mmHg. Impressions:    Surgical History:  Past Surgical History  Procedure Laterality Date  . Incision and drainage abscess N/A 03/31/2014    Procedure: INCISION AND DRAINAGE  PERI-RECTAL ABSCESS;  Surgeon: Axel FillerArmando Ramirez, MD;  Location: [facility] OR;  Service: General;  Laterality: N/A;     Home Meds: Prior to Admission medications   Medication Sig Start Date End Date Taking? Authorizing Provider  albuterol (PROVENTIL HFA;VENTOLIN HFA) 108 (90 BASE) MCG/ACT inhaler Inhale 2 puffs into the lungs daily as needed for wheezing  or shortness of breath. 01/08/14   Wilburt Finlay, PA-C  aspirin EC 81 MG tablet Take 81 mg by mouth daily.    Historical Provider, MD  atorvastatin (LIPITOR) 80 MG tablet Take 80 mg by mouth daily. 01/08/14   Wilburt Finlay, PA-C  bumetanide (BUMEX) 2 MG tablet Take 2 tablets (4 mg total) by mouth 2 (two) times daily. 01/16/14   Amy D Filbert Schilder, NP  carvedilol (COREG) 3.125 MG tablet Take 1 tablet (3.125 mg total) by mouth 2 (two) times  daily. 02/20/14   Dolores Patty, MD  clonazePAM (KLONOPIN) 0.5 MG tablet Take 1 tablet (0.5 mg total) by mouth 2 (two) times daily as needed for anxiety. 04/27/14   Corwin Levins, MD  digoxin (LANOXIN) 0.125 MG tablet Take 1 tablet (0.125 mg total) by mouth daily. 01/16/14   Amy D Clegg, NP  glucose blood (ACCU-CHEK AVIVA PLUS) test strip Use as directed twice daily to check blood sugar.  Diagnosis code 250.00 04/27/14   Corwin Levins, MD  hydrALAZINE (APRESOLINE) 25 MG tablet Take 1 tablet (25 mg total) by mouth every 8 (eight) hours. 01/23/14   Amy D Clegg, NP  insulin aspart (NOVOLOG FLEXPEN) 100 UNIT/ML FlexPen Inject 1-15 Units into the skin 3 (three) times daily with meals. Sliding scale 04/27/14   Corwin Levins, MD  insulin glargine (LANTUS) 100 UNIT/ML injection Inject 0.25 mLs (25 Units total) into the skin at bedtime. 04/27/14   Corwin Levins, MD  isosorbide mononitrate (IMDUR) 30 MG 24 hr tablet Take 1 tablet (30 mg total) by mouth daily. 01/16/14   Amy D Filbert Schilder, NP  loratadine (CLARITIN) 10 MG tablet Take 1 tablet (10 mg total) by mouth daily. 04/27/14   Corwin Levins, MD  magnesium oxide (MAG-OX) 400 MG tablet Take 1 tablet (400 mg total) by mouth 2 (two) times daily. 03/07/14   Aundria Rud, NP  milrinone [facility]) 20 MG/100ML SOLN infusion Inject 62.7375 mcg/min into the vein continuous. 04/15/14   Ok Anis, NP  potassium chloride SA (K-DUR,KLOR-CON) 20 MEQ tablet Take 2 tablets (40 mEq total) by mouth 2 (two) times daily. 04/15/14   Ok Anis, NP  traMADol (ULTRAM) 50 MG tablet Take 2 tablets (100 mg total) by mouth every 6 (six) hours as needed for moderate pain. 04/15/14   Ok Anis, NP    Inpatient Medications:     Allergies:  Allergies  Allergen Reactions  . Iodine Anaphylaxis  . Shellfish Allergy Anaphylaxis    History   Social History  . Marital Status: Married    Spouse Name: N/A    Number of Children: N/A  . Years of Education: N/A   Occupational  History  . Clinical Social Worker    Social History Main Topics  . Smoking status: Never Smoker   . Smokeless tobacco: Never Used  . Alcohol Use: No  . Drug Use: No  . Sexual Activity: Yes   Other Topics Concern  . Not on file   Social History Narrative   Lives with wife and 2/5 children.     Family History  Problem Relation Age of Onset  . Hypertension Mother   . Diabetes Maternal Grandmother   . Liver disease Father      Review of Systems General: negative for chills, fever, night sweats or weight changes.  Cardiovascular: negative for chest pain, edema, orthopnea, palpitations, paroxysmal nocturnal dyspnea, shortness of breath or dyspnea on exertion Dermatological: negative for rash Respiratory:  negative for cough or wheezing Urologic: negative for hematuria Abdominal: negative for nausea, vomiting, diarrhea, bright red blood per rectum, melena, or hematemesis Neurologic: negative for visual changes, syncope, or dizziness All other systems reviewed and are otherwise negative except as noted above.  Labs: No results found for this basename: CKTOTAL, CKMB, TROPONINI,  in the last 72 hours Lab Results  Component Value Date   WBC 5.1 04/30/2014   HGB 10.5* 05/14/2014   HCT 31.0* 05/14/2014   MCV 81.2 04/30/2014   PLT 334 04/30/2014    Recent Labs Lab 05/14/14 0039  NA 136*  K 2.6*  CL 92*  BUN 28*  CREATININE 1.60*  GLUCOSE 160*   No results found for this basename: CHOL, HDL, LDLCALC, TRIG   Lab Results  Component Value Date   DDIMER 1.85* 04/10/2014    Radiology/Studies:  Dg Chest 2 View  04/22/2014   CLINICAL DATA:  Short of breath.  Chest pain.  EXAM: CHEST  2 VIEW  COMPARISON:  03/2014  FINDINGS: Cardiac silhouette is mildly enlarged. Normal mediastinal and hilar contours.  Hazy airspace opacity is seen, most prominent centrally. No pleural effusion. No pneumothorax.  Bony thorax is intact. Right PICC has its tip in the mid superior vena cava.  IMPRESSION:  Hazy central lung opacities suggest mild pulmonary edema, but could reflect bilateral infectious or inflammatory infiltrates. Appearance is similar to the most recent study.   Electronically Signed   By: Amie Portland M.D.   On: 04/22/2014 13:41   Dg Chest Port 1 View  05/14/2014   CLINICAL DATA:  Chest pain and shortness of breath.  EXAM: PORTABLE CHEST - 1 VIEW  COMPARISON:  04/29/2014 and 01/03/2014  FINDINGS: External defibrillator in place. PICC tip is in the superior vena cava at the level of the carina. There is chronic cardiomegaly. Pulmonary vascularity is normal. No infiltrates or effusions. No acute osseous abnormality.  IMPRESSION: No acute abnormalities.  Chronic cardiomegaly.   Electronically Signed   By: Geanie Cooley M.D.   On: 05/14/2014 00:46   Dg Chest Port 1 View  04/29/2014   CLINICAL DATA:  Shortness of breath  EXAM: PORTABLE CHEST - 1 VIEW  COMPARISON:  04/25/2014  FINDINGS: Unchanged cardiopericardial enlargement. Unremarkable upper mediastinal contours. Cephalized pulmonary blood flow. No consolidation, effusion, or pneumothorax. Stable positioning of right upper extremity PICC, tip at the upper SVC.  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   Electronically Signed   By: Tiburcio Pea M.D.   On: 04/29/2014 23:17   Dg Chest Port 1 View  04/25/2014   CLINICAL DATA:  Shortness of breath with chest pain.  EXAM: PORTABLE CHEST - 1 VIEW  COMPARISON:  04/22/2014.  FINDINGS: Moderate enlargement cardiac silhouette, likely cardiomegaly. Worsening bilateral pulmonary opacities, likely pulmonary edema. No effusion or pneumothorax. PICC line tip remains at the proximal SVC. Increased body habitus.  IMPRESSION: Worsening aeration. Developing pulmonary edema. Moderate cardiac silhouette enlargement.   Electronically Signed   By: Davonna Belling M.D.   On: 04/25/2014 14:30    EKG: NSR, L:BBB, PVC   Physical Exam: Blood pressure 110/82, pulse 113, temperature  97.7 F (36.5 C), temperature source Oral, resp. rate 26, SpO2 97.00%. General: obese, mild respiratory distress  Head: Normocephalic, atraumatic, sclera non-icteric, no xanthomas, nares are without discharge.  Neck: Negative for carotid bruits. Could not estimated JVP  Lungs: Clear bilaterally to auscultation without wheezes, rales, or rhonchi. Breathing is mildly  labored. Heart: RRR with S1 S2.  No murmurs, rubs, or gallops appreciated. Abdomen: Soft, non-tender, non-distended with normoactive bowel sounds. No hepatomegaly. No rebound/guarding. No obvious abdominal masses. Msk:  Strength and tone appear normal for age. Extremities: No clubbing or cyanosis  Chronic indurated skin changes  Neuro: Alert and oriented X 3. No facial asymmetry. No focal deficit. Moves all extremities spontaneously. Psych:  Responds to questions appropriately with a normal affect.     Assessment and Plan:  Chest pain - atypical , with chronic LBBB , also recent LHC normal cors  CHF , chronic - Hypokalemia  OSA  HTN   Plan   -Continue home heart failure meds including milrinone , imdur , hydralazine, coreg, buemetanide  -will need to replete K ,  -no further cardiac workup for chest pain at this time     Signed, Crissie Sickles , M. D  05/14/2014, 12:51 AM

## 2014-05-15 ENCOUNTER — Inpatient Hospital Stay (HOSPITAL_COMMUNITY)
Admission: EM | Admit: 2014-05-15 | Discharge: 2014-05-18 | DRG: 308 | Disposition: A | Discharge status: Home / Self Care | Payer: Medicare Other | Attending: Cardiovascular Disease | Admitting: Cardiovascular Disease

## 2014-05-15 ENCOUNTER — Telehealth: Payer: Self-pay | Admitting: Cardiology

## 2014-05-15 ENCOUNTER — Emergency Department (HOSPITAL_COMMUNITY): Payer: Medicare Other

## 2014-05-15 ENCOUNTER — Encounter (HOSPITAL_COMMUNITY): Payer: Self-pay | Admitting: Emergency Medicine

## 2014-05-15 DIAGNOSIS — I428 Other cardiomyopathies: Secondary | ICD-10-CM

## 2014-05-15 DIAGNOSIS — N183 Chronic kidney disease, stage 3 unspecified: Secondary | ICD-10-CM

## 2014-05-15 DIAGNOSIS — R0602 Shortness of breath: Secondary | ICD-10-CM | POA: Diagnosis present

## 2014-05-15 DIAGNOSIS — Z6841 Body Mass Index (BMI) 40.0 and over, adult: Secondary | ICD-10-CM

## 2014-05-15 DIAGNOSIS — E876 Hypokalemia: Secondary | ICD-10-CM | POA: Diagnosis present

## 2014-05-15 DIAGNOSIS — I447 Left bundle-branch block, unspecified: Secondary | ICD-10-CM

## 2014-05-15 DIAGNOSIS — Z91199 Patient's noncompliance with other medical treatment and regimen due to unspecified reason: Secondary | ICD-10-CM

## 2014-05-15 DIAGNOSIS — G4733 Obstructive sleep apnea (adult) (pediatric): Secondary | ICD-10-CM | POA: Diagnosis present

## 2014-05-15 DIAGNOSIS — N179 Acute kidney failure, unspecified: Secondary | ICD-10-CM

## 2014-05-15 DIAGNOSIS — G4730 Sleep apnea, unspecified: Secondary | ICD-10-CM

## 2014-05-15 DIAGNOSIS — D649 Anemia, unspecified: Secondary | ICD-10-CM

## 2014-05-15 DIAGNOSIS — F419 Anxiety disorder, unspecified: Secondary | ICD-10-CM

## 2014-05-15 DIAGNOSIS — E119 Type 2 diabetes mellitus without complications: Secondary | ICD-10-CM

## 2014-05-15 DIAGNOSIS — I5043 Acute on chronic combined systolic (congestive) and diastolic (congestive) heart failure: Secondary | ICD-10-CM

## 2014-05-15 DIAGNOSIS — I493 Ventricular premature depolarization: Secondary | ICD-10-CM

## 2014-05-15 DIAGNOSIS — G8929 Other chronic pain: Secondary | ICD-10-CM | POA: Diagnosis present

## 2014-05-15 DIAGNOSIS — I5022 Chronic systolic (congestive) heart failure: Secondary | ICD-10-CM

## 2014-05-15 DIAGNOSIS — N189 Chronic kidney disease, unspecified: Secondary | ICD-10-CM

## 2014-05-15 DIAGNOSIS — Z794 Long term (current) use of insulin: Secondary | ICD-10-CM | POA: Diagnosis not present

## 2014-05-15 DIAGNOSIS — Z7982 Long term (current) use of aspirin: Secondary | ICD-10-CM

## 2014-05-15 DIAGNOSIS — F411 Generalized anxiety disorder: Secondary | ICD-10-CM

## 2014-05-15 DIAGNOSIS — Z0389 Encounter for observation for other suspected diseases and conditions ruled out: Secondary | ICD-10-CM

## 2014-05-15 DIAGNOSIS — I5023 Acute on chronic systolic (congestive) heart failure: Secondary | ICD-10-CM | POA: Diagnosis present

## 2014-05-15 DIAGNOSIS — I509 Heart failure, unspecified: Secondary | ICD-10-CM | POA: Diagnosis present

## 2014-05-15 DIAGNOSIS — Z8249 Family history of ischemic heart disease and other diseases of the circulatory system: Secondary | ICD-10-CM

## 2014-05-15 DIAGNOSIS — I129 Hypertensive chronic kidney disease with stage 1 through stage 4 chronic kidney disease, or unspecified chronic kidney disease: Secondary | ICD-10-CM | POA: Diagnosis present

## 2014-05-15 DIAGNOSIS — I10 Essential (primary) hypertension: Secondary | ICD-10-CM

## 2014-05-15 DIAGNOSIS — I472 Ventricular tachycardia, unspecified: Secondary | ICD-10-CM

## 2014-05-15 DIAGNOSIS — I4949 Other premature depolarization: Secondary | ICD-10-CM | POA: Diagnosis present

## 2014-05-15 DIAGNOSIS — R072 Precordial pain: Secondary | ICD-10-CM | POA: Diagnosis present

## 2014-05-15 DIAGNOSIS — E785 Hyperlipidemia, unspecified: Secondary | ICD-10-CM | POA: Diagnosis present

## 2014-05-15 DIAGNOSIS — I4729 Other ventricular tachycardia: Secondary | ICD-10-CM

## 2014-05-15 DIAGNOSIS — Z9119 Patient's noncompliance with other medical treatment and regimen: Secondary | ICD-10-CM

## 2014-05-15 DIAGNOSIS — R079 Chest pain, unspecified: Secondary | ICD-10-CM

## 2014-05-15 DIAGNOSIS — IMO0001 Reserved for inherently not codable concepts without codable children: Secondary | ICD-10-CM

## 2014-05-15 HISTORY — DX: Unspecified chronic bronchitis: J42

## 2014-05-15 LAB — CBC WITH DIFFERENTIAL/PLATELET
BASOS PCT: 1 % (ref 0–1)
Basophils Absolute: 0 10*3/uL (ref 0.0–0.1)
Eosinophils Absolute: 0.2 10*3/uL (ref 0.0–0.7)
Eosinophils Relative: 3 % (ref 0–5)
HEMATOCRIT: 26.6 % — AB (ref 39.0–52.0)
HEMOGLOBIN: 8.2 g/dL — AB (ref 13.0–17.0)
Lymphocytes Relative: 10 % — ABNORMAL LOW (ref 12–46)
Lymphs Abs: 0.7 10*3/uL (ref 0.7–4.0)
MCH: 24.5 pg — AB (ref 26.0–34.0)
MCHC: 30.8 g/dL (ref 30.0–36.0)
MCV: 79.4 fL (ref 78.0–100.0)
Monocytes Absolute: 0.3 10*3/uL (ref 0.1–1.0)
Monocytes Relative: 5 % (ref 3–12)
NEUTROS ABS: 5.2 10*3/uL (ref 1.7–7.7)
Neutrophils Relative %: 81 % — ABNORMAL HIGH (ref 43–77)
Platelets: 404 10*3/uL — ABNORMAL HIGH (ref 150–400)
RBC: 3.35 MIL/uL — ABNORMAL LOW (ref 4.22–5.81)
RDW: 17.4 % — ABNORMAL HIGH (ref 11.5–15.5)
WBC: 6.4 10*3/uL (ref 4.0–10.5)

## 2014-05-15 LAB — I-STAT TROPONIN, ED: TROPONIN I, POC: 0.04 ng/mL (ref 0.00–0.08)

## 2014-05-15 LAB — COMPREHENSIVE METABOLIC PANEL
ALBUMIN: 3.6 g/dL (ref 3.5–5.2)
ALT: 34 U/L (ref 0–53)
AST: 31 U/L (ref 0–37)
Alkaline Phosphatase: 80 U/L (ref 39–117)
BUN: 29 mg/dL — AB (ref 6–23)
CHLORIDE: 93 meq/L — AB (ref 96–112)
CO2: 28 mEq/L (ref 19–32)
Calcium: 9.1 mg/dL (ref 8.4–10.5)
Creatinine, Ser: 1.64 mg/dL — ABNORMAL HIGH (ref 0.50–1.35)
GFR calc Af Amer: 61 mL/min — ABNORMAL LOW (ref >90)
GFR, EST NON AFRICAN AMERICAN: 53 mL/min — AB (ref >90)
Glucose, Bld: 128 mg/dL — ABNORMAL HIGH (ref 70–99)
Potassium: 3 mEq/L — ABNORMAL LOW (ref 3.7–5.3)
Sodium: 138 mEq/L (ref 137–147)
Total Bilirubin: 2.7 mg/dL — ABNORMAL HIGH (ref 0.3–1.2)
Total Protein: 7.8 g/dL (ref 6.0–8.3)

## 2014-05-15 LAB — GLUCOSE, CAPILLARY
GLUCOSE-CAPILLARY: 141 mg/dL — AB (ref 70–99)
Glucose-Capillary: 124 mg/dL — ABNORMAL HIGH (ref 70–99)

## 2014-05-15 LAB — DIGOXIN LEVEL: Digoxin Level: 0.6 ng/mL — ABNORMAL LOW (ref 0.8–2.0)

## 2014-05-15 LAB — TROPONIN I: Troponin I: <0.3 ng/mL (ref <0.30)

## 2014-05-15 LAB — ABO/RH: ABO/RH(D): O POS

## 2014-05-15 LAB — TSH: TSH: 2.51 u[IU]/mL (ref 0.350–4.500)

## 2014-05-15 LAB — PRO B NATRIURETIC PEPTIDE: Pro B Natriuretic peptide (BNP): 2056 pg/mL — ABNORMAL HIGH (ref 0–125)

## 2014-05-15 LAB — PREPARE RBC (CROSSMATCH)

## 2014-05-15 MED ORDER — SODIUM CHLORIDE 0.9 % IV SOLN: 250.0000 mL | INTRAVENOUS | Status: DC | PRN

## 2014-05-15 MED ORDER — HYDROMORPHONE HCL PF 1 MG/ML IJ SOLN
1.0000 mg | Freq: Once | INTRAMUSCULAR | Status: AC
  Administered 2014-05-15: 1 mg via INTRAVENOUS
  Filled 2014-05-15: qty 1

## 2014-05-15 MED ORDER — MAGNESIUM OXIDE 400 (241.3 MG) MG PO TABS
400.0000 mg | ORAL_TABLET | Freq: Two times a day (BID) | ORAL | Status: DC
  Administered 2014-05-15 – 2014-05-18 (×7): 400 mg via ORAL
  Filled 2014-05-15 (×9): qty 1

## 2014-05-15 MED ORDER — ACETAMINOPHEN 325 MG PO TABS: 650.0000 mg | ORAL_TABLET | ORAL | Status: DC | PRN

## 2014-05-15 MED ORDER — ONDANSETRON HCL 4 MG/2ML IJ SOLN
4.0000 mg | Freq: Once | INTRAMUSCULAR | Status: AC
  Administered 2014-05-15: 4 mg via INTRAVENOUS
  Filled 2014-05-15: qty 2

## 2014-05-15 MED ORDER — ASPIRIN EC 81 MG PO TBEC
81.0000 mg | DELAYED_RELEASE_TABLET | Freq: Every day | ORAL | Status: DC
  Administered 2014-05-15 – 2014-05-18 (×4): 81 mg via ORAL
  Filled 2014-05-15 (×4): qty 1

## 2014-05-15 MED ORDER — ENOXAPARIN SODIUM 40 MG/0.4ML ~~LOC~~ SOLN
40.0000 mg | SUBCUTANEOUS | Status: DC
  Filled 2014-05-15 (×3): qty 0.4

## 2014-05-15 MED ORDER — ALBUTEROL SULFATE HFA 108 (90 BASE) MCG/ACT IN AERS: 2.0000 | INHALATION_SPRAY | Freq: Every day | RESPIRATORY_TRACT | Status: DC | PRN

## 2014-05-15 MED ORDER — ISOSORBIDE MONONITRATE ER 30 MG PO TB24
30.0000 mg | ORAL_TABLET | Freq: Every day | ORAL | Status: DC
  Administered 2014-05-15 – 2014-05-16 (×2): 30 mg via ORAL
  Filled 2014-05-15 (×2): qty 1

## 2014-05-15 MED ORDER — LORATADINE 10 MG PO TABS
10.0000 mg | ORAL_TABLET | Freq: Every day | ORAL | Status: DC
  Administered 2014-05-15 – 2014-05-18 (×4): 10 mg via ORAL
  Filled 2014-05-15 (×4): qty 1

## 2014-05-15 MED ORDER — INSULIN ASPART 100 UNIT/ML ~~LOC~~ SOLN
0.0000 [IU] | Freq: Three times a day (TID) | SUBCUTANEOUS | Status: DC
  Administered 2014-05-16 – 2014-05-17 (×4): 3 [IU] via SUBCUTANEOUS

## 2014-05-15 MED ORDER — MAGNESIUM OXIDE 400 (241.3 MG) MG PO TABS
400.0000 mg | ORAL_TABLET | Freq: Two times a day (BID) | ORAL | Status: DC
  Filled 2014-05-15: qty 1

## 2014-05-15 MED ORDER — HYDRALAZINE HCL 25 MG PO TABS
25.0000 mg | ORAL_TABLET | Freq: Three times a day (TID) | ORAL | Status: DC
Start: 2014-05-15 — End: 2014-05-18
  Administered 2014-05-15 – 2014-05-18 (×10): 25 mg via ORAL
  Filled 2014-05-15 (×12): qty 1

## 2014-05-15 MED ORDER — OXYCODONE-ACETAMINOPHEN 5-325 MG PO TABS
2.0000 | ORAL_TABLET | ORAL | Status: DC | PRN
  Administered 2014-05-15 – 2014-05-16 (×2): 2 via ORAL
  Filled 2014-05-15 (×2): qty 2

## 2014-05-15 MED ORDER — POTASSIUM CHLORIDE CRYS ER 20 MEQ PO TBCR
40.0000 meq | EXTENDED_RELEASE_TABLET | Freq: Two times a day (BID) | ORAL | Status: DC
  Administered 2014-05-15 – 2014-05-16 (×2): 40 meq via ORAL
  Filled 2014-05-15 (×3): qty 2

## 2014-05-15 MED ORDER — POTASSIUM CHLORIDE CRYS ER 20 MEQ PO TBCR
40.0000 meq | EXTENDED_RELEASE_TABLET | Freq: Once | ORAL | Status: AC
  Administered 2014-05-15: 40 meq via ORAL
  Filled 2014-05-15: qty 2

## 2014-05-15 MED ORDER — TRAMADOL HCL 50 MG PO TABS
100.0000 mg | ORAL_TABLET | Freq: Four times a day (QID) | ORAL | Status: DC | PRN
  Administered 2014-05-16 – 2014-05-18 (×5): 100 mg via ORAL
  Filled 2014-05-15 (×5): qty 2

## 2014-05-15 MED ORDER — ONDANSETRON HCL 4 MG/2ML IJ SOLN
4.0000 mg | Freq: Four times a day (QID) | INTRAMUSCULAR | Status: DC | PRN
  Administered 2014-05-17 (×2): 4 mg via INTRAVENOUS
  Filled 2014-05-15 (×2): qty 2

## 2014-05-15 MED ORDER — ALPRAZOLAM 0.25 MG PO TABS
0.2500 mg | ORAL_TABLET | Freq: Two times a day (BID) | ORAL | Status: DC | PRN
  Administered 2014-05-16 – 2014-05-18 (×4): 0.25 mg via ORAL
  Filled 2014-05-15 (×4): qty 1

## 2014-05-15 MED ORDER — CLONAZEPAM 0.5 MG PO TABS: 0.5000 mg | ORAL_TABLET | Freq: Two times a day (BID) | ORAL | Status: DC | PRN

## 2014-05-15 MED ORDER — SODIUM CHLORIDE 0.9 % IJ SOLN
3.0000 mL | Freq: Two times a day (BID) | INTRAMUSCULAR | Status: DC
  Administered 2014-05-17: 3 mL via INTRAVENOUS

## 2014-05-15 MED ORDER — INSULIN GLARGINE 100 UNIT/ML ~~LOC~~ SOLN
25.0000 [IU] | Freq: Every day | SUBCUTANEOUS | Status: DC
  Administered 2014-05-15 – 2014-05-16 (×2): 25 [IU] via SUBCUTANEOUS
  Filled 2014-05-15 (×4): qty 0.25

## 2014-05-15 MED ORDER — FUROSEMIDE 10 MG/ML IJ SOLN
20.0000 mg | Freq: Once | INTRAMUSCULAR | Status: DC
  Filled 2014-05-15: qty 2

## 2014-05-15 MED ORDER — INSULIN ASPART 100 UNIT/ML ~~LOC~~ SOLN: 0.0000 [IU] | Freq: Every day | SUBCUTANEOUS | Status: DC

## 2014-05-15 MED ORDER — SODIUM CHLORIDE 0.9 % IJ SOLN: 3.0000 mL | INTRAMUSCULAR | Status: DC | PRN

## 2014-05-15 MED ORDER — ATORVASTATIN CALCIUM 80 MG PO TABS
80.0000 mg | ORAL_TABLET | Freq: Every day | ORAL | Status: DC
  Administered 2014-05-15 – 2014-05-17 (×3): 80 mg via ORAL
  Filled 2014-05-15 (×4): qty 1

## 2014-05-15 MED ORDER — ALBUTEROL SULFATE (2.5 MG/3ML) 0.083% IN NEBU: 2.5000 mg | INHALATION_SOLUTION | Freq: Four times a day (QID) | RESPIRATORY_TRACT | Status: DC | PRN

## 2014-05-15 MED ORDER — DIGOXIN 125 MCG PO TABS
0.1250 mg | ORAL_TABLET | Freq: Every day | ORAL | Status: DC
  Administered 2014-05-15 – 2014-05-18 (×4): 0.125 mg via ORAL
  Filled 2014-05-15 (×4): qty 1

## 2014-05-15 MED ORDER — MILRINONE IN DEXTROSE 20 MG/100ML IV SOLN
0.3750 ug/kg/min | INTRAVENOUS | Status: DC
  Administered 2014-05-15 – 2014-05-18 (×10): 0.375 ug/kg/min via INTRAVENOUS
  Filled 2014-05-15 (×14): qty 100

## 2014-05-15 MED ORDER — ZOLPIDEM TARTRATE 5 MG PO TABS: 5.0000 mg | ORAL_TABLET | Freq: Every evening | ORAL | Status: DC | PRN

## 2014-05-15 MED ORDER — HYDROMORPHONE HCL PF 1 MG/ML IJ SOLN
1.0000 mg | Freq: Once | INTRAMUSCULAR | Status: AC
Start: 2014-05-15 — End: 2014-05-15
  Administered 2014-05-15: 1 mg via INTRAVENOUS
  Filled 2014-05-15: qty 1

## 2014-05-15 MED ORDER — FUROSEMIDE 10 MG/ML IJ SOLN
40.0000 mg | Freq: Once | INTRAMUSCULAR | Status: AC
  Administered 2014-05-15: 40 mg via INTRAVENOUS
  Filled 2014-05-15: qty 4

## 2014-05-15 MED ORDER — SODIUM CHLORIDE 0.9 % IJ SOLN
10.0000 mL | INTRAMUSCULAR | Status: DC | PRN
  Administered 2014-05-15: 10 mL

## 2014-05-15 MED ORDER — CARVEDILOL 3.125 MG PO TABS
3.1250 mg | ORAL_TABLET | Freq: Two times a day (BID) | ORAL | Status: DC
  Administered 2014-05-15 – 2014-05-18 (×6): 3.125 mg via ORAL
  Filled 2014-05-15 (×8): qty 1

## 2014-05-15 MED ORDER — FUROSEMIDE 10 MG/ML IJ SOLN
20.0000 mg | Freq: Once | INTRAMUSCULAR | Status: AC
  Administered 2014-05-16: 20 mg via INTRAVENOUS

## 2014-05-15 MED ORDER — DIPHENHYDRAMINE HCL 25 MG PO CAPS
25.0000 mg | ORAL_CAPSULE | Freq: Once | ORAL | Status: AC
  Administered 2014-05-15: 25 mg via ORAL
  Filled 2014-05-15: qty 1

## 2014-05-15 MED ORDER — NITROGLYCERIN 0.4 MG SL SUBL: 0.4000 mg | SUBLINGUAL_TABLET | SUBLINGUAL | Status: DC | PRN

## 2014-05-15 MED ORDER — BUMETANIDE 2 MG PO TABS
4.0000 mg | ORAL_TABLET | Freq: Two times a day (BID) | ORAL | Status: DC
  Administered 2014-05-16: 4 mg via ORAL
  Filled 2014-05-15 (×3): qty 2

## 2014-05-15 MED ORDER — FUROSEMIDE 10 MG/ML IJ SOLN: 20.0000 mg | Freq: Once | INTRAMUSCULAR | Status: DC

## 2014-05-15 NOTE — ED Notes (Signed)
Alert and oriented, transported to 3E on monitor with nurse and tele tech

## 2014-05-15 NOTE — ED Notes (Signed)
Pt reports having sob and severe chest pain, hx of chf and pt feels like abd is swelling. Pt was just dc from hospital on Saturday with lifevest which he says has alarmed twice. Also having n/v since discharge. Airway intact, ekg done.

## 2014-05-15 NOTE — H&P (Signed)
The patient was seen and examined, and I agree with the assessment and plan as documented above, with modifications as noted below. Pt with history of NICM on home milrinone as noted above, with repeated hospitalizations and noncompliance issues (not a VAD candidate with consideration for referral to [facility]). EF 35% on 03/30/14 and scheduled for repeat echo with contrast for more optimal endocardial delineation. Wearing LifeVest and being evaluated by Dr. Graciela Husbands for CRT-D. Also being evaluated for etiology of CMP (possibly PVC related). He presently complains of left-sided chest pain related to "heart racing". Says he has been compliant with meds. Also says he hasn't had much of an appetite for past 2 weeks, and believes weight gain related to fluid build up. Denies increase in leg swelling, but says lower abdomen "not full, but a little hard". Increased shortness of breath. Denies hematochezia/melena. Denies fevers but says he always feels cold. Says he has had no difficulty urinating. Hgb low at 8.2 (appears chronic) and K low at 3. BNP slightly elevated when compared to most recent value (2056 currently, 960 on 5/9). Troponins normal. Wt 335 lbs at office visit with Dr. Graciela Husbands on 5/19, 344 lbs in ED today. During my evaluation, HR 110-120 bpm (sinus tach) with PVC's. Will replace K with aim > 4. Will give one dose IV Lasix 40 mg.  Will also transfuse 2 units PRBC's to see if this helps to alleviate symptoms as well, and diurese in between units and immediately thereafter. Also obtain stool guiacs and iron profile. Likely CKD with decreased erythropoietin production also contributing to anemia, but needs thorough workup including EGD and colonoscopy at some point (outpatient). Will resume home meds including milrinone and Bumex.

## 2014-05-15 NOTE — Telephone Encounter (Signed)
Patient called answering service to report chest pain and SOB. Phone number provided as listed [phone]. I attempted several times to return patient call without any answer at number provided. I also called number listed in chart as primary number. However, both numbers give a recorded message stating that the number cannot accept calls at this time. I then called the answering service to ask for their help in trying to reach the patient as he should be instructed to call 911 for emergent attention given his history. I will continue to try and reach patient along with the answering service.

## 2014-05-15 NOTE — ED Provider Notes (Signed)
CSN: [id number]     Arrival date & time 05/15/14  4037 History   First MD Initiated Contact with Patient 05/15/14 1001     Chief Complaint  Patient presents with  . Chest Pain  . Shortness of Breath     (Consider location/radiation/quality/duration/timing/severity/associated sxs/prior Treatment) HPI Steven Wyatt. is a 38 y.o. male who presents to ED with complaint of chest pain. States has chronic pain. States has been worsening in the last 3 days. Was seen in ED 2 days ago, pain treated at that time. No acute process identified at that time that required admission, pt was discharged home with percocet prescription which he did not fill. States pain is sharp, in left chest. Similar to prior. Admits associated SOB, and states he feels like he may be fluid over loaded. States abdomen and legs more swollen in the last two days. Did not take anything for pain prior to coming in. States he is taking all his regular prescribed medications.   Past Medical History  Diagnosis Date  . Chronic systolic CHF (congestive heart failure)     a. on home milrinone - dose titrate to 0.327mcg/kg/min 03/2014.  . Diabetes   . HTN (hypertension)   . Morbid obesity with BMI of 50.0-59.9, adult   . Sleep apnea     on Bi Pap  . Dyslipidemia   . CKD (chronic kidney disease), stage III   . Asthma 01/03/2014  . DKA (diabetic ketoacidoses) 02/25/2014  . Hypokalemia 01/04/2014  . Hyponatremia 02/25/2014  . Microcytic anemia 01/04/2014  . 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 Normal coronary arteries- last cath Oct 2014 [facility] 01/03/2014  . History of chicken pox   . Perirectal abscess 03/2014  . Allergic rhinitis, cause unspecified 04/29/2014  . Shortness of breath   . Anxiety    Past Surgical History  Procedure Laterality Date  . Incision and drainage abscess N/A 03/31/2014    Procedure: INCISION AND DRAINAGE  PERI-RECTAL ABSCESS;  Surgeon: Axel Filler, MD;  Location: [facility] OR;  Service: General;   Laterality: N/A;   Family History  Problem Relation Age of Onset  . Hypertension Mother   . Diabetes Maternal Grandmother   . Liver disease Father    History  Substance Use Topics  . Smoking status: Never Smoker   . Smokeless tobacco: Never Used  . Alcohol Use: No    Review of Systems  Constitutional: Negative for fever and chills.  HENT: Negative for congestion.   Respiratory: Positive for chest tightness and shortness of breath. Negative for cough.   Cardiovascular: Positive for chest pain and leg swelling. Negative for palpitations.  Gastrointestinal: Negative for nausea, vomiting, abdominal pain, diarrhea and abdominal distention.  Genitourinary: Negative for dysuria, urgency, frequency and hematuria.  Musculoskeletal: Negative for arthralgias, myalgias, neck pain and neck stiffness.  Skin: Negative for rash.  Allergic/Immunologic: Negative for immunocompromised state.  Neurological: Negative for dizziness, weakness, light-headedness, numbness and headaches.  All other systems reviewed and are negative.     Allergies  Iodine and Shellfish allergy  Home Medications   Prior to Admission medications   Medication Sig Start Date End Date Taking? Authorizing Provider  albuterol (PROVENTIL HFA;VENTOLIN HFA) 108 (90 BASE) MCG/ACT inhaler Inhale 2 puffs into the lungs daily as needed for wheezing or shortness of breath. 01/08/14  Yes Wilburt Finlay, PA-C  aspirin EC 81 MG tablet Take 81 mg by mouth daily.   Yes Historical Provider, MD  atorvastatin (LIPITOR) 80 MG tablet  Take 80 mg by mouth daily. 01/08/14  Yes Wilburt Finlay, PA-C  bumetanide (BUMEX) 2 MG tablet Take 2 tablets (4 mg total) by mouth 2 (two) times daily. 01/16/14  Yes Amy D Clegg, NP  carvedilol (COREG) 3.125 MG tablet Take 1 tablet (3.125 mg total) by mouth 2 (two) times daily. 02/20/14  Yes Dolores Patty, MD  clonazePAM (KLONOPIN) 0.5 MG tablet Take 1 tablet (0.5 mg total) by mouth 2 (two) times daily as needed for  anxiety. 04/27/14  Yes Corwin Levins, MD  digoxin (LANOXIN) 0.125 MG tablet Take 1 tablet (0.125 mg total) by mouth daily. 01/16/14  Yes Amy D Clegg, NP  hydrALAZINE (APRESOLINE) 25 MG tablet Take 1 tablet (25 mg total) by mouth every 8 (eight) hours. 01/23/14  Yes Amy D Clegg, NP  insulin aspart (NOVOLOG FLEXPEN) 100 UNIT/ML FlexPen Inject 1-15 Units into the skin 3 (three) times daily with meals. Sliding scale 04/27/14  Yes Corwin Levins, MD  insulin glargine (LANTUS) 100 UNIT/ML injection Inject 0.25 mLs (25 Units total) into the skin at bedtime. 04/27/14  Yes Corwin Levins, MD  isosorbide mononitrate (IMDUR) 30 MG 24 hr tablet Take 1 tablet (30 mg total) by mouth daily. 01/16/14  Yes Amy D Clegg, NP  loratadine (CLARITIN) 10 MG tablet Take 1 tablet (10 mg total) by mouth daily. 04/27/14  Yes Corwin Levins, MD  magnesium oxide (MAG-OX) 400 MG tablet Take 1 tablet (400 mg total) by mouth 2 (two) times daily. 03/07/14  Yes Aundria Rud, NP  milrinone [facility]) 20 MG/100ML SOLN infusion Inject 62.7375 mcg/min into the vein continuous. 04/15/14  Yes Ok Anis, NP  oxyCODONE-acetaminophen (PERCOCET) 5-325 MG per tablet Take 2 tablets by mouth every 4 (four) hours as needed. 05/14/14  Yes Brandt Loosen, MD  potassium chloride SA (K-DUR,KLOR-CON) 20 MEQ tablet Take 2 tablets (40 mEq total) by mouth 2 (two) times daily. 04/15/14  Yes Ok Anis, NP  traMADol (ULTRAM) 50 MG tablet Take 2 tablets (100 mg total) by mouth every 6 (six) hours as needed for moderate pain. 04/15/14  Yes Ok Anis, NP   BP 118/71  Pulse 109  Temp(Src) 97.8 F (36.6 C) (Oral)  Resp 26  SpO2 96% Physical Exam  Nursing note and vitals reviewed. Constitutional: He appears well-developed and well-nourished. No distress.  HENT:  Head: Normocephalic and atraumatic.  Eyes: Conjunctivae are normal.  Neck: Neck supple.  Cardiovascular: Normal rate, regular rhythm and normal heart sounds.   Pulmonary/Chest: Effort  normal. No respiratory distress. He has no wheezes. He has no rales. He exhibits tenderness.  Tender to palpation over left upper chest wall  Abdominal: Soft. Bowel sounds are normal. He exhibits no distension. There is no tenderness. There is no rebound.  Musculoskeletal:  Bilateral leg swelling  Neurological: He is alert.  Skin: Skin is warm and dry.    ED Course  Procedures (including critical care time) Labs Review Labs Reviewed  CBC WITH DIFFERENTIAL  COMPREHENSIVE METABOLIC PANEL  PRO B NATRIURETIC PEPTIDE  I-STAT TROPOININ, ED    Imaging Review Dg Chest Port 1 View  05/14/2014   CLINICAL DATA:  Chest pain and shortness of breath.  EXAM: PORTABLE CHEST - 1 VIEW  COMPARISON:  04/29/2014 and 01/03/2014  FINDINGS: External defibrillator in place. PICC tip is in the superior vena cava at the level of the carina. There is chronic cardiomegaly. Pulmonary vascularity is normal. No infiltrates or effusions. No acute osseous abnormality.  IMPRESSION: No acute abnormalities.  Chronic cardiomegaly.   Electronically Signed   By: Geanie CooleyJim  Maxwell M.D.   On: 05/14/2014 00:46     EKG Interpretation None      MDM   Final diagnoses:  Chest pain    Patient's with persistent chest pain and shortness of breath, history of advanced nonischemic cardiomyopathy, CHF. Patient requesting pain medications. Multiple visits to ED for the same, multiple admissions as well. Patient is on milrinone at home through a PICC line. We'll get labs, chest x-ray, pain medications ordered.    Chest x-ray and labs are patient's baseline. Patient was weighed, gained 9 pounds in the last 6 days. I discussed patient cardiologist they will come and see patient   Filed Vitals:   05/15/14 1300 05/15/14 1337 05/15/14 1400 05/15/14 1447  BP: 120/73  111/71 111/71  Pulse: 110   114  Temp:      TempSrc:      Resp: 18  21   Weight:  344 lb (156.037 kg)    SpO2: 100%   99%   `    Lottie Musselatyana A Nafeesa Dils,  PA-C 05/15/14 1614

## 2014-05-15 NOTE — ED Notes (Signed)
Cardiology in room with patient at this time.

## 2014-05-15 NOTE — ED Notes (Signed)
Patient transported to X-ray 

## 2014-05-15 NOTE — Progress Notes (Signed)
Report given to receiving RN. Patient sitting on the side of the bed watching TV. No verbal complaints and no signs or symptoms of distress noted.

## 2014-05-15 NOTE — H&P (Signed)
CARDIOLOGY HISTORY AND PHYSICAL   Patient ID: Steven Wyatt. MRN: [id number] DOB/AGE: [birth year] 38 y.o.  Admit date: 05/15/2014  Primary Physician   Oliver Barre, MD Primary Cardiologist   Dr. Gala Romney Reason for Consultation   chest pain, shortness of breath  Steven Wyatt. is a 38 y.o. male with a history of NICM on milrinone. He has had chest pain (6/10 for months) but about 6 weeks ago, the pain got worse. Every day, the pain goes to 9/10. Sharp, worse with deep inspiration or cough. ER visits, hospital stays, no help, he has poor pain control. The pain makes him feel SOB and fatigued. Last night, the pain was waking him, with the SOB was worse. Weight has been up/down about 7 lbs, but with the more severe pain and SOB, he came in. Right now, the pain is a 7/10.    Dr. Gala Romney has discussed the pain with him, he was told it was a consequence of his heart being weak and having to work hard to pump the blood. Also, he feels the pain has been worse since his milrinone dose was increased in April.  Past Medical History  Diagnosis Date  . Chronic systolic CHF (congestive heart failure)     a. on home milrinone - dose titrate to 0.399mcg/kg/min 03/2014.  . Diabetes   . HTN (hypertension)   . Morbid obesity with BMI of 50.0-59.9, adult   . Sleep apnea     on Bi Pap  . Dyslipidemia   . CKD (chronic kidney disease), stage III   . Asthma 01/03/2014  . DKA (diabetic ketoacidoses) 02/25/2014  . Hypokalemia 01/04/2014  . Hyponatremia 02/25/2014  . Microcytic anemia 01/04/2014  . 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 Normal coronary arteries- last cath Oct 2014 at [facility] 01/03/2014  . History of chicken pox   . Perirectal abscess 03/2014  . Allergic rhinitis, cause unspecified 04/29/2014  . Shortness of breath   . Anxiety      Past Surgical History  Procedure Laterality Date  . Incision and drainage abscess N/A 03/31/2014    Procedure: INCISION AND DRAINAGE   PERI-RECTAL ABSCESS;  Surgeon: Axel Filler, MD;  Location: [facility] OR;  Service: General;  Laterality: N/A;  . Cardiac catheterization  October 2014    [facility]; reportedly clean, no further details available    Allergies  Allergen Reactions  . Iodine Anaphylaxis  . Shellfish Allergy Anaphylaxis    I have reviewed the patient's current medications . potassium chloride  40 mEq Oral Once   Prior to Admission medications   Medication Sig  albuterol (PROVENTIL HFA;VENTOLIN HFA) 108 (90 BASE) MCG/ACT inhaler Inhale 2 puffs into the lungs daily as needed for wheezing or shortness of breath.  aspirin EC 81 MG tablet Take 81 mg by mouth daily.  atorvastatin (LIPITOR) 80 MG tablet Take 80 mg by mouth daily.  bumetanide (BUMEX) 2 MG tablet Take 2 tablets (4 mg total) by mouth 2 (two) times daily.  carvedilol (COREG) 3.125 MG tablet Take 1 tablet (3.125 mg total) by mouth 2 (two) times daily.  clonazePAM (KLONOPIN) 0.5 MG tablet Take 1 tablet (0.5 mg total) by mouth 2 (two) times daily as needed for anxiety.  digoxin (LANOXIN) 0.125 MG tablet Take 1 tablet (0.125 mg total) by mouth daily.  hydrALAZINE (APRESOLINE) 25 MG tablet Take 1 tablet (25 mg total) by mouth every 8 (eight) hours.  insulin aspart (NOVOLOG FLEXPEN) 100 UNIT/ML FlexPen  Inject 1-15 Units into the skin 3 (three) times daily with meals. Sliding scale  insulin glargine (LANTUS) 100 UNIT/ML injection Inject 0.25 mLs (25 Units total) into the skin at bedtime.  isosorbide mononitrate (IMDUR) 30 MG 24 hr tablet Take 1 tablet (30 mg total) by mouth daily.  loratadine (CLARITIN) 10 MG tablet Take 1 tablet (10 mg total) by mouth daily.  magnesium oxide (MAG-OX) 400 MG tablet Take 1 tablet (400 mg total) by mouth 2 (two) times daily.  milrinone (PRIMACOR) 20 MG/100ML SOLN infusion Inject 62.7375 mcg/min into the vein continuous.  oxyCODONE-acetaminophen (PERCOCET) 5-325 MG per tablet Take 2 tablets by mouth every 4  (four) hours as needed.  potassium chloride SA (K-DUR,KLOR-CON) 20 MEQ tablet Take 2 tablets (40 mEq total) by mouth 2 (two) times daily.  traMADol (ULTRAM) 50 MG tablet Take 2 tablets (100 mg total) by mouth every 6 (six) hours as needed for moderate pain.     History   Social History  . Marital Status: Married    Spouse Name: N/A    Number of Children: N/A  . Years of Education: N/A   Occupational History  . 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 Smoking status: Never Smoker   . Smokeless tobacco: Never Used  . Alcohol Use: No  . Drug Use: No  . Sexual Activity: Yes   Other Topics Concern  . Not on file   Social History Narrative   Lives with wife and 2/5 children.    Family Status  Relation Status Death Age  . Mother Alive     No hx CAD  . Father Deceased 1850s    Cancer   Family History  Problem Relation Age of Onset  . Hypertension Mother   . Diabetes Maternal Grandmother   . Liver disease Father      ROS: Has been compliant w/ diet, Rx. Feels family situation is stable but chafes at not being able to work. Full 14 point review of systems complete and found to be negative unless listed above.  Physical Exam: Blood pressure 111/71, pulse 114, temperature 97.8 F (36.6 C), temperature source Oral, resp. rate 21, weight 344 lb (156.037 kg), SpO2 99.00%. Weight 335 on 05/19 office visit. General: Well developed, well nourished, male in no acute distress Head: Eyes PERRLA, No xanthomas.   Normocephalic and atraumatic, oropharynx without edema or exudate. Dentition: good Lungs: decreased BS bases, few rales Heart: HRRR S1 S2, no rub/gallop, ?soft murmur. +S3;  pulses are 2+ all 4 extrem.   Neck: No carotid bruits. No lymphadenopathy.  JVD difficult to assess, minimal elevation. Abdomen: Bowel sounds present, abdomen soft and non-tender without masses or hernias noted. Msk:  No spine or cva tenderness. No weakness, no joint deformities  or effusions. Extremities: No clubbing or cyanosis. no edema.  Neuro: Alert and oriented X 3. No focal deficits noted. Psych:  Good affect, responds appropriately Skin: No rashes or lesions noted.  Labs:  Lab Results  Component Value Date   WBC 6.4 05/15/2014   HGB 8.2* 05/15/2014   HCT 26.6* 05/15/2014   MCV 79.4 05/15/2014   PLT 404* 05/15/2014     Recent Labs Lab 05/15/14 1200  NA 138  K 3.0*  CL 93*  CO2 28  BUN 29*  CREATININE 1.64*  CALCIUM 9.1  PROT 7.8  BILITOT 2.7*  ALKPHOS 80  ALT 34  AST 31  GLUCOSE 128*  ALBUMIN 3.6  Recent Labs  05/14/14 0037 05/15/14 1205  TROPIPOC 0.03 0.04   Pro B Natriuretic peptide (BNP)  Date/Time Value Ref Range Status  05/15/2014 12:00 PM 2056.0* 0 - 125 pg/mL Final  05/14/2014 12:10 AM 1806.0* 0 - 125 pg/mL Final   Echo: 03/30/2014 Conclusions - Left ventricle: Very poor image quality and little endocardial definition Consider MRO or contrast echo if clinically indicated to furhter assess EF The cavity size was severely dilated. The estimated ejection fraction was 35%. Diffuse hypokinesis. - Left atrium: The atrium was mildly dilated.  ECG:  05/15/2014 Sinus tachycardia Vent. rate 109 BPM PR interval 174 ms QRS duration 138 ms QT/QTc 354/476 ms P-R-T axes 63 231 84  Radiology:  Dg Chest 2 View 05/15/2014   CLINICAL DATA:  CHEST PAIN SHORTNESS OF BREATH  EXAM: CHEST - 2 VIEW  COMPARISON:  the previous day's study  FINDINGS: Right arm PICC line remains with its tip in the mid SVC. Hazy perihilar opacities bilaterally, stable since studies back to 04/22/2014. Moderate cardiomegaly as before. . No effusion. Visualized skeletal structures are unremarkable.  IMPRESSION: Stable cardiomegaly and hazy perihilar opacities.   Electronically Signed   By: Oley Balm M.D.   On: 05/15/2014 11:53   Dg Chest Port 1 View 05/14/2014   CLINICAL DATA:  Chest pain and shortness of breath.  EXAM: PORTABLE CHEST - 1 VIEW  COMPARISON:   04/29/2014 and 01/03/2014  FINDINGS: External defibrillator in place. PICC tip is in the superior vena cava at the level of the carina. There is chronic cardiomegaly. Pulmonary vascularity is normal. No infiltrates or effusions. No acute osseous abnormality.  IMPRESSION: No acute abnormalities.  Chronic cardiomegaly.   Electronically Signed   By: Geanie Cooley M.D.   On: 05/14/2014 00:46    ASSESSMENT AND PLAN:   The patient was seen today by Dr. Purvis Sheffield, the patient evaluated and the data reviewed.  Principal Problem:   Precordial pain - PRN rx Active Problems:   HTN (hypertension) - follow w/ diuresis   Hypokalemia - supp and follow   Acute on Chronic systolic heart failure - diurese and follow weights/I/O   Diabetes mellitus - SSI    Anemia - eval   Signed: Darrol Jump, PA-C 05/15/2014 2:58 PM Beeper [phone]  Co-Sign MD

## 2014-05-16 LAB — GLUCOSE, CAPILLARY
GLUCOSE-CAPILLARY: 162 mg/dL — AB (ref 70–99)
GLUCOSE-CAPILLARY: 128 mg/dL — AB (ref 70–99)
Glucose-Capillary: 120 mg/dL — ABNORMAL HIGH (ref 70–99)
Glucose-Capillary: 162 mg/dL — ABNORMAL HIGH (ref 70–99)

## 2014-05-16 LAB — IRON AND TIBC
IRON: 34 ug/dL — AB (ref 42–135)
Saturation Ratios: 7 % — ABNORMAL LOW (ref 20–55)
TIBC: 471 ug/dL — AB (ref 215–435)
UIBC: 437 ug/dL — ABNORMAL HIGH (ref 125–400)

## 2014-05-16 LAB — BASIC METABOLIC PANEL
BUN: 34 mg/dL — ABNORMAL HIGH (ref 6–23)
CALCIUM: 9.2 mg/dL (ref 8.4–10.5)
CHLORIDE: 96 meq/L (ref 96–112)
CO2: 27 mEq/L (ref 19–32)
Creatinine, Ser: 1.92 mg/dL — ABNORMAL HIGH (ref 0.50–1.35)
GFR calc non Af Amer: 44 mL/min — ABNORMAL LOW (ref >90)
GFR, EST AFRICAN AMERICAN: 51 mL/min — AB (ref >90)
Glucose, Bld: 115 mg/dL — ABNORMAL HIGH (ref 70–99)
Potassium: 3.9 mEq/L (ref 3.7–5.3)
SODIUM: 138 meq/L (ref 137–147)

## 2014-05-16 LAB — FERRITIN: Ferritin: 73 ng/mL (ref 22–322)

## 2014-05-16 LAB — CARBOXYHEMOGLOBIN
CARBOXYHEMOGLOBIN: 2.9 % — AB (ref 0.5–1.5)
Methemoglobin: 0.8 % (ref 0.0–1.5)
O2 Saturation: 67.4 %
Total hemoglobin: 9.4 g/dL — ABNORMAL LOW (ref 13.5–18.0)

## 2014-05-16 LAB — FOLATE: Folate: 9.5 ng/mL

## 2014-05-16 LAB — HEMOGLOBIN AND HEMATOCRIT, BLOOD
HEMATOCRIT: 28.5 % — AB (ref 39.0–52.0)
Hemoglobin: 9.2 g/dL — ABNORMAL LOW (ref 13.0–17.0)

## 2014-05-16 LAB — TROPONIN I: Troponin I: <0.3 ng/mL (ref <0.30)

## 2014-05-16 MED ORDER — ISOSORBIDE MONONITRATE ER 60 MG PO TB24
60.0000 mg | ORAL_TABLET | Freq: Every day | ORAL | Status: DC
  Administered 2014-05-17 – 2014-05-18 (×2): 60 mg via ORAL
  Filled 2014-05-16 (×2): qty 1

## 2014-05-16 MED ORDER — PANTOPRAZOLE SODIUM 40 MG PO TBEC
40.0000 mg | DELAYED_RELEASE_TABLET | Freq: Every day | ORAL | Status: DC
  Administered 2014-05-16 – 2014-05-18 (×3): 40 mg via ORAL
  Filled 2014-05-16 (×2): qty 1

## 2014-05-16 MED ORDER — TRAMADOL HCL 50 MG PO TABS: 100.0000 mg | ORAL_TABLET | Freq: Four times a day (QID) | ORAL | Status: DC | PRN

## 2014-05-16 MED ORDER — ISOSORBIDE MONONITRATE ER 30 MG PO TB24
30.0000 mg | ORAL_TABLET | Freq: Once | ORAL | Status: AC
  Administered 2014-05-16: 30 mg via ORAL
  Filled 2014-05-16: qty 1

## 2014-05-16 NOTE — Telephone Encounter (Signed)
Pt admitted to the hospital 5/25

## 2014-05-16 NOTE — Progress Notes (Signed)
Seen by Ulla Potash , Life vest to d/c . Pt informed

## 2014-05-16 NOTE — Care Management Note (Addendum)
  Page 2 of 2   05/19/2014     2:01:53 PM CARE MANAGEMENT NOTE 05/19/2014  Patient:  Steven Wyatt, Steven Wyatt   Account Number:  [id number]  Date Initiated:  05/16/2014  Documentation initiated by:  Pearly Apachito  Subjective/Objective Assessment:   Admitted with Afib     Action/Plan:   CM to follow for disposition needs   Anticipated DC Date:  05/18/2014   Anticipated DC Plan:  HOME W HOME HEALTH SERVICES      DC Planning Services  CM consult      [facility] Choice  HOME HEALTH   Choice offered to / List presented to:  NA      DME agency  [facility]     [facility] arranged  HH-1 RN      [facility] agency  [facility]   Status of service:  Completed, signed off Medicare Important Message given?  YES (If response is "NO", the following Medicare IM given date fields will be blank) Date Medicare IM given:  05/15/2014 Date Additional Medicare IM given:  05/18/2014  Discharge Disposition:  HOME W HOME HEALTH SERVICES  Per UR Regulation:  Reviewed for med. necessity/level of care/duration of stay  If discussed at Long Length of Stay Meetings, dates discussed:    Comments:  Tyreka Henneke RN, BSN, MSHL, CCM  Nurse - Case Manager, (Unit [place])  [phone]  05/18/2014 Social:  From home with wife and children. Home DME:  CPap, home infusion supplies Life Vest: active - CM consulted with Zoll Lequita Asal re:  non-compliance with Life Vest: Sherrine Maples spoke with patient re:  this issue. HF clinic: yes - SW / Annice Pih HHS:  RN - IV Milronone gtt - [facility] active and resumed services.    Brandie Lopes RN, BSN, MSHL, CCM  Nurse - Case Manager, (Unit [place])  [phone]  05/16/2014 milrinone infusion From home with wife and children. Home DME:  CPap, home infusion supplies Life Vest: active HF clinic: yes - SW / Annice Pih HHS:  RN - IV Milronone gtt - [facility] active Disposition: Pending.

## 2014-05-16 NOTE — Progress Notes (Signed)
1735 Was  Called in that pt has palpitation . Pt has no apparent distress . Heart beats/m 102 . Pt eating dinner this time . Requesting for percocet . Will refer to MD

## 2014-05-16 NOTE — Progress Notes (Signed)
EKG done . Dr. Reuben Likes will see the pt . Pt informed

## 2014-05-16 NOTE — Progress Notes (Signed)
Advanced Heart Failure Rounding Note   Subjective:    38 yr old AAM with a history of NICM (nor cors Oct 2014), chronic systolic HF ECHO 1/61091/2015 EF 20% on chronic home milrinone via PICC, morbid obesity, OSA on BIPAP, HTN, CKD uncontrolled DM, and HLD. He has been followed closely in HF clinic chronic home Milrinone 0.375 mcg , recent d/c from hospital on 05/01/2014  Echo 4/15 EF 35% (?) - very poor windows   Admitted last night (5/25) for CP and SOB. Troponin's normal, pro-BNP slightly elevated 2056, Hgb 7.9 and Cr 1.64. On admission his HR 110-120 with PVC's. Received 40 mg IV lasix.    Objective:   Weight Range:  Vital Signs:   Temp:  [97.3 F (36.3 C)-98.2 F (36.8 C)] 97.7 F (36.5 C) (05/26 0558) Pulse Rate:  [61-119] 105 (05/26 0854) Resp:  [15-26] 18 (05/26 0854) BP: (95-126)/(42-87) 126/68 mmHg (05/26 0854) SpO2:  [93 %-100 %] 93 % (05/26 0854) FiO2 (%):  [96 %] 96 % (05/25 1032) Weight:  [344 lb (156.037 kg)-347 lb 6.4 oz (157.58 kg)] 347 lb 6.4 oz (157.58 kg) (05/26 0558) Last BM Date: 05/13/14  Weight change: Filed Weights   05/15/14 1337 05/15/14 1700 05/16/14 0558  Weight: 344 lb (156.037 kg) 344 lb 3.2 oz (156.128 kg) 347 lb 6.4 oz (157.58 kg)    Intake/Output:   Intake/Output Summary (Last 24 hours) at 05/16/14 1005 Last data filed at 05/16/14 0935  Gross per 24 hour  Intake    505 ml  Output    800 ml  Net   -295 ml     Physical Exam: General: Chronically ill appearing. No resp difficulty;  HEENT: normal  Neck: Thick. JVP difficult to assess d/t body habitus but appears flat; Carotids 2+ bilaterally; no bruits. No lymphadenopathy or thryomegaly appreciated.  Cor: PMI nonpalpable. Regular rate & rhythm. No rubs, gallops or murmurs.  Lungs: clear  Abdomen: obese soft, nontender, nondistended.Good bowel sounds.  Extremities: no cyanosis, clubbing, rash. LUE double lumen PICC. Chronic venous stasis changes but only trace ankle edema.  Neuro: alert &  orientedx3, cranial nerves grossly intact. Moves all 4 extremities w/o difficulty. Affect pleasant   Telemetry: ST 100 with PVCs  Labs: Basic Metabolic Panel:  Recent Labs Lab 05/14/14 0010 05/14/14 0039 05/15/14 1200 05/16/14 0536  NA 133* 136* 138 138  K 2.8* 2.6* 3.0* 3.9  CL 90* 92* 93* 96  CO2 27  --  28 27  GLUCOSE 157* 160* 128* 115*  BUN 30* 28* 29* 34*  CREATININE 1.53* 1.60* 1.64* 1.92*  CALCIUM 8.8  --  9.1 9.2    Liver Function Tests:  Recent Labs Lab 05/14/14 0010 05/15/14 1200  AST 42* 31  ALT 42 34  ALKPHOS 91 80  BILITOT 2.7* 2.7*  PROT 8.1 7.8  ALBUMIN 3.6 3.6   No results found for this basename: LIPASE, AMYLASE,  in the last 168 hours No results found for this basename: AMMONIA,  in the last 168 hours  CBC:  Recent Labs Lab 05/14/14 0010 05/14/14 0039 05/15/14 1200 05/16/14 0536  WBC 4.9  --  6.4  --   NEUTROABS 3.9  --  5.2  --   HGB 7.9* 10.5* 8.2* 9.2*  HCT 25.1* 31.0* 26.6* 28.5*  MCV 78.9  --  79.4  --   PLT 366  --  404*  --     Cardiac Enzymes:  Recent Labs Lab 05/15/14 1840 05/15/14 2320 05/16/14 0536  TROPONINI <0.30 <0.30 <0.30    BNP: BNP (last 3 results)  Recent Labs  04/29/14 2305 05/14/14 0010 05/15/14 1200  PROBNP 960.7* 1806.0* 2056.0*      Imaging: Dg Chest 2 View  05/15/2014   CLINICAL DATA:  CHEST PAIN SHORTNESS OF BREATH  EXAM: CHEST - 2 VIEW  COMPARISON:  the previous day's study  FINDINGS: Right arm PICC line remains with its tip in the mid SVC. Hazy perihilar opacities bilaterally, stable since studies back to 04/22/2014. Moderate cardiomegaly as before. . No effusion. Visualized skeletal structures are unremarkable.  IMPRESSION: Stable cardiomegaly and hazy perihilar opacities.   Electronically Signed   By: Oley Balm M.D.   On: 05/15/2014 11:53      Medications:     Scheduled Medications: . aspirin EC  81 mg Oral Daily  . atorvastatin  80 mg Oral QPC supper  . bumetanide  4 mg  Oral BID  . carvedilol  3.125 mg Oral BID WC  . digoxin  0.125 mg Oral Daily  . enoxaparin (LOVENOX) injection  40 mg Subcutaneous Q24H  . furosemide  20 mg Intravenous Once  . hydrALAZINE  25 mg Oral 3 times per day  . insulin aspart  0-15 Units Subcutaneous TID WC  . insulin aspart  0-5 Units Subcutaneous QHS  . insulin glargine  25 Units Subcutaneous QHS  . isosorbide mononitrate  30 mg Oral Daily  . loratadine  10 mg Oral Daily  . magnesium oxide  400 mg Oral BID  . potassium chloride SA  40 mEq Oral BID  . sodium chloride  3 mL Intravenous Q12H     Infusions: . milrinone 0.375 mcg/kg/min (05/16/14 0426)     PRN Medications:  sodium chloride, acetaminophen, albuterol, ALPRAZolam, clonazePAM, nitroGLYCERIN, ondansetron (ZOFRAN) IV, oxyCODONE-acetaminophen, sodium chloride, sodium chloride, traMADol, zolpidem   Assessment:   1) A/C Systolic HF 2) NICM - EF 30-35% (03/2014) 3) CP 4) HTN 5) DM2 6) OSA 7) CKD, stage III 8) Hypokalemia 9) Anemia  Plan/Discussion:    Stable overnight. Received 2 PRBC for chronic anemia with lasix in between. Hgb now 9.2, will continue to follow will need full work-up on outpatient side.   Volume status appears at baseline and Cr elevated. Will hold diuretics today and continue to follow.   Still having CP, but now reports 6/10 with Percocet. Patient was not on PPI before admission and now is on one and CP could be GI related. Lengthy discussion with patient about long term use of narcotics and the concern we have with this. Will try to wean down and if needs on OP side will need to see Pain Clinic.  Still having PVCs. K+ stable. Will see if we can get results from Holter Monitor and Life Vest.  Will repeat ECHO with contrast.   Length of Stay: 1  Aundria Rud NP-C 05/16/2014, 10:05 AM  Advanced Heart Failure Team Pager [phone] (M-F; 7a - 4p)  Please contact [facility] for night-coverage after hours (4p -7a ) and weekends  on [email or web address]  Patient seen and examined with Ulla Potash, NP. We discussed all aspects of the encounter. I agree with the assessment and plan as stated above.   Suspect CP is noncardiac. He is requesting Percocet. I told him we cannot prescribe this for him on a chronic basis from HF Clinic. Would need to see Pain Clinic. Will try Ultram again. Weight is up but volume status looks good on exam and creatinine has bumped  some. Will hold diuretics today. Check co-ox. Will also get results of 48 hour Holter to review. Suggest outpatient w/u of anemia with Heme (seems out of proportion to CKD).  Bevelyn Buckles Bensimhon,MD 12:53 PM

## 2014-05-16 NOTE — Progress Notes (Signed)
[facility]  Patient Status: Active (receiving services up to time of hospitalization)  [facility] is providing the following services: RN, MSW and Home Infusion Services (teaching and education will be done by nurse in the home with patient and caregiver)  If patient discharges after hours, please call [phone].   Kizzie Furnish 05/16/2014, 11:54 AM

## 2014-05-16 NOTE — Progress Notes (Signed)
0700 Pt asleep with life vest on , cpap in use

## 2014-05-16 NOTE — Progress Notes (Signed)
UR completed Krishawn Vanderweele K. Conor Filsaime, RN, BSN, MSHL, CCM  05/16/2014 11:30 AM

## 2014-05-16 NOTE — Progress Notes (Signed)
Nutrition Brief Note  Patient identified on the Malnutrition Screening Tool (MST) Report  Wt Readings from Last 15 Encounters:  05/16/14 347 lb 6.4 oz (157.58 kg)  05/09/14 335 lb (151.955 kg)  05/08/14 321 lb (145.605 kg)  05/01/14 348 lb 4.8 oz (157.988 kg)  04/27/14 345 lb 12 oz (156.831 kg)  04/27/14 345 lb 12.8 oz (156.854 kg)  04/24/14 343 lb 0.6 oz (155.6 kg)  04/16/14 345 lb 14.4 oz (156.899 kg)  04/01/14 330 lb 3.2 oz (149.778 kg)  04/01/14 330 lb 3.2 oz (149.778 kg)  03/23/14 339 lb 12.8 oz (154.132 kg)  03/20/14 339 lb 8 oz (153.996 kg)  03/09/14 347 lb (157.398 kg)  02/26/14 341 lb 0.8 oz (154.7 kg)  02/20/14 339 lb 12.8 oz (154.132 kg)    Body mass index is 52.83 kg/(m^2). Patient meets criteria for Morbid Obesity based on current BMI. Pt states he had a poor appetite for 2 weeks PTA and was eating very little, losing down to 321 lbs.   Current diet order is Carb Modified, patient is consuming approximately 100% of meals at this time. Pt states his appetite has improved and he is eating better. He reports following a low sodium/heart healthy diet at home with his wife and kids. He denies any nutrition related questions at this time. Labs and medications reviewed.   No nutrition interventions warranted at this time. If nutrition issues arise, please consult RD.   Ian Malkin RD, LDN Inpatient Clinical Dietitian Pager: [phone] After Hours Pager: [phone]

## 2014-05-17 ENCOUNTER — Ambulatory Visit: Payer: Medicare Other | Admitting: Internal Medicine

## 2014-05-17 DIAGNOSIS — I509 Heart failure, unspecified: Secondary | ICD-10-CM

## 2014-05-17 DIAGNOSIS — I5023 Acute on chronic systolic (congestive) heart failure: Secondary | ICD-10-CM

## 2014-05-17 LAB — PREPARE RBC (CROSSMATCH)

## 2014-05-17 LAB — BASIC METABOLIC PANEL
BUN: 39 mg/dL — ABNORMAL HIGH (ref 6–23)
CALCIUM: 9.2 mg/dL (ref 8.4–10.5)
CO2: 26 mEq/L (ref 19–32)
CREATININE: 2.2 mg/dL — AB (ref 0.50–1.35)
Chloride: 93 mEq/L — ABNORMAL LOW (ref 96–112)
GFR calc Af Amer: 43 mL/min — ABNORMAL LOW (ref >90)
GFR calc non Af Amer: 37 mL/min — ABNORMAL LOW (ref >90)
GLUCOSE: 125 mg/dL — AB (ref 70–99)
Potassium: 4.2 mEq/L (ref 3.7–5.3)
Sodium: 133 mEq/L — ABNORMAL LOW (ref 137–147)

## 2014-05-17 LAB — CBC
HCT: 29 % — ABNORMAL LOW (ref 39.0–52.0)
Hemoglobin: 9.4 g/dL — ABNORMAL LOW (ref 13.0–17.0)
MCH: 25.7 pg — AB (ref 26.0–34.0)
MCHC: 32.4 g/dL (ref 30.0–36.0)
MCV: 79.2 fL (ref 78.0–100.0)
PLATELETS: 385 10*3/uL (ref 150–400)
RBC: 3.66 MIL/uL — ABNORMAL LOW (ref 4.22–5.81)
RDW: 17.3 % — AB (ref 11.5–15.5)
WBC: 5.2 10*3/uL (ref 4.0–10.5)

## 2014-05-17 LAB — MAGNESIUM: MAGNESIUM: 2.3 mg/dL (ref 1.5–2.5)

## 2014-05-17 LAB — GLUCOSE, CAPILLARY
GLUCOSE-CAPILLARY: 118 mg/dL — AB (ref 70–99)
GLUCOSE-CAPILLARY: 153 mg/dL — AB (ref 70–99)
GLUCOSE-CAPILLARY: 118 mg/dL — AB (ref 70–99)
Glucose-Capillary: 155 mg/dL — ABNORMAL HIGH (ref 70–99)

## 2014-05-17 LAB — CARBOXYHEMOGLOBIN
Carboxyhemoglobin: 2.4 % — ABNORMAL HIGH (ref 0.5–1.5)
Methemoglobin: 0.8 % (ref 0.0–1.5)
O2 Saturation: 67.5 %
Total hemoglobin: 6.9 g/dL — CL (ref 13.5–18.0)
Total oxygen content: 19.6 mL/dL (ref 15.0–23.0)

## 2014-05-17 MED ORDER — ALTEPLASE 2 MG IJ SOLR
2.0000 mg | Freq: Once | INTRAMUSCULAR | Status: AC
  Administered 2014-05-17: 2 mg
  Filled 2014-05-17: qty 2

## 2014-05-17 MED ORDER — AMIODARONE HCL 200 MG PO TABS
400.0000 mg | ORAL_TABLET | Freq: Two times a day (BID) | ORAL | Status: DC
  Administered 2014-05-17 – 2014-05-18 (×3): 400 mg via ORAL
  Filled 2014-05-17 (×4): qty 2

## 2014-05-17 MED ORDER — BIOTENE DRY MOUTH MT LIQD: 15.0000 mL | Freq: Two times a day (BID) | OROMUCOSAL | Status: DC

## 2014-05-17 NOTE — Progress Notes (Deleted)
1410 discharged to home via [facility] triage  Transport . All equipment ready and available  at home as per social worker , Lupita Leash

## 2014-05-17 NOTE — Progress Notes (Signed)
Patient currently on CPAP via nasal mask, with 4 lpm O2 bleed in. Patiently tolerating well at this time.

## 2014-05-17 NOTE — Progress Notes (Signed)
Placed a call to [place] [facility] re Carboxyhgb results.

## 2014-05-17 NOTE — Progress Notes (Signed)
1858 Follow up call done to Steven Wyatt . Called back  Report given  Will call .  Cardiology . Annabelle Harman called back  . Cbc ordered stat . Iv team notified stat. x2 .   Rosalva Ferron , RN  Will follow up

## 2014-05-17 NOTE — Progress Notes (Signed)
Advanced Heart Failure Rounding Note   Subjective:    38 yr old Steven Wyatt with a history of NICM (nor cors Oct 2014), chronic systolic HF ECHO 01/5955 EF 20% on chronic home milrinone via PICC, morbid obesity, OSA on BIPAP, HTN, CKD uncontrolled DM, and HLD. He has been followed closely in HF clinic chronic home Milrinone 0.375 mcg , recent d/c from hospital on 05/01/2014  Echo 4/15 EF 35% (?) - very poor windows   Admitted (5/25) for CP and SOB. Troponin's normal, pro-BNP slightly elevated 2056, Hgb 7.9 and Cr 1.64. On admission his HR 110-120 with PVC's. Received 2 PRBCs. Having frequent PVCs that he notices. Cr trending up. Co-ox pending.  Cr 1.6<1.64<1.92<2.2   Objective:   Weight Range:  Vital Signs:   Temp:  [97.6 F (36.4 C)-97.9 F (36.6 C)] 97.7 F (36.5 C) (05/27 0452) Pulse Rate:  [92-97] 94 (05/27 0452) Resp:  [16-18] 17 (05/27 0452) BP: (105-111)/(68-73) 105/70 mmHg (05/27 0452) SpO2:  [94 %-99 %] 99 % (05/27 0452) Weight:  [349 lb 3.2 oz (158.396 kg)] 349 lb 3.2 oz (158.396 kg) (05/27 0500) Last BM Date: 05/15/14  Weight change: Filed Weights   05/15/14 1700 05/16/14 0558 05/17/14 0500  Weight: 344 lb 3.2 oz (156.128 kg) 347 lb 6.4 oz (157.58 kg) 349 lb 3.2 oz (158.396 kg)    Intake/Output:   Intake/Output Summary (Last 24 hours) at 05/17/14 0919 Last data filed at 05/17/14 3875  Gross per 24 hour  Intake 2264.53 ml  Output   1225 ml  Net 1039.53 ml     Physical Exam: General: Chronically ill appearing. No resp difficulty; sitting on side of bed HEENT: normal  Neck: Thick. JVP difficult to assess d/t body habitus but appears flat; Carotids 2+ bilaterally; no bruits. No lymphadenopathy or thryomegaly appreciated.  Cor: PMI nonpalpable. Regular rate & rhythm. No rubs, gallops or murmurs.  Lungs: clear  Abdomen: obese soft, nontender, nondistended.Good bowel sounds.  Extremities: no cyanosis, clubbing, rash. LUE double lumen PICC. Chronic venous stasis changes;  no edema Neuro: alert & orientedx3, cranial nerves grossly intact. Moves all 4 extremities w/o difficulty. Affect pleasant   Telemetry: ST 100 with PVCs  Labs: Basic Metabolic Panel:  Recent Labs Lab 05/14/14 0010 05/14/14 0039 05/15/14 1200 05/16/14 0536 05/17/14 0600  NA 133* 136* 138 138 133*  K 2.8* 2.6* 3.0* 3.9 4.2  CL 90* 92* 93* 96 93*  CO2 27  --  28 27 26   GLUCOSE 157* 160* 128* 115* 125*  BUN 30* 28* 29* 34* 39*  CREATININE 1.53* 1.60* 1.64* 1.92* 2.20*  CALCIUM 8.8  --  9.1 9.2 9.2  MG  --   --   --   --  2.3    Liver Function Tests:  Recent Labs Lab 05/14/14 0010 05/15/14 1200  AST 42* 31  ALT 42 34  ALKPHOS 91 80  BILITOT 2.7* 2.7*  PROT 8.1 7.8  ALBUMIN 3.6 3.6   No results found for this basename: LIPASE, AMYLASE,  in the last 168 hours No results found for this basename: AMMONIA,  in the last 168 hours  CBC:  Recent Labs Lab 05/14/14 0010 05/14/14 0039 05/15/14 1200 05/16/14 0536  WBC 4.9  --  6.4  --   NEUTROABS 3.9  --  5.2  --   HGB 7.9* 10.5* 8.2* 9.2*  HCT 25.1* 31.0* 26.6* 28.5*  MCV 78.9  --  79.4  --   PLT 366  --  404*  --  Cardiac Enzymes:  Recent Labs Lab 05/15/14 1840 05/15/14 2320 05/16/14 0536  TROPONINI <0.30 <0.30 <0.30    BNP: BNP (last 3 results)  Recent Labs  04/29/14 2305 05/14/14 0010 05/15/14 1200  PROBNP 960.7* 1806.0* 2056.0*      Imaging: Dg Chest 2 View  05/15/2014   CLINICAL DATA:  CHEST PAIN SHORTNESS OF BREATH  EXAM: CHEST - 2 VIEW  COMPARISON:  the previous day's study  FINDINGS: Right arm PICC line remains with its tip in the mid SVC. Hazy perihilar opacities bilaterally, stable since studies back to 04/22/2014. Moderate cardiomegaly as before. . No effusion. Visualized skeletal structures are unremarkable.  IMPRESSION: Stable cardiomegaly and hazy perihilar opacities.   Electronically Signed   By: Oley Balmaniel  Hassell M.D.   On: 05/15/2014 11:53     Medications:     Scheduled  Medications: . aspirin EC  81 mg Oral Daily  . atorvastatin  80 mg Oral QPC supper  . carvedilol  3.125 mg Oral BID WC  . digoxin  0.125 mg Oral Daily  . enoxaparin (LOVENOX) injection  40 mg Subcutaneous Q24H  . furosemide  20 mg Intravenous Once  . hydrALAZINE  25 mg Oral 3 times per day  . insulin aspart  0-15 Units Subcutaneous TID WC  . insulin aspart  0-5 Units Subcutaneous QHS  . insulin glargine  25 Units Subcutaneous QHS  . isosorbide mononitrate  60 mg Oral Daily  . loratadine  10 mg Oral Daily  . magnesium oxide  400 mg Oral BID  . pantoprazole  40 mg Oral Daily  . sodium chloride  3 mL Intravenous Q12H    Infusions: . milrinone 0.375 mcg/kg/min (05/17/14 0453)    PRN Medications: sodium chloride, acetaminophen, albuterol, ALPRAZolam, clonazePAM, nitroGLYCERIN, ondansetron (ZOFRAN) IV, sodium chloride, sodium chloride, traMADol, zolpidem   Assessment:   1) A/C Systolic HF 2) NICM - EF 30-35% (03/2014) 3) CP 4) HTN 5) DM2 6) OSA 7) CKD, stage III 8) Hypokalemia 9) Anemia  Plan/Discussion:    Stable overnight. Received 2 PRBCs yesterday for chronic anemia with lasix in between. Will continue to follow will need full work-up on outpatient side.   Volume status appears at baseline, however weight and Cr continue to increase. Co-ox stable. Will hold diuretics today and continue to follow.   CP much improved and likely related to PVCs. Will start amiodarone 400 mg PO BID to hopefully help suppress PVCs. Patient had holter monitor placed and thought he returned to the office, but they do not have. Asked him to please check with wife to see if she returned and if not will need to be returned when he is out of the hopsital to assess PVC burden. CP better with Ultram and PPI. If needs Percocet on OP side will need to be referred to pain clinic.   ECHO with contrast pending.   Hopefully home tomorrow.   Length of Stay: 2  Aundria Rudli B Cosgrove NP-C 05/17/2014, 9:19  AM  Advanced Heart Failure Team Pager [phone] (M-F; 7a - 4p)  Please contact [facility] for night-coverage after hours (4p -7a ) and weekends on [email or web address]   Patient seen and examined with Ulla PotashAli Cosgrove, NP. We discussed all aspects of the encounter. I agree with the assessment and plan as stated above. Feeling better but still c/o palpitations. Tele reviewed and has frequent PVCs (currently wearing Holter monitor to quantify). Will start amio to see if this helps control symptoms. Volume status looks good but creatinine  on rise (co-ox good) so likely dry. Will hold all diuretics. If renal function better tomorrow can go home. Echo pending. H/H better after transfusion. Consider outpatient heme w/u.   Bevelyn Buckles Bensimhon,MD 5:25 PM

## 2014-05-17 NOTE — Progress Notes (Addendum)
Discussed carboxyhgb results with CHF team. They are OK with O2 sat, but want stat CBC to recheck Hgb. Discussed with nursing earlier to call lab to obtain stat - awaiting results. Discussed pt with blood bank - he is already type&screened from 2 days prior. Have ordered to prepare 2 units on standby in case transfusion is needed this evening. Leonid Manus PA-C  Addendum: nursing called to clarify lovenox - will d/c order for this given possible anemia and add SCDs. This can be re-added in AM if felt appropriate. Alecxis Baltzell PA-C

## 2014-05-17 NOTE — Progress Notes (Signed)
)  730 sound asleep with cpap mask on . On low fowlers head elevation . Breathing easy and  regular .

## 2014-05-18 ENCOUNTER — Encounter: Payer: Self-pay | Admitting: Internal Medicine

## 2014-05-18 DIAGNOSIS — I5022 Chronic systolic (congestive) heart failure: Secondary | ICD-10-CM

## 2014-05-18 LAB — CARBOXYHEMOGLOBIN
CARBOXYHEMOGLOBIN: 1.6 % — AB (ref 0.5–1.5)
METHEMOGLOBIN: 0.9 % (ref 0.0–1.5)
O2 SAT: 49.9 %
Total hemoglobin: 9.5 g/dL — ABNORMAL LOW (ref 13.5–18.0)

## 2014-05-18 LAB — CBC
HCT: 29 % — ABNORMAL LOW (ref 39.0–52.0)
Hemoglobin: 9 g/dL — ABNORMAL LOW (ref 13.0–17.0)
MCH: 24.8 pg — ABNORMAL LOW (ref 26.0–34.0)
MCHC: 31 g/dL (ref 30.0–36.0)
MCV: 79.9 fL (ref 78.0–100.0)
PLATELETS: 386 10*3/uL (ref 150–400)
RBC: 3.63 MIL/uL — AB (ref 4.22–5.81)
RDW: 17.4 % — AB (ref 11.5–15.5)
WBC: 4.5 10*3/uL (ref 4.0–10.5)

## 2014-05-18 LAB — GLUCOSE, CAPILLARY
Glucose-Capillary: 120 mg/dL — ABNORMAL HIGH (ref 70–99)
Glucose-Capillary: 118 mg/dL — ABNORMAL HIGH (ref 70–99)

## 2014-05-18 LAB — BASIC METABOLIC PANEL
BUN: 42 mg/dL — ABNORMAL HIGH (ref 6–23)
CO2: 27 meq/L (ref 19–32)
Calcium: 9.4 mg/dL (ref 8.4–10.5)
Chloride: 93 mEq/L — ABNORMAL LOW (ref 96–112)
Creatinine, Ser: 2.09 mg/dL — ABNORMAL HIGH (ref 0.50–1.35)
GFR calc Af Amer: 46 mL/min — ABNORMAL LOW (ref >90)
GFR, EST NON AFRICAN AMERICAN: 39 mL/min — AB (ref >90)
GLUCOSE: 122 mg/dL — AB (ref 70–99)
POTASSIUM: 4.3 meq/L (ref 3.7–5.3)
SODIUM: 132 meq/L — AB (ref 137–147)

## 2014-05-18 MED ORDER — ISOSORBIDE MONONITRATE ER 60 MG PO TB24: 60.0000 mg | ORAL_TABLET | Freq: Every day | ORAL | Status: AC

## 2014-05-18 MED ORDER — PANTOPRAZOLE SODIUM 40 MG PO TBEC: 40.0000 mg | DELAYED_RELEASE_TABLET | Freq: Every day | ORAL | Status: AC

## 2014-05-18 MED ORDER — AMIODARONE HCL 400 MG PO TABS: 400.0000 mg | ORAL_TABLET | Freq: Two times a day (BID) | ORAL | Status: DC

## 2014-05-18 NOTE — Progress Notes (Signed)
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 He has been followed closely in HF clinic chronic home Milrinone 0.375 mcg , recent d/c from hospital on 05/01/2014  Echo 4/15 EF 35% (?) - very poor windows   Admitted (5/25) for CP and SOB. Troponin's normal, pro-BNP slightly elevated 2056, Hgb 7.9 and Cr 1.64. On admission his HR 110-120 with PVC's. Received 2 PRBCs.   Diuretics held yesterday. Started on amiodarone for PVCs which has helped. Denies CP currently however still having bouts that are treated with tramadol. Denies SOB or orthopnea.   Cr 1.6<1.64<1.92<2.2<2.09   Objective:   Weight Range:  Vital Signs:   Temp:  [97.2 F (36.2 C)-98.7 F (37.1 C)] 97.7 F (36.5 C) (05/28 0550) Pulse Rate:  [89-94] 89 (05/28 0550) Resp:  [18-20] 20 (05/28 0550) BP: (100-104)/(55-70) 103/68 mmHg (05/28 0550) SpO2:  [95 %-99 %] 96 % (05/28 0550) Weight:  [355 lb 4.8 oz (161.163 kg)] 355 lb 4.8 oz (161.163 kg) (05/28 0500) Last BM Date: 05/15/14  Weight change: Filed Weights   05/16/14 0558 05/17/14 0500 05/18/14 0500  Weight: 347 lb 6.4 oz (157.58 kg) 349 lb 3.2 oz (158.396 kg) 355 lb 4.8 oz (161.163 kg)    Intake/Output:   Intake/Output Summary (Last 24 hours) at 05/18/14 1028 Last data filed at 05/18/14 1013  Gross per 24 hour  Intake 640.28 ml  Output    300 ml  Net 340.28 ml     Physical Exam: General: Chronically ill appearing. No resp difficulty; sitting on side of bed HEENT: normal  Neck: Thick. JVP difficult to assess d/t body habitus but appears flat; Carotids 2+ bilaterally; no bruits. No lymphadenopathy or thryomegaly appreciated.  Cor: PMI nonpalpable. Regular rate & rhythm. No rubs, gallops or murmurs.  Lungs: clear  Abdomen: obese soft, nontender,  nondistended.Good bowel sounds.  Extremities: no cyanosis, clubbing, rash. LUE double lumen PICC. Chronic venous stasis changes; no edema Neuro: alert & orientedx3, cranial nerves grossly intact. Moves all 4 extremities w/o difficulty. Affect pleasant   Telemetry: ST 100 with minimal PVCs  Labs: Basic Metabolic Panel:  Recent Labs Lab 05/14/14 0010 05/14/14 0039 05/15/14 1200 05/16/14 0536 05/17/14 0600 05/18/14 0530  NA 133* 136* 138 138 133* 132*  K 2.8* 2.6* 3.0* 3.9 4.2 4.3  CL 90* 92* 93* 96 93* 93*  CO2 27  --  28 27 26 27   GLUCOSE 157* 160* 128* 115* 125* 122*  BUN 30* 28* 29* 34* 39* 42*  CREATININE 1.53* 1.60* 1.64* 1.92* 2.20* 2.09*  CALCIUM 8.8  --  9.1 9.2 9.2 9.4  MG  --   --   --   --  2.3  --     Liver Function Tests:  Recent Labs Lab 05/14/14 0010 05/15/14 1200  AST 42* 31  ALT 42 34  ALKPHOS 91 80  BILITOT 2.7* 2.7*  PROT 8.1 7.8  ALBUMIN 3.6 3.6   No results found for this basename: LIPASE, AMYLASE,  in the last 168 hours No results found for this basename: AMMONIA,  in the last 168 hours  CBC:  Recent Labs Lab 05/14/14 0010 05/14/14 0039 05/15/14 1200 05/16/14 0536 05/17/14 2058 05/18/14 0530  WBC 4.9  --  6.4  --  5.2 4.5  NEUTROABS 3.9  --  5.2  --   --   --   HGB 7.9* 10.5* 8.2* 9.2* 9.4* 9.0*  HCT 25.1* 31.0* 26.6* 28.5* 29.0* 29.0*  MCV 78.9  --  79.4  --  79.2 79.9  PLT 366  --  404*  --  385 386    Cardiac Enzymes:  Recent Labs Lab 05/15/14 1840 05/15/14 2320 05/16/14 0536  TROPONINI <0.30 <0.30 <0.30    BNP: BNP (last 3 results)  Recent Labs  04/29/14 2305 05/14/14 0010 05/15/14 1200  PROBNP 960.7* 1806.0* 2056.0*      Imaging: No results found.   Medications:     Scheduled Medications: . amiodarone  400 mg Oral BID  . antiseptic oral rinse  15 mL Mouth Rinse BID  . aspirin EC  81 mg Oral Daily  . atorvastatin  80 mg Oral QPC supper  . carvedilol  3.125 mg Oral BID WC  . digoxin  0.125 mg  Oral Daily  . hydrALAZINE  25 mg Oral 3 times per day  . insulin aspart  0-15 Units Subcutaneous TID WC  . insulin aspart  0-5 Units Subcutaneous QHS  . insulin glargine  25 Units Subcutaneous QHS  . isosorbide mononitrate  60 mg Oral Daily  . loratadine  10 mg Oral Daily  . magnesium oxide  400 mg Oral BID  . pantoprazole  40 mg Oral Daily  . sodium chloride  3 mL Intravenous Q12H    Infusions: . milrinone 0.375 mcg/kg/min (05/18/14 0400)    PRN Medications: sodium chloride, acetaminophen, albuterol, ALPRAZolam, clonazePAM, nitroGLYCERIN, ondansetron (ZOFRAN) IV, sodium chloride, sodium chloride, traMADol, zolpidem   Assessment:   1) A/C Systolic HF 2) NICM - EF 30-35% (03/2014) 3) CP 4) HTN 5) DM2 6) OSA 7) CKD, stage III 8) Hypokalemia 9) Anemia  Plan/Discussion:    Stable overnight. Received 2 PRBCs on admission for chronic anemia. Will continue to follow will need full work-up on outpatient side.   Volume status appears at baseline, however weight continues to rise. He does not appear to have any volume on board. Cr improving but still above baseline likely related to being dry. Will send home today on home bumex dose starting tomorrow. CLose follow up in clinic next week.   Still having bouts of CP that improves with tramadol. Will send with script but if needs more will need to follow up with PCP or pain clinic. PVCs much improved with amiodarone. Will continue 400 mg BID on OP side and then taper down.  Length of Stay: 3  Aundria Rud NP-C 05/18/2014, 10:28 AM  Advanced Heart Failure Team Pager [phone] (M-F; 7a - 4p)  Please contact [facility] for night-coverage after hours (4p -7a ) and weekends on [email or web address]   Patient seen and examined with Ulla Potash, NP. We discussed all aspects of the encounter. I agree with the assessment and plan as stated above.   Weight up but he looks dry. Renal function improving. PVCs much improved with amio. Can go home  today. Hold diuretics today and restart tomorrow. Will see him back in clinic to decide about CRT-D. Continue milrinone. Tramadol for CP.   Bevelyn Buckles Bensimhon,MD 12:49 PM

## 2014-05-18 NOTE — ED Provider Notes (Signed)
Medical screening examination/treatment/procedure(s) were performed by non-physician practitioner and as supervising physician I was immediately available for consultation/collaboration.   EKG Interpretation   Date/Time:  Monday May 15 2014 09:57:36 EDT Ventricular Rate:  109 PR Interval:  174 QRS Duration: 138 QT Interval:  354 QTC Calculation: 476 R Axis:   -129 Text Interpretation:  Sinus tachycardia with occasional Premature  ventricular complexes Right superior axis deviation Non-specific  intra-ventricular conduction block Abnormal ECG No significant change  since last tracing Confirmed by Karma Ganja  MD, MARTHA [phone]) on 05/15/2014  1:22:07 PM       Ethelda Chick, MD 05/18/14 1616

## 2014-05-18 NOTE — Progress Notes (Signed)
Pt currently on CPAP; tolerating well. RT will continue to monitor.

## 2014-05-18 NOTE — Discharge Summary (Signed)
Advanced Heart Failure Team  Discharge Summary   Patient ID: Steven CarotaWayne O Tien Jr. MRN: [id number], DOB/AGE: Feb 23, 1976 38 y.o. Admit date: 05/15/2014 D/C date:     05/19/2014   Primary Discharge Diagnoses:  1) Chest pain 2) PVCs  Secondary Discharge Diagnoses:  1) Chronic systolic heart failure - EF 30-35% (03/2014) - Discharge weight 355 lbs 2) HTN 3) DM2 4) OSA - on CPAP 5) CKD, stage III 6) Hypokalemia 7) Anemia  Hospital Course:  [name] is a 38 yr old AAM with a history of NICM (nor cors Oct 2014), chronic systolic HF on chronic home milrinone via PICC, morbid obesity, OSA on BIPAP, HTN, CKD, uncontrolled DM, and HLD.   He has been admitted multiple times in the past few months for HF and CP. He presented to the ED on 5/25 with CP and SOB. Pertinent labs were troponin normal, pro-BNP 2056, Hgb 7.9 and Cr 1.64. He was admitted to the hospital and started on IV diuretics. His Cr began to rise and by exam the patient appeared to be dry even though his weight was up more than usual and his diuretics were stopped. During his stay it was noted that he had frequent PVCs that could be contributing to his CP and he was started on amiodarone PO. The amiodarone helped suppress the PVCs and the patient reported having less CP. Multiple discussions took place with patient about pain medications and that if wanted to have Percocet that he would need a referral to Pain Management. We provided him with one script of Tramadol 100 mg #30 which he reported helping.  Of note the patient had a Hgb of 7.9 when he presented to the hospital and he was treated with 2 PRBCs. His Hgb rose to 9.4 and remained stable in the 9's. He will need full work-up on the OP side including EGD and colonoscopy.   On day of discharge the patient was felt to be stable to go home. His VSS and will be followed in the HF clinic next week to discuss possible CRT-D. It was discussed with the patient about the need to return the 48  hour holter monitor when he returned home and to also continue to wear his LifeVest. He will go home on Amiodarone 400 mg BID and then at OP visit consider titrating down to 200 mg BID. He will need a BMET next week to assess renal function.   Discharge Weight Range: 355-357 lbs Discharge Vitals: Blood pressure 106/64, pulse 89, temperature 97.7 F (36.5 C), temperature source Oral, resp. rate 20, height 5\' 8"  (1.727 m), weight 355 lb 4.8 oz (161.163 kg), SpO2 96.00%.  Labs: Lab Results  Component Value Date   WBC 4.5 05/18/2014   HGB 9.0* 05/18/2014   HCT 29.0* 05/18/2014   MCV 79.9 05/18/2014   PLT 386 05/18/2014    Recent Labs Lab 05/15/14 1200  05/18/14 0530  NA 138  < > 132*  K 3.0*  < > 4.3  CL 93*  < > 93*  CO2 28  < > 27  BUN 29*  < > 42*  CREATININE 1.64*  < > 2.09*  CALCIUM 9.1  < > 9.4  PROT 7.8  --   --   BILITOT 2.7*  --   --   ALKPHOS 80  --   --   ALT 34  --   --   AST 31  --   --   GLUCOSE 128*  < > 122*  < > =  values in this interval not displayed. No results found for this basename: CHOL,  HDL,  LDLCALC,  TRIG   BNP (last 3 results)  Recent Labs  04/29/14 2305 05/14/14 0010 05/15/14 1200  PROBNP 960.7* 1806.0* 2056.0*    Diagnostic Studies/Procedures   No results found.  Discharge Medications     Medication List         albuterol 108 (90 BASE) MCG/ACT inhaler  Commonly known as:  PROVENTIL HFA;VENTOLIN HFA  Inhale 2 puffs into the lungs daily as needed for wheezing or shortness of breath.     amiodarone 400 MG tablet  Commonly known as:  PACERONE  Take 1 tablet (400 mg total) by mouth 2 (two) times daily.     aspirin EC 81 MG tablet  Take 81 mg by mouth daily.     atorvastatin 80 MG tablet  Commonly known as:  LIPITOR  Take 80 mg by mouth daily.     bumetanide 2 MG tablet  Commonly known as:  BUMEX  Take 2 tablets (4 mg total) by mouth 2 (two) times daily.     carvedilol 3.125 MG tablet  Commonly known as:  COREG  Take 1 tablet  (3.125 mg total) by mouth 2 (two) times daily.     clonazePAM 0.5 MG tablet  Commonly known as:  KLONOPIN  Take 1 tablet (0.5 mg total) by mouth 2 (two) times daily as needed for anxiety.     digoxin 0.125 MG tablet  Commonly known as:  LANOXIN  Take 1 tablet (0.125 mg total) by mouth daily.     hydrALAZINE 25 MG tablet  Commonly known as:  APRESOLINE  Take 1 tablet (25 mg total) by mouth every 8 (eight) hours.     insulin aspart 100 UNIT/ML FlexPen  Commonly known as:  NOVOLOG FLEXPEN  Inject 1-15 Units into the skin 3 (three) times daily with meals. Sliding scale     insulin glargine 100 UNIT/ML injection  Commonly known as:  LANTUS  Inject 0.25 mLs (25 Units total) into the skin at bedtime.     isosorbide mononitrate 60 MG 24 hr tablet  Commonly known as:  IMDUR  Take 1 tablet (60 mg total) by mouth daily.     loratadine 10 MG tablet  Commonly known as:  CLARITIN  Take 1 tablet (10 mg total) by mouth daily.     magnesium oxide 400 MG tablet  Commonly known as:  MAG-OX  Take 1 tablet (400 mg total) by mouth 2 (two) times daily.     milrinone 20 MG/100ML Soln infusion  Commonly known as:  PRIMACOR  Inject 62.7375 mcg/min into the vein continuous.     oxyCODONE-acetaminophen 5-325 MG per tablet  Commonly known as:  PERCOCET  Take 2 tablets by mouth every 4 (four) hours as needed.     pantoprazole 40 MG tablet  Commonly known as:  PROTONIX  Take 1 tablet (40 mg total) by mouth daily.     potassium chloride SA 20 MEQ tablet  Commonly known as:  K-DUR,KLOR-CON  Take 2 tablets (40 mEq total) by mouth 2 (two) times daily.     traMADol 50 MG tablet  Commonly known as:  ULTRAM  Take 2 tablets (100 mg total) by mouth every 6 (six) hours as needed for moderate pain.        Disposition   The patient will be discharged in stable condition to home. Discharge Instructions   (HEART FAILURE PATIENTS) Call MD:  Anytime you have any of the following symptoms: 1) 3 pound  weight gain in 24 hours or 5 pounds in 1 week 2) shortness of breath, with or without a dry hacking cough 3) swelling in the hands, feet or stomach 4) if you have to sleep on extra pillows at night in order to breathe.    Complete by:  As directed      Beta Blocker already ordered    Complete by:  As directed      Continue PICC at discharge    Complete by:  As directed   Flush PICC line per home health policy:  Yes     Contraindication to ACEI at discharge    Complete by:  As directed      Diet - low sodium heart healthy    Complete by:  As directed      Discharge instructions    Complete by:  As directed   Please bring all your medications to your visit.     Heart Failure patients record your daily weight using the same scale at the same time of day    Complete by:  As directed      Increase activity slowly    Complete by:  As directed      STOP any activity that causes chest pain, shortness of breath, dizziness, sweating, or exessive weakness    Complete by:  As directed           Follow-up Information   Follow up with [facility] On 05/25/2014. (@ 11:20 am; gate code 0400; Please bring all medications to your visit or you may not be seen )    Specialty:  Cardiology   Contact information:   [address] [id number] [place] [address] [phone]        Duration of Discharge Encounter: Greater than 35 minutes   Signed, Aundria Rud NP-C 05/19/2014, 2:43 PM  Patient seen and examined with Ulla Potash, NP. We discussed all aspects of the encounter. I agree with the assessment and plan as stated above. Overall he diuresed 55 pounds.   Bevelyn Buckles Bensimhon,MD 1:38 PM

## 2014-05-18 NOTE — Plan of Care (Signed)
Problem: Phase I Progression Outcomes Goal: EF % per last Echo/documented,Core Reminder form on chart Outcome: Completed/Met Date Met:  05/18/14 EF35%(03-30-14)

## 2014-05-18 NOTE — Progress Notes (Signed)
Patient given discharge instructions and all questions answered.  Home Health came and hooked patient up to home milrinone.  Patient discharged with all belongings.

## 2014-05-18 NOTE — Progress Notes (Signed)
Pt had episode of CP with pain scale of 8/10, pt c/o palpitations, did an ECG which showed NSR with occasional PVC's and a LBBB, notified MD and they are aware, no new orders were written, gave Tramadol for the pain, will continue to monitor, thanks Lavonda Jumbo RN.

## 2014-05-19 LAB — TYPE AND SCREEN
ABO/RH(D): O POS
Antibody Screen: NEGATIVE
UNIT DIVISION: 0
Unit division: 0
Unit division: 0
Unit division: 0

## 2014-05-20 NOTE — Discharge Summary (Signed)
Patient seen and examined with Ulla Potash, NP. We discussed all aspects of the encounter. I agree with the assessment and plan as stated above. I have edited the note to reflect my changes.  Bevelyn Buckles Bensimhon,MD 1:38 PM

## 2014-05-23 ENCOUNTER — Emergency Department (HOSPITAL_COMMUNITY): Payer: Medicare Other

## 2014-05-23 ENCOUNTER — Encounter (HOSPITAL_COMMUNITY): Payer: Self-pay | Admitting: Emergency Medicine

## 2014-05-23 ENCOUNTER — Emergency Department (HOSPITAL_COMMUNITY)
Admission: EM | Admit: 2014-05-23 | Discharge: 2014-05-23 | Disposition: A | Discharge status: Home / Self Care | Payer: Medicare Other | Attending: Emergency Medicine | Admitting: Emergency Medicine

## 2014-05-23 DIAGNOSIS — Z8619 Personal history of other infectious and parasitic diseases: Secondary | ICD-10-CM | POA: Insufficient documentation

## 2014-05-23 DIAGNOSIS — Z9981 Dependence on supplemental oxygen: Secondary | ICD-10-CM | POA: Insufficient documentation

## 2014-05-23 DIAGNOSIS — I129 Hypertensive chronic kidney disease with stage 1 through stage 4 chronic kidney disease, or unspecified chronic kidney disease: Secondary | ICD-10-CM | POA: Insufficient documentation

## 2014-05-23 DIAGNOSIS — E876 Hypokalemia: Secondary | ICD-10-CM | POA: Insufficient documentation

## 2014-05-23 DIAGNOSIS — I5022 Chronic systolic (congestive) heart failure: Secondary | ICD-10-CM | POA: Insufficient documentation

## 2014-05-23 DIAGNOSIS — N183 Chronic kidney disease, stage 3 unspecified: Secondary | ICD-10-CM | POA: Insufficient documentation

## 2014-05-23 DIAGNOSIS — Z862 Personal history of diseases of the blood and blood-forming organs and certain disorders involving the immune mechanism: Secondary | ICD-10-CM | POA: Insufficient documentation

## 2014-05-23 DIAGNOSIS — R609 Edema, unspecified: Secondary | ICD-10-CM | POA: Insufficient documentation

## 2014-05-23 DIAGNOSIS — G4730 Sleep apnea, unspecified: Secondary | ICD-10-CM | POA: Insufficient documentation

## 2014-05-23 DIAGNOSIS — R1011 Right upper quadrant pain: Secondary | ICD-10-CM | POA: Insufficient documentation

## 2014-05-23 DIAGNOSIS — E785 Hyperlipidemia, unspecified: Secondary | ICD-10-CM | POA: Insufficient documentation

## 2014-05-23 DIAGNOSIS — R109 Unspecified abdominal pain: Secondary | ICD-10-CM

## 2014-05-23 DIAGNOSIS — E871 Hypo-osmolality and hyponatremia: Secondary | ICD-10-CM | POA: Insufficient documentation

## 2014-05-23 DIAGNOSIS — Z7982 Long term (current) use of aspirin: Secondary | ICD-10-CM | POA: Insufficient documentation

## 2014-05-23 DIAGNOSIS — Z794 Long term (current) use of insulin: Secondary | ICD-10-CM | POA: Insufficient documentation

## 2014-05-23 DIAGNOSIS — Z79899 Other long term (current) drug therapy: Secondary | ICD-10-CM | POA: Insufficient documentation

## 2014-05-23 DIAGNOSIS — J45901 Unspecified asthma with (acute) exacerbation: Secondary | ICD-10-CM | POA: Insufficient documentation

## 2014-05-23 LAB — CBC WITH DIFFERENTIAL/PLATELET
BASOS ABS: 0 10*3/uL (ref 0.0–0.1)
Basophils Relative: 0 % (ref 0–1)
Eosinophils Absolute: 0.1 10*3/uL (ref 0.0–0.7)
Eosinophils Relative: 1 % (ref 0–5)
HCT: 29 % — ABNORMAL LOW (ref 39.0–52.0)
Hemoglobin: 9.2 g/dL — ABNORMAL LOW (ref 13.0–17.0)
LYMPHS ABS: 0.8 10*3/uL (ref 0.7–4.0)
LYMPHS PCT: 12 % (ref 12–46)
MCH: 24.7 pg — ABNORMAL LOW (ref 26.0–34.0)
MCHC: 31.7 g/dL (ref 30.0–36.0)
MCV: 77.7 fL — ABNORMAL LOW (ref 78.0–100.0)
Monocytes Absolute: 0.4 10*3/uL (ref 0.1–1.0)
Monocytes Relative: 5 % (ref 3–12)
NEUTROS PCT: 82 % — AB (ref 43–77)
Neutro Abs: 5.4 10*3/uL (ref 1.7–7.7)
PLATELETS: 364 10*3/uL (ref 150–400)
RBC: 3.73 MIL/uL — ABNORMAL LOW (ref 4.22–5.81)
RDW: 17.3 % — ABNORMAL HIGH (ref 11.5–15.5)
WBC: 6.7 10*3/uL (ref 4.0–10.5)

## 2014-05-23 LAB — COMPREHENSIVE METABOLIC PANEL
ALBUMIN: 3.7 g/dL (ref 3.5–5.2)
ALT: 72 U/L — AB (ref 0–53)
AST: 88 U/L — AB (ref 0–37)
Alkaline Phosphatase: 132 U/L — ABNORMAL HIGH (ref 39–117)
BUN: 27 mg/dL — ABNORMAL HIGH (ref 6–23)
CALCIUM: 9.1 mg/dL (ref 8.4–10.5)
CHLORIDE: 93 meq/L — AB (ref 96–112)
CO2: 26 mEq/L (ref 19–32)
Creatinine, Ser: 1.48 mg/dL — ABNORMAL HIGH (ref 0.50–1.35)
GFR calc Af Amer: 69 mL/min — ABNORMAL LOW (ref >90)
GFR, EST NON AFRICAN AMERICAN: 60 mL/min — AB (ref >90)
Glucose, Bld: 116 mg/dL — ABNORMAL HIGH (ref 70–99)
Potassium: 3.3 mEq/L — ABNORMAL LOW (ref 3.7–5.3)
SODIUM: 135 meq/L — AB (ref 137–147)
Total Bilirubin: 3.9 mg/dL — ABNORMAL HIGH (ref 0.3–1.2)
Total Protein: 8.2 g/dL (ref 6.0–8.3)

## 2014-05-23 LAB — PRO B NATRIURETIC PEPTIDE: Pro B Natriuretic peptide (BNP): 1770 pg/mL — ABNORMAL HIGH (ref 0–125)

## 2014-05-23 LAB — TROPONIN I

## 2014-05-23 LAB — LIPASE, BLOOD: Lipase: 80 U/L — ABNORMAL HIGH (ref 11–59)

## 2014-05-23 MED ORDER — HYDROMORPHONE HCL PF 1 MG/ML IJ SOLN
1.0000 mg | Freq: Once | INTRAMUSCULAR | Status: AC
  Administered 2014-05-23: 1 mg via INTRAVENOUS
  Filled 2014-05-23: qty 1

## 2014-05-23 MED ORDER — OXYCODONE-ACETAMINOPHEN 5-325 MG PO TABS: 1.0000 | ORAL_TABLET | Freq: Four times a day (QID) | ORAL | Status: DC | PRN

## 2014-05-23 MED ORDER — LORAZEPAM 2 MG/ML IJ SOLN
1.0000 mg | Freq: Once | INTRAMUSCULAR | Status: AC
  Administered 2014-05-23: 1 mg via INTRAVENOUS
  Filled 2014-05-23: qty 1

## 2014-05-23 MED ORDER — ONDANSETRON HCL 4 MG/2ML IJ SOLN
4.0000 mg | Freq: Once | INTRAMUSCULAR | Status: AC
  Administered 2014-05-23: 4 mg via INTRAVENOUS
  Filled 2014-05-23: qty 2

## 2014-05-23 MED ORDER — PROMETHAZINE HCL 25 MG PO TABS: 25.0000 mg | ORAL_TABLET | Freq: Four times a day (QID) | ORAL | Status: DC | PRN

## 2014-05-23 NOTE — ED Notes (Signed)
Pt. Has finished 1 container of po contrast.

## 2014-05-23 NOTE — ED Notes (Signed)
Encourage pt. To drink contrast

## 2014-05-23 NOTE — ED Notes (Signed)
Reported to [facility],  Pt. 's abdominal pain is increasing.

## 2014-05-23 NOTE — ED Notes (Signed)
Theron Arista, PA-C, at the bedside.

## 2014-05-23 NOTE — ED Provider Notes (Signed)
Medical screening examination/treatment/procedure(s) were conducted as a shared visit with non-physician practitioner(s) and myself.  I personally evaluated the patient during the encounter.   EKG Interpretation   Date/Time:  Tuesday May 23 2014 04:46:43 EDT Ventricular Rate:  97 PR Interval:  201 QRS Duration: 144 QT Interval:  397 QTC Calculation: 504 R Axis:   -63 Text Interpretation:  Sinus rhythm Ventricular trigeminy Borderline  prolonged PR interval Left bundle branch block increased pvcs from prior  Confirmed by Rajanae Mantia  MD, Bryonna Sundby ([id number]) on 05/23/2014 5:30:57 AM     Pt seen with PA.  Pt is medically complex, h/o CHF on home milrinone, HTN, NICM.  Normal cath per report 2014.  He presents with epigastic pain, nausea.  He has had some chest pain and palpitations here in the ER.  Pt appears very anxious.  Plan is for xray, labs, RUQ [place].  If at baseline, will be able to f/u with his cardiologist and pcp  Olivia Mackie, MD 05/23/14 [phone]

## 2014-05-23 NOTE — ED Notes (Signed)
Asked to have blood drawn from PICC line

## 2014-05-23 NOTE — Discharge Instructions (Signed)
Return here as needed.  Followup with your primary care doctor your CT scan did not show any significant abnormalities at this time

## 2014-05-23 NOTE — ED Provider Notes (Signed)
CSN: [id number]     Arrival date & time 05/23/14  0401 History   First MD Initiated Contact with Patient 05/23/14 0515     Chief Complaint  Patient presents with  . Abdominal Pain   HPI  History provided by the patient. Patient is a 38 year old African American male with history of hypertension, hyperlipidemia, diabetes, CAD and CHF who presents with complaints of abdominal pain. Patient reports having worsening upper abdominal pain yesterday. He states that he has had similar symptoms previously off-and-on. States she began having sharp pains especially after he ate his meals that radiated into the epigastric and chest area. He states that laying flat makes the symptoms much worse. He states that he has been using his home medications including his milrinone for which he as a picc line and felt that his baseline shortness of breath and CHF were doing well yesterday and he was up and able to move around more. When she began having severe pains early this morning however he feels that his anxiety has made his shortness of breath worse. He is abdominal pain feels better sitting forward but he feels worsening shortness of breath and occasional heart palpitations and "PVCs". He denies having any chest pain. He does report one episode of nausea and vomiting when he called EMS this morning. This was shortly after he tried drinking ginger ale which he thought might help for his symptoms. He did not use any other medications or treatment for symptoms. There's not been any associated fever, chills or sweats. He does report some recent issues of constipation but reports having a normal bowel movement yesterday. No diarrhea.    Past Medical History  Diagnosis Date  . Chronic systolic CHF (congestive heart failure)     a. on home milrinone - dose titrate to 0.310975mcg/kg/min 03/2014.  Marland Kitchen. HTN (hypertension)   . Morbid obesity with BMI of 50.0-59.9, adult   . Dyslipidemia   . CKD (chronic kidney disease), stage III    . Asthma 01/03/2014  . Hypokalemia 01/04/2014  . Hyponatremia 02/25/2014  . Microcytic anemia 01/04/2014  . 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 Normal coronary arteries- [facility]  . History of chicken pox   . Perirectal abscess 03/2014  . Allergic rhinitis, cause unspecified 04/29/2014  . Shortness of breath   . Anxiety   . Chronic bronchitis     "get it q yr" (05/15/2014)  . Sleep apnea     on Bi Pap  . Diabetes     "prediabetic" (05/15/2014)  . DKA (diabetic ketoacidoses) 02/25/2014   Past Surgical History  Procedure Laterality Date  . Incision and drainage abscess N/A 03/31/2014    Procedure: INCISION AND DRAINAGE  PERI-RECTAL ABSCESS;  Surgeon: Axel FillerArmando Ramirez, MD;  Location: [facility] OR;  Service: General;  Laterality: N/A;  . Cardiac catheterization  October 2014    [facility]; reportedly clean, no further details available   Family History  Problem Relation Age of Onset  . Hypertension Mother   . Diabetes Maternal Grandmother   . Liver disease Father    History  Substance Use Topics  . Smoking status: Never Smoker   . Smokeless tobacco: Never Used  . Alcohol Use: No    Review of Systems  Constitutional: Negative for fever.  Respiratory: Positive for shortness of breath. Negative for cough.   Cardiovascular: Positive for palpitations. Negative for chest pain.  Gastrointestinal: Positive for vomiting and abdominal pain. Negative for diarrhea, constipation  and blood in stool.  All other systems reviewed and are negative.     Allergies  Iodine and Shellfish allergy  Home Medications   Prior to Admission medications   Medication Sig Start Date End Date Taking? Authorizing Provider  albuterol (PROVENTIL HFA;VENTOLIN HFA) 108 (90 BASE) MCG/ACT inhaler Inhale 2 puffs into the lungs daily as needed for wheezing or shortness of breath. 01/08/14  Yes Wilburt Finlay, PA-C  amiodarone (PACERONE) 400 MG tablet Take 1 tablet  (400 mg total) by mouth 2 (two) times daily. 05/18/14  Yes Aundria Rud, NP  aspirin EC 81 MG tablet Take 81 mg by mouth daily.   Yes Historical Provider, MD  atorvastatin (LIPITOR) 80 MG tablet Take 80 mg by mouth daily. 01/08/14  Yes Wilburt Finlay, PA-C  bumetanide (BUMEX) 2 MG tablet Take 2 tablets (4 mg total) by mouth 2 (two) times daily. 01/16/14  Yes Amy D Clegg, NP  carvedilol (COREG) 3.125 MG tablet Take 1 tablet (3.125 mg total) by mouth 2 (two) times daily. 02/20/14  Yes Dolores Patty, MD  digoxin (LANOXIN) 0.125 MG tablet Take 1 tablet (0.125 mg total) by mouth daily. 01/16/14  Yes Amy D Clegg, NP  hydrALAZINE (APRESOLINE) 25 MG tablet Take 1 tablet (25 mg total) by mouth every 8 (eight) hours. 01/23/14  Yes Amy D Clegg, NP  insulin aspart (NOVOLOG FLEXPEN) 100 UNIT/ML FlexPen Inject 1-15 Units into the skin 3 (three) times daily with meals. Sliding scale 04/27/14  Yes Corwin Levins, MD  insulin glargine (LANTUS) 100 UNIT/ML injection Inject 0.25 mLs (25 Units total) into the skin at bedtime. 04/27/14  Yes Corwin Levins, MD  isosorbide mononitrate (IMDUR) 60 MG 24 hr tablet Take 1 tablet (60 mg total) by mouth daily. 05/18/14  Yes Aundria Rud, NP  loratadine (CLARITIN) 10 MG tablet Take 1 tablet (10 mg total) by mouth daily. 04/27/14  Yes Corwin Levins, MD  magnesium oxide (MAG-OX) 400 MG tablet Take 1 tablet (400 mg total) by mouth 2 (two) times daily. 03/07/14  Yes Aundria Rud, NP  milrinone [facility]) 20 MG/100ML SOLN infusion Inject 62.7375 mcg/min into the vein continuous. 04/15/14  Yes Ok Anis, NP  pantoprazole (PROTONIX) 40 MG tablet Take 1 tablet (40 mg total) by mouth daily. 05/18/14  Yes Aundria Rud, NP  potassium chloride SA (K-DUR,KLOR-CON) 20 MEQ tablet Take 2 tablets (40 mEq total) by mouth 2 (two) times daily. 04/15/14  Yes Ok Anis, NP  oxyCODONE-acetaminophen (PERCOCET) 5-325 MG per tablet Take 2 tablets by mouth every 4 (four) hours as needed. 05/14/14    Brandt Loosen, MD   BP 115/77  Pulse 98  Temp(Src) 97.6 F (36.4 C) (Oral)  Resp 19  Ht 5\' 8"  (1.727 m)  Wt 348 lb (157.852 kg)  BMI 52.93 kg/m2  SpO2 92% Physical Exam  Nursing note and vitals reviewed. Constitutional: He is oriented to person, place, and time. He appears well-developed and well-nourished.  HENT:  Head: Normocephalic.  Cardiovascular: Normal rate and regular rhythm.   Pulmonary/Chest: Breath sounds normal. Tachypnea noted. He has no wheezes.  Abdominal: Soft. There is tenderness in the right upper quadrant and epigastric area. There is no rebound, no guarding and no tenderness at McBurney's point.  Morbidly ill morbidly obese. Exam is somewhat limited by body habitus.  Musculoskeletal: He exhibits edema.  Pitting edema of both lower extremities.  Neurological: He is alert and oriented to person, place, and time.  Skin:  Skin is warm.  Psychiatric: He has a normal mood and affect. His behavior is normal.    ED Course  Procedures   COORDINATION OF CARE:  Nursing notes reviewed. Vital signs reviewed. Initial pt interview and examination performed.   Filed Vitals:   05/23/14 0407 05/23/14 0430  BP: 98/82 115/77  Pulse: 104 98  Temp: 97.6 F (36.4 C)   TempSrc: Oral   Resp: 26 19  Height: 5\' 8"  (1.727 m)   Weight: 348 lb (157.852 kg)   SpO2: 92% 92%    5:30 a.m. patient seen and evaluated. He appears uncomfortable with some tachypnea. Reports having pain especially with trying to lay flat in the upper abdomen.   6:00AM pt discussed in sign out with Ebbie Ridge PA-C. He will follow labs and [place].   Treatment plan initiated: Medications  HYDROmorphone (DILAUDID) injection 1 mg (not administered)  ondansetron (ZOFRAN) injection 4 mg (not administered)      Imaging Review Dg Chest Port 1 View  05/23/2014   CLINICAL DATA:  Epigastric/chest pain.  EXAM: PORTABLE CHEST - 1 VIEW  COMPARISON:  Chest radiograph May 15, 2014.  FINDINGS: The cardiac  silhouette remains mildly enlarged, even with consideration to this low inspiratory portable examination. Increasing retrocardiac consolidation with central pulmonary vasculature congestion and mild interstitial prominence. Suspected small left pleural effusion though, left lung base incompletely imaged.  Right PICC distal tip projects in proximal superior vena cava. No pneumothorax. Multiple EKG lines overlie the patient and may obscure subtle underlying pathology. Soft tissue planes and included osseous structures are nonsuspicious.  IMPRESSION: Stable cardiomegaly with interstitial prominence suggesting pulmonary edema, increasing retrocardiac consolidation could reflect atelectasis, confluent edema or even pneumonia with suspected small left pleural effusion.  No apparent change in right PICC.   Electronically Signed   By: Awilda Metro   On: 05/23/2014 05:02     EKG Interpretation   Date/Time:  Tuesday May 23 2014 04:46:43 EDT Ventricular Rate:  97 PR Interval:  201 QRS Duration: 144 QT Interval:  397 QTC Calculation: 504 R Axis:   -63 Text Interpretation:  Sinus rhythm Ventricular trigeminy Borderline  prolonged PR interval Left bundle branch block increased pvcs from prior  Confirmed by OTTER  MD, OLGA ([id number]) on 05/23/2014 5:30:57 AM      MDM   Final diagnoses:  None        Angus Seller, PA-C 05/23/14 [phone]

## 2014-05-23 NOTE — ED Notes (Signed)
Per EMS, patient experiencing epigastric pain, off and on. Prescription not helping.  Always starts after he eats, and lays down, improves when he sits up.  Hx of CHF, asthma, sleep apnea, pre-diabetic. PICC line present on arrival with Milrinone  VS: BP 150/72, P 60.  Nonrebreather on arrival.

## 2014-05-25 ENCOUNTER — Encounter (HOSPITAL_COMMUNITY): Payer: Medicare Other

## 2014-05-26 ENCOUNTER — Ambulatory Visit (HOSPITAL_COMMUNITY)
Admission: RE | Admit: 2014-05-26 | Discharge: 2014-05-26 | Disposition: A | Discharge status: Home / Self Care | Payer: Medicare Other | Source: Ambulatory Visit | Attending: Internal Medicine | Admitting: Internal Medicine

## 2014-05-26 ENCOUNTER — Encounter: Payer: Self-pay | Admitting: Internal Medicine

## 2014-05-26 VITALS — BP 98/64 | HR 103 | Wt 358.5 lb

## 2014-05-26 DIAGNOSIS — D509 Iron deficiency anemia, unspecified: Secondary | ICD-10-CM

## 2014-05-26 DIAGNOSIS — D649 Anemia, unspecified: Secondary | ICD-10-CM | POA: Insufficient documentation

## 2014-05-26 DIAGNOSIS — E119 Type 2 diabetes mellitus without complications: Secondary | ICD-10-CM | POA: Insufficient documentation

## 2014-05-26 DIAGNOSIS — I5022 Chronic systolic (congestive) heart failure: Secondary | ICD-10-CM | POA: Insufficient documentation

## 2014-05-26 DIAGNOSIS — I129 Hypertensive chronic kidney disease with stage 1 through stage 4 chronic kidney disease, or unspecified chronic kidney disease: Secondary | ICD-10-CM | POA: Diagnosis not present

## 2014-05-26 DIAGNOSIS — N183 Chronic kidney disease, stage 3 unspecified: Secondary | ICD-10-CM | POA: Diagnosis not present

## 2014-05-26 DIAGNOSIS — N189 Chronic kidney disease, unspecified: Secondary | ICD-10-CM | POA: Insufficient documentation

## 2014-05-26 DIAGNOSIS — E785 Hyperlipidemia, unspecified: Secondary | ICD-10-CM | POA: Diagnosis not present

## 2014-05-26 DIAGNOSIS — G4730 Sleep apnea, unspecified: Secondary | ICD-10-CM

## 2014-05-26 DIAGNOSIS — I428 Other cardiomyopathies: Secondary | ICD-10-CM | POA: Diagnosis not present

## 2014-05-26 DIAGNOSIS — I509 Heart failure, unspecified: Secondary | ICD-10-CM | POA: Diagnosis present

## 2014-05-26 MED ORDER — AMIODARONE HCL 400 MG PO TABS: 200.0000 mg | ORAL_TABLET | Freq: Two times a day (BID) | ORAL | Status: AC

## 2014-05-26 MED ORDER — POTASSIUM CHLORIDE CRYS ER 20 MEQ PO TBCR: 60.0000 meq | EXTENDED_RELEASE_TABLET | Freq: Two times a day (BID) | ORAL | Status: DC

## 2014-05-26 MED ORDER — BUMETANIDE 2 MG PO TABS: 5.0000 mg | ORAL_TABLET | Freq: Two times a day (BID) | ORAL | Status: AC

## 2014-05-26 NOTE — Patient Instructions (Addendum)
Decrease Amiodarone to 200 mg Twice daily   Increase Bumex to 5 mg (2 & 1/2 tabs) Twice daily   Increase Potassium to 60 meq (3 tabs) Twice daily   Your physician has requested that you have an echocardiogram. Echocardiography is a painless test that uses sound waves to create images of your heart. It provides your doctor with information about the size and shape of your heart and how well your heart's chambers and valves are working. This procedure takes approximately one hour. There are no restrictions for this procedure.  Your physician recommends that you schedule a follow-up appointment in: 3 weeks  You have been referred to GI

## 2014-05-27 NOTE — Progress Notes (Signed)
Patient ID: Steven Carota., male   DOB: 07/02/1976, 38 y.o.   MRN: [id number]  PCP: Dr. Jonny Ruiz  HPI: Steven Wyatt is a 38 yo male with a history of NICM (nor cors Oct 2014), chronic systolic HF - EF 57%, LBBB, morbid obesity, OSA, HTN, CKD and HLD.   Admitted 1/13-1/18/15 for A/C HF. He was placed on IV lasix gtt and milrinone, however signed out AMA. During his admission his weight decreased from 405 to 368 lbs. ECHO showed EF 20% with diff HK, grade II DD, mod pulm HTN and mod sev LAE. His Cr bumped to 1.56. CO-OX when he left AMA was 35% on 01/08/14.   Readmitted 01/11/14 after he had syncopal episode.  Diuresed again with lasix drip and metolazone. He was transitioned to bumex 4 mg twice a day. He was not be placed on Ace/Spiro due to CKD. He is not currently LVAD/transplant candidate due to obesity, noncompliance, RV dysfunction, and CKD. Discharged on home Milrinone at 0.375 mcg with [employer] to follow. Discharge weight 357 pounds.   Admitted 4/9-4/11/15 for perirectal abscess which he had I/D and was discharged. His Cr increased and was readmitted 4/20 for CP and volume overload after reduction in milrinone to 0.25. It was increased back to 0.375, however had 30 beat run VT and LifeVest was arranged (not compliant with Lifevest). Weight on discharge was 348 lbs (04/15/14). Presented back to hospital 5/2 and was admitted again for CP. He had been out of his medications for 1 week. Diuresed and discharged 04/24/14 at weight of 343 lbs. Presented to ED 04/25/14 and gave him 40 meq of potassium and fentanyl.  He was again admitted lateral in 5/15 with chest pain.  Troponin was normal.  His hemoglobin was low and he got 2 units of PRBCs.  He has been started on amiodarone for frequent PVCs. He has been told that he is not a VAD candidate here due to noncompliance and ongoing difficulty getting along with staff members.  Echo 4/15 EF 35% (?)  - very poor windows   Follow up: Since last hospital discharge, Steven Wyatt was  seen in the ER yesterday for abdominal pain, nausea, and vomiting.  CT abd/pelvis showed mild inflammatory changes in the pelvis (fat stranding) of uncertain etiology.  RUQ [place] was negative.  Patient has had chronic abdominal pain with anemia.    Patient continues to be short of breath with exertion.  He can walk about 50 feet before becoming dyspneic.  Weight is up about 3 lbs from hospital discharge.    Labs 01/15/14 K 3.3 Creatinine 1.75  Labs 01/16/14 K 3.5 Creatinine 1.65  Labs 3/15 K 2.9 => 3.7, creatinine 1.34 => 1.36, HCT 33.5 Labs 04/2014: K+ 3.5 and creatinine 1.46, Troponin 0.30, pro-BNP 1483, Hgb 8.6 Labs 05/01/14 Cr 1.43 Labs 6/15 K 3.3, creatinine 1.48, HCT 29  ECG: NSR, LBBB with QRS 144 msec   FH: No CHF, no SCD that he knows of.  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  Moved from [place].   ROS: All systems negative except as listed in HPI, PMH and Problem List.  PMH: 1. Nonischemic cardiomyopathy: Initial workup in [place].  LHC (10/14) with normal coronaries.  Echo (1/15) with EF 20%, mild diffuse hypokinesis, moderately dilated LV, moderately dilated RV with moderately decreased systolic function, PA systolic pressure 59 mmHg.  1/15 low output heart failure, milrinone begun.  Echo (3/15) with  EF 35% (but very technically difficult).  2. Morbid obesity 3. OHS/OSA on Bipap.  4. HTN 5. Hyperlipidemia 6. CKD: Suspect cardiorenal syndrome 7. Syncope: Vasovagal in setting of vomiting 8. Type II diabetes: Borderline.  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 10. Anemia   Current Outpatient Prescriptions  Medication Sig Dispense Refill  . albuterol (PROVENTIL HFA;VENTOLIN HFA) 108 (90 BASE) MCG/ACT inhaler Inhale 2 puffs into the lungs daily as needed for wheezing or shortness of breath.      Marland Kitchen. amiodarone (PACERONE) 400 MG tablet Take 0.5 tablets (200 mg total) by mouth 2 (two) times daily.  60 tablet  2  . aspirin EC 81  MG tablet Take 81 mg by mouth daily.      Marland Kitchen. atorvastatin (LIPITOR) 80 MG tablet Take 80 mg by mouth daily.      . bumetanide (BUMEX) 2 MG tablet Take 2.5 tablets (5 mg total) by mouth 2 (two) times daily.  120 tablet  6  . carvedilol (COREG) 3.125 MG tablet Take 1 tablet (3.125 mg total) by mouth 2 (two) times daily.  60 tablet  3  . digoxin (LANOXIN) 0.125 MG tablet Take 1 tablet (0.125 mg total) by mouth daily.  30 tablet  6  . hydrALAZINE (APRESOLINE) 25 MG tablet Take 1 tablet (25 mg total) by mouth every 8 (eight) hours.  90 tablet  6  . insulin aspart (NOVOLOG FLEXPEN) 100 UNIT/ML FlexPen Inject 1-15 Units into the skin 3 (three) times daily with meals. Sliding scale  15 mL  11  . insulin glargine (LANTUS) 100 UNIT/ML injection Inject 0.25 mLs (25 Units total) into the skin at bedtime.  10 mL  11  . isosorbide mononitrate (IMDUR) 60 MG 24 hr tablet Take 1 tablet (60 mg total) by mouth daily.  30 tablet  3  . loratadine (CLARITIN) 10 MG tablet Take 1 tablet (10 mg total) by mouth daily.  90 tablet  3  . magnesium oxide (MAG-OX) 400 MG tablet Take 1 tablet (400 mg total) by mouth 2 (two) times daily.  60 tablet  3  . milrinone (PRIMACOR) 20 MG/100ML SOLN infusion Inject 62.7375 mcg/min into the vein continuous.  100 mL  6  . oxyCODONE-acetaminophen (PERCOCET/ROXICET) 5-325 MG per tablet Take 1 tablet by mouth every 6 (six) hours as needed for severe pain.  15 tablet  0  . pantoprazole (PROTONIX) 40 MG tablet Take 1 tablet (40 mg total) by mouth daily.  30 tablet  3  . potassium chloride SA (K-DUR,KLOR-CON) 20 MEQ tablet Take 3 tablets (60 mEq total) by mouth 2 (two) times daily.  120 tablet  3  . promethazine (PHENERGAN) 25 MG tablet Take 1 tablet (25 mg total) by mouth every 6 (six) hours as needed for nausea or vomiting.  10 tablet  0   No current facility-administered medications for this encounter.    Filed Vitals:   05/26/14 1221  BP: 98/64  Pulse: 103  Weight: 358 lb 8 oz (162.615  kg)  SpO2: 97%    PHYSICAL EXAM: General:  Stable appearing. No resp difficulty; wife present HEENT: normal Neck: Thick. JVP difficult to assess d/t body habitus but appears mildly elevated; Carotids 2+ bilaterally; no bruits. No lymphadenopathy or thryomegaly appreciated. Cor: PMI nonpalpable. Regular rate & rhythm.  No S3 noted.  Lungs: clear Abdomen: obese soft, nontender, nondistended.Good bowel sounds. Extremities: no cyanosis, clubbing, rash. LUE double lumen PICC. Chronic lower extremity edema.  Neuro: alert & orientedx3, cranial nerves  grossly intact. Moves all 4 extremities w/o difficulty. Affect pleasant.  ASSESSMENT & PLAN:  1. Chronic Systolic Heart Failure: Nonischemic cardiomyopathy with LBBB. EF reported to be 35% by echo on 03/2014 but likely worse (very poor images).  Currently NYHA III symptoms on milrinone. Failed milrinone wean with co-ox in the 30%. Dr. Gala Romney has told him that currently he is not a candidate for VAD here due to noncompliance and difficulty working with our staff. He says that he is going to make an effort to cooperate with recommendations from our office.  He is volume overloaded today but not markedly so. - Continue current milrinone. - Increase Bumex to 5 mg bid with BMET/digoxin today and BMET next week.  - Continue current Coreg, digoxin, hydralazine/Imdur.  - I do think that it would be reasonable to place CRT-D device (LBBB with QRS 144 msec today).  I will have him go back to Dr. Graciela Husbands.  - If compliance improves, could envision LVAD.  He will need weight loss prior to considering cardiac transplant.  - Will get echo with contrast to get a better idea of EF.  2. OSA/OHS:  continue nightly BiPap, will try to arrange adding oxygen.  3. CKD: Stable creatinine, follow closely.  Suspect cardiorenal syndrome primarily.  4. DM2: Followed by PCP  5. PVCs: Frequent PVCs, ?contribution to cardiomyopathy.  Therefore, now on amiodarone.   - Decrease  amiodarone to 200 mg bid.  - Check LFTs and TSH.  6. Anemia: Uncertain etiology but required 2 units PRBCs last admission.  He also has had abdominal pain of uncertain etiology with some inflammatory changes in the pelvis. I will have him evaluated by GI.     Steven Ford Shloima Clinch,MD 9:46 PM

## 2014-05-29 ENCOUNTER — Telehealth (HOSPITAL_COMMUNITY): Payer: Self-pay | Admitting: *Deleted

## 2014-05-29 MED ORDER — DIGOXIN 125 MCG PO TABS: 0.0625 mg | ORAL_TABLET | Freq: Every day | ORAL | Status: DC

## 2014-05-29 NOTE — Telephone Encounter (Signed)
Received labs from [facility], digoxin level 1.47 per Dr Gala Romney hold digoxin for 3 days then resume at 1/2 tab daily, pt aware and verbalizes understanding

## 2014-06-01 ENCOUNTER — Telehealth: Payer: Self-pay | Admitting: Internal Medicine

## 2014-06-01 NOTE — Telephone Encounter (Signed)
I received ONO overnight oximetry results with low o2 sat at night of 85%  Robin to contact pt; I dont recall ordering the test, but I am physician of record  Does he have home o2 for night?  Is he using the Bipap?  Is he seeing pulmonary for this?

## 2014-06-01 NOTE — Telephone Encounter (Signed)
Called left message to call back 

## 2014-06-02 IMAGING — CT CT ABD-PELV W/O CM
2 of 4 series · 16 of 46 positions shown, 18 images · non-contrast
Comparison: None.

CLINICAL DATA: Abdominal pain.

EXAM:
CT ABDOMEN AND PELVIS WITHOUT CONTRAST
TECHNIQUE: Multidetector CT imaging of the abdomen and pelvis was performed
following the standard protocol without IV contrast.

[Series 2: abd/ pelvis 5.0 i30f 1 · axial · 0.79mm/px · z∈[-391,+59]mm · 13 of 100 slices shown, 15 images]
[im 5/100  soft-tissue]
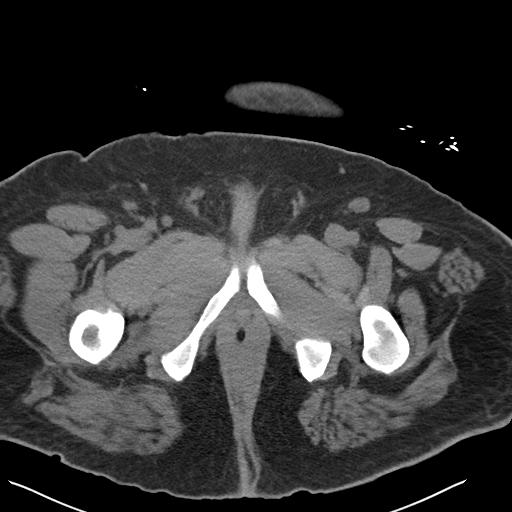
[im 5/100  bone]
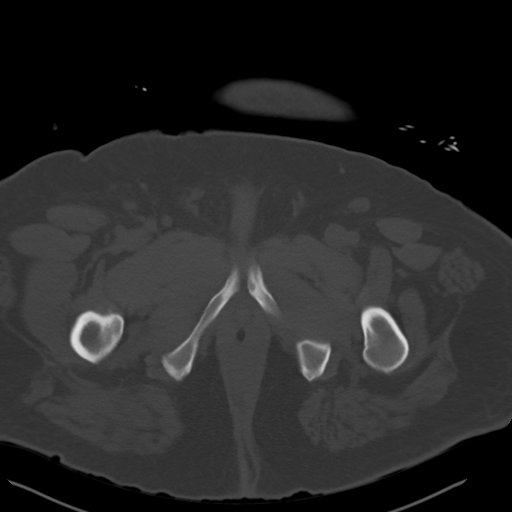
[im 13/100  soft-tissue]
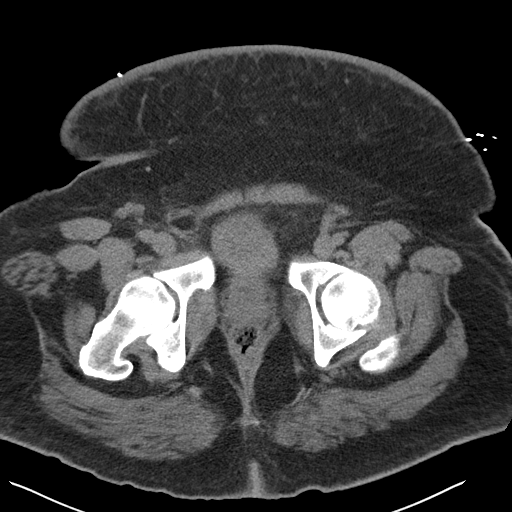
[im 22/100  soft-tissue]
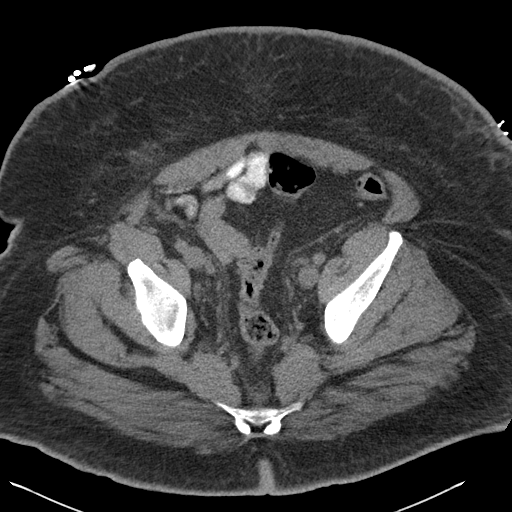
[im 26/100  soft-tissue]
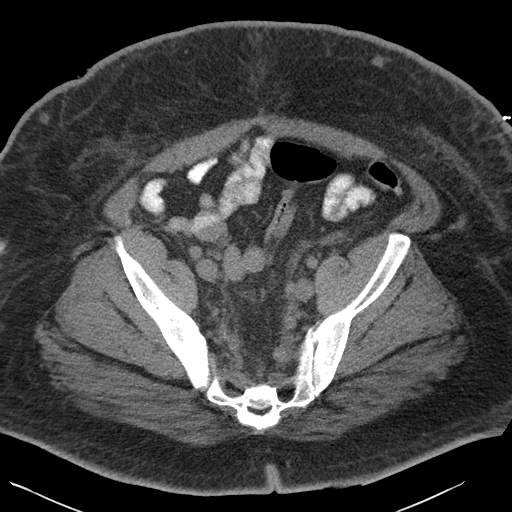
[im 35/100  soft-tissue]
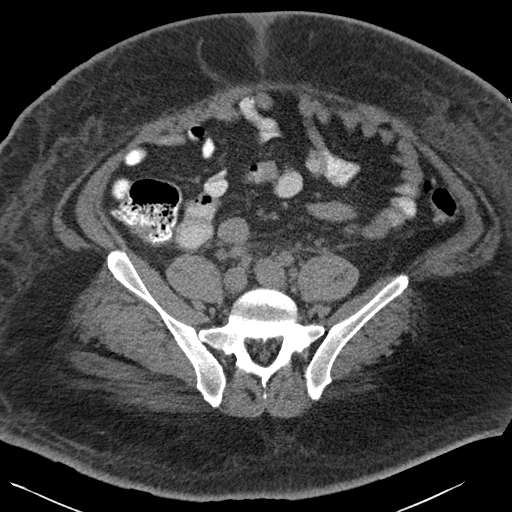
[im 44/100  soft-tissue]
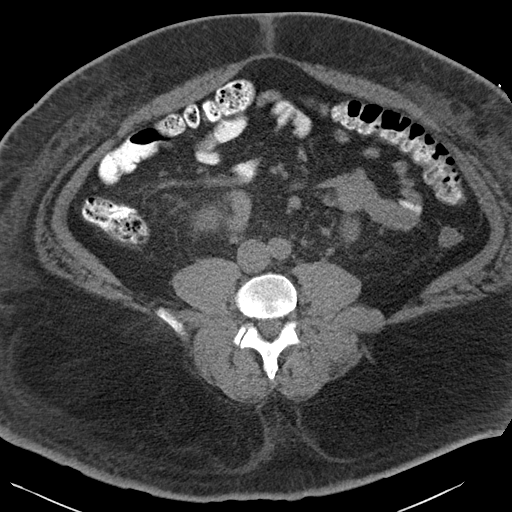
[im 52/100  soft-tissue]
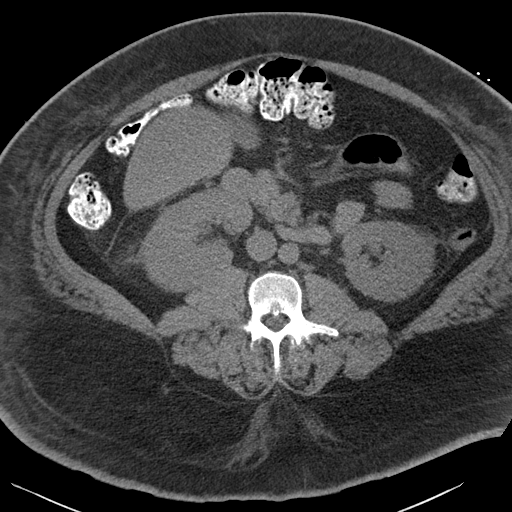
[im 56/100  soft-tissue]
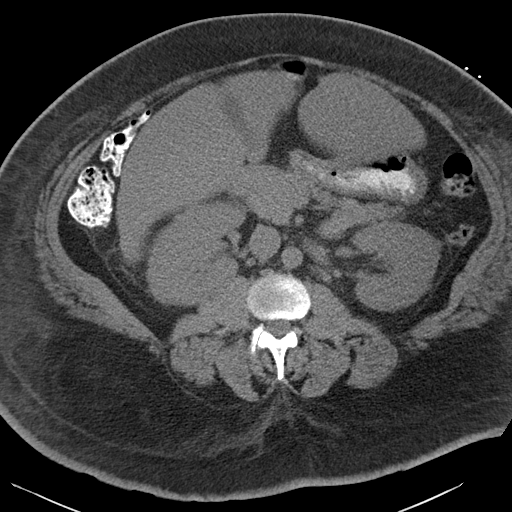
[im 65/100  soft-tissue]
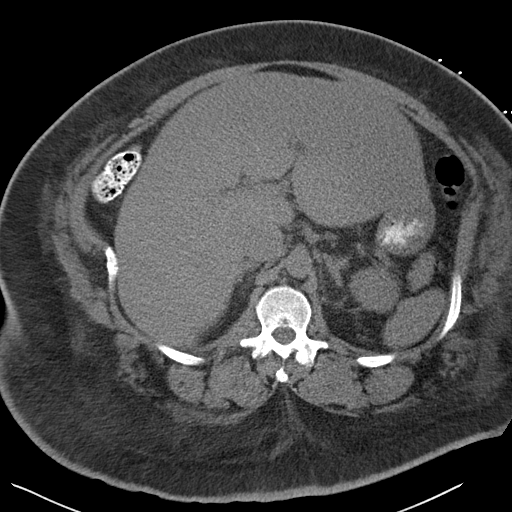
[im 65/100  bone]
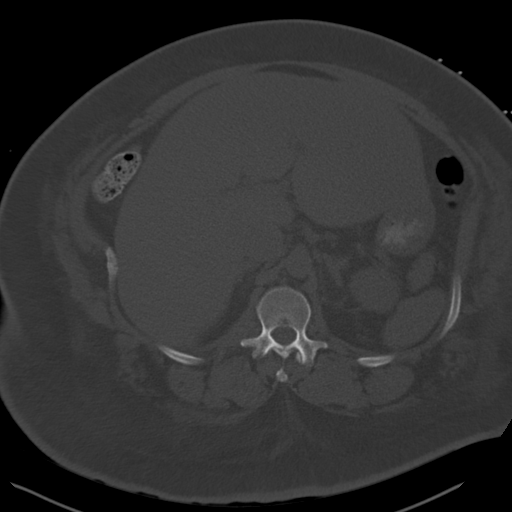
[im 74/100  soft-tissue]
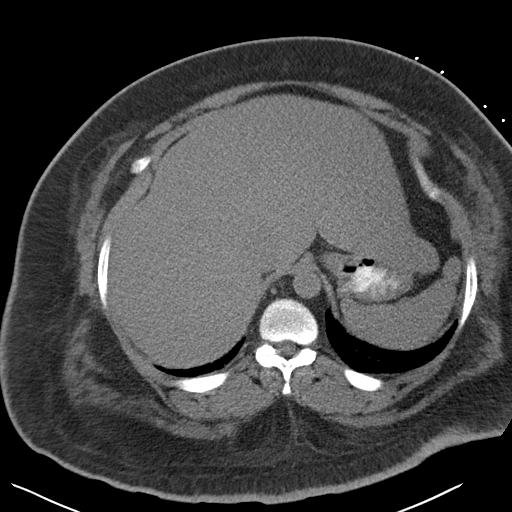
[im 78/100  soft-tissue]
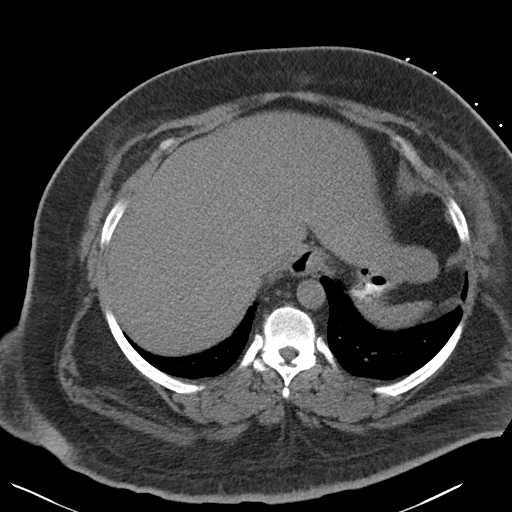
[im 87/100  soft-tissue]
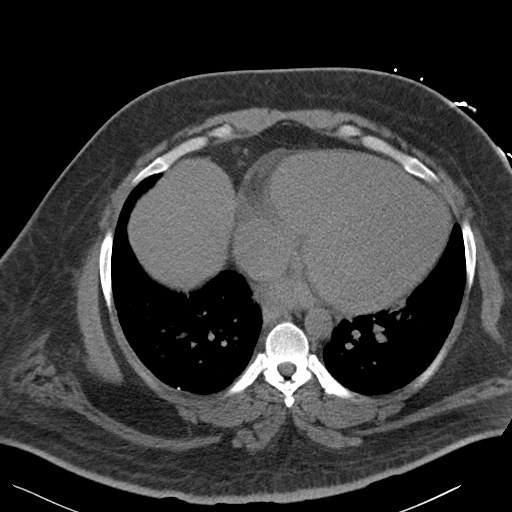
[im 95/100  soft-tissue]
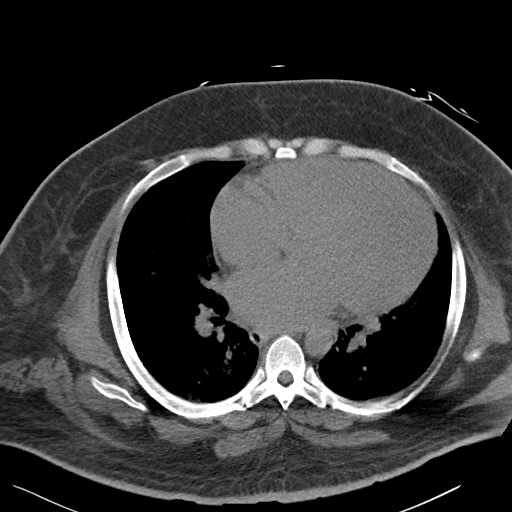

[Series 5: coronals · coronal · 0.84mm/px · 3 of 181 slices shown]
[im 61/181  soft-tissue]
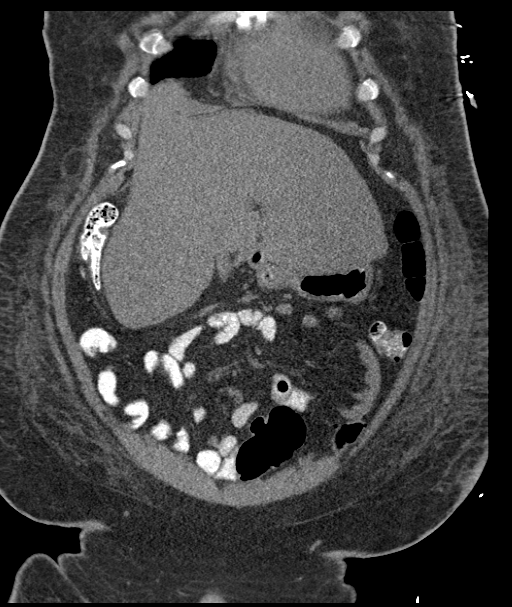
[im 81/181  soft-tissue]
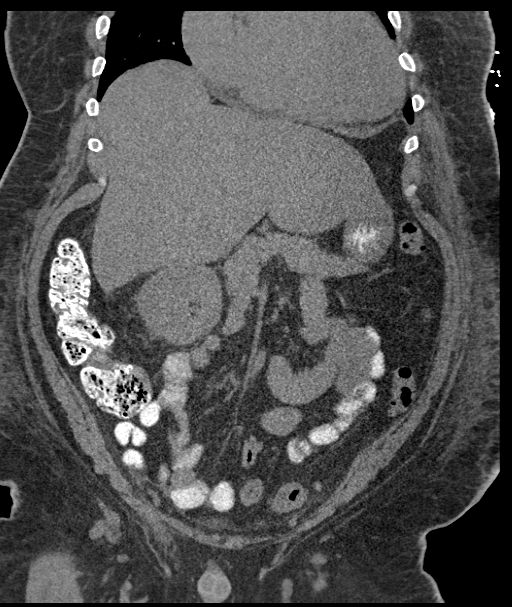
[im 101/181  soft-tissue]
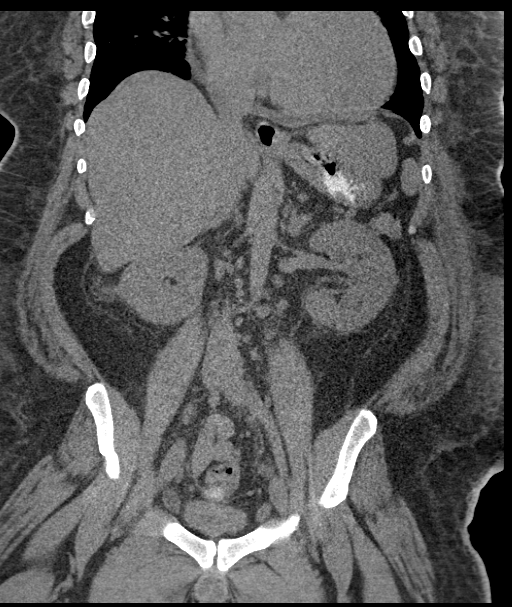

[16 of 46 positions shown; findings below may reference images not displayed]

FINDINGS: Evaluation lung bases is degraded by motion artifact. There is
diffuse ground-glass density throughout the lung bases well as
thickening of the interstitial markings. There are areas
demonstrating tree-in-bud configuration with thin the right lung
base. Calcified granuloma posterior right lung base. The heart is
enlarged.

The liver, spleen, adrenals, pancreas are unremarkable within the
limitations of a noncontrast CT. Kidneys unremarkable.

There is no abdominal aortic aneurysm.

Within the mid to lower pelvis in the right pelvic wall region a
trace amount of fluid and stranding is appreciated within the fat.
Enlarged lymph nodes are identified largest in the external iliac
chain image 83 series 2 measuring 1.6 cm in short axis. No loculated
fluid collections are appreciated nor evidence of masses. Prominent
inguinal lymph nodes are identified. The largest is in the left
inguinal region measuring 1.5 cm in short axis.

No abdominal masses, loculated fluid collections nor significant
free fluid or adenopathy is appreciated. The bowel is otherwise
negative. Minimal diverticulosis within the sigmoid colon.

There is no abdominal wall hernia. A very small fat containing right
inguinal hernia is appreciated.

No aggressive appearing osseous lesions.
IMPRESSION: A nonspecific enlarged lymph nodes within the external iliac chain
on the right and prominent mildly enlarged lymph nodes in the
inguinal regions left greater than right. There are mild
inflammatory changes within the pelvis. Differential considerations
include an indolent or resolving infectious or inflammatory process.
Clinical correlation is recommended. More ominous etiologies cannot
be excluded.

Findings within the lung bases which may represent an interstitial
pneumonitis. A component of pulmonary edema is also diagnostic
consideration.

Cardiomegaly

## 2014-06-02 NOTE — Telephone Encounter (Signed)
Called left message to call back 

## 2014-06-02 NOTE — Telephone Encounter (Signed)
Called the patient left a detailed message to call back regarding recent test.

## 2014-06-03 ENCOUNTER — Inpatient Hospital Stay (HOSPITAL_COMMUNITY)
Admission: EM | Admit: 2014-06-03 | Discharge: 2014-06-13 | DRG: 292 | Disposition: A | Discharge status: Home Health | Payer: Medicare Other | Attending: Internal Medicine | Admitting: Internal Medicine

## 2014-06-03 ENCOUNTER — Encounter (HOSPITAL_COMMUNITY): Payer: Self-pay | Admitting: Emergency Medicine

## 2014-06-03 DIAGNOSIS — N039 Chronic nephritic syndrome with unspecified morphologic changes: Secondary | ICD-10-CM

## 2014-06-03 DIAGNOSIS — E098 Drug or chemical induced diabetes mellitus with unspecified complications: Secondary | ICD-10-CM

## 2014-06-03 DIAGNOSIS — Z6841 Body Mass Index (BMI) 40.0 and over, adult: Secondary | ICD-10-CM

## 2014-06-03 DIAGNOSIS — E119 Type 2 diabetes mellitus without complications: Secondary | ICD-10-CM | POA: Diagnosis present

## 2014-06-03 DIAGNOSIS — Z01810 Encounter for preprocedural cardiovascular examination: Secondary | ICD-10-CM

## 2014-06-03 DIAGNOSIS — Z9119 Patient's noncompliance with other medical treatment and regimen: Secondary | ICD-10-CM

## 2014-06-03 DIAGNOSIS — Z91199 Patient's noncompliance with other medical treatment and regimen due to unspecified reason: Secondary | ICD-10-CM

## 2014-06-03 DIAGNOSIS — R0789 Other chest pain: Secondary | ICD-10-CM | POA: Diagnosis present

## 2014-06-03 DIAGNOSIS — J45909 Unspecified asthma, uncomplicated: Secondary | ICD-10-CM | POA: Diagnosis present

## 2014-06-03 DIAGNOSIS — I129 Hypertensive chronic kidney disease with stage 1 through stage 4 chronic kidney disease, or unspecified chronic kidney disease: Secondary | ICD-10-CM | POA: Diagnosis present

## 2014-06-03 DIAGNOSIS — N183 Chronic kidney disease, stage 3 unspecified: Secondary | ICD-10-CM | POA: Diagnosis present

## 2014-06-03 DIAGNOSIS — I5023 Acute on chronic systolic (congestive) heart failure: Secondary | ICD-10-CM

## 2014-06-03 DIAGNOSIS — N189 Chronic kidney disease, unspecified: Secondary | ICD-10-CM

## 2014-06-03 DIAGNOSIS — I10 Essential (primary) hypertension: Secondary | ICD-10-CM

## 2014-06-03 DIAGNOSIS — F419 Anxiety disorder, unspecified: Secondary | ICD-10-CM

## 2014-06-03 DIAGNOSIS — R072 Precordial pain: Secondary | ICD-10-CM

## 2014-06-03 DIAGNOSIS — N179 Acute kidney failure, unspecified: Secondary | ICD-10-CM

## 2014-06-03 DIAGNOSIS — I5041 Acute combined systolic (congestive) and diastolic (congestive) heart failure: Secondary | ICD-10-CM

## 2014-06-03 DIAGNOSIS — Z0389 Encounter for observation for other suspected diseases and conditions ruled out: Secondary | ICD-10-CM

## 2014-06-03 DIAGNOSIS — I428 Other cardiomyopathies: Secondary | ICD-10-CM

## 2014-06-03 DIAGNOSIS — R079 Chest pain, unspecified: Secondary | ICD-10-CM

## 2014-06-03 DIAGNOSIS — I5022 Chronic systolic (congestive) heart failure: Secondary | ICD-10-CM

## 2014-06-03 DIAGNOSIS — I509 Heart failure, unspecified: Secondary | ICD-10-CM | POA: Diagnosis present

## 2014-06-03 DIAGNOSIS — R7989 Other specified abnormal findings of blood chemistry: Secondary | ICD-10-CM

## 2014-06-03 DIAGNOSIS — Z794 Long term (current) use of insulin: Secondary | ICD-10-CM

## 2014-06-03 DIAGNOSIS — Z79899 Other long term (current) drug therapy: Secondary | ICD-10-CM

## 2014-06-03 DIAGNOSIS — R635 Abnormal weight gain: Secondary | ICD-10-CM

## 2014-06-03 DIAGNOSIS — I472 Ventricular tachycardia, unspecified: Secondary | ICD-10-CM

## 2014-06-03 DIAGNOSIS — D649 Anemia, unspecified: Secondary | ICD-10-CM

## 2014-06-03 DIAGNOSIS — E876 Hypokalemia: Secondary | ICD-10-CM | POA: Diagnosis present

## 2014-06-03 DIAGNOSIS — Z7982 Long term (current) use of aspirin: Secondary | ICD-10-CM

## 2014-06-03 DIAGNOSIS — D631 Anemia in chronic kidney disease: Secondary | ICD-10-CM | POA: Diagnosis present

## 2014-06-03 DIAGNOSIS — IMO0001 Reserved for inherently not codable concepts without codable children: Secondary | ICD-10-CM | POA: Diagnosis present

## 2014-06-03 DIAGNOSIS — I5043 Acute on chronic combined systolic (congestive) and diastolic (congestive) heart failure: Principal | ICD-10-CM | POA: Diagnosis present

## 2014-06-03 DIAGNOSIS — E871 Hypo-osmolality and hyponatremia: Secondary | ICD-10-CM

## 2014-06-03 DIAGNOSIS — K611 Rectal abscess: Secondary | ICD-10-CM

## 2014-06-03 DIAGNOSIS — E785 Hyperlipidemia, unspecified: Secondary | ICD-10-CM | POA: Diagnosis present

## 2014-06-03 DIAGNOSIS — N289 Disorder of kidney and ureter, unspecified: Secondary | ICD-10-CM

## 2014-06-03 DIAGNOSIS — J309 Allergic rhinitis, unspecified: Secondary | ICD-10-CM

## 2014-06-03 DIAGNOSIS — G4730 Sleep apnea, unspecified: Secondary | ICD-10-CM

## 2014-06-03 DIAGNOSIS — I5033 Acute on chronic diastolic (congestive) heart failure: Secondary | ICD-10-CM

## 2014-06-03 DIAGNOSIS — G4733 Obstructive sleep apnea (adult) (pediatric): Secondary | ICD-10-CM | POA: Diagnosis present

## 2014-06-03 DIAGNOSIS — E1165 Type 2 diabetes mellitus with hyperglycemia: Secondary | ICD-10-CM

## 2014-06-03 DIAGNOSIS — D509 Iron deficiency anemia, unspecified: Secondary | ICD-10-CM

## 2014-06-03 HISTORY — DX: Other specified postprocedural states: Z98.890

## 2014-06-03 HISTORY — DX: Essential (primary) hypertension: I10

## 2014-06-03 HISTORY — DX: Allergic rhinitis, unspecified: J30.9

## 2014-06-03 HISTORY — DX: Prediabetes: R73.03

## 2014-06-03 NOTE — ED Notes (Signed)
Patient presents to ED via [facility]. Patient c/o of mid sternal chest "pounding" that started approx 1 hour ago.  Denies any radiation. Also c/o of being "short of breath and dizzy." Patient took 324 ASA at home. Refused nitro per EMS.  VSS. EKG unremarkable per EMS. A&Ox4 upon arrival to ED. No acute distress noted. Placed on cardiac monitor.

## 2014-06-04 ENCOUNTER — Encounter (HOSPITAL_COMMUNITY): Payer: Self-pay | Admitting: Cardiology

## 2014-06-04 ENCOUNTER — Inpatient Hospital Stay (HOSPITAL_COMMUNITY): Payer: Medicare Other

## 2014-06-04 ENCOUNTER — Emergency Department (HOSPITAL_COMMUNITY): Payer: Medicare Other

## 2014-06-04 DIAGNOSIS — E876 Hypokalemia: Secondary | ICD-10-CM

## 2014-06-04 DIAGNOSIS — I428 Other cardiomyopathies: Secondary | ICD-10-CM

## 2014-06-04 DIAGNOSIS — I5023 Acute on chronic systolic (congestive) heart failure: Secondary | ICD-10-CM

## 2014-06-04 DIAGNOSIS — I5033 Acute on chronic diastolic (congestive) heart failure: Secondary | ICD-10-CM | POA: Insufficient documentation

## 2014-06-04 DIAGNOSIS — N183 Chronic kidney disease, stage 3 unspecified: Secondary | ICD-10-CM

## 2014-06-04 DIAGNOSIS — I509 Heart failure, unspecified: Secondary | ICD-10-CM

## 2014-06-04 DIAGNOSIS — R7989 Other specified abnormal findings of blood chemistry: Secondary | ICD-10-CM | POA: Diagnosis present

## 2014-06-04 DIAGNOSIS — E785 Hyperlipidemia, unspecified: Secondary | ICD-10-CM

## 2014-06-04 DIAGNOSIS — G4730 Sleep apnea, unspecified: Secondary | ICD-10-CM

## 2014-06-04 DIAGNOSIS — E119 Type 2 diabetes mellitus without complications: Secondary | ICD-10-CM

## 2014-06-04 LAB — FOLATE: FOLATE: 9.1 ng/mL

## 2014-06-04 LAB — TROPONIN I
Troponin I: <0.3 ng/mL (ref <0.30)
Troponin I: <0.3 ng/mL (ref <0.30)

## 2014-06-04 LAB — URINALYSIS, ROUTINE W REFLEX MICROSCOPIC
Glucose, UA: NEGATIVE mg/dL
Hgb urine dipstick: NEGATIVE
KETONES UR: NEGATIVE mg/dL
LEUKOCYTES UA: NEGATIVE
Nitrite: NEGATIVE
PH: 5.5 (ref 5.0–8.0)
Protein, ur: NEGATIVE mg/dL
Specific Gravity, Urine: 1.013 (ref 1.005–1.030)
Urobilinogen, UA: 1 mg/dL (ref 0.0–1.0)

## 2014-06-04 LAB — CBC WITH DIFFERENTIAL/PLATELET
Basophils Absolute: 0 10*3/uL (ref 0.0–0.1)
Basophils Relative: 1 % (ref 0–1)
Eosinophils Absolute: 0.1 10*3/uL (ref 0.0–0.7)
Eosinophils Relative: 1 % (ref 0–5)
HCT: 27 % — ABNORMAL LOW (ref 39.0–52.0)
HEMOGLOBIN: 8.6 g/dL — AB (ref 13.0–17.0)
LYMPHS ABS: 0.7 10*3/uL (ref 0.7–4.0)
LYMPHS PCT: 16 % (ref 12–46)
MCH: 24 pg — ABNORMAL LOW (ref 26.0–34.0)
MCHC: 31.9 g/dL (ref 30.0–36.0)
MCV: 75.2 fL — ABNORMAL LOW (ref 78.0–100.0)
MONOS PCT: 8 % (ref 3–12)
Monocytes Absolute: 0.3 10*3/uL (ref 0.1–1.0)
NEUTROS ABS: 3.1 10*3/uL (ref 1.7–7.7)
NEUTROS PCT: 74 % (ref 43–77)
PLATELETS: 346 10*3/uL (ref 150–400)
RBC: 3.59 MIL/uL — AB (ref 4.22–5.81)
RDW: 17.3 % — ABNORMAL HIGH (ref 11.5–15.5)
WBC: 4.2 10*3/uL (ref 4.0–10.5)

## 2014-06-04 LAB — GLUCOSE, CAPILLARY
GLUCOSE-CAPILLARY: 138 mg/dL — AB (ref 70–99)
Glucose-Capillary: 122 mg/dL — ABNORMAL HIGH (ref 70–99)
Glucose-Capillary: 187 mg/dL — ABNORMAL HIGH (ref 70–99)
Glucose-Capillary: 166 mg/dL — ABNORMAL HIGH (ref 70–99)

## 2014-06-04 LAB — IRON AND TIBC
Iron: 31 ug/dL — ABNORMAL LOW (ref 42–135)
Saturation Ratios: 7 % — ABNORMAL LOW (ref 20–55)
TIBC: 471 ug/dL — ABNORMAL HIGH (ref 215–435)
UIBC: 440 ug/dL — AB (ref 125–400)

## 2014-06-04 LAB — BASIC METABOLIC PANEL
BUN: 40 mg/dL — AB (ref 6–23)
CHLORIDE: 90 meq/L — AB (ref 96–112)
CO2: 28 meq/L (ref 19–32)
Calcium: 9.2 mg/dL (ref 8.4–10.5)
Creatinine, Ser: 1.89 mg/dL — ABNORMAL HIGH (ref 0.50–1.35)
GFR calc Af Amer: 52 mL/min — ABNORMAL LOW (ref >90)
GFR calc non Af Amer: 44 mL/min — ABNORMAL LOW (ref >90)
GLUCOSE: 118 mg/dL — AB (ref 70–99)
POTASSIUM: 2.8 meq/L — AB (ref 3.7–5.3)
SODIUM: 136 meq/L — AB (ref 137–147)

## 2014-06-04 LAB — RETICULOCYTES
RBC.: 3.56 MIL/uL — AB (ref 4.22–5.81)
RETIC COUNT ABSOLUTE: 74.8 10*3/uL (ref 19.0–186.0)
RETIC CT PCT: 2.1 % (ref 0.4–3.1)

## 2014-06-04 LAB — MAGNESIUM: Magnesium: 1.8 mg/dL (ref 1.5–2.5)

## 2014-06-04 LAB — VITAMIN B12: Vitamin B-12: 743 pg/mL (ref 211–911)

## 2014-06-04 LAB — MRSA PCR SCREENING: MRSA by PCR: NEGATIVE

## 2014-06-04 LAB — FERRITIN: Ferritin: 46 ng/mL (ref 22–322)

## 2014-06-04 LAB — PRO B NATRIURETIC PEPTIDE: Pro B Natriuretic peptide (BNP): 1808 pg/mL — ABNORMAL HIGH (ref 0–125)

## 2014-06-04 LAB — D-DIMER, QUANTITATIVE (NOT AT ARMC): D-Dimer, Quant: 1.54 ug/mL-FEU — ABNORMAL HIGH (ref 0.00–0.48)

## 2014-06-04 MED ORDER — HYDROMORPHONE HCL PF 1 MG/ML IJ SOLN
1.0000 mg | Freq: Once | INTRAMUSCULAR | Status: AC
  Administered 2014-06-04: 1 mg via INTRAVENOUS
  Filled 2014-06-04: qty 1

## 2014-06-04 MED ORDER — POTASSIUM CHLORIDE 10 MEQ/100ML IV SOLN
10.0000 meq | INTRAVENOUS | Status: AC
  Administered 2014-06-04 (×3): 10 meq via INTRAVENOUS
  Filled 2014-06-04 (×3): qty 100

## 2014-06-04 MED ORDER — BUMETANIDE 0.25 MG/ML IJ SOLN
1.0000 mg | Freq: Once | INTRAMUSCULAR | Status: AC
  Administered 2014-06-04: 1 mg via INTRAVENOUS
  Filled 2014-06-04: qty 4

## 2014-06-04 MED ORDER — LORATADINE 10 MG PO TABS
10.0000 mg | ORAL_TABLET | Freq: Every day | ORAL | Status: DC
  Administered 2014-06-04 – 2014-06-13 (×9): 10 mg via ORAL
  Filled 2014-06-04 (×10): qty 1

## 2014-06-04 MED ORDER — SODIUM CHLORIDE 0.9 % IJ SOLN
3.0000 mL | Freq: Two times a day (BID) | INTRAMUSCULAR | Status: DC
  Administered 2014-06-04: 3 mL via INTRAVENOUS

## 2014-06-04 MED ORDER — AMIODARONE HCL 200 MG PO TABS
200.0000 mg | ORAL_TABLET | Freq: Two times a day (BID) | ORAL | Status: DC
  Administered 2014-06-04 – 2014-06-13 (×19): 200 mg via ORAL
  Filled 2014-06-04 (×20): qty 1

## 2014-06-04 MED ORDER — PANTOPRAZOLE SODIUM 40 MG PO TBEC
40.0000 mg | DELAYED_RELEASE_TABLET | Freq: Every day | ORAL | Status: DC
  Administered 2014-06-04 – 2014-06-13 (×10): 40 mg via ORAL
  Filled 2014-06-04 (×10): qty 1

## 2014-06-04 MED ORDER — INSULIN ASPART 100 UNIT/ML ~~LOC~~ SOLN
0.0000 [IU] | SUBCUTANEOUS | Status: DC
  Administered 2014-06-04: 1 [IU] via SUBCUTANEOUS
  Administered 2014-06-04: 2 [IU] via SUBCUTANEOUS
  Administered 2014-06-04: 1 [IU] via SUBCUTANEOUS
  Administered 2014-06-05: 3 [IU] via SUBCUTANEOUS
  Administered 2014-06-05 (×2): 2 [IU] via SUBCUTANEOUS
  Administered 2014-06-05: 1 [IU] via SUBCUTANEOUS
  Administered 2014-06-05: 3 [IU] via SUBCUTANEOUS
  Administered 2014-06-05: 2 [IU] via SUBCUTANEOUS
  Administered 2014-06-06: 1 [IU] via SUBCUTANEOUS
  Administered 2014-06-06 (×3): 2 [IU] via SUBCUTANEOUS
  Administered 2014-06-07: 3 [IU] via SUBCUTANEOUS
  Administered 2014-06-07 – 2014-06-08 (×5): 2 [IU] via SUBCUTANEOUS
  Administered 2014-06-08: 3 [IU] via SUBCUTANEOUS
  Administered 2014-06-09: 1 [IU] via SUBCUTANEOUS
  Administered 2014-06-09: 3 [IU] via SUBCUTANEOUS
  Administered 2014-06-09: 1 [IU] via SUBCUTANEOUS
  Administered 2014-06-10 – 2014-06-12 (×2): 2 [IU] via SUBCUTANEOUS

## 2014-06-04 MED ORDER — ASPIRIN EC 81 MG PO TBEC: 81.0000 mg | DELAYED_RELEASE_TABLET | Freq: Every day | ORAL | Status: DC

## 2014-06-04 MED ORDER — POTASSIUM CHLORIDE CRYS ER 20 MEQ PO TBCR: 60.0000 meq | EXTENDED_RELEASE_TABLET | Freq: Three times a day (TID) | ORAL | Status: DC

## 2014-06-04 MED ORDER — MILRINONE IN DEXTROSE 20 MG/100ML IV SOLN
0.3750 ug/kg/min | INTRAVENOUS | Status: DC
Start: 2014-06-04 — End: 2014-06-13
  Administered 2014-06-04 – 2014-06-13 (×34): 0.375 ug/kg/min via INTRAVENOUS
  Filled 2014-06-04 (×39): qty 100

## 2014-06-04 MED ORDER — DIGOXIN 0.0625 MG HALF TABLET
0.0625 mg | ORAL_TABLET | Freq: Every day | ORAL | Status: DC
  Administered 2014-06-04 – 2014-06-06 (×3): 0.0625 mg via ORAL
  Filled 2014-06-04 (×4): qty 1

## 2014-06-04 MED ORDER — ISOSORBIDE MONONITRATE ER 60 MG PO TB24
60.0000 mg | ORAL_TABLET | Freq: Every day | ORAL | Status: DC
  Administered 2014-06-04 – 2014-06-13 (×10): 60 mg via ORAL
  Filled 2014-06-04 (×10): qty 1

## 2014-06-04 MED ORDER — POTASSIUM CHLORIDE CRYS ER 20 MEQ PO TBCR
60.0000 meq | EXTENDED_RELEASE_TABLET | Freq: Two times a day (BID) | ORAL | Status: AC
  Administered 2014-06-04: 60 meq via ORAL
  Filled 2014-06-04 (×2): qty 3

## 2014-06-04 MED ORDER — HYDRALAZINE HCL 25 MG PO TABS
25.0000 mg | ORAL_TABLET | Freq: Three times a day (TID) | ORAL | Status: DC
  Administered 2014-06-04 – 2014-06-13 (×23): 25 mg via ORAL
  Filled 2014-06-04 (×31): qty 1

## 2014-06-04 MED ORDER — BUMETANIDE 0.25 MG/ML IJ SOLN: 1.0000 mg | Freq: Three times a day (TID) | INTRAMUSCULAR | Status: DC

## 2014-06-04 MED ORDER — MAGNESIUM SULFATE 40 MG/ML IJ SOLN
2.0000 g | Freq: Once | INTRAMUSCULAR | Status: AC
  Administered 2014-06-04: 2 g via INTRAVENOUS
  Filled 2014-06-04: qty 50

## 2014-06-04 MED ORDER — SODIUM CHLORIDE 0.9 % IJ SOLN: 3.0000 mL | INTRAMUSCULAR | Status: DC | PRN

## 2014-06-04 MED ORDER — ENOXAPARIN SODIUM 150 MG/ML ~~LOC~~ SOLN
160.0000 mg | Freq: Once | SUBCUTANEOUS | Status: AC
  Administered 2014-06-04: 160 mg via SUBCUTANEOUS
  Filled 2014-06-04: qty 2

## 2014-06-04 MED ORDER — POTASSIUM CHLORIDE 10 MEQ/100ML IV SOLN: 10.0000 meq | INTRAVENOUS | Status: DC

## 2014-06-04 MED ORDER — INSULIN GLARGINE 100 UNIT/ML ~~LOC~~ SOLN
25.0000 [IU] | Freq: Every day | SUBCUTANEOUS | Status: DC
  Administered 2014-06-04 – 2014-06-05 (×2): 25 [IU] via SUBCUTANEOUS
  Filled 2014-06-04 (×2): qty 0.25

## 2014-06-04 MED ORDER — SODIUM CHLORIDE 0.9 % IJ SOLN
10.0000 mL | Freq: Two times a day (BID) | INTRAMUSCULAR | Status: DC
  Administered 2014-06-04 – 2014-06-05 (×3): 10 mL
  Administered 2014-06-05: 20 mL
  Administered 2014-06-06: 10 mL
  Administered 2014-06-06: 20 mL
  Administered 2014-06-07 – 2014-06-13 (×4): 10 mL

## 2014-06-04 MED ORDER — ENOXAPARIN SODIUM 150 MG/ML ~~LOC~~ SOLN
150.0000 mg | Freq: Two times a day (BID) | SUBCUTANEOUS | Status: DC
  Filled 2014-06-04 (×4): qty 1

## 2014-06-04 MED ORDER — CARVEDILOL 3.125 MG PO TABS
3.1250 mg | ORAL_TABLET | Freq: Two times a day (BID) | ORAL | Status: DC
  Administered 2014-06-04 – 2014-06-13 (×18): 3.125 mg via ORAL
  Filled 2014-06-04 (×23): qty 1

## 2014-06-04 MED ORDER — SODIUM CHLORIDE 0.9 % IV SOLN: 250.0000 mL | INTRAVENOUS | Status: DC | PRN

## 2014-06-04 MED ORDER — ASPIRIN EC 325 MG PO TBEC
325.0000 mg | DELAYED_RELEASE_TABLET | Freq: Every day | ORAL | Status: DC
  Administered 2014-06-04 – 2014-06-13 (×10): 325 mg via ORAL
  Filled 2014-06-04 (×10): qty 1

## 2014-06-04 MED ORDER — OXYCODONE-ACETAMINOPHEN 5-325 MG PO TABS
1.0000 | ORAL_TABLET | Freq: Four times a day (QID) | ORAL | Status: DC | PRN
  Administered 2014-06-04: 1 via ORAL
  Filled 2014-06-04: qty 1

## 2014-06-04 MED ORDER — OXYCODONE-ACETAMINOPHEN 5-325 MG PO TABS
1.0000 | ORAL_TABLET | Freq: Four times a day (QID) | ORAL | Status: DC | PRN
  Administered 2014-06-05 – 2014-06-12 (×12): 2 via ORAL
  Filled 2014-06-04 (×12): qty 2

## 2014-06-04 MED ORDER — SODIUM CHLORIDE 0.9 % IJ SOLN
10.0000 mL | INTRAMUSCULAR | Status: DC | PRN
  Administered 2014-06-04 – 2014-06-06 (×2): 10 mL

## 2014-06-04 MED ORDER — MAGNESIUM OXIDE 400 (241.3 MG) MG PO TABS
400.0000 mg | ORAL_TABLET | Freq: Two times a day (BID) | ORAL | Status: DC
  Administered 2014-06-04 – 2014-06-13 (×19): 400 mg via ORAL
  Filled 2014-06-04 (×21): qty 1

## 2014-06-04 MED ORDER — TECHNETIUM TO 99M ALBUMIN AGGREGATED
6.0000 | Freq: Once | INTRAVENOUS | Status: AC | PRN
  Administered 2014-06-04: 6 via INTRAVENOUS

## 2014-06-04 MED ORDER — ONDANSETRON HCL 4 MG/2ML IJ SOLN
4.0000 mg | Freq: Once | INTRAMUSCULAR | Status: AC
  Administered 2014-06-04: 4 mg via INTRAVENOUS
  Filled 2014-06-04: qty 2

## 2014-06-04 MED ORDER — ATORVASTATIN CALCIUM 80 MG PO TABS
80.0000 mg | ORAL_TABLET | Freq: Every day | ORAL | Status: DC
Start: 2014-06-04 — End: 2014-06-13
  Administered 2014-06-04 – 2014-06-13 (×10): 80 mg via ORAL
  Filled 2014-06-04 (×10): qty 1

## 2014-06-04 MED ORDER — ALTEPLASE 2 MG IJ SOLR
2.0000 mg | Freq: Once | INTRAMUSCULAR | Status: AC
  Administered 2014-06-04: 2 mg
  Filled 2014-06-04: qty 2

## 2014-06-04 MED ORDER — ALBUTEROL SULFATE (2.5 MG/3ML) 0.083% IN NEBU: 3.0000 mL | INHALATION_SOLUTION | Freq: Four times a day (QID) | RESPIRATORY_TRACT | Status: DC | PRN

## 2014-06-04 MED ORDER — POTASSIUM CHLORIDE CRYS ER 20 MEQ PO TBCR
40.0000 meq | EXTENDED_RELEASE_TABLET | Freq: Once | ORAL | Status: AC
  Administered 2014-06-04: 40 meq via ORAL
  Filled 2014-06-04: qty 2

## 2014-06-04 MED ORDER — BUMETANIDE 0.25 MG/ML IJ SOLN
1.0000 mg | Freq: Three times a day (TID) | INTRAMUSCULAR | Status: DC
  Administered 2014-06-04 – 2014-06-05 (×3): 1 mg via INTRAVENOUS
  Filled 2014-06-04 (×6): qty 4

## 2014-06-04 NOTE — ED Notes (Signed)
MD at bedside. 

## 2014-06-04 NOTE — ED Notes (Signed)
Dr. Wentz at bedside. 

## 2014-06-04 NOTE — Consult Note (Addendum)
Primary cardiologist: Dr. Nicholes Mango ([facility]) Consulting cardiologist: Dr. Jonelle Sidle  Clinical Summary Steven Wyatt is a 38 y.o.male with past medical history outlined below, admitted with fatigue, shortness of breath associated with generalized chest discomfort and fullness, fluctuating weights over the last 3 or 4 days. He states that he has been taking his medications as directed, including regular diuretic use. He used an as needed metolazone over the weekend, but did not take his Bumex yesterday because he was concerned about his kidneys, stated that his lower back was hurting.  Weight reported 358 pounds in CHF clinic with Dr. Shirlee Latch back on June 5, recorded at 361 pounds today. When he was last seen on June 5, Bumex was increased to 5 mg twice daily. Patient states that his lowest weight home since that time was 350 pounds.  He also had an overnight pulse oximetry assessment reporting low saturations of 85%. He uses BiPAP but apparently does not have an oxygen bleed in.  Chest x-ray does show some vascular congestion, perhaps minimal pulmonary edema.   Allergies  Allergen Reactions  . Iodine Anaphylaxis  . Shellfish Allergy Anaphylaxis    Medications Scheduled Medications: . amiodarone  200 mg Oral BID  . aspirin EC  325 mg Oral Daily  . atorvastatin  80 mg Oral Daily  . bumetanide  1 mg Intravenous 3 times per day  . carvedilol  3.125 mg Oral BID WC  . digoxin  0.0625 mg Oral Daily  . enoxaparin (LOVENOX) injection  150 mg Subcutaneous Q12H  . hydrALAZINE  25 mg Oral 3 times per day  . insulin aspart  0-9 Units Subcutaneous 6 times per day  . insulin glargine  25 Units Subcutaneous QHS  . isosorbide mononitrate  60 mg Oral Daily  . loratadine  10 mg Oral Daily  . magnesium oxide  400 mg Oral BID  . pantoprazole  40 mg Oral Daily  . potassium chloride SA  60 mEq Oral BID  . sodium chloride  3 mL Intravenous Q12H    Infusions: . milrinone 0.375  mcg/kg/min (06/04/14 0904)    PRN Medications: sodium chloride, albuterol, oxyCODONE-acetaminophen, sodium chloride   Past Medical History  Diagnosis Date  . Chronic systolic CHF (congestive heart failure)     On home milrinone - dose titrate to 0.337mcg/kg/min 03/2014.  Marland Kitchen Essential hypertension, benign   . Morbid obesity with BMI of 50.0-59.9, adult   . Dyslipidemia   . CKD (chronic kidney disease), stage III   . Asthma   . Microcytic anemia   . NICM (nonischemic cardiomyopathy)   . History of left heart catheterization     Normal coronaries October 2014  . History of chicken pox   . Perirectal abscess 03/2014  . Allergic rhinitis 04/29/2014  . Anxiety   . Chronic bronchitis   . Sleep apnea     BIPAP  . Prediabetes   . DKA (diabetic ketoacidoses) 02/25/2014    Past Surgical History  Procedure Laterality Date  . Incision and drainage abscess N/A 03/31/2014    Procedure: INCISION AND DRAINAGE  PERI-RECTAL ABSCESS;  Surgeon: Axel Filler, MD;  Location: [facility] OR;  Service: General;  Laterality: N/A;  . Cardiac catheterization  October 2014    [facility]; reportedly clean, no further details available    Family History  Problem Relation Age of Onset  . Hypertension Mother   . Diabetes Maternal Grandmother   . Liver disease Father  Social History Steven Wyatt reports that he has never smoked. He has never used smokeless tobacco. Steven Wyatt reports that he does not drink alcohol.  Review of Systems No palpitations or syncope. Chronic leg edema. Appetite has been stable. No obvious bleeding episodes. Otherwise as outlined.  Physical Examination Blood pressure 105/91, pulse 91, temperature 97.5 F (36.4 C), temperature source Oral, resp. rate 17, height 5\' 10"  (1.778 m), weight 361 lb 1.8 oz (163.8 kg), SpO2 97.00%.  Intake/Output Summary (Last 24 hours) at 06/04/14 1350 Last data filed at 06/04/14 1100  Gross per 24 hour  Intake  296.4 ml  Output     375 ml  Net  -78.6 ml   Telemetry: Sinus rhythm.  Morbidly obese, appears comfortable. HEENT: Conjunctiva and lids normal, oropharynx clear. Neck: Supple, iincreased girth, difficult to assess JVP, no carotid bruits, no thyromegaly. Lungs: Decreased breath sounds and generally clear to auscultation, nonlabored breathing at rest. Cardiac: Indistinct PMI, RRR, no S3 or significant systolic murmur, no pericardial rub. Abdomen: Obese, nontender, bowel sounds present, no guarding or rebound. Extremities: Chronic appearing woody edema, distal pulses 1-2+. Skin: Warm and dry. Musculoskeletal: No kyphosis. Neuropsychiatric: Alert and oriented x3, affect grossly appropriate.   Lab Results  Basic Metabolic Panel:  Recent Labs Lab 06/04/14 0010 06/04/14 0845  NA 136*  --   K 2.8*  --   CL 90*  --   CO2 28  --   GLUCOSE 118*  --   BUN 40*  --   CREATININE 1.89*  --   CALCIUM 9.2  --   MG  --  1.8    CBC:  Recent Labs Lab 06/04/14 0010  WBC 4.2  NEUTROABS 3.1  HGB 8.6*  HCT 27.0*  MCV 75.2*  PLT 346    Cardiac Enzymes:  Recent Labs Lab 06/04/14 0010 06/04/14 0845  TROPONINI <0.30 <0.30    Pro-BNP: 1808   ECG Sinus rhythm with prolonged PR interval and LBBB.   Imaging PORTABLE CHEST - 1 VIEW  COMPARISON: Chest radiograph performed 05/23/2014  FINDINGS: The lungs are well-aerated. Vascular congestion is noted. Mildly increased interstitial markings could reflect minimal interstitial edema. No pleural effusion or pneumothorax is seen.  The cardiomediastinal silhouette is borderline enlarged. No acute osseous abnormalities are seen. A right PICC is noted ending about the proximal to mid SVC.  IMPRESSION: Vascular congestion and borderline cardiomegaly. Mildly increased interstitial markings could reflect minimal interstitial edema.   Impression  1. Acute on chronic systolic heart failure.  2. Nonischemic cardiomyopathy with LVEF approximately  20% and chronic left bundle branch block. He is on home milrinone and followed in the advanced heart failure clinic.  3. Obstructive sleep apnea, on BiPAP at night. Sounds like he does need to have additional oxygen at nighttime as well due to recently documented hypoxia.  4. Acute on chronic kidney disease, and Imdur from 1.4-1.8.  5. Severe hypokalemia.  6. History of noncompliance.  7. History of prediabetes.   Recommendations  Diuretic has been converted to IV Bumex by primary team, would assess urine output, might need to be on Lasix infusion. Need to aggressively replete potassium, check magnesium to make her that this is also not low. It is difficult to assess what his dry weight should be. Continue milrinone infusion at current dose. Followup BMET. in a.m. Advanced heart failure team will follow from there.  Jonelle SidleSamuel G. Sharone Picchi, M.D., F.A.C.C.

## 2014-06-04 NOTE — Progress Notes (Signed)
Pt going down for VQ scan on 2L [place]. Pt has gone on and off Bi-pap all morning and has tolerated well (was off for 1 hour for breakfast and is using it only when sleeping). Ordered modified per Dr. David Stall that pt may travel without Bi-pap and it's to be used PRN for sleep. Danna Hefty, RN

## 2014-06-04 NOTE — Progress Notes (Signed)
ANTICOAGULATION CONSULT NOTE - Initial Consult  Pharmacy Consult for Lovenox Indication: r/o PE  Allergies  Allergen Reactions  . Iodine Anaphylaxis  . Shellfish Allergy Anaphylaxis    Patient Measurements: Height: 5\' 9"  (175.3 cm) Weight: 358 lb (162.388 kg) IBW/kg (Calculated) : 70.7  Vital Signs: Temp: 98.2 F (36.8 C) (06/13 2341) Temp src: Oral (06/13 2341) BP: 98/63 mmHg (06/14 0545) Pulse Rate: 101 (06/14 0545)  Labs:  Recent Labs  06/04/14 0010  HGB 8.6*  HCT 27.0*  PLT 346  CREATININE 1.89*  TROPONINI <0.30    Estimated Creatinine Clearance: 82.9 ml/min (by C-G formula based on Cr of 1.89).   Medical History: Past Medical History  Diagnosis Date  . Chronic systolic CHF (congestive heart failure)     a. on home milrinone - dose titrate to 0.373mcg/kg/min 03/2014.  Marland Kitchen HTN (hypertension)   . Morbid obesity with BMI of 50.0-59.9, adult   . Dyslipidemia   . CKD (chronic kidney disease), stage III   . Asthma 01/03/2014  . Hypokalemia 01/04/2014  . Hyponatremia 02/25/2014  . Microcytic anemia 01/04/2014  . 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 Normal coronary arteries- last cath Oct 2014 at [facility] 01/03/2014  . History of chicken pox   . Perirectal abscess 03/2014  . Allergic rhinitis, cause unspecified 04/29/2014  . Shortness of breath   . Anxiety   . Chronic bronchitis     "get it q yr" (05/15/2014)  . Sleep apnea     on Bi Pap  . Diabetes     "prediabetic" (05/15/2014)  . DKA (diabetic ketoacidoses) 02/25/2014    Medications:  Albuterol  Amiodarone  ASA  Lipitor  Bumex  Coreg  Digoxin  Hydralazine  Novolog  Lantus  Imdur  Claritin  Magox  Milrinone  Percocet  Protonix  KCl  Phenergan  Assessment: 38 yo male with SOB, possible PE, for Lovenox.  Lovenox 160 mg SQ given in ED at 0530  Goal of Therapy:  Full anticoagulation with Lovenox Monitor platelets by anticoagulation protocol: Yes   Plan:  Lovenox 150 mg SQ q12h F/U VQ scan  results  Eddie Candle 06/04/2014,6:19 AM

## 2014-06-04 NOTE — H&P (Signed)
[facility] History and Physical  [employer] [id number] DOB: [birth year] DOA: 06/03/2014  Referring physician:  EDP PCP: Oliver Barre, MD  Specialists:   Chief Complaint: Chest Pain SOB, and Pain in Both Legs  HPI: Steven Wyatt. is a 38 y.o. male with a history of Chronic Systolic CHF NYHA Class IV on a continuous Milrinone Drip, DM2, HTN, and CKD Stage III who presents to the ED with complaints of sudden onset of worsening of his SOB  with DOE, and chest pain this evening.  He describes the chest discomfort as intermittent and int he mid-chest and a feeling as if his chest was pounding and heavy.   He had increased SOB wth the episodes.   He reports that he has had a weight gain of fluid of 15 pounds over the last week.   He has also been in the process of having his medications adjusted by the Cardiologist and CHF Clinic.  He reports muscle cramping in his legs.  He was brought to the ED and was evaluated and found to have a BNP =1808.0, as well as vascular congestion and cardiomegaly on Chest X-Ray, and Hypokalemia with a K+=2.8, and and elevated D-dimer at 1. 54.   He was administered IV Bumex, and started K+ replacement as well as started on full dose Lovenox until a V/Q scan has been done to rule out a possible risk of PE.     Review of Systems:  Constitutional: No Weight Loss, +Weight Gain, Night Sweats, Fevers, Chills, Fatigue, or Generalized Weakness HEENT: No Headaches, Difficulty Swallowing,Tooth/Dental Problems,Sore Throat,  No Sneezing, Rhinitis, Ear Ache, Nasal Congestion, or Post Nasal Drip,  Cardio-vascular:  +Chest pain, +Orthopnea, PND, +Edema in lower extremities, Anasarca, Dizziness, Palpitations  Resp: +Dyspnea, +DOE, No Cough, No Hemoptysis,  No Wheezing.    GI: No Heartburn, Indigestion, Abdominal Pain, Nausea, Vomiting, Diarrhea, Change in Bowel Habits,  Loss of Appetite  GU: No Dysuria, Change in Color of Urine, No Urgency or Frequency.  No flank pain.   Musculoskeletal: No Joint Pain or Swelling.  No Decreased Range of Motion. No Back Pain.  Neurologic: No Syncope, No Seizures, Muscle Weakness, Paresthesia, Vision Disturbance or Loss, No Diplopia, No Vertigo, No Difficulty Walking,  Skin: No Rash or Lesions. Psych: No Change in Mood or Affect. No Depression or Anxiety. No Memory loss. No Confusion or Hallucinations   Past Medical History  Diagnosis Date  . Chronic systolic CHF (congestive heart failure)     a. on home milrinone - dose titrate to 0.324mcg/kg/min 03/2014.  Marland Kitchen HTN (hypertension)   . Morbid obesity with BMI of 50.0-59.9, adult   . Dyslipidemia   . CKD (chronic kidney disease), stage III   . Asthma 01/03/2014  . Hypokalemia 01/04/2014  . Hyponatremia 02/25/2014  . Microcytic anemia 01/04/2014  . 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 Normal coronary arteries- last cath Oct 2014 [facility] 01/03/2014  . History of chicken pox   . Perirectal abscess 03/2014  . Allergic rhinitis, cause unspecified 04/29/2014  . Shortness of breath   . Anxiety   . Chronic bronchitis     "get it q yr" (05/15/2014)  . Sleep apnea     on Bi Pap  . Diabetes     "prediabetic" (05/15/2014)  . DKA (diabetic ketoacidoses) 02/25/2014    Past Surgical History  Procedure Laterality Date  . Incision and drainage abscess N/A 03/31/2014    Procedure: INCISION AND DRAINAGE  PERI-RECTAL ABSCESS;  Surgeon: Axel FillerArmando Ramirez, MD;  [facility];  Service: General;  Laterality: N/A;  . [facility]  [facility] reportedly [facility], no further details available     Prior to Admission medications   Medication Sig Start Date End Date Taking? Authorizing Provider  albuterol (PROVENTIL HFA;VENTOLIN HFA) 108 (90 BASE) MCG/ACT inhaler Inhale 2 puffs into the lungs daily as needed for wheezing or shortness of breath. 01/08/14  Yes Wilburt FinlayBryan Hager, PA-C  amiodarone (PACERONE) 400 MG tablet Take 0.5 tablets (200 mg total)  by mouth 2 (two) times daily. 05/26/14  Yes Laurey Moralealton S McLean, MD  aspirin EC 81 MG tablet Take 81 mg by mouth daily.   Yes Historical Provider, MD  atorvastatin (LIPITOR) 80 MG tablet Take 80 mg by mouth daily. 01/08/14  Yes Wilburt FinlayBryan Hager, PA-C  bumetanide (BUMEX) 2 MG tablet Take 2.5 tablets (5 mg total) by mouth 2 (two) times daily. 05/26/14  Yes Laurey Moralealton S McLean, MD  carvedilol (COREG) 3.125 MG tablet Take 1 tablet (3.125 mg total) by mouth 2 (two) times daily. 02/20/14  Yes Dolores Pattyaniel R Bensimhon, MD  digoxin (LANOXIN) 0.125 MG tablet Take 0.5 tablets (0.0625 mg total) by mouth daily. 05/29/14  Yes Dolores Pattyaniel R Bensimhon, MD  hydrALAZINE (APRESOLINE) 25 MG tablet Take 1 tablet (25 mg total) by mouth every 8 (eight) hours. 01/23/14  Yes Amy D Clegg, NP  insulin aspart (NOVOLOG FLEXPEN) 100 UNIT/ML FlexPen Inject 1-15 Units into the skin 3 (three) times daily with meals. Sliding scale 04/27/14  Yes Corwin LevinsJames W John, MD  insulin glargine (LANTUS) 100 UNIT/ML injection Inject 0.25 mLs (25 Units total) into the skin at bedtime. 04/27/14  Yes Corwin LevinsJames W John, MD  isosorbide mononitrate (IMDUR) 60 MG 24 hr tablet Take 1 tablet (60 mg total) by mouth daily. 05/18/14  Yes Aundria RudAli B Cosgrove, NP  loratadine (CLARITIN) 10 MG tablet Take 1 tablet (10 mg total) by mouth daily. 04/27/14  Yes Corwin LevinsJames W John, MD  magnesium oxide (MAG-OX) 400 MG tablet Take 1 tablet (400 mg total) by mouth 2 (two) times daily. 03/07/14  Yes Aundria RudAli B Cosgrove, NP  milrinone [facility](PRIMACOR) 20 MG/100ML SOLN infusion Inject 62.7375 mcg/min into the vein continuous. 04/15/14  Yes Ok Anishristopher R Berge, NP  oxyCODONE-acetaminophen (PERCOCET/ROXICET) 5-325 MG per tablet Take 1 tablet by mouth every 6 (six) hours as needed for severe pain. 05/23/14  Yes Jamesetta Orleanshristopher W Lawyer, PA-C  pantoprazole (PROTONIX) 40 MG tablet Take 1 tablet (40 mg total) by mouth daily. 05/18/14  Yes Aundria RudAli B Cosgrove, NP  potassium chloride SA (K-DUR,KLOR-CON) 20 MEQ tablet Take 3 tablets (60 mEq total) by mouth 2 (two)  times daily. 05/26/14  Yes Laurey Moralealton S McLean, MD  promethazine (PHENERGAN) 25 MG tablet Take 1 tablet (25 mg total) by mouth every 6 (six) hours as needed for nausea or vomiting. 05/23/14  Yes Jamesetta Orleanshristopher W Lawyer, PA-C    Allergies  Allergen Reactions  . Iodine Anaphylaxis  . Shellfish Allergy Anaphylaxis    Social History:  Married with Children  reports that he has never smoked. He has never used smokeless tobacco. He reports that he does not drink alcohol or use illicit drugs.     Family History  Problem Relation Age of Onset  . Hypertension Mother   . Diabetes Maternal Grandmother   . Liver disease Father       Physical Exam:  GEN:  Pleasant Morbidly Obese 38 y.o. African American  male  examined and  in no acute distress; cooperative with exam Filed Vitals:   06/04/14 0400 06/04/14 0415 06/04/14 0430 06/04/14 0445  BP: 88/74 106/64 107/67 102/70  Pulse: 98 97 96 96  Temp:      TempSrc:      Resp: 26 23 21 20   Height:      Weight:      SpO2: 99% 99% 98% 98%   Blood pressure 102/70, pulse 96, temperature 98.2 F (36.8 C), temperature source Oral, resp. rate 20, height 5\' 9"  (1.753 m), weight 162.388 kg (358 lb), SpO2 98.00%. PSYCH: He is alert and oriented x4; does not appear anxious does not appear depressed; affect is normal HEENT: Normocephalic and Atraumatic, Mucous membranes pink; PERRLA; EOM intact; Fundi:  Benign;  No scleral icterus, Nares: Patent, Oropharynx: Clear, Fair Dentition, Neck:  FROM, no cervical lymphadenopathy nor thyromegaly or carotid bruit; no JVD; Breasts:: Not examined CHEST WALL: No tenderness CHEST: Decreased Breath Sounds, + BiBasilar Rales,  No Rhonchi No Wheezes.   HEART: Regular rate and rhythm; no murmurs rubs or gallops BACK: No kyphosis or scoliosis; no CVA tenderness ABDOMEN: Positive Bowel Sounds, Obese, soft non-tender; no masses, no organomegaly. Rectal Exam: Not done EXTREMITIES: + EDEMA and Chronicn venous Stasis Changes of BLEs, No  ulcerations. Genitalia: not examined PULSES: 2+ and symmetric SKIN: Normal hydration no rash or ulceration CNS:  Alert and Oriented x 4, No focal Deficits.   Vascular: pulses palpable throughout    Labs on Admission:  Basic Metabolic Panel:  Recent Labs Lab 06/04/14 0010  NA 136*  K 2.8*  CL 90*  CO2 28  GLUCOSE 118*  BUN 40*  CREATININE 1.89*  CALCIUM 9.2   Liver Function Tests: No results found for this basename: AST, ALT, ALKPHOS, BILITOT, PROT, ALBUMIN,  in the last 168 hours No results found for this basename: LIPASE, AMYLASE,  in the last 168 hours No results found for this basename: AMMONIA,  in the last 168 hours CBC:  Recent Labs Lab 06/04/14 0010  WBC 4.2  NEUTROABS 3.1  HGB 8.6*  HCT 27.0*  MCV 75.2*  PLT 346   Cardiac Enzymes:  Recent Labs Lab 06/04/14 0010  TROPONINI <0.30    BNP (last 3 results)  Recent Labs  05/15/14 1200 05/23/14 0600 06/04/14 0010  PROBNP 2056.0* 1770.0* 1808.0*   CBG: No results found for this basename: GLUCAP,  in the last 168 hours  Radiological Exams on Admission: Dg Chest Port 1 View  06/04/2014   CLINICAL DATA:  Chest, abdominal and bilateral leg pain. Shortness of breath.  EXAM: PORTABLE CHEST - 1 VIEW  COMPARISON:  Chest radiograph performed 05/23/2014  FINDINGS: The lungs are well-aerated. Vascular congestion is noted. Mildly increased interstitial markings could reflect minimal interstitial edema. No pleural effusion or pneumothorax is seen.  The cardiomediastinal silhouette is borderline enlarged. No acute osseous abnormalities are seen. A right PICC is noted ending about the proximal to mid SVC.  IMPRESSION: Vascular congestion and borderline cardiomegaly. Mildly increased interstitial markings could reflect minimal interstitial edema.   Electronically Signed   By: Roanna Raider M.D.   On: 06/04/2014 01:28     EKG: Independently reviewed. Normal Sinus Rhythm     Assessment/Plan:   38 y.o. male with  Acute on Chronic Systolic CHF Active Problems:   Hypokalemia   Acute on chronic systolic and diastolic heart failure, NYHA class 4   Chest pain   Elevated d-dimer   NICM (nonischemic cardiomyopathy)   Normal coronary arteries-  last cath Oct 2014 at [facility]   Morbid obesity with BMI of 50.0-59.9, adult   Sleep apnea- he reports comliance with Bi Pap   HTN (hypertension)   Dyslipidemia   CKD (chronic kidney disease), stage III   Diabetes mellitus    1.       Acute on Chronic Systolic CHF NYHA Class 4-   Admit to Stepdown BED,  Cardiac Monitoring, BIPAP PRN, and diurese with IV Bumex TID x 6 doses.      Replete K+ and follow K+ Levels.   Continue on Carvedilol, Digoxin, and Milrinone Infusion.      2.      Chest Pain-   Cycle Troponins,    First 2 Troponins < 0.30,  Continue Imdur Rx.     3.      Hypokalemia-   Deficient due to diuresis,  Replete K+ and check Magnesium level.    4.      Elevated D-Dimer-   VQ scan ordered for AM.   Full dose Lovenox RX until V/Q  Scan results.    5.      Non Ischemic Cardiomyopathy (NICM)-  Normal Coronary arteries on Cadiac Cath at [facility] in 09/2013.  continue Imdur Rx.    6.      DM2-  On Lantus Insulin RX qhs,  Continue, and SSI coverage added PRN Elevated Glucose levels.  Check HbA1c in AM.    7.      HTN- monitor BPs.    8.      CKD  Stage III- Monitor BUN/Cr levels.     9.      OSA-  On BIPAP qhs,    10.    Morbid Obesity-  Needs Weight Loss due to Co-morbidities.    11.   Anemia-  Anemia of Chronic Disease,  Send Anemia panel.       Code Status:  FULL CODE  Family Communication:    Wife at Bedside Disposition Plan:      Inpatient to Stepdown Bed  Time spent:   6 Minutes  Ron Parker [facility] Pager [phone]  If 7PM-7AM, please contact night-coverage [email or web address] Password [facility] 06/04/2014, 5:44 AM

## 2014-06-04 NOTE — Progress Notes (Signed)
Pt placing himself on and off BiPap. States he is comfortable with the machine and prefers to take it on and off as he is sleeping and awake. Informed pt to call with any concerns, pt verbalizes understanding. Will continue to monitor.

## 2014-06-04 NOTE — ED Notes (Signed)
Dr. Jenkins at bedside. 

## 2014-06-04 NOTE — ED Provider Notes (Signed)
CSN: [id number]     Arrival date & time 06/03/14  2327 History   First MD Initiated Contact with Patient 06/04/14 0027     Chief Complaint  Patient presents with  . Chest Pain     (Consider location/radiation/quality/duration/timing/severity/associated sxs/prior Treatment) Patient is a 38 y.o. male presenting with chest pain. The history is provided by the patient, the spouse and the EMS personnel.  Chest Pain  He presents for evaluation of the chest pain. The pain started tonight. He, states that his weight is 15 pounds higher than his baseline. He was recently admitted and treated for anemia. He skipped his Bumex today because his back was hurting and he felt that he may not need it. He had had a decreased appetite recently, and not been eating as well as usual. He describes the chest pain, as both a pounding sensation, and a sharp pain. He's had similar pain to this in the past. He had a cardiac catheterization in October 2014 that showed normal coronary arteries. He uses BiPAP, for sleep apnea. He is followed in the heart failure clinic. He denies shortness of breath, cough, abdominal pain, or dizziness. He feels like he is accumulating fluid in his legs. He is taking his other medicines, as prescribed. He, states that 4 days ago, he was tested for hypoxia, at home and found to be hypoxic. His cardiologist, has not yet written a prescription for home oxygen. There are no other known modifying factors.  Past Medical History  Diagnosis Date  . Chronic systolic CHF (congestive heart failure)     a. on home milrinone - dose titrate to 0.365mcg/kg/min 03/2014.  Marland Kitchen HTN (hypertension)   . Morbid obesity with BMI of 50.0-59.9, adult   . Dyslipidemia   . CKD (chronic kidney disease), stage III   . Asthma 01/03/2014  . Hypokalemia 01/04/2014  . Hyponatremia 02/25/2014  . Microcytic anemia 01/04/2014  . 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 Normal coronary arteries- last cath Oct 2014 at  [facility] 01/03/2014  . History of chicken pox   . Perirectal abscess 03/2014  . Allergic rhinitis, cause unspecified 04/29/2014  . Shortness of breath   . Anxiety   . Chronic bronchitis     "get it q yr" (05/15/2014)  . Sleep apnea     on Bi Pap  . Diabetes     "prediabetic" (05/15/2014)  . DKA (diabetic ketoacidoses) 02/25/2014   Past Surgical History  Procedure Laterality Date  . Incision and drainage abscess N/A 03/31/2014    Procedure: INCISION AND DRAINAGE  PERI-RECTAL ABSCESS;  Surgeon: Axel Filler, MD;  Location: [facility] OR;  Service: General;  Laterality: N/A;  . Cardiac catheterization  October 2014    [facility]; reportedly clean, no further details available   Family History  Problem Relation Age of Onset  . Hypertension Mother   . Diabetes Maternal Grandmother   . Liver disease Father    History  Substance Use Topics  . Smoking status: Never Smoker   . Smokeless tobacco: Never Used  . Alcohol Use: No    Review of Systems  Cardiovascular: Positive for chest pain.  All other systems reviewed and are negative.     Allergies  Iodine and Shellfish allergy  Home Medications   Prior to Admission medications   Medication Sig Start Date End Date Taking? Authorizing Provider  albuterol (PROVENTIL HFA;VENTOLIN HFA) 108 (90 BASE) MCG/ACT inhaler Inhale 2 puffs into the lungs daily as needed for wheezing or shortness  of breath. 01/08/14  Yes Wilburt Finlay, PA-C  amiodarone (PACERONE) 400 MG tablet Take 0.5 tablets (200 mg total) by mouth 2 (two) times daily. 05/26/14  Yes Laurey Morale, MD  aspirin EC 81 MG tablet Take 81 mg by mouth daily.   Yes Historical Provider, MD  atorvastatin (LIPITOR) 80 MG tablet Take 80 mg by mouth daily. 01/08/14  Yes Wilburt Finlay, PA-C  bumetanide (BUMEX) 2 MG tablet Take 2.5 tablets (5 mg total) by mouth 2 (two) times daily. 05/26/14  Yes Laurey Morale, MD  carvedilol (COREG) 3.125 MG tablet Take 1 tablet (3.125 mg total) by  mouth 2 (two) times daily. 02/20/14  Yes Dolores Patty, MD  digoxin (LANOXIN) 0.125 MG tablet Take 0.5 tablets (0.0625 mg total) by mouth daily. 05/29/14  Yes Dolores Patty, MD  hydrALAZINE (APRESOLINE) 25 MG tablet Take 1 tablet (25 mg total) by mouth every 8 (eight) hours. 01/23/14  Yes Amy D Clegg, NP  insulin aspart (NOVOLOG FLEXPEN) 100 UNIT/ML FlexPen Inject 1-15 Units into the skin 3 (three) times daily with meals. Sliding scale 04/27/14  Yes Corwin Levins, MD  insulin glargine (LANTUS) 100 UNIT/ML injection Inject 0.25 mLs (25 Units total) into the skin at bedtime. 04/27/14  Yes Corwin Levins, MD  isosorbide mononitrate (IMDUR) 60 MG 24 hr tablet Take 1 tablet (60 mg total) by mouth daily. 05/18/14  Yes Aundria Rud, NP  loratadine (CLARITIN) 10 MG tablet Take 1 tablet (10 mg total) by mouth daily. 04/27/14  Yes Corwin Levins, MD  magnesium oxide (MAG-OX) 400 MG tablet Take 1 tablet (400 mg total) by mouth 2 (two) times daily. 03/07/14  Yes Aundria Rud, NP  milrinone [facility]) 20 MG/100ML SOLN infusion Inject 62.7375 mcg/min into the vein continuous. 04/15/14  Yes Ok Anis, NP  oxyCODONE-acetaminophen (PERCOCET/ROXICET) 5-325 MG per tablet Take 1 tablet by mouth every 6 (six) hours as needed for severe pain. 05/23/14  Yes Jamesetta Orleans Lawyer, PA-C  pantoprazole (PROTONIX) 40 MG tablet Take 1 tablet (40 mg total) by mouth daily. 05/18/14  Yes Aundria Rud, NP  potassium chloride SA (K-DUR,KLOR-CON) 20 MEQ tablet Take 3 tablets (60 mEq total) by mouth 2 (two) times daily. 05/26/14  Yes Laurey Morale, MD  promethazine (PHENERGAN) 25 MG tablet Take 1 tablet (25 mg total) by mouth every 6 (six) hours as needed for nausea or vomiting. 05/23/14  Yes Jamesetta Orleans Lawyer, PA-C   BP 97/77  Pulse 49  Temp(Src) 98.2 F (36.8 C) (Oral)  Resp 20  Ht 5\' 9"  (1.753 m)  Wt 358 lb (162.388 kg)  BMI 52.84 kg/m2  SpO2 97% Physical Exam  Nursing note and vitals reviewed. Constitutional: He is  oriented to person, place, and time. He appears well-developed.  Overweight  HENT:  Head: Normocephalic and atraumatic.  Right Ear: External ear normal.  Left Ear: External ear normal.  Eyes: Conjunctivae and EOM are normal. Pupils are equal, round, and reactive to light.  Neck: Normal range of motion and phonation normal. Neck supple.  Cardiovascular: Normal rate, regular rhythm, normal heart sounds and intact distal pulses.   Pulmonary/Chest: Effort normal. No respiratory distress. He has no wheezes. He exhibits no bony tenderness.  Somewhat decreased air movement bilaterally  Abdominal: Soft. There is no tenderness.  Musculoskeletal: Normal range of motion.  Chronic lymphedema both legs.  Neurological: He is alert and oriented to person, place, and time. No cranial nerve deficit or sensory deficit.  He exhibits normal muscle tone. Coordination normal.  Skin: Skin is warm, dry and intact.  Psychiatric: He has a normal mood and affect. His behavior is normal. Judgment and thought content normal.    ED Course  Procedures (including critical care time)  Medications  potassium chloride 10 mEq in 100 mL IVPB (10 mEq Intravenous New Bag/Given 06/04/14 0404)  HYDROmorphone (DILAUDID) injection 1 mg (not administered)  bumetanide (BUMEX) injection 1 mg (1 mg Intravenous Given 06/04/14 0059)  HYDROmorphone (DILAUDID) injection 1 mg (1 mg Intravenous Given 06/04/14 0059)  ondansetron (ZOFRAN) injection 4 mg (4 mg Intravenous Given 06/04/14 0059)  potassium chloride SA (K-DUR,KLOR-CON) CR tablet 40 mEq (40 mEq Oral Given 06/04/14 0143)    Patient Vitals for the past 24 hrs:  BP Temp Temp src Pulse Resp SpO2 Height Weight  06/04/14 0300 97/77 mmHg - - 49 20 97 % - -  06/04/14 0245 105/85 mmHg - - 96 20 98 % - -  06/04/14 0230 111/86 mmHg - - 96 18 97 % - -  06/04/14 0215 109/85 mmHg - - 58 21 98 % - -  06/04/14 0145 95/72 mmHg - - 96 23 100 % - -  06/04/14 0100 99/67 mmHg - - 90 23 100 % - -   06/04/14 0033 94/77 mmHg - - 94 19 99 % - -  06/04/14 0030 94/77 mmHg - - 94 27 100 % - -  06/04/14 0015 107/64 mmHg - - 90 26 100 % - -  06/03/14 2345 95/69 mmHg - - 94 24 95 % - -  06/03/14 2342 - - - - - 98 % - -  06/03/14 2341 96/74 mmHg 98.2 F (36.8 C) Oral 95 18 98 % 5\' 9"  (1.753 m) 358 lb (162.388 kg)  06/03/14 2335 - - - - - 98 % - -    4:23 AM Reevaluation with update and discussion. After initial assessment and treatment, an updated evaluation reveals . Artist Bloom L   04:15- discussed with cardiology, Dr. Mayford Knife, case reviewed with her, she states that the patient needs to be admitted to a hospital. Service, with cardiology consultation, in the morning.  4:23 AM-Consult complete with Dr. Lovell Sheehan. Patient case explained and discussed. She agrees to admit patient for further evaluation and treatment. Call ended at 0428   Labs Review Labs Reviewed  CBC WITH DIFFERENTIAL - Abnormal; Notable for the following:    RBC 3.59 (*)    Hemoglobin 8.6 (*)    HCT 27.0 (*)    MCV 75.2 (*)    MCH 24.0 (*)    RDW 17.3 (*)    All other components within normal limits  BASIC METABOLIC PANEL - Abnormal; Notable for the following:    Sodium 136 (*)    Potassium 2.8 (*)    Chloride 90 (*)    Glucose, Bld 118 (*)    BUN 40 (*)    Creatinine, Ser 1.89 (*)    GFR calc non Af Amer 44 (*)    GFR calc Af Amer 52 (*)    All other components within normal limits  PRO B NATRIURETIC PEPTIDE - Abnormal; Notable for the following:    Pro B Natriuretic peptide (BNP) 1808.0 (*)    All other components within normal limits  D-DIMER, QUANTITATIVE - Abnormal; Notable for the following:    D-Dimer, Quant 1.54 (*)    All other components within normal limits  URINE CULTURE  TROPONIN I  URINALYSIS, ROUTINE W REFLEX  MICROSCOPIC   Component     Latest Ref Rng 05/23/2014 06/04/2014           Sodium     137 - 147 mEq/L 135 (L) 136 (L)  Potassium     3.7 - 5.3 mEq/L 3.3 (L) 2.8 (LL)  Chloride      96 - 112 mEq/L 93 (L) 90 (L)  CO2     19 - 32 mEq/L 26 28  Glucose     70 - 99 mg/dL 409116 (H) 811118 (H)  BUN     6 - 23 mg/dL 27 (H) 40 (H)  Creatinine     0.50 - 1.35 mg/dL 9.141.48 (H) 7.821.89 (H)  Calcium     8.4 - 10.5 mg/dL 9.1 9.2  GFR calc non Af Amer     >90 mL/min 60 (L) 44 (L)  GFR calc Af Amer     >90 mL/min 69 (L) 52 (L)    Imaging Review Dg Chest Port 1 View  06/04/2014   CLINICAL DATA:  Chest, abdominal and bilateral leg pain. Shortness of breath.  EXAM: PORTABLE CHEST - 1 VIEW  COMPARISON:  Chest radiograph performed 05/23/2014  FINDINGS: The lungs are well-aerated. Vascular congestion is noted. Mildly increased interstitial markings could reflect minimal interstitial edema. No pleural effusion or pneumothorax is seen.  The cardiomediastinal silhouette is borderline enlarged. No acute osseous abnormalities are seen. A right PICC is noted ending about the proximal to mid SVC.  IMPRESSION: Vascular congestion and borderline cardiomegaly. Mildly increased interstitial markings could reflect minimal interstitial edema.   Electronically Signed   By: Roanna RaiderJeffery  Chang M.D.   On: 06/04/2014 01:28      Date: 06/04/14  Rate: 95  Rhythm: normal sinus rhythm  QRS Axis: normal  PR and QT Intervals: PR prolonged  ST/T Wave abnormalities: normal  PR and QRS Conduction Disutrbances:nonspecific intraventricular conduction delay  Narrative Interpretation:   Old EKG Reviewed: unchanged- 05/23/14   EKG Interpretation None      MDM   Final diagnoses:  Chest pain  Weight gain  Congestive heart failure  Hypokalemia  Renal insufficiency    Nonspecific chest pain, with fluid overload, and ongoing renal insufficiency. BNP, is at baseline. Chest x-ray indicates vascular congestion. D-dimer is elevated, however, I cannot rule out PE, tonight because of his renal insufficiency. He has anemia that is persistent, and gradually drifting down; however, still above when he was transfused, 3 weeks  ago. He will require admission for gradual diuresis, and observation with cardiology consultation.  Nursing Notes Reviewed/ Care Coordinated, and agree without changes. Applicable Imaging Reviewed.  Interpretation of Laboratory Data incorporated into ED treatment  Plan: Admit    Flint MelterElliott L Kiyoshi Schaab, MD 06/04/14 [phone]

## 2014-06-05 ENCOUNTER — Encounter (HOSPITAL_COMMUNITY): Payer: Medicare Other

## 2014-06-05 DIAGNOSIS — R0602 Shortness of breath: Secondary | ICD-10-CM

## 2014-06-05 DIAGNOSIS — D509 Iron deficiency anemia, unspecified: Secondary | ICD-10-CM

## 2014-06-05 DIAGNOSIS — I369 Nonrheumatic tricuspid valve disorder, unspecified: Secondary | ICD-10-CM

## 2014-06-05 DIAGNOSIS — I5043 Acute on chronic combined systolic (congestive) and diastolic (congestive) heart failure: Principal | ICD-10-CM

## 2014-06-05 DIAGNOSIS — I10 Essential (primary) hypertension: Secondary | ICD-10-CM

## 2014-06-05 DIAGNOSIS — R079 Chest pain, unspecified: Secondary | ICD-10-CM

## 2014-06-05 LAB — CARBOXYHEMOGLOBIN
CARBOXYHEMOGLOBIN: 2.2 % — AB (ref 0.5–1.5)
METHEMOGLOBIN: 0.9 % (ref 0.0–1.5)
O2 Saturation: 70.9 %
TOTAL HEMOGLOBIN: 8.7 g/dL — AB (ref 13.5–18.0)

## 2014-06-05 LAB — BASIC METABOLIC PANEL
BUN: 46 mg/dL — AB (ref 6–23)
CO2: 28 mEq/L (ref 19–32)
Calcium: 9 mg/dL (ref 8.4–10.5)
Chloride: 88 mEq/L — ABNORMAL LOW (ref 96–112)
Creatinine, Ser: 2.26 mg/dL — ABNORMAL HIGH (ref 0.50–1.35)
GFR calc Af Amer: 42 mL/min — ABNORMAL LOW (ref >90)
GFR, EST NON AFRICAN AMERICAN: 36 mL/min — AB (ref >90)
GLUCOSE: 127 mg/dL — AB (ref 70–99)
Potassium: 3.2 mEq/L — ABNORMAL LOW (ref 3.7–5.3)
SODIUM: 133 meq/L — AB (ref 137–147)

## 2014-06-05 LAB — CBC
HEMATOCRIT: 26.6 % — AB (ref 39.0–52.0)
Hemoglobin: 8.5 g/dL — ABNORMAL LOW (ref 13.0–17.0)
MCH: 23.7 pg — ABNORMAL LOW (ref 26.0–34.0)
MCHC: 32 g/dL (ref 30.0–36.0)
MCV: 74.3 fL — AB (ref 78.0–100.0)
Platelets: 351 10*3/uL (ref 150–400)
RBC: 3.58 MIL/uL — ABNORMAL LOW (ref 4.22–5.81)
RDW: 17.3 % — AB (ref 11.5–15.5)
WBC: 5.4 10*3/uL (ref 4.0–10.5)

## 2014-06-05 LAB — RETICULOCYTES
RBC.: 3.68 MIL/uL — AB (ref 4.22–5.81)
RETIC COUNT ABSOLUTE: 73.6 10*3/uL (ref 19.0–186.0)
Retic Ct Pct: 2 % (ref 0.4–3.1)

## 2014-06-05 LAB — IRON AND TIBC
Iron: 20 ug/dL — ABNORMAL LOW (ref 42–135)
Saturation Ratios: 5 % — ABNORMAL LOW (ref 20–55)
TIBC: 431 ug/dL (ref 215–435)
UIBC: 411 ug/dL — ABNORMAL HIGH (ref 125–400)

## 2014-06-05 LAB — FERRITIN: Ferritin: 55 ng/mL (ref 22–322)

## 2014-06-05 LAB — HEMOGLOBIN A1C
Hgb A1c MFr Bld: 7.6 % — ABNORMAL HIGH (ref <5.7)
MEAN PLASMA GLUCOSE: 171 mg/dL — AB (ref <117)

## 2014-06-05 LAB — URINE CULTURE: Colony Count: 60000

## 2014-06-05 LAB — GLUCOSE, CAPILLARY
GLUCOSE-CAPILLARY: 123 mg/dL — AB (ref 70–99)
GLUCOSE-CAPILLARY: 170 mg/dL — AB (ref 70–99)
Glucose-Capillary: 173 mg/dL — ABNORMAL HIGH (ref 70–99)
Glucose-Capillary: 207 mg/dL — ABNORMAL HIGH (ref 70–99)
Glucose-Capillary: 146 mg/dL — ABNORMAL HIGH (ref 70–99)
Glucose-Capillary: 152 mg/dL — ABNORMAL HIGH (ref 70–99)

## 2014-06-05 LAB — TROPONIN I: Troponin I: <0.3 ng/mL (ref <0.30)

## 2014-06-05 LAB — VITAMIN B12: VITAMIN B 12: 770 pg/mL (ref 211–911)

## 2014-06-05 MED ORDER — ALTEPLASE 2 MG IJ SOLR
2.0000 mg | Freq: Once | INTRAMUSCULAR | Status: AC
  Administered 2014-06-06: 2 mg
  Filled 2014-06-05: qty 2

## 2014-06-05 MED ORDER — POTASSIUM CHLORIDE CRYS ER 20 MEQ PO TBCR
40.0000 meq | EXTENDED_RELEASE_TABLET | Freq: Once | ORAL | Status: AC
  Administered 2014-06-05: 40 meq via ORAL
  Filled 2014-06-05: qty 2

## 2014-06-05 MED ORDER — DEXTROSE 5 % IV SOLN
30.0000 mg/h | INTRAVENOUS | Status: DC
  Administered 2014-06-05: 15 mg/h via INTRAVENOUS
  Administered 2014-06-06 – 2014-06-13 (×16): 30 mg/h via INTRAVENOUS
  Filled 2014-06-05 (×46): qty 25

## 2014-06-05 MED ORDER — HEPARIN SODIUM (PORCINE) 5000 UNIT/ML IJ SOLN
5000.0000 [IU] | Freq: Three times a day (TID) | INTRAMUSCULAR | Status: DC
  Filled 2014-06-05 (×8): qty 1

## 2014-06-05 MED ORDER — FUROSEMIDE 10 MG/ML IJ SOLN
80.0000 mg | Freq: Once | INTRAMUSCULAR | Status: DC
  Filled 2014-06-05: qty 8

## 2014-06-05 MED ORDER — PERFLUTREN LIPID MICROSPHERE
1.0000 mL | INTRAVENOUS | Status: AC | PRN
  Administered 2014-06-05: 2 mL via INTRAVENOUS
  Filled 2014-06-05: qty 10

## 2014-06-05 MED ORDER — POTASSIUM CHLORIDE CRYS ER 20 MEQ PO TBCR
20.0000 meq | EXTENDED_RELEASE_TABLET | Freq: Two times a day (BID) | ORAL | Status: AC
  Administered 2014-06-05 (×2): 20 meq via ORAL

## 2014-06-05 NOTE — Progress Notes (Signed)
Pharmacist Heart Failure Core Measure Documentation  Assessment: Steven Wyatt. has an EF documented as 15% on 6/15 by Echo.  Rationale: Heart failure patients with left ventricular systolic dysfunction (LVSD) and an EF < 40% should be prescribed an angiotensin converting enzyme inhibitor (ACEI) or angiotensin receptor blocker (ARB) at discharge unless a contraindication is documented in the medical record.  This patient is not currently on an ACEI or ARB for HF.  This note is being placed in the record in order to provide documentation that a contraindication to the use of these agents is present for this encounter.  ACE Inhibitor or Angiotensin Receptor Blocker is contraindicated (specify all that apply)  []   ACEI allergy AND ARB allergy []   Angioedema []   Moderate or severe aortic stenosis []   Hyperkalemia []   Hypotension []   Renal artery stenosis [x]   Worsening renal function, preexisting renal disease or dysfunction   [name] 06/05/2014 3:08 PM

## 2014-06-05 NOTE — Progress Notes (Signed)
Pt has nasal Bipap set up in room, 40% 22/16, per home settings. Pt is able to place on and off by himself, when needed.. Pt encouraged to call RT if needing any assistance. Pt in no distress at this time. Family at bedside.

## 2014-06-05 NOTE — Progress Notes (Signed)
Advanced Heart Failure Rounding Note   Subjective:    38 yr old AAM with a history of NICM (nor cors Oct 2014), chronic systolic HF ECHO 12/5377 EF 20% on chronic home milrinone via PICC, morbid obesity, OSA on BIPAP, HTN, CKD uncontrolled DM, and HLD. He has been followed closely in HF clinic chronic home Milrinone 0.375 mcg , recent d/c from hospital on 05/18/2014 .   Prior to admit he says he did not take bumex on Saturday because he had flank pain.   Readmitted to [facility] with increased dyspnea and leg pain. Pro BNP 1808 and CEs negative. VQ indeterminate for PE.  He has been diuresing with IV bumex.    Mild dyspnea but says he is feeling better     Objective:   Weight Range:  Vital Signs:   Temp:  [97.5 F (36.4 C)-98.3 F (36.8 C)] 97.9 F (36.6 C) (06/15 0747) Pulse Rate:  [83-92] 83 (06/15 0747) Resp:  [15-21] 19 (06/15 0747) BP: (93-115)/(56-91) 115/72 mmHg (06/15 0747) SpO2:  [95 %-100 %] 100 % (06/15 0747) Weight:  [360 lb 3.7 oz (163.4 kg)-361 lb 1.8 oz (163.8 kg)] 360 lb 3.7 oz (163.4 kg) (06/15 0900) Last BM Date: 06/03/14  Weight change: Filed Weights   06/04/14 0654 06/05/14 0313 06/05/14 0900  Weight: 361 lb 1.8 oz (163.8 kg) 361 lb 1.8 oz (163.8 kg) 360 lb 3.7 oz (163.4 kg)    Intake/Output:   Intake/Output Summary (Last 24 hours) at 06/05/14 1053 Last data filed at 06/05/14 0930  Gross per 24 hour  Intake   1386 ml  Output   1700 ml  Net   -314 ml     Physical Exam: CVP 25  General:  Well appearing. No resp difficulty. Wife present.  HEENT: normal Neck: supple. JVP difficult to assess due to body habitus. Carotids 2+ bilat; no bruits. No lymphadenopathy or thryomegaly appreciated. Cor: PMI nondisplaced. Regular rate & rhythm. No rubs, gallops or murmurs. Lungs: clear Abdomen: soft, nontender, nondistended. No hepatosplenomegaly. No bruits or masses. Good bowel sounds. Extremities: no cyanosis, clubbing, rash, edema Neuro: alert & orientedx3, cranial  nerves grossly intact. moves all 4 extremities w/o difficulty. Affect pleasant  Telemetry: SR-ST 90-100s  Labs: Basic Metabolic Panel:  Recent Labs Lab 06/04/14 0010 06/04/14 0845 06/05/14 0510  NA 136*  --  133*  K 2.8*  --  3.2*  CL 90*  --  88*  CO2 28  --  28  GLUCOSE 118*  --  127*  BUN 40*  --  46*  CREATININE 1.89*  --  2.26*  CALCIUM 9.2  --  9.0  MG  --  1.8  --     Liver Function Tests: No results found for this basename: AST, ALT, ALKPHOS, BILITOT, PROT, ALBUMIN,  in the last 168 hours No results found for this basename: LIPASE, AMYLASE,  in the last 168 hours No results found for this basename: AMMONIA,  in the last 168 hours  CBC:  Recent Labs Lab 06/04/14 0010 06/05/14 0510  WBC 4.2 5.4  NEUTROABS 3.1  --   HGB 8.6* 8.5*  HCT 27.0* 26.6*  MCV 75.2* 74.3*  PLT 346 351    Cardiac Enzymes:  Recent Labs Lab 06/04/14 0010 06/04/14 0845 06/04/14 1430 06/04/14 2338  TROPONINI <0.30 <0.30 <0.30 <0.30    BNP: BNP (last 3 results)  Recent Labs  05/15/14 1200 05/23/14 0600 06/04/14 0010  PROBNP 2056.0* 1770.0* 1808.0*     Other results:  EKG:   Imaging: Nm Pulmonary Perfusion  06/04/2014   CLINICAL DATA:  Elevated D-dimer.  High risk for pulmonary embolus.  EXAM: NUCLEAR MEDICINE VENTILATION - PERFUSION LUNG SCAN  TECHNIQUE: Ventilation images could not be obtained due to equipment failure. Perfusion images were obtained in multiple projections after intravenous injection of Tc-4040m MAA.  RADIOPHARMACEUTICALS:  6.0 MCi Tc-3340m MAA  COMPARISON:  Chest x-ray 06/04/2014.  FINDINGS: Perfusion: Tiny subsegmental defects cannot be excluded. These findings most likely related to the patient's congestive heart failure and pulmonary edema noted on today's chest x-ray .  IMPRESSION: Suboptimal study as described above. This scan is indeterminate for pulmonary embolus. The findings on Perfusion scan are most likely related to congestive heart failure/  pulmonary edema noted on today's chest x-ray.   Electronically Signed   By: Maisie Fushomas  Register   On: 06/04/2014 13:36   Dg Chest Port 1 View  06/04/2014   CLINICAL DATA:  Chest, abdominal and bilateral leg pain. Shortness of breath.  EXAM: PORTABLE CHEST - 1 VIEW  COMPARISON:  Chest radiograph performed 05/23/2014  FINDINGS: The lungs are well-aerated. Vascular congestion is noted. Mildly increased interstitial markings could reflect minimal interstitial edema. No pleural effusion or pneumothorax is seen.  The cardiomediastinal silhouette is borderline enlarged. No acute osseous abnormalities are seen. A right PICC is noted ending about the proximal to mid SVC.  IMPRESSION: Vascular congestion and borderline cardiomegaly. Mildly increased interstitial markings could reflect minimal interstitial edema.   Electronically Signed   By: Roanna RaiderJeffery  Chang M.D.   On: 06/04/2014 01:28      Medications:     Scheduled Medications: . amiodarone  200 mg Oral BID  . aspirin EC  325 mg Oral Daily  . atorvastatin  80 mg Oral Daily  . bumetanide  1 mg Intravenous 3 times per day  . carvedilol  3.125 mg Oral BID WC  . digoxin  0.0625 mg Oral Daily  . enoxaparin (LOVENOX) injection  150 mg Subcutaneous Q12H  . hydrALAZINE  25 mg Oral 3 times per day  . insulin aspart  0-9 Units Subcutaneous 6 times per day  . insulin glargine  25 Units Subcutaneous QHS  . isosorbide mononitrate  60 mg Oral Daily  . loratadine  10 mg Oral Daily  . magnesium oxide  400 mg Oral BID  . pantoprazole  40 mg Oral Daily  . potassium chloride  20 mEq Oral BID  . sodium chloride  10-40 mL Intracatheter Q12H  . sodium chloride  3 mL Intravenous Q12H     Infusions: . milrinone 0.375 mcg/kg/min (06/05/14 0800)     PRN Medications:  sodium chloride, albuterol, oxyCODONE-acetaminophen, perflutren lipid microspheres (DEFINITY) IV suspension, sodium chloride, sodium chloride   Assessment:  1) A/C Systolic HF  2) NICM  - EF  30-35% (03/2014)  3) CP  4) HTN  5) DM2  6) OSA  7) CKD, stage III  8) Hypokalemia  9) Anemia   Plan/Discussion:    Volume status mildly elevated but really hard to assess.  Will get CVP and CO-OX now. CVP is 25.  Continue milrinone 0.375 mcg. Creatinine trending up. Stop IV bumex and give 80 mg IV lasix now and start lasix drip at 15 mg per hour. Lasix drip has worked well in the past.  Stop carvedilol. Continue  hydralazine/imdur.   VQ scan indeterminate for PE. Per primary lower extremity dopplers pending.    TSH ok. Rate controlled. Continue amio 200 mg bid. On aspirin  325 mg daily.    Length of Stay: 2   CLEGG,AMY NP-C  06/05/2014, 10:53 AM  Advanced Heart Failure Team Pager [phone] (M-F; 7a - 4p)  Please contact [facility] for night-coverage after hours (4p -7a ) and weekends on [email or web address]  Patient seen and examined with Tonye Becket, NP. We discussed all aspects of the encounter. I agree with the assessment and plan as stated above.   Volume status markedly elevated. Will switch to lasix drip. PE very unlikely. Continue milrinone. Check co-ox.  Ivery Michalski,MD 2:15 PM

## 2014-06-05 NOTE — Care Management Note (Addendum)
    Page 1 of 2   06/13/2014     12:20:36 PM CARE MANAGEMENT NOTE 06/13/2014  Patient:  Steven Wyatt, Steven Wyatt   Account Number:  [id number]  Date Initiated:  06/05/2014  Documentation initiated by:  Donn Pierini  Subjective/Objective Assessment:   Pt admitted with SOB, increased weight gain - acute on chronic CHF, vascular congestion     Action/Plan:   PTA pt lived at home- active with [facility] for HH-RN and IV Milrinone gtt, also has [employer], followed by the outpt HF clinic   Anticipated DC Date:  06/09/2014   Anticipated DC Plan:  HOME W HOME HEALTH SERVICES      DC Planning Services  CM consult      [facility] Choice  Resumption Of Svcs/PTA H[place]ue Sime   Choice offered to / List presented to:  C-1 Patient        HH arranged  HH-1 RN  HH-10 DISEASE MANAGEMENT  HH-6 SOCIAL WORKER  HH-2 PT      [facility] agency  [facility]   Status of service:  Completed, signed off Medicare Important Message given?  YES (If response is "NO", the following Medicare IM given date fields will be blank) Date Medicare IM given:  06/12/2014 Date Additional Medicare IM given:    Discharge Disposition:  HOME W HOME HEALTH SERVICES  Per UR Regulation:  Reviewed for med. necessity/level of care/duration of stay  If discussed at Long Length of Stay Meetings, dates discussed:   06/08/2014  06/13/2014    Comments:   06-13-14 [address], [place] [phone] CM did call [facility] to make them aware that pt will be d/c today. Pam Liaison for [facility] will come by pt's room and connect pt before d/c for milrinone gtt. Referral made and SOC to begin today. No further needs from CM at this time.   06-12-14 [address], [place] [phone] Pt was previously at home with Milrinone gtt. He is acitve with [facility] RN for services. Pt has bipap at home also. Pt will need resumption orders for [facility] and SW for additional resources in the community. Per pt he wants a motorized scooter. [facility] SW may be  able to assist with that. Pt continues on milrinone, lasix and dopamine gtt.  CM will continue to monitor.

## 2014-06-05 NOTE — Progress Notes (Signed)
Utilization review completed.  

## 2014-06-05 NOTE — Progress Notes (Addendum)
Called RT to set up CVP monitoring.   1215 RT initiated CVP. Will continue to monitor.

## 2014-06-05 NOTE — Progress Notes (Addendum)
[facility] PROGRESS NOTE Interim History: 38 y.o.male with past medical history outlined below, admitted with fatigue, shortness of breath associated with generalized chest discomfort and fullness, weights over the last 3 or 4 days has been fluctuating, Weight reported 358 lbs in CHF clinic May 26 2014, recorded at 361 lbs on admission 6.14.2015. When he was last seen on June 5, Bumex was increased to 5 mg twice daily. Patient states that his lowest weight home since that time was 350 pounds.   Filed Weights   06/03/14 2341 06/04/14 0654 06/05/14 0313  Weight: 162.388 kg (358 lb) 163.8 kg (361 lb 1.8 oz) 163.8 kg (361 lb 1.8 oz)        Intake/Output Summary (Last 24 hours) at 06/05/14 0742 Last data filed at 06/05/14 0600  Gross per 24 hour  Intake   1576 ml  Output   1775 ml  Net   -199 ml     Assessment/Plan:   NICM (nonischemic cardiomyopathy)/Acute on chronic systolic and diastolic heart failure, NYHA class 4: - echo onn 4.9.2015 had poor images. Repeating 2-d echo. - he is on bumex IV [facility] heart failure team has been consult and maneging diuretics. - monitor electrolytes, replete. - Hard to address fluid balance due to body habitus. - Weight is unchanged despite being negative and pt's relates breathing is improved.  CKD (chronic kidney disease), stage III - mild increase in Cr. - baseleine Cr ~2.2.  Microcytic anemia: - baseline around 9.0, 6.15.2015 8.5. - start ferrous sulfate. Check anemia panel. - RDW is high check b12 and RBC folate could have a mixed component. - denies melanotic stools or BRBPR. - check FOBT.  Elevated d-dimer - V/q scan undetermined. - doppler lower ext.  Diabetes mellitus - good controlled cont current regimen.  Hypokalemia - Replete, recheck in am. Pt's is on aldactone. - replete mag and monitor.  HTN (hypertension) - good controlled on current regimen.  Sleep apnea- he reports comliance with Bi Pap - cont  bipap.   Morbid obesity with BMI of 50.0-59.9, adult   Code Status: FULL CODE  Family Communication: Wife at Bedside  Disposition Plan: Inpatient to [facility] Bed    Consultants:  cardiolog [facility] failure team  Procedures: ECHO: pending V/q scan 4.15.2015.  Antibiotics:  None  HPI/Subjective: Relates SOB is improved. No further chest pain.  Objective: Filed Vitals:   06/05/14 0010 06/05/14 0313 06/05/14 0314 06/05/14 0519  BP: 108/64  108/56 114/81  Pulse: 84  85   Temp: 97.6 F (36.4 C)  98.3 F (36.8 C)   TempSrc: Oral  Oral   Resp: 15  17   Height:      Weight:  163.8 kg (361 lb 1.8 oz)    SpO2: 95%  98%      Exam:  General: Alert, awake, oriented x3, in no acute distress. morbidly obese HEENT: No bruits, no goiter.  Heart: Regular rate and rhythm, without murmurs, rubs, gallops.  Lungs: Good air movement, clear distant. Abdomen: Soft, nontender, nondistended, positive bowel sounds.     Data Reviewed: Basic Metabolic Panel:  Recent Labs Lab 06/04/14 0010 06/04/14 0845 06/05/14 0510  NA 136*  --  133*  K 2.8*  --  3.2*  CL 90*  --  88*  CO2 28  --  28  GLUCOSE 118*  --  127*  BUN 40*  --  46*  CREATININE 1.89*  --  2.26*  CALCIUM 9.2  --  9.0  MG  --  1.8  --    Liver Function Tests: No results found for this basename: AST, ALT, ALKPHOS, BILITOT, PROT, ALBUMIN,  in the last 168 hours No results found for this basename: LIPASE, AMYLASE,  in the last 168 hours No results found for this basename: AMMONIA,  in the last 168 hours CBC:  Recent Labs Lab 06/04/14 0010 06/05/14 0510  WBC 4.2 5.4  NEUTROABS 3.1  --   HGB 8.6* 8.5*  HCT 27.0* 26.6*  MCV 75.2* 74.3*  PLT 346 351   Cardiac Enzymes:  Recent Labs Lab 06/04/14 0010 06/04/14 0845 06/04/14 1430 06/04/14 2338  TROPONINI <0.30 <0.30 <0.30 <0.30   BNP (last 3 results)  Recent Labs  05/15/14 1200 05/23/14 0600 06/04/14 0010  PROBNP 2056.0* 1770.0* 1808.0*    CBG:  Recent Labs Lab 06/04/14 1133 06/04/14 1736 06/04/14 2129 06/05/14 0009 06/05/14 0310  GLUCAP 138* 187* 166* 170* 173*    Recent Results (from the past 240 hour(s))  MRSA PCR SCREENING     Status: None   Collection Time    06/04/14  6:59 AM      Result Value Ref Range Status   MRSA by PCR NEGATIVE  NEGATIVE Final   Comment:            The GeneXpert MRSA Assay (FDA     approved for NASAL specimens     only), is one component of a     comprehensive MRSA colonization     surveillance program. It is not     intended to diagnose MRSA     infection nor to guide or     monitor treatment for     MRSA infections.     Studies: Nm Pulmonary Perfusion  06/04/2014   CLINICAL DATA:  Elevated D-dimer.  High risk for pulmonary embolus.  EXAM: NUCLEAR MEDICINE VENTILATION - PERFUSION LUNG SCAN  TECHNIQUE: Ventilation images could not be obtained due to equipment failure. Perfusion images were obtained in multiple projections after intravenous injection of Tc-6018m MAA.  RADIOPHARMACEUTICALS:  6.0 MCi Tc-8418m MAA  COMPARISON:  Chest x-ray 06/04/2014.  FINDINGS: Perfusion: Tiny subsegmental defects cannot be excluded. These findings most likely related to the patient's congestive heart failure and pulmonary edema noted on today's chest x-ray .  IMPRESSION: Suboptimal study as described above. This scan is indeterminate for pulmonary embolus. The findings on Perfusion scan are most likely related to congestive heart failure/ pulmonary edema noted on today's chest x-ray.   Electronically Signed   By: Maisie Fushomas  Register   On: 06/04/2014 13:36   Dg Chest Port 1 View  06/04/2014   CLINICAL DATA:  Chest, abdominal and bilateral leg pain. Shortness of breath.  EXAM: PORTABLE CHEST - 1 VIEW  COMPARISON:  Chest radiograph performed 05/23/2014  FINDINGS: The lungs are well-aerated. Vascular congestion is noted. Mildly increased interstitial markings could reflect minimal interstitial edema. No pleural  effusion or pneumothorax is seen.  The cardiomediastinal silhouette is borderline enlarged. No acute osseous abnormalities are seen. A right PICC is noted ending about the proximal to mid SVC.  IMPRESSION: Vascular congestion and borderline cardiomegaly. Mildly increased interstitial markings could reflect minimal interstitial edema.   Electronically Signed   By: Roanna RaiderJeffery  Chang M.D.   On: 06/04/2014 01:28    Scheduled Meds: . amiodarone  200 mg Oral BID  . aspirin EC  325 mg Oral Daily  . atorvastatin  80 mg Oral Daily  . bumetanide  1 mg Intravenous 3 times per day  .  carvedilol  3.125 mg Oral BID WC  . digoxin  0.0625 mg Oral Daily  . enoxaparin (LOVENOX) injection  150 mg Subcutaneous Q12H  . hydrALAZINE  25 mg Oral 3 times per day  . insulin aspart  0-9 Units Subcutaneous 6 times per day  . insulin glargine  25 Units Subcutaneous QHS  . isosorbide mononitrate  60 mg Oral Daily  . loratadine  10 mg Oral Daily  . magnesium oxide  400 mg Oral BID  . pantoprazole  40 mg Oral Daily  . potassium chloride SA  60 mEq Oral BID  . sodium chloride  10-40 mL Intracatheter Q12H  . sodium chloride  3 mL Intravenous Q12H   Continuous Infusions: . milrinone 0.375 mcg/kg/min (06/05/14 0724)     Marinda Elk  [facility] Pager [phone] If 8PM-8AM, please contact night-coverage at [email or web address], password [facility] 06/05/2014, 7:42 AM  LOS: 2 days     **Disclaimer: This note may have been dictated with voice recognition software. Similar sounding words can inadvertently be transcribed and this note may contain transcription errors which may not have been corrected upon publication of note.**

## 2014-06-05 NOTE — Progress Notes (Signed)
Patient wearing Bi pap at this time. Will continue to monitor.

## 2014-06-05 NOTE — Progress Notes (Signed)
*  PRELIMINARY RESULTS* Vascular Ultrasound Lower extremity venous duplex has been completed.  Preliminary findings: Technically limited due to body habitus. Not all veins visualized. No DVT noted in visualized veins bilaterally. Venous flow is pulsatile, suggestive of increased right sided heart pressure.   Farrel Demark, RDMS, RVT  06/05/2014, 4:33 PM

## 2014-06-05 NOTE — Progress Notes (Signed)
Echocardiogram 2D Echocardiogram with Definity has been performed.  Steven Wyatt 06/05/2014, 10:40 AM

## 2014-06-05 NOTE — Progress Notes (Signed)
[facility]  Patient Status:   Active pt with [facility] prior to this readmission   [facility] is providing the following services: [facility], [facility] and [facility] for home Milrinone. [facility] hospital team will follow and support DC home when deemed appropriate by MD/HF team.   If patient discharges after hours, please call [phone].   [name] 06/05/2014, 12:39 PM

## 2014-06-05 NOTE — Progress Notes (Signed)
Physician notified: Tonye Becket At: 1519  Regarding: Lasix gtt started. Pt refused 80mg  IVP bolus, "sits in his stomach, makes nauseous, throws up clear liquid".  Awaiting return response.   Returned Response at: 1520  Order(s): Acknowledged, no new orders.

## 2014-06-06 ENCOUNTER — Encounter: Payer: Self-pay | Admitting: Internal Medicine

## 2014-06-06 DIAGNOSIS — I5022 Chronic systolic (congestive) heart failure: Secondary | ICD-10-CM

## 2014-06-06 DIAGNOSIS — R791 Abnormal coagulation profile: Secondary | ICD-10-CM

## 2014-06-06 DIAGNOSIS — N189 Chronic kidney disease, unspecified: Secondary | ICD-10-CM

## 2014-06-06 DIAGNOSIS — N179 Acute kidney failure, unspecified: Secondary | ICD-10-CM

## 2014-06-06 DIAGNOSIS — D649 Anemia, unspecified: Secondary | ICD-10-CM

## 2014-06-06 LAB — CARBOXYHEMOGLOBIN
Carboxyhemoglobin: 2.1 % — ABNORMAL HIGH (ref 0.5–1.5)
Methemoglobin: 0.8 % (ref 0.0–1.5)
O2 Saturation: 65.3 %
TOTAL HEMOGLOBIN: 8.7 g/dL — AB (ref 13.5–18.0)

## 2014-06-06 LAB — GLUCOSE, CAPILLARY
GLUCOSE-CAPILLARY: 160 mg/dL — AB (ref 70–99)
Glucose-Capillary: 208 mg/dL — ABNORMAL HIGH (ref 70–99)
Glucose-Capillary: 190 mg/dL — ABNORMAL HIGH (ref 70–99)
Glucose-Capillary: 124 mg/dL — ABNORMAL HIGH (ref 70–99)

## 2014-06-06 LAB — BASIC METABOLIC PANEL
BUN: 50 mg/dL — ABNORMAL HIGH (ref 6–23)
CALCIUM: 8.7 mg/dL (ref 8.4–10.5)
CO2: 27 meq/L (ref 19–32)
Chloride: 87 mEq/L — ABNORMAL LOW (ref 96–112)
Creatinine, Ser: 2.39 mg/dL — ABNORMAL HIGH (ref 0.50–1.35)
GFR calc non Af Amer: 33 mL/min — ABNORMAL LOW (ref >90)
GFR, EST AFRICAN AMERICAN: 39 mL/min — AB (ref >90)
Glucose, Bld: 222 mg/dL — ABNORMAL HIGH (ref 70–99)
Potassium: 3 mEq/L — ABNORMAL LOW (ref 3.7–5.3)
Sodium: 131 mEq/L — ABNORMAL LOW (ref 137–147)

## 2014-06-06 MED ORDER — METOLAZONE 5 MG PO TABS
5.0000 mg | ORAL_TABLET | Freq: Once | ORAL | Status: AC
  Administered 2014-06-06: 5 mg via ORAL
  Filled 2014-06-06: qty 1

## 2014-06-06 MED ORDER — METOLAZONE 10 MG PO TABS: 10.0000 mg | ORAL_TABLET | Freq: Every day | ORAL | Status: DC

## 2014-06-06 MED ORDER — POTASSIUM CHLORIDE CRYS ER 20 MEQ PO TBCR
60.0000 meq | EXTENDED_RELEASE_TABLET | Freq: Once | ORAL | Status: AC
  Administered 2014-06-06: 60 meq via ORAL
  Filled 2014-06-06: qty 3

## 2014-06-06 MED ORDER — POTASSIUM CHLORIDE CRYS ER 20 MEQ PO TBCR
40.0000 meq | EXTENDED_RELEASE_TABLET | Freq: Two times a day (BID) | ORAL | Status: AC
  Administered 2014-06-06 (×2): 40 meq via ORAL
  Filled 2014-06-06 (×2): qty 2

## 2014-06-06 MED ORDER — FUROSEMIDE 10 MG/ML IJ SOLN
40.0000 mg | Freq: Once | INTRAMUSCULAR | Status: AC
  Administered 2014-06-06: 40 mg via INTRAVENOUS

## 2014-06-06 MED ORDER — INSULIN GLARGINE 100 UNIT/ML ~~LOC~~ SOLN
20.0000 [IU] | Freq: Two times a day (BID) | SUBCUTANEOUS | Status: DC
  Filled 2014-06-06 (×2): qty 0.2

## 2014-06-06 MED ORDER — DOPAMINE-DEXTROSE 3.2-5 MG/ML-% IV SOLN
INTRAVENOUS | Status: AC
  Administered 2014-06-06: 3 ug/kg/min via INTRAVENOUS
  Filled 2014-06-06: qty 250

## 2014-06-06 MED ORDER — FUROSEMIDE 10 MG/ML IJ SOLN
INTRAMUSCULAR | Status: AC
  Filled 2014-06-06: qty 4

## 2014-06-06 MED ORDER — DOPAMINE-DEXTROSE 3.2-5 MG/ML-% IV SOLN
3.0000 ug/kg/min | INTRAVENOUS | Status: DC
  Administered 2014-06-06 – 2014-06-12 (×5): 3 ug/kg/min via INTRAVENOUS
  Filled 2014-06-06 (×5): qty 250

## 2014-06-06 MED ORDER — INSULIN GLARGINE 100 UNIT/ML ~~LOC~~ SOLN
15.0000 [IU] | Freq: Two times a day (BID) | SUBCUTANEOUS | Status: DC
  Administered 2014-06-06 – 2014-06-13 (×15): 15 [IU] via SUBCUTANEOUS
  Filled 2014-06-06 (×17): qty 0.15

## 2014-06-06 MED ORDER — MORPHINE SULFATE 2 MG/ML IJ SOLN
2.0000 mg | Freq: Once | INTRAMUSCULAR | Status: AC
  Administered 2014-06-06: 2 mg via INTRAVENOUS
  Filled 2014-06-06: qty 1

## 2014-06-06 MED ORDER — METOLAZONE 10 MG PO TABS
10.0000 mg | ORAL_TABLET | Freq: Every day | ORAL | Status: AC
  Administered 2014-06-06: 10 mg via ORAL
  Filled 2014-06-06: qty 1

## 2014-06-06 NOTE — Progress Notes (Signed)
Dr. Hiram Comber notified with cardiology of patient's chest pain with relief from Morphine IV. CVP increased. New orders received. Will continue to monitor.

## 2014-06-06 NOTE — Progress Notes (Signed)
Patient having midsternal chest pain 7/10 "squeezing different from usual pain" with bilateral sharp, intermittent flank pain 7/10. Percocet 2 tabs given without relief. Merdis Delay, NP notified. New orders received. Refer to cardiology. EKG done. Cardiology MD on call paged.

## 2014-06-06 NOTE — Progress Notes (Signed)
Physician notified: Robb Matar At: [id number]  Regarding: pt c/o nausea, no antiemetics ordered.   No return response. Pt resting quietly at present time.

## 2014-06-06 NOTE — Progress Notes (Signed)
Pt is able to place Bipap on and off himself as needed. Pt encouraged to call if needing any assistance with mask placement or machine. No distress noted.

## 2014-06-06 NOTE — Telephone Encounter (Signed)
Have called this patient several days and he has not returned my call.

## 2014-06-06 NOTE — Progress Notes (Signed)
[facility] PROGRESS NOTE Interim History: 38 y.o.male with past medical history outlined below, admitted with fatigue, shortness of breath associated with generalized chest discomfort and fullness, weights over the last 3 or 4 days has been fluctuating, Weight reported 358 lbs in CHF clinic May 26 2014, recorded at 361 lbs on admission 6.14.2015. When he was last seen on June 5, Bumex was increased to 5 mg twice daily. Patient states that his lowest weight home since that time was 350 pounds.   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 - On lasix ggt and milrinone. - monitor electrolytes, replete. - Hard to address fluid balance due to body habitus. - Weight is is up, he did have almost 2.0L despite being fluid restricted  CKD (chronic kidney disease), stage III - mild increase in Cr. - baseleine Cr ~2.2.  Microcytic anemia: - baseline around 9.0, 6.15.2015 8.5. - started ferrous sulfate. Iron 20, ferritin 55, B12 700, folate > 9.0. - check FOBT.  Elevated d-dimer - V/q scan undetermined. - doppler lower ext.  Diabetes mellitus - uncontrolled, change lantis to BID. - check CBG's ACHS.  Hypokalemia - Replete, recheck in am. Pt's is on aldactone. - replete mag and monitor.  HTN (hypertension) - good controlled on current regimen.  Sleep apnea- he reports comliance with Bi Pap - cont bipap.   Morbid obesity with BMI of 50.0-59.9, adult   Code Status: FULL CODE  Family Communication: Wife at Bedside  Disposition Plan: Inpatient to [facility]  Bed    Consultants:  cardiolog [facility] heart failure team  Procedures: ECHO 6.15.2015: estimated ejection fraction was 15%. Diffuse hypokinesis. grade 2 diastolic dysfunction- Left atrium: The atrium was moderately dilated.- Right ventricle: The cavity size was moderately dilated. Systolic function was moderately reduced. Right atrium: The atrium was moderately dilated. Pulmonary arteries: PA peak pressure: 43 mm Hg (S).  V/q scan 4.15.2015: in determine. doppler DVT 6.15.2015: negative.  Antibiotics:  None  HPI/Subjective: Relates SOB is improved. No further chest pain.  Objective: Filed Vitals:   06/06/14 0151 06/06/14 0310 06/06/14 0517 06/06/14 0635  BP:  104/78  104/80  Pulse: 76 81    Temp:  96.8 F (36 C)    TempSrc:  Axillary    Resp:  18    Height:   5\' 8"  (1.727 m)   Weight:   164.1 kg (361 lb 12.4 oz)   SpO2: 100% 96%       Exam:  General: in no acute distress. morbidly obese HEENT: No bruits, no goiter.  Heart: Regular rate and rhythm,  Lungs: Good air movement, clear distant. Abdomen: Soft, nontender, nondistended, positive bowel sounds.     Data Reviewed: Basic Metabolic Panel:  Recent Labs Lab 06/04/14 0010 06/04/14 0845 06/05/14 0510 06/06/14 0335  NA 136*  --  133* 131*  K 2.8*  --  3.2* 3.0*  CL 90*  --  88* 87*  CO2 28  --  28 27  GLUCOSE 118*  --  127* 222*  BUN 40*  --  46* 50*  CREATININE 1.89*  --  2.26* 2.39*  CALCIUM 9.2  --  9.0 8.7  MG  --  1.8  --   --    Liver Function Tests: No results found for this basename: AST, ALT, ALKPHOS, BILITOT, PROT, ALBUMIN,  in the last 168 hours No results found for this basename: LIPASE, AMYLASE,  in the last 168 hours No results found for this basename: AMMONIA,  in the last 168 hours CBC:  Recent Labs Lab 06/19/14 0010 06/05/14 0510  WBC 4.2 5.4  NEUTROABS 3.1  --   HGB 8.6* 8.5*  HCT 27.0* 26.6*  MCV 75.2* 74.3*  PLT 346 351   Cardiac Enzymes:  Recent Labs Lab  06-19-2014 0010 06/19/14 0845 19-Jun-2014 1430 06/19/14 2338  TROPONINI <0.30 <0.30 <0.30 <0.30   BNP (last 3 results)  Recent Labs  05/15/14 1200 05/23/14 0600 19-Jun-2014 0010  PROBNP 2056.0* 1770.0* 1808.0*   CBG:  Recent Labs Lab 06/05/14 1231 06/05/14 1727 06/05/14 1932 06/05/14 2315 06/06/14 0312  GLUCAP 207* 146* 152* 208* 190*    Recent Results (from the past 240 hour(s))  MRSA PCR SCREENING     Status: None   Collection Time    Jun 19, 2014  6:59 AM      Result Value Ref Range Status   MRSA by PCR NEGATIVE  NEGATIVE Final   Comment:            The GeneXpert MRSA Assay (FDA     approved for NASAL specimens     only), is one component of a     comprehensive MRSA colonization     surveillance program. It is not     intended to diagnose MRSA     infection nor to guide or     monitor treatment for     MRSA infections.  URINE CULTURE     Status: None   Collection Time    2014-06-19  1:30 PM      Result Value Ref Range Status   Specimen Description URINE, CLEAN CATCH   Final   Special Requests NONE   Final   Culture  Setup Time     Final   Value: 06/19/2014 21:36     Performed at [facility] Count     Final   Value: 60,000 COLONIES/ML     Performed at [facility]   Culture     Final   Value: Multiple bacterial morphotypes present, none predominant. Suggest appropriate recollection if clinically indicated.     Performed at [facility]   Report Status 06/05/2014 FINAL   Final     Studies: Nm Pulmonary Perfusion  June 19, 2014   CLINICAL DATA:  Elevated D-dimer.  High risk for pulmonary embolus.  EXAM: NUCLEAR MEDICINE VENTILATION - PERFUSION LUNG SCAN  TECHNIQUE: Ventilation images could not be obtained due to equipment failure. Perfusion images were obtained in multiple projections after intravenous injection of Tc-83m MAA.  RADIOPHARMACEUTICALS:  6.0 MCi Tc-40m MAA  COMPARISON:  Chest x-ray 2014/06/19.  FINDINGS: Perfusion: Tiny  subsegmental defects cannot be excluded. These findings most likely related to the patient's congestive heart failure and pulmonary edema noted on today's chest x-ray .  IMPRESSION: Suboptimal study as described above. This scan is indeterminate for pulmonary embolus. The findings on Perfusion scan are most likely related to congestive heart failure/ pulmonary edema noted on today's chest x-ray.   Electronically Signed  ByMaisie Fus: Thomas  Register   On: 06/04/2014 13:36    Scheduled Meds: . amiodarone  200 mg Oral BID  . aspirin EC  325 mg Oral Daily  . atorvastatin  80 mg Oral Daily  . carvedilol  3.125 mg Oral BID WC  . digoxin  0.0625 mg Oral Daily  . furosemide  80 mg Intravenous Once  . heparin subcutaneous  5,000 Units Subcutaneous 3 times per day  . hydrALAZINE  25 mg Oral 3 times per day  . insulin aspart  0-9 Units Subcutaneous 6 times per day  . insulin glargine  25 Units Subcutaneous QHS  . isosorbide mononitrate  60 mg Oral Daily  . loratadine  10 mg Oral Daily  . magnesium oxide  400 mg Oral BID  . pantoprazole  40 mg Oral Daily  . sodium chloride  10-40 mL Intracatheter Q12H  . sodium chloride  3 mL Intravenous Q12H   Continuous Infusions: . furosemide (LASIX) infusion 30 mg/hr (06/06/14 0600)  . milrinone 0.375 mcg/kg/min (06/06/14 0600)     Marinda ElkFELIZ ORTIZ, ABRAHAM  [facility] Pager [phone] If 8PM-8AM, please contact night-coverage at [email or web address], password [email or web address] 06/06/2014, 7:42 AM  LOS: 3 days     **Disclaimer: This note may have been dictated with voice recognition software. Similar sounding words can inadvertently be transcribed and this note may contain transcription errors which may not have been corrected upon publication of note.**

## 2014-06-06 NOTE — Progress Notes (Signed)
Physician notified: Tonye Becket, NP At: 1126   Regarding: Clarification: received K+ this AM, want 60 total or 60 additional dose? Awaiting RT to connect CVP, do not have T-connector.  Awaiting return response.   Returned Response at: 1127  Order(s): 60 mEq extra.

## 2014-06-06 NOTE — Telephone Encounter (Signed)
Called the patient left a message to call back. 

## 2014-06-06 NOTE — Significant Event (Signed)
Called by the nurse who reported that the patient complained of chest pain. She also reported that despite being on Lasix drip at 15 mg per hour patient didn't put out significant amount of urine. He is probably neck -100 cc since the beginning of the sheath at 7 AM. His CVP continued to climb and now around 30 mm mercury. Given the above findings patient received 10 mg of metolazone by mouth times one and his Lasix was increased to 30 mg per hour. Loading dose of 40 mg was added IV

## 2014-06-06 NOTE — Progress Notes (Signed)
Advanced Heart Failure Rounding Note   Subjective:    38 yr old AAM with a history of NICM (nor cors Oct 2014), chronic systolic HF ECHO 1/61091/2015 EF 20% on chronic home milrinone via PICC, morbid obesity, OSA on BIPAP, HTN, CKD uncontrolled DM, and HLD. He has been followed closely in HF clinic chronic home Milrinone 0.375 mcg , recent d/c from hospital on 05/18/2014 .   Prior to admit he says he did not take bumex on Saturday because he had flank pain.   [facility] with increased dyspnea and leg pain. Pro BNP 1808 and CEs negative. VQ indeterminate for PE.    Yesterday IV bumex stopped and he was started on lasix drip at 15 mg per hour with metolazone.  Due to sluggish UOP lasix drip was increased to 30 mg per hour with 40 mg IV bolus.Still with sluggish UOP. Weight up 3 pounds.     Denies SOB. Says he feels better.     Objective:   Weight Range:  Vital Signs:   Temp:  [96.3 F (35.7 C)-97.7 F (36.5 C)] 97.4 F (36.3 C) (06/16 0700) Pulse Rate:  [76-86] 86 (06/16 0812) Resp:  [14-25] 14 (06/16 0812) BP: (102-133)/(69-97) 133/97 mmHg (06/16 0812) SpO2:  [95 %-100 %] 100 % (06/16 0812) Weight:  [361 lb 12.4 oz (164.1 kg)-363 lb 1.6 oz (164.7 kg)] 363 lb 1.6 oz (164.7 kg) (06/16 0952) Last BM Date: 06/03/14  Weight change: Filed Weights   06/05/14 0900 06/06/14 0517 06/06/14 0952  Weight: 360 lb 3.7 oz (163.4 kg) 361 lb 12.4 oz (164.1 kg) 363 lb 1.6 oz (164.7 kg)    Intake/Output:   Intake/Output Summary (Last 24 hours) at 06/06/14 1040 Last data filed at 06/06/14 0914  Gross per 24 hour  Intake 1882.29 ml  Output   2250 ml  Net -367.71 ml     Physical Exam: CVP not hooked up at request of patient.  General:  Well appearing. No resp difficulty. Sitting on the side of the bed.  Wife present.  HEENT: normal Neck: supple. JVP difficult to assess due to body habitus. Carotids 2+ bilat; no bruits. No lymphadenopathy or thryomegaly appreciated. Cor: PMI nondisplaced.  Regular rate & rhythm. No rubs, gallops or murmurs. Lungs: clear Abdomen: obese, soft, nontender, distended. No hepatosplenomegaly. No bruits or masses. Good bowel sounds. Extremities: no cyanosis, clubbing, rash, R and LLE 2+ edema Neuro: alert & orientedx3, cranial nerves grossly intact. moves all 4 extremities w/o difficulty. Affect pleasant  Telemetry: SR-ST 90-100s  Labs: Basic Metabolic Panel:  Recent Labs Lab 06/04/14 0010 06/04/14 0845 06/05/14 0510 06/06/14 0335  NA 136*  --  133* 131*  K 2.8*  --  3.2* 3.0*  CL 90*  --  88* 87*  CO2 28  --  28 27  GLUCOSE 118*  --  127* 222*  BUN 40*  --  46* 50*  CREATININE 1.89*  --  2.26* 2.39*  CALCIUM 9.2  --  9.0 8.7  MG  --  1.8  --   --     Liver Function Tests: No results found for this basename: AST, ALT, ALKPHOS, BILITOT, PROT, ALBUMIN,  in the last 168 hours No results found for this basename: LIPASE, AMYLASE,  in the last 168 hours No results found for this basename: AMMONIA,  in the last 168 hours  CBC:  Recent Labs Lab 06/04/14 0010 06/05/14 0510  WBC 4.2 5.4  NEUTROABS 3.1  --   HGB  8.6* 8.5*  HCT 27.0* 26.6*  MCV 75.2* 74.3*  PLT 346 351    Cardiac Enzymes:  Recent Labs Lab 06/04/14 0010 06/04/14 0845 06/04/14 1430 06/04/14 2338  TROPONINI <0.30 <0.30 <0.30 <0.30    BNP: BNP (last 3 results)  Recent Labs  05/15/14 1200 05/23/14 0600 06/04/14 0010  PROBNP 2056.0* 1770.0* 1808.0*     Other results:    Imaging: Nm Pulmonary Perfusion  06/04/2014   CLINICAL DATA:  Elevated D-dimer.  High risk for pulmonary embolus.  EXAM: NUCLEAR MEDICINE VENTILATION - PERFUSION LUNG SCAN  TECHNIQUE: Ventilation images could not be obtained due to equipment failure. Perfusion images were obtained in multiple projections after intravenous injection of Tc-45m MAA.  RADIOPHARMACEUTICALS:  6.0 MCi Tc-68m MAA  COMPARISON:  Chest x-ray 06/04/2014.  FINDINGS: Perfusion: Tiny subsegmental defects cannot be  excluded. These findings most likely related to the patient's congestive heart failure and pulmonary edema noted on today's chest x-ray .  IMPRESSION: Suboptimal study as described above. This scan is indeterminate for pulmonary embolus. The findings on Perfusion scan are most likely related to congestive heart failure/ pulmonary edema noted on today's chest x-ray.   Electronically Signed   By: Maisie Fus  Register   On: 06/04/2014 13:36     Medications:     Scheduled Medications: . amiodarone  200 mg Oral BID  . aspirin EC  325 mg Oral Daily  . atorvastatin  80 mg Oral Daily  . carvedilol  3.125 mg Oral BID WC  . digoxin  0.0625 mg Oral Daily  . heparin subcutaneous  5,000 Units Subcutaneous 3 times per day  . hydrALAZINE  25 mg Oral 3 times per day  . insulin aspart  0-9 Units Subcutaneous 6 times per day  . insulin glargine  15 Units Subcutaneous BID  . isosorbide mononitrate  60 mg Oral Daily  . loratadine  10 mg Oral Daily  . magnesium oxide  400 mg Oral BID  . pantoprazole  40 mg Oral Daily  . potassium chloride  40 mEq Oral BID  . potassium chloride  60 mEq Oral Once  . sodium chloride  10-40 mL Intracatheter Q12H  . sodium chloride  3 mL Intravenous Q12H    Infusions: . furosemide (LASIX) infusion 30 mg/hr (06/06/14 0900)  . milrinone 0.375 mcg/kg/min (06/06/14 0914)    PRN Medications: sodium chloride, albuterol, oxyCODONE-acetaminophen, sodium chloride, sodium chloride   Assessment:  1) A/C Systolic HF  2) NICM  - EF 30-35% (03/2014)  3) CP  4) HTN  5) DM2  6) OSA  7) CKD, stage III  8) Hypokalemia  9) Anemia   Plan/Discussion:    Volume status remains elevated. CVP remains elevated. Continue lasix drip at 30 mg per hour. CO-OX 63%. Continue milrinone 0.375 mcg. Creatinine trending up. Keep off BB. Continue  hydralazine/imdur. Supplement potassium.  Reinforced low salt diet and limiting fluid intake.   VQ scan indeterminate for PE. Per primary lower extremity  dopplers pending.   TSH ok. Rate controlled. Continue amio 200 mg bid. On aspirin 325 mg daily.    Length of Stay: 3   CLEGG,AMY NP-C  06/06/2014, 10:40 AM  Advanced Heart Failure Team Pager [phone] (M-F; 7a - 4p)  Please contact [facility] for night-coverage after hours (4p -7a ) and weekends on [email or web address]  Patient seen and examined with Tonye Becket, NP. We discussed all aspects of the encounter. I agree with the assessment and plan as stated above.   He is  feeling better but urine output remains sluggish. I check CVP personal and was 34! Given his sx and exam, I find this hard to believe but was zeroed and checked personally and wave form looked good. We discussed trial of UF versus adding renal dose dopamine. Will try dopamine and see how he does. If no response tomorrow will plan RHC.  Daniel Bensimhon,MD 5:30 PM

## 2014-06-06 NOTE — Progress Notes (Addendum)
Inpatient Diabetes Program Recommendations  AACE/ADA: New Consensus Statement on Inpatient Glycemic Control (2013)  Target Ranges:  Prepandial:   less than 140 mg/dL      Peak postprandial:   less than 180 mg/dL (1-2 hours)      Critically ill patients:  140 - 180 mg/dL     Results for Steven Wyatt, Steven Wyatt (MRN [id number]) as of 06/06/2014 09:39  Ref. Range 06/05/2014 00:09 06/05/2014 03:10 06/05/2014 07:45 06/05/2014 12:31 06/05/2014 17:27 06/05/2014 19:32  Glucose-Capillary Latest Range: 70-99 mg/dL 700 (H) 174 (H) 944 (H) 207 (H) 146 (H) 152 (H)    Results for Steven Wyatt, Steven Wyatt (MRN [id number]) as of 06/06/2014 09:39  Ref. Range 06/05/2014 23:15 06/06/2014 03:12  Glucose-Capillary Latest Range: 70-99 mg/dL 466 (H) 599 (H)    Results for Steven Wyatt, Steven Wyatt (MRN [id number]) as of 06/06/2014 09:39  Ref. Range 04/13/2014 04:45 06/05/2014 05:10  Hemoglobin A1C Latest Range: <5.7 % 13.7 (H) 7.6 (H)    Admitted with CP, SOB, Pain in LE.  History of DM2, CHF, HTN, CKD 3  Home DM Meds: Lantus 25 units QHS + Novolog SSI   **Note patient's A1c has markedly improved since April (13.7% down to present A1c of 7.6%).  **Note that MD heavily increased patient's Lantus dose today.  Note sure if patient will need such a large increase in his Lantus dose.   MD- Please consider the following:  1. Decrease Lantus back to Lantus 30 units QHS (patient received 25 units Lantus on the evening of 06/15- Fasting glucose only slightly elevated today and dose is now up to 20 units bid) 2. Increase Novolog SSI to Moderate scale tid ac + HS   Addendum 1000am- Spoke with Dr. David Stall.  Discussed patient's CBGs.  Dr. David Stall changed patient's Lantus order to 15 units bid.  Alerted RN to new order.  Will follow.   Will follow Ambrose Finland RN, MSN, CDE Diabetes Coordinator Inpatient Diabetes Program Team Pager: [phone] (8a-10p)

## 2014-06-07 ENCOUNTER — Encounter: Payer: Self-pay | Admitting: Internal Medicine

## 2014-06-07 DIAGNOSIS — I5033 Acute on chronic diastolic (congestive) heart failure: Secondary | ICD-10-CM

## 2014-06-07 LAB — BASIC METABOLIC PANEL
BUN: 51 mg/dL — ABNORMAL HIGH (ref 6–23)
BUN: 51 mg/dL — AB (ref 6–23)
CALCIUM: 9.3 mg/dL (ref 8.4–10.5)
CHLORIDE: 88 meq/L — AB (ref 96–112)
CO2: 31 meq/L (ref 19–32)
CO2: 31 mEq/L (ref 19–32)
CREATININE: 2.1 mg/dL — AB (ref 0.50–1.35)
Calcium: 9.6 mg/dL (ref 8.4–10.5)
Chloride: 85 mEq/L — ABNORMAL LOW (ref 96–112)
Creatinine, Ser: 2.16 mg/dL — ABNORMAL HIGH (ref 0.50–1.35)
GFR calc Af Amer: 45 mL/min — ABNORMAL LOW (ref >90)
GFR calc non Af Amer: 39 mL/min — ABNORMAL LOW (ref >90)
GFR, EST AFRICAN AMERICAN: 44 mL/min — AB (ref >90)
GFR, EST NON AFRICAN AMERICAN: 38 mL/min — AB (ref >90)
GLUCOSE: 151 mg/dL — AB (ref 70–99)
Glucose, Bld: 131 mg/dL — ABNORMAL HIGH (ref 70–99)
POTASSIUM: 2.9 meq/L — AB (ref 3.7–5.3)
Potassium: 2.7 mEq/L — CL (ref 3.7–5.3)
SODIUM: 133 meq/L — AB (ref 137–147)
Sodium: 135 mEq/L — ABNORMAL LOW (ref 137–147)

## 2014-06-07 LAB — GLUCOSE, CAPILLARY
GLUCOSE-CAPILLARY: 147 mg/dL — AB (ref 70–99)
GLUCOSE-CAPILLARY: 199 mg/dL — AB (ref 70–99)
GLUCOSE-CAPILLARY: 218 mg/dL — AB (ref 70–99)
Glucose-Capillary: 174 mg/dL — ABNORMAL HIGH (ref 70–99)
Glucose-Capillary: 162 mg/dL — ABNORMAL HIGH (ref 70–99)
Glucose-Capillary: 115 mg/dL — ABNORMAL HIGH (ref 70–99)
Glucose-Capillary: 158 mg/dL — ABNORMAL HIGH (ref 70–99)
Glucose-Capillary: 161 mg/dL — ABNORMAL HIGH (ref 70–99)

## 2014-06-07 LAB — CARBOXYHEMOGLOBIN
Carboxyhemoglobin: 2.7 % — ABNORMAL HIGH (ref 0.5–1.5)
Methemoglobin: 0.8 % (ref 0.0–1.5)
O2 SAT: 96.1 %
Total hemoglobin: 8.7 g/dL — ABNORMAL LOW (ref 13.5–18.0)

## 2014-06-07 MED ORDER — SPIRONOLACTONE 25 MG PO TABS
25.0000 mg | ORAL_TABLET | Freq: Every day | ORAL | Status: DC
  Administered 2014-06-07 – 2014-06-13 (×7): 25 mg via ORAL
  Filled 2014-06-07 (×7): qty 1

## 2014-06-07 MED ORDER — POTASSIUM CHLORIDE CRYS ER 20 MEQ PO TBCR
40.0000 meq | EXTENDED_RELEASE_TABLET | Freq: Two times a day (BID) | ORAL | Status: DC
Start: 2014-06-07 — End: 2014-06-08
  Administered 2014-06-07: 40 meq via ORAL
  Filled 2014-06-07 (×3): qty 2

## 2014-06-07 MED ORDER — POTASSIUM CHLORIDE CRYS ER 20 MEQ PO TBCR
60.0000 meq | EXTENDED_RELEASE_TABLET | Freq: Once | ORAL | Status: AC
  Filled 2014-06-07: qty 3

## 2014-06-07 MED ORDER — POTASSIUM CHLORIDE 10 MEQ/50ML IV SOLN: 10.0000 meq | INTRAVENOUS | Status: DC

## 2014-06-07 MED ORDER — ENOXAPARIN SODIUM 30 MG/0.3ML ~~LOC~~ SOLN
30.0000 mg | SUBCUTANEOUS | Status: DC
  Filled 2014-06-07 (×6): qty 0.3

## 2014-06-07 NOTE — Telephone Encounter (Signed)
Letter sent, pt to call for appt

## 2014-06-07 NOTE — Progress Notes (Signed)
[facility] PROGRESS NOTE Interim History: 38 y.o.male with past medical history outlined below, admitted with fatigue, shortness of breath associated with generalized chest discomfort and fullness, weights over the last 3 or 4 days has been fluctuating, Weight reported 358 lbs in CHF clinic May 26 2014, recorded at 361 lbs on admission 6.14.2015. When he was last seen on June 5, Bumex was increased to 5 mg twice daily. Patient states that his lowest weight home since that time was 350 pounds.   Filed Weights   06/06/14 0517 06/06/14 0952 06/07/14 1610  Weight: 164.1 kg (361 lb 12.4 oz) 164.7 kg (363 lb 1.6 oz) 163.3 kg (360 lb 0.2 oz)        Intake/Output Summary (Last 24 hours) at 06/07/14 1756 Last data filed at 06/07/14 1700  Gross per 24 hour  Intake 2426.3 ml  Output   4825 ml  Net -2398.7 ml     Assessment/Plan:   NICM (nonischemic cardiomyopathy)/Acute on chronic systolic and diastolic heart failure, NYHA class 4: -  Repeat 2-d echo with EF of 15% with diffuse hypokinesis - On lasix ggt and milrinone. - monitor electrolytes, replete - Hard to address fluid balance due to body habitus. - Weight starting to improve - I/Os 2.8L  CKD (chronic kidney disease), stage III - mild increase in Cr. - baseleine Cr ~2.2.  Microcytic anemia: - baseline around 9.0, 6.15.2015 8.5. - started ferrous sulfate. Iron 20, ferritin 55, B12 700, folate > 9.0.  Elevated d-dimer - V/q scan undeterminate - doppler lower ext negative for DVT -do not suspect VTE at this time  Diabetes mellitus - improved, continue  lantus - hbaic 7.6  Hypokalemia - Replete, recheck in am. Pt's is on aldactone. - replete mag and monitor.  HTN (hypertension) - good controlled on current regimen.  Sleep apnea- he reports comliance with Bi Pap - cont bipap.   Morbid obesity with BMI of 50.0-59.9, adult   Code Status: FULL CODE  Family Communication: Wife at Bedside  Disposition Plan:  Keep in Stepdown Bed    Consultants:  cardiology Advance heart failure team  Procedures: ECHO 6.15.2015: estimated ejection fraction was 15%. Diffuse hypokinesis. grade 2 diastolic dysfunction- Left atrium: The atrium was moderately dilated.- Right ventricle: The cavity size was moderately dilated. Systolic function was moderately reduced. Right atrium: The atrium was moderately dilated. Pulmonary arteries: PA peak pressure: 43 mm Hg (S).  V/q scan 4.15.2015: in determine. doppler DVT 6.15.2015: negative.  Antibiotics:  None  HPI/Subjective: Relates SOB is improved. No further chest pain.  Objective: Filed Vitals:   06/07/14 1130 06/07/14 1203 06/07/14 1315 06/07/14 1629  BP:  102/74 113/58 117/84  Pulse:  84  78  Temp: 97.9 F (36.6 C) 97.9 F (36.6 C)  98.6 F (37 C)  TempSrc: Oral Oral  Oral  Resp:  17  13  Height:      Weight:      SpO2:  93%  97%     Exam:  General: in no acute distress. morbidly obese HEENT: No bruits, no goiter.  Heart: Regular rate and rhythm,  Lungs: Good air movement, clear distant. Abdomen: Soft, nontender, nondistended, positive bowel sounds.  EXT: brawny edema   Data Reviewed: Basic Metabolic Panel:  Recent Labs Lab 06/04/14 0010 06/04/14 0845 06/05/14 0510 06/06/14 0335 06/07/14 0500 06/07/14 1310  NA 136*  --  133* 131* 133* 135*  K 2.8*  --  3.2* 3.0* 2.7* 2.9*  CL 90*  --  88* 87* 85* 88*  CO2 28  --  28 27 31 31   GLUCOSE 118*  --  127* 222* 131* 151*  BUN 40*  --  46* 50* 51* 51*  CREATININE 1.89*  --  2.26* 2.39* 2.16* 2.10*  CALCIUM 9.2  --  9.0 8.7 9.3 9.6  MG  --  1.8  --   --   --   --    Liver Function Tests: No results found for this basename: AST, ALT, ALKPHOS, BILITOT, PROT, ALBUMIN,  in the last 168 hours No results found for this basename: LIPASE, AMYLASE,  in the last 168 hours No results found for this basename: AMMONIA,  in the last 168 hours CBC:  Recent Labs Lab 06/04/14 0010 06/05/14 0510   WBC 4.2 5.4  NEUTROABS 3.1  --   HGB 8.6* 8.5*  HCT 27.0* 26.6*  MCV 75.2* 74.3*  PLT 346 351   Cardiac Enzymes:  Recent Labs Lab 06/04/14 0010 06/04/14 0845 06/04/14 1430 06/04/14 2338  TROPONINI <0.30 <0.30 <0.30 <0.30   BNP (last 3 results)  Recent Labs  05/15/14 1200 05/23/14 0600 06/04/14 0010  PROBNP 2056.0* 1770.0* 1808.0*   CBG:  Recent Labs Lab 06/06/14 1606 06/06/14 2011 06/06/14 2357 06/07/14 0324 06/07/14 0814  GLUCAP 174* 147* 199* 162* 115*    Recent Results (from the past 240 hour(s))  MRSA PCR SCREENING     Status: None   Collection Time    06/04/14  6:59 AM      Result Value Ref Range Status   MRSA by PCR NEGATIVE  NEGATIVE Final   Comment:            The GeneXpert MRSA Assay (FDA     approved for NASAL specimens     only), is one component of a     comprehensive MRSA colonization     surveillance program. It is not     intended to diagnose MRSA     infection nor to guide or     monitor treatment for     MRSA infections.  URINE CULTURE     Status: None   Collection Time    06/04/14  1:30 PM      Result Value Ref Range Status   Specimen Description URINE, CLEAN CATCH   Final   Special Requests NONE   Final   Culture  Setup Time     Final   Value: 06/04/2014 21:36     Performed at [facility]   Colony Count     Final   Value: 60,000 COLONIES/ML     Performed at [facility]   Culture     Final   Value: Multiple bacterial morphotypes present, none predominant. Suggest appropriate recollection if clinically indicated.     Performed at [facility]   Report Status 06/05/2014 FINAL   Final     Studies: No results found.  Scheduled Meds: . amiodarone  200 mg Oral BID  . aspirin EC  325 mg Oral Daily  . atorvastatin  80 mg Oral Daily  . carvedilol  3.125 mg Oral BID WC  . enoxaparin (LOVENOX) injection  30 mg Subcutaneous Q24H  . hydrALAZINE  25 mg Oral 3 times per day  . insulin aspart  0-9 Units  Subcutaneous 6 times per day  . insulin glargine  15 Units Subcutaneous BID  . isosorbide mononitrate  60 mg Oral Daily  . loratadine  10 mg Oral Daily  .  magnesium oxide  400 mg Oral BID  . pantoprazole  40 mg Oral Daily  . potassium chloride  40 mEq Oral BID  . sodium chloride  10-40 mL Intracatheter Q12H  . sodium chloride  3 mL Intravenous Q12H  . spironolactone  25 mg Oral Daily   Continuous Infusions: . DOPamine 3 mcg/kg/min (06/07/14 1700)  . furosemide (LASIX) infusion 30 mg/hr (06/07/14 1700)  . milrinone 0.375 mcg/kg/min (06/07/14 1700)     JOSEPH,PREETHA  [facility] Pager [phone] If 8PM-8AM, please contact night-coverage at [email or web address], password [facility] 06/07/2014, 5:56 PM  LOS: 4 days     **Disclaimer: This note may have been dictated with voice recognition software. Similar sounding words can inadvertently be transcribed and this note may contain transcription errors which may not have been corrected upon publication of note.**

## 2014-06-07 NOTE — Clinical Social Work Note (Signed)
Six meal vouchers provided to patient's wife and son. CSW explained that family would not be given any more and would need to make arrangements. Family is very grateful for the assistance. CSW signing off at this time.   Roddie Mc MSW, [place] [place], [phone]  .

## 2014-06-07 NOTE — Progress Notes (Signed)
Advanced Heart Failure Rounding Note   Subjective:    38 yr old AAM with a history of NICM (nor cors Oct 2014), chronic systolic HF ECHO 03/85 EF 20% on chronic home milrinone via PICC, morbid obesity, OSA on BIPAP, HTN, CKD uncontrolled DM, and HLD. He has been followed closely in HF clinic chronic home Milrinone 0.375 mcg , recent d/c from hospital on 05/18/2014 .   Prior to admit he says he did not take bumex on Saturday because he had flank pain.   Readmitted to [facility] with increased dyspnea and leg pain. Pro BNP 1808 and CEs negative. VQ indeterminate for PE.    Dopamine added yesterday due to sluggish urine output and markedly elevated CVP (35). Urine output and renal function improved. Down 3 pounds. CVP 29-30 range. Noncompliant with diet restrictions.     Objective:   Weight Range:  Vital Signs:   Temp:  [97.4 F (36.3 C)-98.2 F (36.8 C)] 97.5 F (36.4 C) (06/17 0700) Pulse Rate:  [75-85] 80 (06/17 0422) Resp:  [13-20] 20 (06/17 0422) BP: (98-123)/(55-79) 98/55 mmHg (06/17 0659) SpO2:  [91 %-100 %] 93 % (06/17 0422) Weight:  [163.3 kg (360 lb 0.2 oz)-164.7 kg (363 lb 1.6 oz)] 163.3 kg (360 lb 0.2 oz) (06/17 0638) Last BM Date: 06/03/14  Weight change: Filed Weights   06/06/14 0517 06/06/14 0952 06/07/14 7619  Weight: 164.1 kg (361 lb 12.4 oz) 164.7 kg (363 lb 1.6 oz) 163.3 kg (360 lb 0.2 oz)    Intake/Output:   Intake/Output Summary (Last 24 hours) at 06/07/14 0913 Last data filed at 06/07/14 0700  Gross per 24 hour  Intake 2261.2 ml  Output   4550 ml  Net -2288.8 ml     Physical Exam:  General: . No resp difficulty. Sitting on the side of the bed.  Wife present.  HEENT: normal Neck: supple. JVP difficult to assess due to body habitus. Carotids 2+ bilat; no bruits. No lymphadenopathy or thryomegaly appreciated. Cor: PMI nondisplaced. Regular rate & rhythm. No rubs, gallops or murmurs. Lungs: clear Abdomen: obese, soft, nontender, distended. No  hepatosplenomegaly. No bruits or masses. Good bowel sounds. Extremities: no cyanosis, clubbing, rash, R and LLE 2+ edema Neuro: alert & orientedx3, cranial nerves grossly intact. moves all 4 extremities w/o difficulty. Affect pleasant  Telemetry: SR-ST 90-100s  Labs: Basic Metabolic Panel:  Recent Labs Lab 06/04/14 0010 06/04/14 0845 06/05/14 0510 06/06/14 0335 06/07/14 0500  NA 136*  --  133* 131* 133*  K 2.8*  --  3.2* 3.0* 2.7*  CL 90*  --  88* 87* 85*  CO2 28  --  28 27 31   GLUCOSE 118*  --  127* 222* 131*  BUN 40*  --  46* 50* 51*  CREATININE 1.89*  --  2.26* 2.39* 2.16*  CALCIUM 9.2  --  9.0 8.7 9.3  MG  --  1.8  --   --   --     Liver Function Tests: No results found for this basename: AST, ALT, ALKPHOS, BILITOT, PROT, ALBUMIN,  in the last 168 hours No results found for this basename: LIPASE, AMYLASE,  in the last 168 hours No results found for this basename: AMMONIA,  in the last 168 hours  CBC:  Recent Labs Lab 06/04/14 0010 06/05/14 0510  WBC 4.2 5.4  NEUTROABS 3.1  --   HGB 8.6* 8.5*  HCT 27.0* 26.6*  MCV 75.2* 74.3*  PLT 346 351    Cardiac Enzymes:  Recent Labs  Lab 06/04/14 0010 06/04/14 0845 06/04/14 1430 06/04/14 2338  TROPONINI <0.30 <0.30 <0.30 <0.30    BNP: BNP (last 3 results)  Recent Labs  05/15/14 1200 05/23/14 0600 06/04/14 0010  PROBNP 2056.0* 1770.0* 1808.0*     Other results:    Imaging: No results found.   Medications:     Scheduled Medications: . amiodarone  200 mg Oral BID  . aspirin EC  325 mg Oral Daily  . atorvastatin  80 mg Oral Daily  . carvedilol  3.125 mg Oral BID WC  . digoxin  0.0625 mg Oral Daily  . enoxaparin (LOVENOX) injection  30 mg Subcutaneous Q24H  . hydrALAZINE  25 mg Oral 3 times per day  . insulin aspart  0-9 Units Subcutaneous 6 times per day  . insulin glargine  15 Units Subcutaneous BID  . isosorbide mononitrate  60 mg Oral Daily  . loratadine  10 mg Oral Daily  . magnesium  oxide  400 mg Oral BID  . pantoprazole  40 mg Oral Daily  . potassium chloride  60 mEq Oral Once  . sodium chloride  10-40 mL Intracatheter Q12H  . sodium chloride  3 mL Intravenous Q12H    Infusions: . DOPamine 3 mcg/kg/min (06/07/14 0600)  . furosemide (LASIX) infusion 30 mg/hr (06/07/14 0600)  . milrinone 0.375 mcg/kg/min (06/07/14 0723)    PRN Medications: sodium chloride, albuterol, oxyCODONE-acetaminophen, sodium chloride, sodium chloride   Assessment:  1) A/C Systolic HF  2) NICM  - EF 30-35% (03/2014)  3) CP  4) HTN  5) DM2  6) OSA  7) CKD, stage III  8) Hypokalemia  9) Anemia   Plan/Discussion:    Volume status remains elevated. But now seems to be responding to dopamine. CVP still close to 30. Will cobtinue current regimen. Supplement potassium.  He remains noncompliant with dietary restriction. Reinforced low salt diet and limiting fluid intake. Not candidate for advanced therapies due to social situation.     Daniel Bensimhon,MD 9:13 AM

## 2014-06-08 ENCOUNTER — Encounter: Payer: Self-pay | Admitting: Internal Medicine

## 2014-06-08 LAB — CARBOXYHEMOGLOBIN
Carboxyhemoglobin: 2.1 % — ABNORMAL HIGH (ref 0.5–1.5)
Methemoglobin: 0.8 % (ref 0.0–1.5)
O2 Saturation: 61 %
Total hemoglobin: 8.6 g/dL — ABNORMAL LOW (ref 13.5–18.0)

## 2014-06-08 LAB — CBC
HEMATOCRIT: 27.3 % — AB (ref 39.0–52.0)
HEMOGLOBIN: 8.6 g/dL — AB (ref 13.0–17.0)
MCH: 23.1 pg — AB (ref 26.0–34.0)
MCHC: 31.5 g/dL (ref 30.0–36.0)
MCV: 73.2 fL — ABNORMAL LOW (ref 78.0–100.0)
Platelets: 331 10*3/uL (ref 150–400)
RBC: 3.73 MIL/uL — ABNORMAL LOW (ref 4.22–5.81)
RDW: 17.4 % — ABNORMAL HIGH (ref 11.5–15.5)
WBC: 5.3 10*3/uL (ref 4.0–10.5)

## 2014-06-08 LAB — BASIC METABOLIC PANEL
BUN: 51 mg/dL — ABNORMAL HIGH (ref 6–23)
CO2: 32 meq/L (ref 19–32)
Calcium: 9.8 mg/dL (ref 8.4–10.5)
Chloride: 84 mEq/L — ABNORMAL LOW (ref 96–112)
Creatinine, Ser: 2.19 mg/dL — ABNORMAL HIGH (ref 0.50–1.35)
GFR calc Af Amer: 43 mL/min — ABNORMAL LOW (ref >90)
GFR calc non Af Amer: 37 mL/min — ABNORMAL LOW (ref >90)
GLUCOSE: 122 mg/dL — AB (ref 70–99)
POTASSIUM: 3 meq/L — AB (ref 3.7–5.3)
Sodium: 131 mEq/L — ABNORMAL LOW (ref 137–147)

## 2014-06-08 LAB — GLUCOSE, CAPILLARY
GLUCOSE-CAPILLARY: 137 mg/dL — AB (ref 70–99)
GLUCOSE-CAPILLARY: 169 mg/dL — AB (ref 70–99)
Glucose-Capillary: 117 mg/dL — ABNORMAL HIGH (ref 70–99)
Glucose-Capillary: 191 mg/dL — ABNORMAL HIGH (ref 70–99)
Glucose-Capillary: 119 mg/dL — ABNORMAL HIGH (ref 70–99)

## 2014-06-08 MED ORDER — POTASSIUM CHLORIDE CRYS ER 20 MEQ PO TBCR
40.0000 meq | EXTENDED_RELEASE_TABLET | Freq: Three times a day (TID) | ORAL | Status: DC
  Administered 2014-06-08 – 2014-06-10 (×8): 40 meq via ORAL
  Filled 2014-06-08 (×10): qty 2

## 2014-06-08 NOTE — Progress Notes (Signed)
Utilization review completed.  

## 2014-06-08 NOTE — Progress Notes (Signed)
Patient states he is able to place himself on /off BIPAP.  Patient wife at bedside . Patient encouraged to call for assitance if needed.

## 2014-06-08 NOTE — Progress Notes (Signed)
[facility] PROGRESS NOTE Interim History: 38 y.o.male with past medical history outlined below, admitted with fatigue, shortness of breath associated with generalized chest discomfort and fullness, weights over the last 3 or 4 days has been fluctuating, Weight reported 358 lbs in CHF clinic May 26 2014, recorded at 361 lbs on admission 6.14.2015. When he was last seen on June 5, Bumex was increased to 5 mg twice daily. Patient states that his lowest weight home since that time was 350 pounds.   Filed Weights   06/06/14 0952 06/07/14 0638 06/08/14 0500  Weight: 164.7 kg (363 lb 1.6 oz) 163.3 kg (360 lb 0.2 oz) 161.4 kg (355 lb 13.2 oz)        Intake/Output Summary (Last 24 hours) at 06/08/14 1610 Last data filed at 06/08/14 0800  Gross per 24 hour  Intake   2472 ml  Output   3800 ml  Net  -1328 ml     Assessment/Plan:   NICM (nonischemic cardiomyopathy)/Acute on chronic systolic and diastolic heart failure, NYHA class 4: -  Repeat 2-d echo with EF of 15% with diffuse hypokinesis - On lasix ggt and milrinone/dopamine per CHF service - monitor electrolytes, replete - Hard to address fluid balance due to body habitus. - Weight starting to improve - I/Os 4.2L  CKD (chronic kidney disease), stage III - stable - [facility]   Microcytic anemia: -likely due to CKD - baseline around 9.0, 6.15.2015 8.5. - started ferrous sulfate. Iron 20, ferritin 55, B12 700, folate > 9.0.  Elevated d-dimer - V/q scan undeterminate - doppler lower ext negative for DVT -do not suspect VTE at this time  Diabetes mellitus - improved, continue  lantus - hbaic 7.6  Hypokalemia - Replete, recheck in am. Pt's is on aldactone. - increase KCL to TID  HTN (hypertension) - good controlled on current regimen.  Sleep apnea- he reports comliance with Bi Pap - cont bipap.   Morbid obesity with BMI of 50.0-59.9, adult   Code Status: FULL CODE  Family Communication: Wife at Bedside   Disposition Plan: Keep in Stepdown Bed    Consultants:  cardiology Advance heart failure team  Procedures: ECHO 6.15.2015: estimated ejection fraction was 15%. Diffuse hypokinesis. grade 2 diastolic dysfunction- Left atrium: The atrium was moderately dilated.- Right ventricle: The cavity size was moderately dilated. Systolic function was moderately reduced. Right atrium: The atrium was moderately dilated. Pulmonary arteries: PA peak pressure: 43 mm Hg (S).  V/q scan 4.15.2015: in determine. doppler DVT 6.15.2015: negative.  Antibiotics:  None  HPI/Subjective: Relates SOB is improved. No further chest pain.  Objective: Filed Vitals:   06/08/14 0359 06/08/14 0500 06/08/14 0700 06/08/14 0835  BP: 117/80   138/72  Pulse: 58   77  Temp: 97.9 F (36.6 C)  98 F (36.7 C)   TempSrc: Oral  Oral   Resp: 17   16  Height:      Weight:  161.4 kg (355 lb 13.2 oz)    SpO2: 100%   100%     Exam:  General: in no acute distress. morbidly obese HEENT: No bruits, no goiter.  Heart: Regular rate and rhythm,  Lungs: Good air movement, clear distant. Abdomen: Soft, nontender, nondistended, positive bowel sounds.  EXT: brawny edema   Data Reviewed: Basic Metabolic Panel:  Recent Labs Lab 06/04/14 0845 06/05/14 0510 06/06/14 0335 06/07/14 0500 06/07/14 1310 06/08/14 0500  NA  --  133* 131* 133* 135* 131*  K  --  3.2*  3.0* 2.7* 2.9* 3.0*  CL  --  88* 87* 85* 88* 84*  CO2  --  28 27 31 31  32  GLUCOSE  --  127* 222* 131* 151* 122*  BUN  --  46* 50* 51* 51* 51*  CREATININE  --  2.26* 2.39* 2.16* 2.10* 2.19*  CALCIUM  --  9.0 8.7 9.3 9.6 9.8  MG 1.8  --   --   --   --   --    Liver Function Tests: No results found for this basename: AST, ALT, ALKPHOS, BILITOT, PROT, ALBUMIN,  in the last 168 hours No results found for this basename: LIPASE, AMYLASE,  in the last 168 hours No results found for this basename: AMMONIA,  in the last 168 hours CBC:  Recent Labs Lab  06/04/14 0010 06/05/14 0510 06/08/14 0500  WBC 4.2 5.4 5.3  NEUTROABS 3.1  --   --   HGB 8.6* 8.5* 8.6*  HCT 27.0* 26.6* 27.3*  MCV 75.2* 74.3* 73.2*  PLT 346 351 331   Cardiac Enzymes:  Recent Labs Lab 06/04/14 0010 06/04/14 0845 06/04/14 1430 06/04/14 2338  TROPONINI <0.30 <0.30 <0.30 <0.30   BNP (last 3 results)  Recent Labs  05/15/14 1200 05/23/14 0600 06/04/14 0010  PROBNP 2056.0* 1770.0* 1808.0*   CBG:  Recent Labs Lab 06/07/14 0814 06/07/14 1121 06/07/14 1606 06/07/14 2032 06/08/14 0358  GLUCAP 115* 158* 161* 218* 137*    Recent Results (from the past 240 hour(s))  MRSA PCR SCREENING     Status: None   Collection Time    06/04/14  6:59 AM      Result Value Ref Range Status   MRSA by PCR NEGATIVE  NEGATIVE Final   Comment:            The GeneXpert MRSA Assay (FDA     approved for NASAL specimens     only), is one component of a     comprehensive MRSA colonization     surveillance program. It is not     intended to diagnose MRSA     infection nor to guide or     monitor treatment for     MRSA infections.  URINE CULTURE     Status: None   Collection Time    06/04/14  1:30 PM      Result Value Ref Range Status   Specimen Description URINE, CLEAN CATCH   Final   Special Requests NONE   Final   Culture  Setup Time     Final   Value: 06/04/2014 21:36     Performed at [facility]   Colony Count     Final   Value: 60,000 COLONIES/ML     Performed at [facility]   Culture     Final   Value: Multiple bacterial morphotypes present, none predominant. Suggest appropriate recollection if clinically indicated.     Performed at [facility]   Report Status 06/05/2014 FINAL   Final     Studies: No results found.  Scheduled Meds: . amiodarone  200 mg Oral BID  . aspirin EC  325 mg Oral Daily  . atorvastatin  80 mg Oral Daily  . carvedilol  3.125 mg Oral BID WC  . enoxaparin (LOVENOX) injection  30 mg Subcutaneous Q24H   . hydrALAZINE  25 mg Oral 3 times per day  . insulin aspart  0-9 Units Subcutaneous 6 times per day  . insulin glargine  15 Units  Subcutaneous BID  . isosorbide mononitrate  60 mg Oral Daily  . loratadine  10 mg Oral Daily  . magnesium oxide  400 mg Oral BID  . pantoprazole  40 mg Oral Daily  . potassium chloride  40 mEq Oral BID  . sodium chloride  10-40 mL Intracatheter Q12H  . sodium chloride  3 mL Intravenous Q12H  . spironolactone  25 mg Oral Daily   Continuous Infusions: . DOPamine 3 mcg/kg/min (06/08/14 0800)  . furosemide (LASIX) infusion 30 mg/hr (06/08/14 0800)  . milrinone 0.375 mcg/kg/min (06/08/14 0800)     JOSEPH,PREETHA  [facility] Pager [phone] If 8PM-8AM, please contact night-coverage at [email or web address], password [facility] 06/08/2014, 9:29 AM  LOS: 5 days     **Disclaimer: This note may have been dictated with voice recognition software. Similar sounding words can inadvertently be transcribed and this note may contain transcription errors which may not have been corrected upon publication of note.**

## 2014-06-08 NOTE — Progress Notes (Signed)
Advanced Heart Failure Rounding Note   Subjective:    38 yr old AAM with a history of NICM (nor cors Oct 2014), chronic systolic HF ECHO 12/6604 EF 20% on chronic home milrinone via PICC, morbid obesity, OSA on BIPAP, HTN, CKD uncontrolled DM, and HLD. He has been followed closely in HF clinic chronic home Milrinone 0.375 mcg , recent d/c from hospital on 05/18/2014 .   Prior to admit he says he did not take bumex on Saturday because he had flank pain.   Readmitted to [facility] with increased dyspnea and leg pain. Pro BNP 1808 and CEs negative. VQ indeterminate for PE.    Dopamine added due to sluggish urine output and markedly elevated CVP (35). Urine output and renal function improving. Down another 5 pounds. CVP 25 Noncompliant with diet restrictions.  Breathing better.     Objective:   Weight Range:  Vital Signs:   Temp:  [97.7 F (36.5 C)-98.7 F (37.1 C)] 98.2 F (36.8 C) (06/18 1145) Pulse Rate:  [49-83] 49 (06/18 1145) Resp:  [13-21] 16 (06/18 1145) BP: (105-138)/(57-84) 107/79 mmHg (06/18 1145) SpO2:  [97 %-100 %] 100 % (06/18 1145) FiO2 (%):  [28 %] 28 % (06/18 0835) Weight:  [161.4 kg (355 lb 13.2 oz)] 161.4 kg (355 lb 13.2 oz) (06/18 0500) Last BM Date: 06/07/14  Weight change: Filed Weights   06/06/14 0952 06/07/14 0638 06/08/14 0500  Weight: 164.7 kg (363 lb 1.6 oz) 163.3 kg (360 lb 0.2 oz) 161.4 kg (355 lb 13.2 oz)    Intake/Output:   Intake/Output Summary (Last 24 hours) at 06/08/14 1404 Last data filed at 06/08/14 1300  Gross per 24 hour  Intake   1982 ml  Output   3850 ml  Net  -1868 ml     Physical Exam:  General: . No resp difficulty. Sitting on the side of the bed.  Wife present.  HEENT: normal Neck: supple. JVP difficult to assess due to body habitus. Carotids 2+ bilat; no bruits. No lymphadenopathy or thryomegaly appreciated. Cor: PMI nondisplaced. Regular rate & rhythm. No rubs, gallops or murmurs. Lungs: clear Abdomen: obese, soft, nontender,  distended. No hepatosplenomegaly. No bruits or masses. Good bowel sounds. Extremities: no cyanosis, clubbing, rash, R and LLE 2+ edema Neuro: alert & orientedx3, cranial nerves grossly intact. moves all 4 extremities w/o difficulty. Affect pleasant  Telemetry: SR-ST 90-100s  Labs: Basic Metabolic Panel:  Recent Labs Lab 06/04/14 0845 06/05/14 0510 06/06/14 0335 06/07/14 0500 06/07/14 1310 06/08/14 0500  NA  --  133* 131* 133* 135* 131*  K  --  3.2* 3.0* 2.7* 2.9* 3.0*  CL  --  88* 87* 85* 88* 84*  CO2  --  28 27 31 31  32  GLUCOSE  --  127* 222* 131* 151* 122*  BUN  --  46* 50* 51* 51* 51*  CREATININE  --  2.26* 2.39* 2.16* 2.10* 2.19*  CALCIUM  --  9.0 8.7 9.3 9.6 9.8  MG 1.8  --   --   --   --   --     Liver Function Tests: No results found for this basename: AST, ALT, ALKPHOS, BILITOT, PROT, ALBUMIN,  in the last 168 hours No results found for this basename: LIPASE, AMYLASE,  in the last 168 hours No results found for this basename: AMMONIA,  in the last 168 hours  CBC:  Recent Labs Lab 06/04/14 0010 06/05/14 0510 06/08/14 0500  WBC 4.2 5.4 5.3  NEUTROABS 3.1  --   --  HGB 8.6* 8.5* 8.6*  HCT 27.0* 26.6* 27.3*  MCV 75.2* 74.3* 73.2*  PLT 346 351 331    Cardiac Enzymes:  Recent Labs Lab 06/04/14 0010 06/04/14 0845 06/04/14 1430 06/04/14 2338  TROPONINI <0.30 <0.30 <0.30 <0.30    BNP: BNP (last 3 results)  Recent Labs  05/15/14 1200 05/23/14 0600 06/04/14 0010  PROBNP 2056.0* 1770.0* 1808.0*     Other results:    Imaging: No results found.   Medications:     Scheduled Medications: . amiodarone  200 mg Oral BID  . aspirin EC  325 mg Oral Daily  . atorvastatin  80 mg Oral Daily  . carvedilol  3.125 mg Oral BID WC  . enoxaparin (LOVENOX) injection  30 mg Subcutaneous Q24H  . hydrALAZINE  25 mg Oral 3 times per day  . insulin aspart  0-9 Units Subcutaneous 6 times per day  . insulin glargine  15 Units Subcutaneous BID  .  isosorbide mononitrate  60 mg Oral Daily  . loratadine  10 mg Oral Daily  . magnesium oxide  400 mg Oral BID  . pantoprazole  40 mg Oral Daily  . potassium chloride  40 mEq Oral TID  . sodium chloride  10-40 mL Intracatheter Q12H  . sodium chloride  3 mL Intravenous Q12H  . spironolactone  25 mg Oral Daily    Infusions: . DOPamine 3 mcg/kg/min (06/08/14 1300)  . furosemide (LASIX) infusion 30 mg/hr (06/08/14 1300)  . milrinone 0.375 mcg/kg/min (06/08/14 1319)    PRN Medications: sodium chloride, albuterol, oxyCODONE-acetaminophen, sodium chloride, sodium chloride   Assessment:  1) A/C Systolic HF  2) NICM  - EF 30-35% (03/2014)  3) CP  4) HTN  5) DM2  6) OSA  7) CKD, stage III  8) Hypokalemia  9) Anemia   Plan/Discussion:    Volume status improving with dopamine.Will continue current regimen. Supplement potassium.  He remains noncompliant with dietary restriction. Reinforced low salt diet and limiting fluid intake. Not candidate for advanced therapies due to social situation.     Daniel Bensimhon,MD 2:04 PM

## 2014-06-09 DIAGNOSIS — E139 Other specified diabetes mellitus without complications: Secondary | ICD-10-CM

## 2014-06-09 LAB — GLUCOSE, CAPILLARY
GLUCOSE-CAPILLARY: 105 mg/dL — AB (ref 70–99)
GLUCOSE-CAPILLARY: 133 mg/dL — AB (ref 70–99)
Glucose-Capillary: 235 mg/dL — ABNORMAL HIGH (ref 70–99)
Glucose-Capillary: 146 mg/dL — ABNORMAL HIGH (ref 70–99)

## 2014-06-09 LAB — CARBOXYHEMOGLOBIN
Carboxyhemoglobin: 2.1 % — ABNORMAL HIGH (ref 0.5–1.5)
METHEMOGLOBIN: 0.8 % (ref 0.0–1.5)
O2 Saturation: 60.6 %
Total hemoglobin: 8.1 g/dL — ABNORMAL LOW (ref 13.5–18.0)

## 2014-06-09 LAB — BASIC METABOLIC PANEL
BUN: 52 mg/dL — ABNORMAL HIGH (ref 6–23)
CALCIUM: 9.2 mg/dL (ref 8.4–10.5)
CO2: 32 mEq/L (ref 19–32)
Chloride: 81 mEq/L — ABNORMAL LOW (ref 96–112)
Creatinine, Ser: 1.9 mg/dL — ABNORMAL HIGH (ref 0.50–1.35)
GFR, EST AFRICAN AMERICAN: 51 mL/min — AB (ref >90)
GFR, EST NON AFRICAN AMERICAN: 44 mL/min — AB (ref >90)
GLUCOSE: 283 mg/dL — AB (ref 70–99)
POTASSIUM: 2.8 meq/L — AB (ref 3.7–5.3)
Sodium: 129 mEq/L — ABNORMAL LOW (ref 137–147)

## 2014-06-09 MED ORDER — POTASSIUM CHLORIDE CRYS ER 20 MEQ PO TBCR
60.0000 meq | EXTENDED_RELEASE_TABLET | Freq: Once | ORAL | Status: AC
Start: 2014-06-09 — End: 2014-06-09
  Administered 2014-06-09: 60 meq via ORAL
  Filled 2014-06-09: qty 3

## 2014-06-09 MED ORDER — SENNOSIDES-DOCUSATE SODIUM 8.6-50 MG PO TABS
1.0000 | ORAL_TABLET | Freq: Two times a day (BID) | ORAL | Status: DC
  Administered 2014-06-09 – 2014-06-13 (×8): 1 via ORAL
  Filled 2014-06-09 (×9): qty 1

## 2014-06-09 MED ORDER — OXYCODONE HCL 5 MG PO TABS
10.0000 mg | ORAL_TABLET | ORAL | Status: AC
  Administered 2014-06-09: 10 mg via ORAL
  Filled 2014-06-09: qty 2

## 2014-06-09 NOTE — Progress Notes (Signed)
Pt states he can put himself on and off BIPAP and has everything he needs. RT will continue to monitor.

## 2014-06-09 NOTE — Progress Notes (Signed)
[facility] PROGRESS NOTE Interim History: 38 y.o.male with past medical history outlined below, admitted with fatigue, shortness of breath associated with generalized chest discomfort and fullness, weights over the last 3 or 4 days has been fluctuating, Weight reported 358 lbs in CHF clinic May 26 2014, recorded at 361 lbs on admission 6.14.2015. When he was last seen on June 5, Bumex was increased to 5 mg twice daily. Patient states that his lowest weight home since that time was 350 pounds.   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 - started ferrous sulfate. Iron 20, ferritin 55, B12 700, folate > 9.0.  Elevated d-dimer - V/q scan undeterminate - doppler lower ext negative for DVT -do not suspect VTE at this time  Diabetes mellitus - improved, continue  lantus - hbaic 7.6  Hypokalemia - Replete, recheck in am. Pt's is on aldactone. - increase KCL to TID  HTN (hypertension) - good controlled on current regimen.  Sleep apnea- he reports comliance with Bi Pap - cont bipap.   Morbid obesity with BMI of 50.0-59.9, adult   Code Status: FULL CODE  Family  Communication: Wife at Bedside  Disposition Plan: Keep in Stepdown Bed    Consultants:  cardiology Advance heart failure team  Procedures: ECHO 6.15.2015: estimated ejection fraction was 15%. Diffuse hypokinesis. grade 2 diastolic dysfunction- Left atrium: The atrium was moderately dilated.- Right ventricle: The cavity size was moderately dilated. Systolic function was moderately reduced. Right atrium: The atrium was moderately dilated. Pulmonary arteries: PA peak pressure: 43 mm Hg (S).  V/q scan 4.15.2015: in determine. doppler DVT 6.15.2015: negative.  Antibiotics:  None  HPI/Subjective: Relates SOB is  Much improved. Ambulating better Objective: Filed Vitals:   06/09/14 0328 06/09/14 0500 06/09/14 0723 06/09/14 0727  BP:    97/65  Pulse:    85  Temp: 97.7 F (36.5 C)  97.8 F (36.6 C)   TempSrc: Oral  Oral   Resp:    17  Height:      Weight:  151 kg (332 lb 14.3 oz)    SpO2:    96%     Exam:  General: in no acute distress. morbidly obese HEENT: No bruits, no goiter.  Heart: Regular rate and rhythm,  Lungs: Good air movement, clear distant. Abdomen: Soft, nontender, nondistended, positive bowel sounds.  EXT: brawny edema   Data Reviewed: Basic Metabolic Panel:  Recent Labs Lab 06/04/14 0845 06/05/14 0510 06/06/14 0335 06/07/14 0500 06/07/14 1310 06/08/14 0500  NA  --  133* 131* 133* 135* 131*  K  --  3.2* 3.0* 2.7*  2.9* 3.0*  CL  --  88* 87* 85* 88* 84*  CO2  --  28 27 31 31  32  GLUCOSE  --  127* 222* 131* 151* 122*  BUN  --  46* 50* 51* 51* 51*  CREATININE  --  2.26* 2.39* 2.16* 2.10* 2.19*  CALCIUM  --  9.0 8.7 9.3 9.6 9.8  MG 1.8  --   --   --   --   --    Liver Function Tests: No results found for this basename: AST, ALT, ALKPHOS, BILITOT, PROT, ALBUMIN,  in the last 168 hours No results found for this basename: LIPASE, AMYLASE,  in the last 168 hours No results found for this basename: AMMONIA,  in the last 168 hours CBC:  Recent  Labs Lab 06/04/14 0010 06/05/14 0510 06/08/14 0500  WBC 4.2 5.4 5.3  NEUTROABS 3.1  --   --   HGB 8.6* 8.5* 8.6*  HCT 27.0* 26.6* 27.3*  MCV 75.2* 74.3* 73.2*  PLT 346 351 331   Cardiac Enzymes:  Recent Labs Lab 06/04/14 0010 06/04/14 0845 06/04/14 1430 06/04/14 2338  TROPONINI <0.30 <0.30 <0.30 <0.30   BNP (last 3 results)  Recent Labs  05/15/14 1200 05/23/14 0600 06/04/14 0010  PROBNP 2056.0* 1770.0* 1808.0*   CBG:  Recent Labs Lab 06/08/14 1139 06/08/14 1451 06/08/14 1917 06/08/14 2317 06/09/14 0725  GLUCAP 169* 191* 119* 235* 105*    Recent Results (from the past 240 hour(s))  MRSA PCR SCREENING     Status: None   Collection Time    06/04/14  6:59 AM      Result Value Ref Range Status   MRSA by PCR NEGATIVE  NEGATIVE Final   Comment:            The GeneXpert MRSA Assay (FDA     approved for NASAL specimens     only), is one component of a     comprehensive MRSA colonization     surveillance program. It is not     intended to diagnose MRSA     infection nor to guide or     monitor treatment for     MRSA infections.  URINE CULTURE     Status: None   Collection Time    06/04/14  1:30 PM      Result Value Ref Range Status   Specimen Description URINE, CLEAN CATCH   Final   Special Requests NONE   Final   Culture  Setup Time     Final   Value: 06/04/2014 21:36     Performed at [facility] Count     Final   Value: 60,000 COLONIES/ML     Performed at [facility]   Culture     Final   Value: Multiple bacterial morphotypes present, none predominant. Suggest appropriate recollection if clinically indicated.     Performed at [facility]   Report Status 06/05/2014 FINAL   Final     Studies: No results found.  Scheduled Meds: . amiodarone  200 mg Oral BID  . aspirin EC  325 mg Oral Daily  . atorvastatin  80 mg Oral Daily  . carvedilol  3.125 mg Oral BID WC  . enoxaparin (LOVENOX) injection  30 mg  Subcutaneous Q24H  . hydrALAZINE  25 mg Oral 3 times per day  . insulin aspart  0-9 Units Subcutaneous 6 times per day  . insulin glargine  15 Units Subcutaneous BID  .  isosorbide mononitrate  60 mg Oral Daily  . loratadine  10 mg Oral Daily  . magnesium oxide  400 mg Oral BID  . pantoprazole  40 mg Oral Daily  . potassium chloride  40 mEq Oral TID  . sodium chloride  10-40 mL Intracatheter Q12H  . spironolactone  25 mg Oral Daily   Continuous Infusions: . DOPamine 3 mcg/kg/min (06/09/14 0900)  . furosemide (LASIX) infusion 30 mg/hr (06/09/14 0900)  . milrinone 0.375 mcg/kg/min (06/09/14 1051)     Steven Wyatt  [facility] Pager [phone] If 8PM-8AM, please contact night-coverage at [email or web address], password [email or web address] 06/09/2014, 11:57 AM  LOS: 6 days     **Disclaimer: This note may have been dictated with voice recognition software. Similar sounding words can inadvertently be transcribed and this note may contain transcription errors which may not have been corrected upon publication of note.**

## 2014-06-09 NOTE — Progress Notes (Signed)
06/09/2014 4:24 PM  Patient's [name] is in the 80's. Patient is asymptomatic. CVP is now 19.  Called Dr. Gala Romney will update.  Will continue to monitor.   Steven Wyatt

## 2014-06-09 NOTE — Progress Notes (Signed)
Advanced Heart Failure Rounding Note   Subjective:    38 yr old AAM with a history of NICM (nor cors Oct 2014), chronic systolic HF ECHO 4/09811/2015 EF 20% on chronic home milrinone via PICC, morbid obesity, OSA on BIPAP, HTN, CKD uncontrolled DM, and HLD. He has been followed closely in HF clinic chronic home Milrinone 0.375 mcg , recent d/c from hospital on 05/18/2014 .   Prior to admit he says he did not take bumex on Saturday because he had flank pain.   [facility]. Pro BNP 1808 and CEs negative. VQ indeterminate for PE.    Dopamine added due to sluggish urine output and markedly elevated CVP (35). Continues to diurese. Down another 3 pounds. CVP remains 26.  Noncompliant with diet restrictions.  Denies dyspnea or CP.     Objective:   Weight Range:  Vital Signs:   Temp:  [97.7 F (36.5 C)-98.6 F (37 C)] 97.7 F (36.5 C) (06/19 0328) Pulse Rate:  [34-80] 80 (06/19 0326) Resp:  [12-32] 12 (06/19 0326) BP: (93-138)/(44-79) 93/44 mmHg (06/19 0326) SpO2:  [96 %-100 %] 100 % (06/19 0326) FiO2 (%):  [28 %] 28 % (06/18 0835) Weight:  [151 kg (332 lb 14.3 oz)] 151 kg (332 lb 14.3 oz) (06/19 0500) Last BM Date: 06/07/14  Weight change: Filed Weights   06/07/14 0638 06/08/14 0500 06/09/14 0500  Weight: 163.3 kg (360 lb 0.2 oz) 161.4 kg (355 lb 13.2 oz) 151 kg (332 lb 14.3 oz)    Intake/Output:   Intake/Output Summary (Last 24 hours) at 06/09/14 0614 Last data filed at 06/09/14 0518  Gross per 24 hour  Intake 2011.3 ml  Output   4475 ml  Net -2463.7 ml     Physical Exam:  General: . No resp difficulty. Sitting on the side of the bed.  Wife present.  HEENT: normal Neck: supple. JVP difficult to assess due to body habitus. Carotids 2+ bilat; no bruits. No lymphadenopathy or thryomegaly appreciated. Cor: PMI nondisplaced. Regular rate & rhythm. No rubs, gallops or murmurs. Lungs: clear Abdomen: obese, soft, nontender, distended. No  hepatosplenomegaly. No bruits or masses. Good bowel sounds. Extremities: no cyanosis, clubbing, rash, R and LLE 2+ edema Neuro: alert & orientedx3, cranial nerves grossly intact. moves all 4 extremities w/o difficulty. Affect pleasant  Telemetry: SR-ST 90-100s  Labs: Basic Metabolic Panel:  Recent Labs Lab 06/04/14 0845 06/05/14 0510 06/06/14 0335 06/07/14 0500 06/07/14 1310 06/08/14 0500  NA  --  133* 131* 133* 135* 131*  K  --  3.2* 3.0* 2.7* 2.9* 3.0*  CL  --  88* 87* 85* 88* 84*  CO2  --  28 27 31 31  32  GLUCOSE  --  127* 222* 131* 151* 122*  BUN  --  46* 50* 51* 51* 51*  CREATININE  --  2.26* 2.39* 2.16* 2.10* 2.19*  CALCIUM  --  9.0 8.7 9.3 9.6 9.8  MG 1.8  --   --   --   --   --     Liver Function Tests: No results found for this basename: AST, ALT, ALKPHOS, BILITOT, PROT, ALBUMIN,  in the last 168 hours No results found for this basename: LIPASE, AMYLASE,  in the last 168 hours No results found for this basename: AMMONIA,  in the last 168 hours  CBC:  Recent Labs Lab 06/04/14 0010 06/05/14 0510 06/08/14 0500  WBC 4.2 5.4 5.3  NEUTROABS 3.1  --   --  HGB 8.6* 8.5* 8.6*  HCT 27.0* 26.6* 27.3*  MCV 75.2* 74.3* 73.2*  PLT 346 351 331    Cardiac Enzymes:  Recent Labs Lab 06/04/14 0010 06/04/14 0845 06/04/14 1430 06/04/14 2338  TROPONINI <0.30 <0.30 <0.30 <0.30    BNP: BNP (last 3 results)  Recent Labs  05/15/14 1200 05/23/14 0600 06/04/14 0010  PROBNP 2056.0* 1770.0* 1808.0*     Other results:    Imaging: No results found.   Medications:     Scheduled Medications: . amiodarone  200 mg Oral BID  . aspirin EC  325 mg Oral Daily  . atorvastatin  80 mg Oral Daily  . carvedilol  3.125 mg Oral BID WC  . enoxaparin (LOVENOX) injection  30 mg Subcutaneous Q24H  . hydrALAZINE  25 mg Oral 3 times per day  . insulin aspart  0-9 Units Subcutaneous 6 times per day  . insulin glargine  15 Units Subcutaneous BID  . isosorbide mononitrate   60 mg Oral Daily  . loratadine  10 mg Oral Daily  . magnesium oxide  400 mg Oral BID  . pantoprazole  40 mg Oral Daily  . potassium chloride  40 mEq Oral TID  . sodium chloride  10-40 mL Intracatheter Q12H  . spironolactone  25 mg Oral Daily    Infusions: . DOPamine 3 mcg/kg/min (06/09/14 0518)  . furosemide (LASIX) infusion 30 mg/hr (06/09/14 0518)  . milrinone 0.375 mcg/kg/min (06/09/14 0518)    PRN Medications: sodium chloride, albuterol, oxyCODONE-acetaminophen, sodium chloride   Assessment:  1) A/C Systolic HF  2) NICM  - EF 30-35% (03/2014)  3) CP  4) HTN  5) DM2  6) OSA  7) CKD, stage III  8) Hypokalemia  9) Anemia   Plan/Discussion:    Weight improving but CVP unchanged with dopamine and high-dose lasix gtt. Will continue current regimen. Watch renal function. Supplement potassium. If renal function getting worse and CVP not improving will need RHC to further evaluate.    He remains noncompliant with dietary restriction. Reinforced low salt diet and limiting fluid intake. Not candidate for advanced therapies due to social situation.     Lamorris Knoblock,MD 6:14 AM

## 2014-06-09 NOTE — Progress Notes (Signed)
Came by to see if patient needed some assistance with the vision bipap. Patient stated he could place mask on by himself. Will continue to monitor patient.

## 2014-06-09 NOTE — Progress Notes (Signed)
PT Cancellation Note  Patient Details Name: Steven Wyatt. MRN: [id number] DOB: 13-Apr-1976   Cancelled Treatment:    Reason Eval/Treat Not Completed: PT screened, no needs identified, will sign off. Pt reports mobilizing  In room without difficulty. Pt reports his biggest issue is getting a motorized chair to increase his ability to mobilize in the community. Pt reports he is attempting to communicate with [facility] on this issue.    Rada Hay 06/09/2014, 4:42 PM Blanchard Kelch PT [phone]

## 2014-06-10 LAB — CARBOXYHEMOGLOBIN
CARBOXYHEMOGLOBIN: 1.9 % — AB (ref 0.5–1.5)
Methemoglobin: 1.2 % (ref 0.0–1.5)
O2 Saturation: 53.2 %
Total hemoglobin: 9 g/dL — ABNORMAL LOW (ref 13.5–18.0)

## 2014-06-10 LAB — BASIC METABOLIC PANEL
BUN: 53 mg/dL — ABNORMAL HIGH (ref 6–23)
CO2: 34 meq/L — AB (ref 19–32)
Calcium: 10.2 mg/dL (ref 8.4–10.5)
Chloride: 85 mEq/L — ABNORMAL LOW (ref 96–112)
Creatinine, Ser: 2.13 mg/dL — ABNORMAL HIGH (ref 0.50–1.35)
GFR calc non Af Amer: 38 mL/min — ABNORMAL LOW (ref >90)
GFR, EST AFRICAN AMERICAN: 45 mL/min — AB (ref >90)
Glucose, Bld: 94 mg/dL (ref 70–99)
POTASSIUM: 3.5 meq/L — AB (ref 3.7–5.3)
Sodium: 134 mEq/L — ABNORMAL LOW (ref 137–147)

## 2014-06-10 LAB — GLUCOSE, CAPILLARY
GLUCOSE-CAPILLARY: 169 mg/dL — AB (ref 70–99)
GLUCOSE-CAPILLARY: 199 mg/dL — AB (ref 70–99)
GLUCOSE-CAPILLARY: 248 mg/dL — AB (ref 70–99)
Glucose-Capillary: 97 mg/dL (ref 70–99)
Glucose-Capillary: 95 mg/dL (ref 70–99)
Glucose-Capillary: 151 mg/dL — ABNORMAL HIGH (ref 70–99)

## 2014-06-10 NOTE — Progress Notes (Signed)
Advanced Heart Failure Rounding Note   Subjective:    38 yr old AAM with a history of NICM (nor cors Oct 2014), chronic systolic HF ECHO 04/9162 EF 20% on chronic home milrinone via PICC, morbid obesity, OSA on BIPAP, HTN, CKD uncontrolled DM, and HLD. He has been followed closely in HF clinic chronic home Milrinone 0.375 mcg , recent d/c from hospital on 05/18/2014 .   Prior to admit he says he did not take bumex on Saturday because he had flank pain.   Readmitted to [facility] with increased dyspnea and leg pain. Pro BNP 1808 and CEs negative. VQ indeterminate for PE.    Dopamine added due to sluggish urine output and markedly elevated CVP (35). I/O -890. Weight 349. CVP 34. Denies dyspnea or CP.     Objective:   Weight Range:  Vital Signs:   Temp:  [96.3 F (35.7 C)-97.8 F (36.6 C)] 96.3 F (35.7 C) (06/20 0725) Pulse Rate:  [74-87] 79 (06/20 0411) Resp:  [15-20] 20 (06/20 0411) BP: (85-122)/(50-69) 112/68 mmHg (06/20 0411) SpO2:  [93 %-100 %] 100 % (06/20 0411) Weight:  [349 lb 13.9 oz (158.7 kg)] 349 lb 13.9 oz (158.7 kg) (06/20 0411) Last BM Date: 06/07/14  Weight change: Filed Weights   06/08/14 0500 06/09/14 0500 06/10/14 0411  Weight: 355 lb 13.2 oz (161.4 kg) 332 lb 14.3 oz (151 kg) 349 lb 13.9 oz (158.7 kg)    Intake/Output:   Intake/Output Summary (Last 24 hours) at 06/10/14 0843 Last data filed at 06/10/14 0726  Gross per 24 hour  Intake 1936.3 ml  Output   3150 ml  Net -1213.7 ml     Physical Exam:  General: . No resp difficulty.  HEENT: normal Neck: supple. JVP difficult to assess due to body habitus.  Cor: Regular rate & rhythm. +S3 Lungs: clear Abdomen: obese, soft, nontender, distended. Extremities: 1+ edema Neuro: grossly intact  Telemetry: SR with PVCs  Labs: Basic Metabolic Panel:  Recent Labs Lab 06/04/14 0845  06/07/14 0500 06/07/14 1310 06/08/14 0500 06/09/14 1135 06/10/14 0430  NA  --   < > 133* 135* 131* 129* 134*  K  --   < >  2.7* 2.9* 3.0* 2.8* 3.5*  CL  --   < > 85* 88* 84* 81* 85*  CO2  --   < > 31 31 32 32 34*  GLUCOSE  --   < > 131* 151* 122* 283* 94  BUN  --   < > 51* 51* 51* 52* 53*  CREATININE  --   < > 2.16* 2.10* 2.19* 1.90* 2.13*  CALCIUM  --   < > 9.3 9.6 9.8 9.2 10.2  MG 1.8  --   --   --   --   --   --   < > = values in this interval not displayed.   CBC:  Recent Labs Lab 06/04/14 0010 06/05/14 0510 06/08/14 0500  WBC 4.2 5.4 5.3  NEUTROABS 3.1  --   --   HGB 8.6* 8.5* 8.6*  HCT 27.0* 26.6* 27.3*  MCV 75.2* 74.3* 73.2*  PLT 346 351 331    Cardiac Enzymes:  Recent Labs Lab 06/04/14 0010 06/04/14 0845 06/04/14 1430 06/04/14 2338  TROPONINI <0.30 <0.30 <0.30 <0.30    BNP: BNP (last 3 results)  Recent Labs  05/15/14 1200 05/23/14 0600 06/04/14 0010  PROBNP 2056.0* 1770.0* 1808.0*     Other results:    Imaging: No results found.   Medications:  Scheduled Medications: . amiodarone  200 mg Oral BID  . aspirin EC  325 mg Oral Daily  . atorvastatin  80 mg Oral Daily  . carvedilol  3.125 mg Oral BID WC  . enoxaparin (LOVENOX) injection  30 mg Subcutaneous Q24H  . hydrALAZINE  25 mg Oral 3 times per day  . insulin aspart  0-9 Units Subcutaneous 6 times per day  . insulin glargine  15 Units Subcutaneous BID  . isosorbide mononitrate  60 mg Oral Daily  . loratadine  10 mg Oral Daily  . magnesium oxide  400 mg Oral BID  . pantoprazole  40 mg Oral Daily  . potassium chloride  40 mEq Oral TID  . senna-docusate  1 tablet Oral BID  . sodium chloride  10-40 mL Intracatheter Q12H  . spironolactone  25 mg Oral Daily    Infusions: . DOPamine 3 mcg/kg/min (06/10/14 0700)  . furosemide (LASIX) infusion 30 mg/hr (06/10/14 0700)  . milrinone 0.375 mcg/kg/min (06/10/14 0700)    PRN Medications: sodium chloride, albuterol, oxyCODONE-acetaminophen, sodium chloride   Assessment:  1) A/C Systolic HF  2) NICM  - EF 30-35% (03/2014)  3) CP  4) HTN  5) DM2  6)  OSA  7) CKD, stage III  8) Hypokalemia  9) Anemia   Plan/Discussion:    CVP unchanged with dopamine and high-dose lasix gtt. I/O -890. Will continue current regimen. Watch renal function. Supplement potassium. If renal function getting worse and CVP not improving will need [facility] to further evaluate. [facility] but not ready.     Ripley FraiseBrian Crenshaw,MD 8:43 AM

## 2014-06-10 NOTE — Progress Notes (Addendum)
[facility] PROGRESS NOTE Interim History: 38 y.o.male with past medical history outlined below, admitted with fatigue, shortness of breath associated with generalized chest discomfort and fullness, weights over the last 3 or 4 days has been fluctuating, Weight reported 358 lbs in CHF clinic May 26 2014, recorded at 361 lbs on admission 6.14.2015. When he was last seen on June 5, Bumex was increased to 5 mg twice daily. Patient states that his lowest weight home since that time was 350 pounds.   Filed Weights   06/08/14 0500 06/09/14 0500 06/10/14 0411  Weight: 161.4 kg (355 lb 13.2 oz) 151 kg (332 lb 14.3 oz) 158.7 kg (349 lb 13.9 oz)        Intake/Output Summary (Last 24 hours) at 06/10/14 1116 Last data filed at 06/10/14 1610  Gross per 24 hour  Intake   1642 ml  Output   2800 ml  Net  -1158 ml     Assessment/Plan:   NICM (nonischemic cardiomyopathy)/Acute on chronic systolic and diastolic heart failure, NYHA class 4: -  Repeat 2-d echo with EF of 15% with diffuse hypokinesis - On lasix ggt and milrinone/dopamine per CHF service - poor compliance with diet/fluid restriction  - monitor electrolytes, replete - Weight improving, 158kg now - I/Os negative 7.1L  CKD (chronic kidney disease), stage III - stable - back at [facility]  Microcytic anemia: -likely due to CKD - baseline around 9.0, 6.15.2015 8.5. - started ferrous sulfate. Iron 20, ferritin 55, B12 700, folate > 9.0.  Elevated d-dimer - V/q scan undeterminate - doppler lower ext negative for DVT -do not suspect VTE at this time  Diabetes mellitus - improved, continue  lantus - hbaic 7.6  Hypokalemia - Replete, recheck in am. Pt's is on aldactone. - continue KCL TID  HTN (hypertension) - good controlled on current regimen.  Sleep apnea- he reports compliance with Bi Pap - cont bipap.   Morbid obesity with BMI of 50.0-59.9, adult   Code Status: FULL CODE  Family Communication: Wife at  Bedside  Disposition Plan: home per Cards    Consultants:  cardiology Advance heart failure team  Procedures: ECHO 6.15.2015: estimated ejection fraction was 15%. Diffuse hypokinesis. grade 2 diastolic dysfunction- Left atrium: The atrium was moderately dilated.- Right ventricle: The cavity size was moderately dilated. Systolic function was moderately reduced. Right atrium: The atrium was moderately dilated. Pulmonary arteries: PA peak pressure: 43 mm Hg (S).  V/q scan 4.15.2015: in determine. doppler DVT 6.15.2015: negative.  Antibiotics:  None  HPI/Subjective: Relates SOB is  Much improved. Ambulating better Objective: Filed Vitals:   06/09/14 2345 06/10/14 0411 06/10/14 0725 06/10/14 1100  BP: 112/69 112/68    Pulse: 87 79    Temp: 97.4 F (36.3 C) 97.8 F (36.6 C) 96.3 F (35.7 C) 97.7 F (36.5 C)  TempSrc: Oral Oral Axillary Oral  Resp: 19 20    Height:      Weight:  158.7 kg (349 lb 13.9 oz)    SpO2: 94% 100%       Exam:  General: in no acute distress. morbidly obese HEENT: No bruits, no goiter.  Heart: Regular rate and rhythm,  Lungs: Good air movement, clear distant. Abdomen: Soft, nontender, nondistended, positive bowel sounds.  EXT: brawny edema   Data Reviewed: Basic Metabolic Panel:  Recent Labs Lab 06/04/14 0845  06/07/14 0500 06/07/14 1310 06/08/14 0500 06/09/14 1135 06/10/14 0430  NA  --   < > 133* 135* 131* 129* 134*  K  --   < >  2.7* 2.9* 3.0* 2.8* 3.5*  CL  --   < > 85* 88* 84* 81* 85*  CO2  --   < > 31 31 32 32 34*  GLUCOSE  --   < > 131* 151* 122* 283* 94  BUN  --   < > 51* 51* 51* 52* 53*  CREATININE  --   < > 2.16* 2.10* 2.19* 1.90* 2.13*  CALCIUM  --   < > 9.3 9.6 9.8 9.2 10.2  MG 1.8  --   --   --   --   --   --   < > = values in this interval not displayed. Liver Function Tests: No results found for this basename: AST, ALT, ALKPHOS, BILITOT, PROT, ALBUMIN,  in the last 168 hours No results found for this basename:  LIPASE, AMYLASE,  in the last 168 hours No results found for this basename: AMMONIA,  in the last 168 hours CBC:  Recent Labs Lab 06/04/14 0010 06/05/14 0510 06/08/14 0500  WBC 4.2 5.4 5.3  NEUTROABS 3.1  --   --   HGB 8.6* 8.5* 8.6*  HCT 27.0* 26.6* 27.3*  MCV 75.2* 74.3* 73.2*  PLT 346 351 331   Cardiac Enzymes:  Recent Labs Lab 06/04/14 0010 06/04/14 0845 06/04/14 1430 06/04/14 2338  TROPONINI <0.30 <0.30 <0.30 <0.30   BNP (last 3 results)  Recent Labs  05/15/14 1200 05/23/14 0600 06/04/14 0010  PROBNP 2056.0* 1770.0* 1808.0*   CBG:  Recent Labs Lab 06/09/14 1533 06/09/14 2117 06/09/14 2347 06/10/14 0438 06/10/14 0727  GLUCAP 133* 199* 248* 97 95    Recent Results (from the past 240 hour(s))  MRSA PCR SCREENING     Status: None   Collection Time    06/04/14  6:59 AM      Result Value Ref Range Status   MRSA by PCR NEGATIVE  NEGATIVE Final   Comment:            The GeneXpert MRSA Assay (FDA     approved for NASAL specimens     only), is one component of a     comprehensive MRSA colonization     surveillance program. It is not     intended to diagnose MRSA     infection nor to guide or     monitor treatment for     MRSA infections.  URINE CULTURE     Status: None   Collection Time    06/04/14  1:30 PM      Result Value Ref Range Status   Specimen Description URINE, CLEAN CATCH   Final   Special Requests NONE   Final   Culture  Setup Time     Final   Value: 06/04/2014 21:36     Performed at [facility] Count     Final   Value: 60,000 COLONIES/ML     Performed at [facility]   Culture     Final   Value: Multiple bacterial morphotypes present, none predominant. Suggest appropriate recollection if clinically indicated.     Performed at [facility]   Report Status 06/05/2014 FINAL   Final     Studies: No results found.  Scheduled Meds: . amiodarone  200 mg Oral BID  . aspirin EC  325 mg Oral Daily    . atorvastatin  80 mg Oral Daily  . carvedilol  3.125 mg Oral BID WC  . enoxaparin (LOVENOX) injection  30 mg Subcutaneous  Q24H  . hydrALAZINE  25 mg Oral 3 times per day  . insulin aspart  0-9 Units Subcutaneous 6 times per day  . insulin glargine  15 Units Subcutaneous BID  . isosorbide mononitrate  60 mg Oral Daily  . loratadine  10 mg Oral Daily  . magnesium oxide  400 mg Oral BID  . pantoprazole  40 mg Oral Daily  . potassium chloride  40 mEq Oral TID  . senna-docusate  1 tablet Oral BID  . sodium chloride  10-40 mL Intracatheter Q12H  . spironolactone  25 mg Oral Daily   Continuous Infusions: . DOPamine 3 mcg/kg/min (06/10/14 0700)  . furosemide (LASIX) infusion 30 mg/hr (06/10/14 0700)  . milrinone 0.375 mcg/kg/min (06/10/14 0700)     JOSEPH,PREETHA  [facility] Pager [phone] If 8PM-8AM, please contact night-coverage at [email or web address], password [email or web address] 06/10/2014, 11:16 AM  LOS: 7 days     **Disclaimer: This note may have been dictated with voice recognition software. Similar sounding words can inadvertently be transcribed and this note may contain transcription errors which may not have been corrected upon publication of note.**

## 2014-06-10 NOTE — Progress Notes (Signed)
Pt on side of bed eating lunch with family at bedside.  Pt reports that he ambulated 20 minutes in the hallway this morning.  Pt declines to ambulate due just receiving his lunch tray.  Pt assures rehab staff that he will ambulate this afternoon with wife. Alanson Aly, BSN

## 2014-06-11 LAB — BASIC METABOLIC PANEL WITH GFR
BUN: 44 mg/dL — ABNORMAL HIGH (ref 6–23)
BUN: 51 mg/dL — ABNORMAL HIGH (ref 6–23)
CO2: 31 meq/L (ref 19–32)
CO2: 37 meq/L — ABNORMAL HIGH (ref 19–32)
Calcium: 8.6 mg/dL (ref 8.4–10.5)
Calcium: 10.5 mg/dL (ref 8.4–10.5)
Chloride: 73 meq/L — ABNORMAL LOW (ref 96–112)
Chloride: 86 meq/L — ABNORMAL LOW (ref 96–112)
Creatinine, Ser: 0.76 mg/dL (ref 0.50–1.35)
Creatinine, Ser: 1.92 mg/dL — ABNORMAL HIGH (ref 0.50–1.35)
GFR calc Af Amer: >90 mL/min
GFR calc Af Amer: 51 mL/min — ABNORMAL LOW
GFR calc non Af Amer: >90 mL/min
GFR calc non Af Amer: 44 mL/min — ABNORMAL LOW
Glucose, Bld: 785 mg/dL (ref 70–99)
Glucose, Bld: 142 mg/dL — ABNORMAL HIGH (ref 70–99)
Potassium: 3 meq/L — ABNORMAL LOW (ref 3.7–5.3)
Potassium: 3.3 meq/L — ABNORMAL LOW (ref 3.7–5.3)
Sodium: 116 meq/L — CL (ref 137–147)
Sodium: 135 meq/L — ABNORMAL LOW (ref 137–147)

## 2014-06-11 LAB — CBC
HCT: 25.1 % — ABNORMAL LOW (ref 39.0–52.0)
Hemoglobin: 7.8 g/dL — ABNORMAL LOW (ref 13.0–17.0)
MCH: 22.6 pg — ABNORMAL LOW (ref 26.0–34.0)
MCHC: 31.1 g/dL (ref 30.0–36.0)
MCV: 72.8 fL — ABNORMAL LOW (ref 78.0–100.0)
Platelets: 287 K/uL (ref 150–400)
RBC: 3.45 MIL/uL — ABNORMAL LOW (ref 4.22–5.81)
RDW: 17.9 % — ABNORMAL HIGH (ref 11.5–15.5)
WBC: 4.5 K/uL (ref 4.0–10.5)

## 2014-06-11 LAB — CARBOXYHEMOGLOBIN
Carboxyhemoglobin: 2.3 % — ABNORMAL HIGH (ref 0.5–1.5)
Carboxyhemoglobin: 1.8 % — ABNORMAL HIGH (ref 0.5–1.5)
Methemoglobin: 1 % (ref 0.0–1.5)
Methemoglobin: 1 % (ref 0.0–1.5)
O2 Saturation: 66 %
O2 Saturation: 55.5 %
Total hemoglobin: 6.3 g/dL — CL (ref 13.5–18.0)
Total hemoglobin: 12.8 g/dL — ABNORMAL LOW (ref 13.5–18.0)

## 2014-06-11 LAB — GLUCOSE, CAPILLARY
GLUCOSE-CAPILLARY: 190 mg/dL — AB (ref 70–99)
Glucose-Capillary: 142 mg/dL — ABNORMAL HIGH (ref 70–99)

## 2014-06-11 MED ORDER — ENOXAPARIN SODIUM 80 MG/0.8ML ~~LOC~~ SOLN
80.0000 mg | SUBCUTANEOUS | Status: DC
  Filled 2014-06-11 (×2): qty 0.8

## 2014-06-11 MED ORDER — POTASSIUM CHLORIDE CRYS ER 10 MEQ PO TBCR
40.0000 meq | EXTENDED_RELEASE_TABLET | Freq: Three times a day (TID) | ORAL | Status: DC
  Administered 2014-06-11 – 2014-06-13 (×7): 40 meq via ORAL
  Filled 2014-06-11 (×9): qty 4

## 2014-06-11 NOTE — Progress Notes (Signed)
Subjective:    38 yr old AAM with a history of NICM (nor cors Oct 2014), chronic systolic HF ECHO 02/3006 EF 20% on chronic home milrinone via PICC, morbid obesity, OSA on BIPAP, HTN, CKD uncontrolled DM, and HLD. He has been followed closely in HF clinic chronic home Milrinone 0.375 mcg , recent d/c from hospital on 05/18/2014 .   Prior to admit he says he did not take bumex on Saturday because he had flank pain.   Readmitted to [facility] with increased dyspnea and leg pain. Pro BNP 1808 and CEs negative. VQ indeterminate for PE.    Dopamine added due to sluggish urine output and markedly elevated CVP (35). I/O -890. Weight 349. CVP 34. Denies dyspnea or CP.     Objective:   Weight Range:  Vital Signs:   Temp:  [97.3 F (36.3 C)-98 F (36.7 C)] 97.7 F (36.5 C) (06/21 0729) Pulse Rate:  [80-87] 85 (06/21 0729) Resp:  [16-20] 19 (06/21 0731) BP: (96-124)/(53-95) 123/95 mmHg (06/21 0731) SpO2:  [94 %-99 %] 99 % (06/21 0729) Weight:  [353 lb 6.4 oz (160.3 kg)] 353 lb 6.4 oz (160.3 kg) (06/21 0304) Last BM Date: 06/07/14  Weight change: Filed Weights   06/09/14 0500 06/10/14 0411 06/11/14 0304  Weight: 332 lb 14.3 oz (151 kg) 349 lb 13.9 oz (158.7 kg) 353 lb 6.4 oz (160.3 kg)    Intake/Output:   Intake/Output Summary (Last 24 hours) at 06/11/14 0948 Last data filed at 06/11/14 0800  Gross per 24 hour  Intake 1302.9 ml  Output   5950 ml  Net -4647.1 ml     Physical Exam:  General: . No resp difficulty.  HEENT: normal Neck: supple. JVP difficult to assess due to body habitus.  Cor: Regular rate & rhythm. +S3 Lungs: clear Abdomen: obese, soft, nontender, distended. Extremities: 1+ edema Neuro: grossly intact  Telemetry: SR with PVCs  Labs: Basic Metabolic Panel:  Recent Labs Lab 06/08/14 0500 06/09/14 1135 06/10/14 0430 06/11/14 0400 06/11/14 0603  NA 131* 129* 134* 116* 135*  K 3.0* 2.8* 3.5* 3.0* 3.3*  CL 84* 81* 85* 73* 86*  CO2 32 32 34* 31 37*   GLUCOSE 122* 283* 94 785* 142*  BUN 51* 52* 53* 44* 51*  CREATININE 2.19* 1.90* 2.13* 0.76 1.92*  CALCIUM 9.8 9.2 10.2 8.6 10.5     CBC:  Recent Labs Lab 06/05/14 0510 06/08/14 0500 06/11/14 0400  WBC 5.4 5.3 4.5  HGB 8.5* 8.6* 7.8*  HCT 26.6* 27.3* 25.1*  MCV 74.3* 73.2* 72.8*  PLT 351 331 287    Cardiac Enzymes:  Recent Labs Lab 06/04/14 1430 06/04/14 2338  TROPONINI <0.30 <0.30    BNP: BNP (last 3 results)  Recent Labs  05/15/14 1200 05/23/14 0600 06/04/14 0010  PROBNP 2056.0* 1770.0* 1808.0*     Other results:    Imaging: No results found.   Medications:     Scheduled Medications: . amiodarone  200 mg Oral BID  . aspirin EC  325 mg Oral Daily  . atorvastatin  80 mg Oral Daily  . carvedilol  3.125 mg Oral BID WC  . enoxaparin (LOVENOX) injection  30 mg Subcutaneous Q24H  . hydrALAZINE  25 mg Oral 3 times per day  . insulin aspart  0-9 Units Subcutaneous 6 times per day  . insulin glargine  15 Units Subcutaneous BID  . isosorbide mononitrate  60 mg Oral Daily  . loratadine  10 mg Oral Daily  .  magnesium oxide  400 mg Oral BID  . pantoprazole  40 mg Oral Daily  . potassium chloride  40 mEq Oral TID  . senna-docusate  1 tablet Oral BID  . sodium chloride  10-40 mL Intracatheter Q12H  . spironolactone  25 mg Oral Daily    Infusions: . DOPamine 3 mcg/kg/min (06/11/14 0800)  . furosemide (LASIX) infusion 30 mg/hr (06/11/14 0800)  . milrinone 0.375 mcg/kg/min (06/11/14 0800)    PRN Medications: sodium chloride, albuterol, oxyCODONE-acetaminophen, sodium chloride   Assessment:  1) A/C Systolic HF  2) NICM  - EF 30-35% (03/2014)  3) CP  4) HTN  5) DM2  6) OSA  7) CKD, stage III  8) Hypokalemia  9) Anemia   Plan/Discussion:    CVP improving with dopamine and high-dose lasix gtt (24 this AM). I/O -[id number]. Weight 353 (feels best 345-348). Will continue current regimen today. Watch renal function. Supplement potassium.     Ripley FraiseBrian  Crenshaw,MD 9:48 AM

## 2014-06-11 NOTE — Progress Notes (Signed)
Still refusing lovenox, explained benefits of receiving medication but refused.  Continue to watch.

## 2014-06-11 NOTE — Progress Notes (Signed)
Pt not ready for Bipap at this time.

## 2014-06-11 NOTE — Progress Notes (Signed)
Error Brian Crenshaw   

## 2014-06-11 NOTE — Progress Notes (Signed)
[facility] PROGRESS NOTE Interim History: 38 y.o.male with past medical history outlined below, admitted with fatigue, shortness of breath associated with generalized chest discomfort and fullness, weights over the last 3 or 4 days has been fluctuating, Weight reported 358 lbs in CHF clinic May 26 2014, recorded at 361 lbs on admission 6.14.2015. When he was last seen on June 5, Bumex was increased to 5 mg twice daily. Patient states that his lowest weight home since that time was 350 pounds.   Filed Weights   06/09/14 0500 06/10/14 0411 06/11/14 0304  Weight: 151 kg (332 lb 14.3 oz) 158.7 kg (349 lb 13.9 oz) 160.3 kg (353 lb 6.4 oz)        Intake/Output Summary (Last 24 hours) at 06/11/14 1059 Last data filed at 06/11/14 0800  Gross per 24 hour  Intake 1244.8 ml  Output   5950 ml  Net -4705.2 ml     Assessment/Plan:   NICM (nonischemic cardiomyopathy)/Acute on chronic systolic and diastolic heart failure, NYHA class 4: -  Repeat 2-d echo with EF of 15% with diffuse hypokinesis - On lasix ggt and milrinone/dopamine per CHF service - poor compliance with diet/fluid restriction  - monitor electrolytes, replete - Weight was improving, plateaued now - I/Os negative 11.6L  CKD (chronic kidney disease), stage III - stable - back at [facility]  Microcytic anemia: - likely due to CKD - baseline around 8.5-9 - transfuse if <7, will likely need Epo - start ferrous sulfate at discharge. Iron 20, ferritin 55, B12 700, folate > 9.0.  Elevated d-dimer - V/q scan undeterminate - doppler lower ext negative for DVT -do not suspect VTE at this time  Diabetes mellitus - improved, continue  lantus - hbaic 7.6  Hypokalemia - Replete, recheck in am. Pt's is on aldactone. - continue KCL TID -skipped dose yesterday  HTN (hypertension) - good controlled on current regimen.  Sleep apnea- he reports compliance with Bi Pap - cont bipap.   Morbid obesity with BMI of  50.0-59.9, adult   Code Status: FULL CODE  Family Communication: Wife at Bedside  Disposition Plan: home per Cards    Consultants:  cardiology Advance heart failure team  Procedures: ECHO 6.15.2015: estimated ejection fraction was 15%. Diffuse hypokinesis. grade 2 diastolic dysfunction- Left atrium: The atrium was moderately dilated.- Right ventricle: The cavity size was moderately dilated. Systolic function was moderately reduced. Right atrium: The atrium was moderately dilated. Pulmonary arteries: PA peak pressure: 43 mm Hg (S).  V/q scan 4.15.2015: in determine. doppler DVT 6.15.2015: negative.  Antibiotics:  None  HPI/Subjective: Relates SOB is  Much improved. Ambulating better, walked a lot yesterday, curious abt discharge Objective: Filed Vitals:   06/11/14 0304 06/11/14 0729 06/11/14 0730 06/11/14 0731  BP: 108/66 123/95  123/95  Pulse: 85 85    Temp: 98 F (36.7 C) 97.7 F (36.5 C)    TempSrc: Axillary Oral    Resp: 17 17 16 19   Height:      Weight: 160.3 kg (353 lb 6.4 oz)     SpO2: 99% 99%       Exam:  General: in no acute distress. morbidly obese HEENT: No bruits, no goiter.  Heart: Regular rate and rhythm,  Lungs: Good air movement, clear distant. Abdomen: Soft, nontender, nondistended, positive bowel sounds.  EXT: brawny edema   Data Reviewed: Basic Metabolic Panel:  Recent Labs Lab 06/08/14 0500 06/09/14 1135 06/10/14 0430 06/11/14 0400 06/11/14 0603  NA 131* 129* 134* 116* 135*  K 3.0* 2.8* 3.5* 3.0* 3.3*  CL 84* 81* 85* 73* 86*  CO2 32 32 34* 31 37*  GLUCOSE 122* 283* 94 785* 142*  BUN 51* 52* 53* 44* 51*  CREATININE 2.19* 1.90* 2.13* 0.76 1.92*  CALCIUM 9.8 9.2 10.2 8.6 10.5   Liver Function Tests: No results found for this basename: AST, ALT, ALKPHOS, BILITOT, PROT, ALBUMIN,  in the last 168 hours No results found for this basename: LIPASE, AMYLASE,  in the last 168 hours No results found for this basename: AMMONIA,  in the  last 168 hours CBC:  Recent Labs Lab 06/05/14 0510 06/08/14 0500 06/11/14 0400  WBC 5.4 5.3 4.5  HGB 8.5* 8.6* 7.8*  HCT 26.6* 27.3* 25.1*  MCV 74.3* 73.2* 72.8*  PLT 351 331 287   Cardiac Enzymes:  Recent Labs Lab 06/04/14 1430 06/04/14 2338  TROPONINI <0.30 <0.30   BNP (last 3 results)  Recent Labs  05/15/14 1200 05/23/14 0600 06/04/14 0010  PROBNP 2056.0* 1770.0* 1808.0*   CBG:  Recent Labs Lab 06/10/14 0438 06/10/14 0727 06/10/14 1134 06/10/14 1542 06/10/14 2305  GLUCAP 97 95 151* 169* 190*    Recent Results (from the past 240 hour(s))  MRSA PCR SCREENING     Status: None   Collection Time    06/04/14  6:59 AM      Result Value Ref Range Status   MRSA by PCR NEGATIVE  NEGATIVE Final   Comment:            The GeneXpert MRSA Assay (FDA     approved for NASAL specimens     only), is one component of a     comprehensive MRSA colonization     surveillance program. It is not     intended to diagnose MRSA     infection nor to guide or     monitor treatment for     MRSA infections.  URINE CULTURE     Status: None   Collection Time    06/04/14  1:30 PM      Result Value Ref Range Status   Specimen Description URINE, CLEAN CATCH   Final   Special Requests NONE   Final   Culture  Setup Time     Final   Value: 06/04/2014 21:36     Performed at [facility] Count     Final   Value: 60,000 COLONIES/ML     Performed at [facility]   Culture     Final   Value: Multiple bacterial morphotypes present, none predominant. Suggest appropriate recollection if clinically indicated.     Performed at [facility]   Report Status 06/05/2014 FINAL   Final     Studies: No results found.  Scheduled Meds: . amiodarone  200 mg Oral BID  . aspirin EC  325 mg Oral Daily  . atorvastatin  80 mg Oral Daily  . carvedilol  3.125 mg Oral BID WC  . [START ON 06/12/2014] enoxaparin (LOVENOX) injection  80 mg Subcutaneous Q24H  .  hydrALAZINE  25 mg Oral 3 times per day  . insulin aspart  0-9 Units Subcutaneous 6 times per day  . insulin glargine  15 Units Subcutaneous BID  . isosorbide mononitrate  60 mg Oral Daily  . loratadine  10 mg Oral Daily  . magnesium oxide  400 mg Oral BID  . pantoprazole  40 mg Oral Daily  . potassium chloride  40 mEq Oral TID  .  senna-docusate  1 tablet Oral BID  . sodium chloride  10-40 mL Intracatheter Q12H  . spironolactone  25 mg Oral Daily   Continuous Infusions: . DOPamine 3 mcg/kg/min (06/11/14 0800)  . furosemide (LASIX) infusion 30 mg/hr (06/11/14 0800)  . milrinone 0.375 mcg/kg/min (06/11/14 0800)     JOSEPH,PREETHA  [facility] Pager [phone] If 8PM-8AM, please contact night-coverage at [email or web address], password [email or web address] 06/11/2014, 10:59 AM  LOS: 8 days     **Disclaimer: This note may have been dictated with voice recognition software. Similar sounding words can inadvertently be transcribed and this note may contain transcription errors which may not have been corrected upon publication of note.**

## 2014-06-12 ENCOUNTER — Ambulatory Visit: Payer: Medicare Other | Admitting: Internal Medicine

## 2014-06-12 LAB — BASIC METABOLIC PANEL
BUN: 46 mg/dL — ABNORMAL HIGH (ref 6–23)
CO2: 39 meq/L — AB (ref 19–32)
CREATININE: 1.95 mg/dL — AB (ref 0.50–1.35)
Calcium: 10.6 mg/dL — ABNORMAL HIGH (ref 8.4–10.5)
Chloride: 85 mEq/L — ABNORMAL LOW (ref 96–112)
GFR calc Af Amer: 50 mL/min — ABNORMAL LOW (ref >90)
GFR calc non Af Amer: 43 mL/min — ABNORMAL LOW (ref >90)
Glucose, Bld: 131 mg/dL — ABNORMAL HIGH (ref 70–99)
Potassium: 3.4 mEq/L — ABNORMAL LOW (ref 3.7–5.3)
Sodium: 134 mEq/L — ABNORMAL LOW (ref 137–147)

## 2014-06-12 LAB — CBC
HEMATOCRIT: 28.5 % — AB (ref 39.0–52.0)
Hemoglobin: 8.9 g/dL — ABNORMAL LOW (ref 13.0–17.0)
MCH: 22.6 pg — AB (ref 26.0–34.0)
MCHC: 31.2 g/dL (ref 30.0–36.0)
MCV: 72.3 fL — AB (ref 78.0–100.0)
PLATELETS: 323 10*3/uL (ref 150–400)
RBC: 3.94 MIL/uL — ABNORMAL LOW (ref 4.22–5.81)
RDW: 17.4 % — AB (ref 11.5–15.5)
WBC: 5 10*3/uL (ref 4.0–10.5)

## 2014-06-12 LAB — GLUCOSE, CAPILLARY
Glucose-Capillary: 208 mg/dL — ABNORMAL HIGH (ref 70–99)
Glucose-Capillary: 167 mg/dL — ABNORMAL HIGH (ref 70–99)

## 2014-06-12 LAB — CARBOXYHEMOGLOBIN
Carboxyhemoglobin: 1.8 % — ABNORMAL HIGH (ref 0.5–1.5)
METHEMOGLOBIN: 1.1 % (ref 0.0–1.5)
O2 Saturation: 50.3 %
TOTAL HEMOGLOBIN: 8.8 g/dL — AB (ref 13.5–18.0)

## 2014-06-12 MED ORDER — BISACODYL 10 MG RE SUPP
10.0000 mg | Freq: Once | RECTAL | Status: AC
  Administered 2014-06-12: 10 mg via RECTAL
  Filled 2014-06-12: qty 1

## 2014-06-12 NOTE — Progress Notes (Signed)
UR Completed Lexys Milliner Graves-Bigelow, RN,BSN [phone]  

## 2014-06-12 NOTE — Progress Notes (Signed)
Pt declines walking. Sts he is trying to take a nap. Encouraged pt to walk later this afternoon. Pt also sts that he will not need any heart failure ed. Will f/u tomorrow. Wife in process of getting scooter for pt. Ethelda Chick CES, ACSM 3:26 PM 06/12/2014

## 2014-06-12 NOTE — Progress Notes (Signed)
[facility]  Patient Status: Active [facility] home care/pharmacy pt prior to this admission  [facility] is providing the following services:   [facility], CSW, [facility] for homeo Milrinone. [facility] hospital team will follow and support transition home when deemed appropriate by MD.  If patient discharges after hours, please call [phone].   [name] 06/12/2014, 5:45 PM

## 2014-06-12 NOTE — Progress Notes (Signed)
[facility] PROGRESS NOTE Interim History: 38 y.o.male with past medical history outlined below, admitted with fatigue, shortness of breath associated with generalized chest discomfort and fullness, weights over the last 3 or 4 days has been fluctuating, Weight reported 358 lbs in CHF clinic May 26 2014, recorded at 361 lbs on admission 6.14.2015. When he was last seen on June 5, Bumex was increased to 5 mg twice daily. Patient states that his lowest weight home since that time was 350 pounds.   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 Iron 20, ferritin 55, B12 700, folate > 9.0.  Elevated d-dimer - V/q scan undeterminate - doppler lower ext negative for DVT -do not suspect VTE at this time  Diabetes mellitus - improved, continue  lantus - hbaic 7.6  Hypokalemia - Replete, recheck in am. Pt's is on aldactone. - continue KCL TID -skipped dose yesterday  HTN (hypertension) - good controlled on current regimen.  Sleep apnea- he reports compliance with Bi Pap - cont bipap.   Morbid  obesity with BMI of 50.0-59.9, adult   Code Status: FULL CODE  Family Communication: Wife at Bedside  Disposition Plan: home per Cards    Consultants:  cardiology [facility] heart failure team  Procedures: ECHO 6.15.2015: estimated ejection fraction was 15%. Diffuse hypokinesis. grade 2 diastolic dysfunction- Left atrium: The atrium was moderately dilated.- Right ventricle: The cavity size was moderately dilated. Systolic function was moderately reduced. Right atrium: The atrium was moderately dilated. Pulmonary arteries: PA peak pressure: 43 mm Hg (S).  V/q scan 4.15.2015: in determine. doppler DVT 6.15.2015: negative.  Antibiotics:  None  HPI/Subjective: Relates SOB is  Much improved. Ambulating better, walked a lot yesterday,  Hoping to go home soon  Objective: Filed Vitals:   06/11/14 2318 06/12/14 0438 06/12/14 0755 06/12/14 1107  BP: 114/66 103/82 115/69   Pulse: 91 91 83   Temp: 97.8 F (36.6 C) 97.8 F (36.6 C) 97.5 F (36.4 C) 98.4 F (36.9 C)  TempSrc:   Oral Oral  Resp: 22 15 23    Height:      Weight:  153.724 kg (338 lb 14.4 oz)    SpO2: 100% 100% 100%      Exam:  General: in no acute distress. morbidly obese HEENT: No bruits, no goiter.  Heart: Regular rate and rhythm,  Lungs: Good air movement, clear distant. Abdomen: Soft, nontender, nondistended, positive bowel sounds.  EXT: brawny edema   Data Reviewed: Basic Metabolic Panel:  Recent Labs Lab 06/09/14 1135 06/10/14  0430 06/11/14 0400 06/11/14 0603 06/12/14 0445  NA 129* 134* 116* 135* 134*  K 2.8* 3.5* 3.0* 3.3* 3.4*  CL 81* 85* 73* 86* 85*  CO2 32 34* 31 37* 39*  GLUCOSE 283* 94 785* 142* 131*  BUN 52* 53* 44* 51* 46*  CREATININE 1.90* 2.13* 0.76 1.92* 1.95*  CALCIUM 9.2 10.2 8.6 10.5 10.6*   Liver Function Tests: No results found for this basename: AST, ALT, ALKPHOS, BILITOT, PROT, ALBUMIN,  in the last 168 hours No results found for this basename: LIPASE, AMYLASE,  in the  last 168 hours No results found for this basename: AMMONIA,  in the last 168 hours CBC:  Recent Labs Lab 06/08/14 0500 06/11/14 0400 06/12/14 0445  WBC 5.3 4.5 5.0  HGB 8.6* 7.8* 8.9*  HCT 27.3* 25.1* 28.5*  MCV 73.2* 72.8* 72.3*  PLT 331 287 323   Cardiac Enzymes: No results found for this basename: CKTOTAL, CKMB, CKMBINDEX, TROPONINI,  in the last 168 hours BNP (last 3 results)  Recent Labs  05/15/14 1200 05/23/14 0600 06/04/14 0010  PROBNP 2056.0* 1770.0* 1808.0*   CBG:  Recent Labs Lab 06/10/14 1134 06/10/14 1542 06/10/14 2305 06/11/14 1145 06/11/14 2316  GLUCAP 151* 169* 190* 142* 208*    Recent Results (from the past 240 hour(s))  MRSA PCR SCREENING     Status: None   Collection Time    06/04/14  6:59 AM      Result Value Ref Range Status   MRSA by PCR NEGATIVE  NEGATIVE Final   Comment:            The GeneXpert MRSA Assay (FDA     approved for NASAL specimens     only), is one component of a     comprehensive MRSA colonization     surveillance program. It is not     intended to diagnose MRSA     infection nor to guide or     monitor treatment for     MRSA infections.  URINE CULTURE     Status: None   Collection Time    06/04/14  1:30 PM      Result Value Ref Range Status   Specimen Description URINE, CLEAN CATCH   Final   Special Requests NONE   Final   Culture  Setup Time     Final   Value: 06/04/2014 21:36     Performed at [facility] Count     Final   Value: 60,000 COLONIES/ML     Performed at [facility]   Culture     Final   Value: Multiple bacterial morphotypes present, none predominant. Suggest appropriate recollection if clinically indicated.     Performed at [facility]   Report Status 06/05/2014 FINAL   Final     Studies: No results found.  Scheduled Meds: . amiodarone  200 mg Oral BID  . aspirin EC  325 mg Oral Daily  . atorvastatin  80 mg Oral Daily  . bisacodyl  10 mg Rectal Once  .  carvedilol  3.125 mg Oral BID WC  . enoxaparin (LOVENOX) injection  80 mg Subcutaneous Q24H  . hydrALAZINE  25 mg Oral 3 times per day  . insulin aspart  0-9 Units Subcutaneous 6 times per day  . insulin glargine  15 Units Subcutaneous BID  . isosorbide mononitrate  60 mg Oral Daily  . loratadine  10 mg Oral Daily  . magnesium oxide  400 mg Oral BID  . pantoprazole  40 mg Oral Daily  . potassium chloride  40 mEq Oral TID  . senna-docusate  1 tablet Oral BID  . sodium chloride  10-40 mL Intracatheter Q12H  . spironolactone  25 mg Oral Daily   Continuous Infusions: . DOPamine 3 mcg/kg/min (06/12/14 0800)  . furosemide (LASIX) infusion 30 mg/hr (06/12/14 0800)  . milrinone 0.375 mcg/kg/min (06/12/14 0933)     Harith Mccadden  [facility] Pager [phone] If 8PM-8AM, please contact night-coverage at [email or web address], password [facility] 06/12/2014, 11:35 AM  LOS: 9 days     **Disclaimer: This note may have been dictated with voice recognition software. Similar sounding words can inadvertently be transcribed and this note may contain transcription errors which may not have been corrected upon publication of note.**

## 2014-06-12 NOTE — Progress Notes (Signed)
Pt stated that he would place Bipap on hisself when ready.

## 2014-06-12 NOTE — Progress Notes (Addendum)
Subjective:    38 yr old AAM with a history of NICM (nor cors Oct 2014), chronic systolic HF ECHO 4/09811/2015 EF 20% on chronic home milrinone via PICC, morbid obesity, OSA on BIPAP, HTN, CKD uncontrolled DM, and HLD. He has been followed closely in HF clinic chronic home Milrinone 0.375 mcg , recent d/c from hospital on 05/18/2014 .   Prior to admit he says he did not take bumex on Saturday because he had flank pain.   [facility] dyspnea and leg pain. Pro BNP 1808 and CEs negative. VQ indeterminate for PE.    On home Milrinone at 0.375 . Diuresing with Lasix drip at 30 mg per hour and renal dose.  Dopamine added due to sluggish urine output. Brisk diuresis ver the weekend.  Weight down 23 pounds.   Denies SOB.     Objective:   Weight Range:  Vital Signs:   Temp:  [97.5 F (36.4 C)-97.9 F (36.6 C)] 97.8 F (36.6 C) (06/22 0438) Pulse Rate:  [87-91] 91 (06/22 0438) Resp:  [13-24] 15 (06/22 0438) BP: (103-114)/(53-82) 103/82 mmHg (06/22 0438) SpO2:  [91 %-100 %] 100 % (06/22 0438) Weight:  [338 lb 14.4 oz (153.724 kg)] 338 lb 14.4 oz (153.724 kg) (06/22 0438) Last BM Date: 06/07/14  Weight change: Filed Weights   06/10/14 0411 06/11/14 0304 06/12/14 0438  Weight: 349 lb 13.9 oz (158.7 kg) 353 lb 6.4 oz (160.3 kg) 338 lb 14.4 oz (153.724 kg)    Intake/Output:   Intake/Output Summary (Last 24 hours) at 06/12/14 0829 Last data filed at 06/12/14 0600  Gross per 24 hour  Intake 2141.4 ml  Output   3400 ml  Net -1258.6 ml     Physical Exam: CVP 20  General: . No resp difficulty. Lying in bed.  HEENT: normal Neck: supple. JVP difficult to assess due to body habitus.  Cor: Regular rate & rhythm. +S3 Lungs: clear Abdomen: obese, soft, nontender, distended. Extremities: 1+ edema Neuro: grossly intact  Telemetry: SR with PVCs  Labs: Basic Metabolic Panel:  Recent Labs Lab 06/09/14 1135 06/10/14 0430 06/11/14 0400 06/11/14 0603 06/12/14 0445   NA 129* 134* 116* 135* 134*  K 2.8* 3.5* 3.0* 3.3* 3.4*  CL 81* 85* 73* 86* 85*  CO2 32 34* 31 37* 39*  GLUCOSE 283* 94 785* 142* 131*  BUN 52* 53* 44* 51* 46*  CREATININE 1.90* 2.13* 0.76 1.92* 1.95*  CALCIUM 9.2 10.2 8.6 10.5 10.6*     CBC:  Recent Labs Lab 06/08/14 0500 06/11/14 0400 06/12/14 0445  WBC 5.3 4.5 5.0  HGB 8.6* 7.8* 8.9*  HCT 27.3* 25.1* 28.5*  MCV 73.2* 72.8* 72.3*  PLT 331 287 323    Cardiac Enzymes: No results found for this basename: CKTOTAL, CKMB, CKMBINDEX, TROPONINI,  in the last 168 hours  BNP: BNP (last 3 results)  Recent Labs  05/15/14 1200 05/23/14 0600 06/04/14 0010  PROBNP 2056.0* 1770.0* 1808.0*     Other results:    Imaging: No results found.   Medications:     Scheduled Medications: . amiodarone  200 mg Oral BID  . aspirin EC  325 mg Oral Daily  . atorvastatin  80 mg Oral Daily  . carvedilol  3.125 mg Oral BID WC  . enoxaparin (LOVENOX) injection  80 mg Subcutaneous Q24H  . hydrALAZINE  25 mg Oral 3 times per day  . insulin aspart  0-9 Units Subcutaneous 6 times per day  . insulin  glargine  15 Units Subcutaneous BID  . isosorbide mononitrate  60 mg Oral Daily  . loratadine  10 mg Oral Daily  . magnesium oxide  400 mg Oral BID  . pantoprazole  40 mg Oral Daily  . potassium chloride  40 mEq Oral TID  . senna-docusate  1 tablet Oral BID  . sodium chloride  10-40 mL Intracatheter Q12H  . spironolactone  25 mg Oral Daily    Infusions: . DOPamine 3 mcg/kg/min (06/12/14 0600)  . furosemide (LASIX) infusion 30 mg/hr (06/12/14 0634)  . milrinone 0.375 mcg/kg/min (06/12/14 0600)    PRN Medications: sodium chloride, albuterol, oxyCODONE-acetaminophen, sodium chloride   Assessment:  1) A/C Systolic HF  2) NICM  - EF 30-35% (03/2014)  3) CP  4) HTN  5) DM2  6) OSA  7) CKD, stage III  8) Hypokalemia  9) Anemia   Plan/Discussion:    CVP trending down to 20. Marland Kitchen Continue lasix drip at 30 mg per hour,  Milrinone 0.375 mcg, and Dopamine at 3 mcg.  I/O -1.5  Weight down 23 pounds.  Will continue current regimen today. Watch renal function. Supplement potassium.   Mobilize today.     CLEGG,AMY, NP_C 8:29 AM  Patient seen with NP, agree with the above note.  CVP around 20 now.  Weight continues to go down.  I would continue current regimen at least another day.  Renal function stable.  He is very eager to go home.   Marca Ancona 06/12/2014 8:49 AM

## 2014-06-12 NOTE — Progress Notes (Signed)
06-12-14 @ 1052  Medicare IM signed by patient and signed copy left on chart. Lamar Laundry Anton Ruiz, RN,BSN [phone]

## 2014-06-12 NOTE — Progress Notes (Signed)
Pt stated he was not ready to go on BIPAP at this time but said he is able to place self on and off BiPAP. RT made pt aware that if he had any problems or questions to call. RT will continue to monitor.

## 2014-06-13 DIAGNOSIS — I5041 Acute combined systolic (congestive) and diastolic (congestive) heart failure: Secondary | ICD-10-CM

## 2014-06-13 DIAGNOSIS — Z6841 Body Mass Index (BMI) 40.0 and over, adult: Secondary | ICD-10-CM

## 2014-06-13 LAB — CARBOXYHEMOGLOBIN
CARBOXYHEMOGLOBIN: 1.7 % — AB (ref 0.5–1.5)
Methemoglobin: 1.1 % (ref 0.0–1.5)
O2 SAT: 48.3 %
Total hemoglobin: 9.4 g/dL — ABNORMAL LOW (ref 13.5–18.0)

## 2014-06-13 LAB — BASIC METABOLIC PANEL
BUN: 41 mg/dL — AB (ref 6–23)
BUN: 41 mg/dL — AB (ref 6–23)
CHLORIDE: 85 meq/L — AB (ref 96–112)
CHLORIDE: 87 meq/L — AB (ref 96–112)
CO2: 36 mEq/L — ABNORMAL HIGH (ref 19–32)
CO2: 35 mEq/L — ABNORMAL HIGH (ref 19–32)
Calcium: 10 mg/dL (ref 8.4–10.5)
Calcium: 9.9 mg/dL (ref 8.4–10.5)
Creatinine, Ser: 1.34 mg/dL (ref 0.50–1.35)
Creatinine, Ser: 1.98 mg/dL — ABNORMAL HIGH (ref 0.50–1.35)
GFR calc Af Amer: 49 mL/min — ABNORMAL LOW (ref >90)
GFR calc non Af Amer: 42 mL/min — ABNORMAL LOW (ref >90)
GFR, EST AFRICAN AMERICAN: 78 mL/min — AB (ref >90)
GFR, EST NON AFRICAN AMERICAN: 67 mL/min — AB (ref >90)
GLUCOSE: 129 mg/dL — AB (ref 70–99)
Glucose, Bld: 151 mg/dL — ABNORMAL HIGH (ref 70–99)
POTASSIUM: 4.4 meq/L (ref 3.7–5.3)
Potassium: 4.6 mEq/L (ref 3.7–5.3)
Sodium: 133 mEq/L — ABNORMAL LOW (ref 137–147)
Sodium: 135 mEq/L — ABNORMAL LOW (ref 137–147)

## 2014-06-13 LAB — GLUCOSE, CAPILLARY: Glucose-Capillary: 173 mg/dL — ABNORMAL HIGH (ref 70–99)

## 2014-06-13 MED ORDER — INSULIN GLARGINE 100 UNIT/ML ~~LOC~~ SOLN: 20.0000 [IU] | Freq: Every day | SUBCUTANEOUS | Status: DC

## 2014-06-13 MED ORDER — METOLAZONE 2.5 MG PO TABS: 2.5000 mg | ORAL_TABLET | ORAL | Status: AC

## 2014-06-13 MED ORDER — SPIRONOLACTONE 25 MG PO TABS: 25.0000 mg | ORAL_TABLET | Freq: Every day | ORAL | Status: DC

## 2014-06-13 MED ORDER — INSULIN GLARGINE 100 UNIT/ML ~~LOC~~ SOLN: 20.0000 [IU] | Freq: Every day | SUBCUTANEOUS | Status: AC

## 2014-06-13 MED ORDER — METOLAZONE 2.5 MG PO TABS
2.5000 mg | ORAL_TABLET | ORAL | Status: DC
  Administered 2014-06-13: 2.5 mg via ORAL
  Filled 2014-06-13: qty 1

## 2014-06-13 NOTE — Discharge Summary (Signed)
Physician Discharge Summary  [facility] Montez Hageman. WUX:[id number] DOB: 01/19/1976 DOA: 06/03/2014  PCP: Oliver Barre, MD  Admit date: 06/03/2014 Discharge date: 06/13/2014  Time spent: 50 minutes  Recommendations for Outpatient Follow-up:  1. Heart failure clinic 6/29, Needs repeat Bmet then 2. Dr.Klein 6.25 for CRT consideration 3. 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%   Normal coronary arteries- last cath Oct 2014 at [facility]   Morbid obesity with BMI of 50.0-59.9, adult   Sleep apnea- he reports comliance with Bi Pap   HTN (hypertension)   Dyslipidemia   Microcytic anemia   Hypokalemia   CKD (chronic kidney disease), stage III   Diabetes mellitus   Chest pain   Elevated d-dimer   Discharge Condition: stable  Diet recommendation:low sodium, heart healthy  Filed Weights   06/11/14 0304 06/12/14 0438 06/13/14 0522  Weight: 160.3 kg (353 lb 6.4 oz) 153.724 kg (338 lb 14.4 oz) 149.914 kg (330 lb 8 oz)    History of present illness:  Chief Complaint: Chest Pain SOB, and Pain in Both Legs  HPI: Steven Wyatt. is a 38 y.o. male with a history of Chronic Systolic CHF NYHA Class IV on a continuous Milrinone Drip, DM2, HTN, and CKD Stage III who presents to the ED with complaints of sudden onset of worsening of his SOB with DOE, and chest pain this evening. He describes the chest discomfort as intermittent and int he mid-chest and a feeling as if his chest was pounding and heavy. He had increased SOB wth the episodes. He reports that he has had a weight gain of fluid of 15 pounds over the last week. He has also been in the process of having his medications adjusted by the Cardiologist and CHF Clinic. He reports muscle cramping in his legs. He was brought to the ED and was evaluated and found to have a BNP =1808.0, as well as  vascular congestion and cardiomegaly on Chest X-Ray, and Hypokalemia with a K+=2.8, and and elevated D-dimer at 1. 54.   Hospital Course:   NICM (nonischemic cardiomyopathy)/Acute on chronic systolic and diastolic heart failure, NYHA class 4:  - Repeat 2-d echo with EF of 15% with diffuse hypokinesis  - Was On lasix ggt and milrinone/dopamine per CHF service  - poor compliance with diet/fluid restriction at home  - Weight was improving, plateaued now  - I/Os negative 15.1L  - weights down >25lbs, 330Lb at discharge -plan was to start weaning Dopamine today but Patient adamant abt going home today and Cards recommended discharge home on PO Bumex and KCL at home dose and metolazone weekly -Fu on 6/29 at CHF clinic  CKD (chronic kidney disease), stage III  - stable  - back at baseline   Microcytic anemia:  - likely due to CKD and iron defi - baseline around 8.5-9  - transfuse if <7, will likely need Epo  - Iron 20, ferritin 55, B12 700, folate > 9.0.   Elevated d-dimer  - V/q scan undeterminate  - doppler lower ext negative for DVT  -do not suspect VTE at this time   Diabetes mellitus  - improved, continue lantus  - hbaic 7.6  Hypokalemia  - Replete, recheck in am.  -resume KCL at home dose  HTN (hypertension)  - good controlled on current regimen.   Sleep apnea- he reports  compliance with Bi Pap  - cont bipap.   Morbid obesity with BMI of 50.0-59.9, adult     Consultations:  CHF team  Discharge Exam: Filed Vitals:   06/13/14 0801  BP:   Pulse: 92  Temp:   Resp: 11    General: AAOx3 Cardiovascular: S1S2/RRR Respiratory: CTAB  Discharge Instructions You were cared for by a hospitalist during your hospital stay. If you have any questions about your discharge medications or the care you received while you were in the hospital after you are discharged, you can call the unit and asked to speak with the hospitalist on call if the hospitalist that took care of  you is not available. Once you are discharged, your primary care physician will handle any further medical issues. Please note that NO REFILLS for any discharge medications will be authorized once you are discharged, as it is imperative that you return to your primary care physician (or establish a relationship with a primary care physician if you do not have one) for your aftercare needs so that they can reassess your need for medications and monitor your lab values.  Discharge Instructions   Diet - low sodium heart healthy    Complete by:  As directed      Diet Carb Modified    Complete by:  As directed      Increase activity slowly    Complete by:  As directed             Medication List    STOP taking these medications       digoxin 0.125 MG tablet  Commonly known as:  LANOXIN      TAKE these medications       albuterol 108 (90 BASE) MCG/ACT inhaler  Commonly known as:  PROVENTIL HFA;VENTOLIN HFA  Inhale 2 puffs into the lungs daily as needed for wheezing or shortness of breath.     amiodarone 400 MG tablet  Commonly known as:  PACERONE  Take 0.5 tablets (200 mg total) by mouth 2 (two) times daily.     aspirin EC 81 MG tablet  Take 81 mg by mouth daily.     atorvastatin 80 MG tablet  Commonly known as:  LIPITOR  Take 80 mg by mouth daily.     bumetanide 2 MG tablet  Commonly known as:  BUMEX  Take 2.5 tablets (5 mg total) by mouth 2 (two) times daily.     carvedilol 3.125 MG tablet  Commonly known as:  COREG  Take 1 tablet (3.125 mg total) by mouth 2 (two) times daily.     hydrALAZINE 25 MG tablet  Commonly known as:  APRESOLINE  Take 1 tablet (25 mg total) by mouth every 8 (eight) hours.     insulin aspart 100 UNIT/ML FlexPen  Commonly known as:  NOVOLOG FLEXPEN  Inject 1-15 Units into the skin 3 (three) times daily with meals. Sliding scale     insulin glargine 100 UNIT/ML injection  Commonly known as:  LANTUS  Inject 0.2 mLs (20 Units total) into the  skin at bedtime.     isosorbide mononitrate 60 MG 24 hr tablet  Commonly known as:  IMDUR  Take 1 tablet (60 mg total) by mouth daily.     loratadine 10 MG tablet  Commonly known as:  CLARITIN  Take 1 tablet (10 mg total) by mouth daily.     magnesium oxide 400 MG tablet  Commonly known as:  MAG-OX  Take 1 tablet (  400 mg total) by mouth 2 (two) times daily.     metolazone 2.5 MG tablet  Commonly known as:  ZAROXOLYN  Take 1 tablet (2.5 mg total) by mouth once a week.     milrinone 20 MG/100ML Soln infusion  Commonly known as:  PRIMACOR  Inject 62.7375 mcg/min into the vein continuous.     oxyCODONE-acetaminophen 5-325 MG per tablet  Commonly known as:  PERCOCET/ROXICET  Take 1 tablet by mouth every 6 (six) hours as needed for severe pain.     pantoprazole 40 MG tablet  Commonly known as:  PROTONIX  Take 1 tablet (40 mg total) by mouth daily.     potassium chloride SA 20 MEQ tablet  Commonly known as:  K-DUR,KLOR-CON  Take 3 tablets (60 mEq total) by mouth 2 (two) times daily.     promethazine 25 MG tablet  Commonly known as:  PHENERGAN  Take 1 tablet (25 mg total) by mouth every 6 (six) hours as needed for nausea or vomiting.       Allergies  Allergen Reactions  . Iodine Anaphylaxis  . Shellfish Allergy Anaphylaxis       Follow-up Information   Follow up with [facility] On 06/19/2014. (@ 12;15 pm. Gate code 0400. Bring all your medications to your visit. )    Specialty:  Cardiology   Contact information:   [address] [id number] [place] [address] [phone]      Follow up with Sherryl Manges, MD On 06/15/2014. (@ 9:00 am. )    Specialty:  Cardiology   Contact information:   [address] [place] [address] [phone]       Follow up with [facility]. (Registered Nurse and Social Worker)    Solicitor information:   [address] [place]  [address] [phone]        The results of significant diagnostics from this hospitalization (including imaging, microbiology, ancillary and laboratory) are listed below for reference.    Significant Diagnostic Studies: Ct Abdomen Pelvis Wo Contrast  05/23/2014   CLINICAL DATA:  Abdominal pain.  EXAM: CT ABDOMEN AND PELVIS WITHOUT CONTRAST  TECHNIQUE: Multidetector CT imaging of the abdomen and pelvis was performed following the standard protocol without IV contrast.  COMPARISON:  None.  FINDINGS: Evaluation lung bases is degraded by motion artifact. There is diffuse ground-glass density throughout the lung bases well as thickening of the interstitial markings. There are areas demonstrating tree-in-bud configuration with thin the right lung base. Calcified granuloma posterior right lung base. The heart is enlarged.  The liver, spleen, adrenals, pancreas are unremarkable within the limitations of a noncontrast CT. Kidneys unremarkable.  There is no abdominal aortic aneurysm.  Within the mid to lower pelvis in the right pelvic wall region a trace amount of fluid and stranding is appreciated within the fat. Enlarged lymph nodes are identified largest in the external iliac chain image 83 series 2 measuring 1.6 cm in short axis. No loculated fluid collections are appreciated nor evidence of masses. Prominent inguinal lymph nodes are identified. The largest is in the left inguinal region measuring 1.5 cm in short axis.  No abdominal masses, loculated fluid collections nor significant free fluid or adenopathy is appreciated. The bowel is otherwise negative. Minimal diverticulosis within the sigmoid colon.  There is no abdominal wall hernia. A very small fat containing right inguinal hernia is appreciated.  No aggressive appearing osseous lesions.  IMPRESSION:  A nonspecific enlarged lymph nodes within the external iliac chain on the right and prominent mildly enlarged lymph nodes in the inguinal regions left greater  than right. There are mild inflammatory changes within the pelvis. Differential considerations include an indolent or resolving infectious or inflammatory process. Clinical correlation is recommended. More ominous etiologies cannot be excluded.  Findings within the lung bases which may represent an interstitial pneumonitis. A component of pulmonary edema is also diagnostic consideration.  Cardiomegaly   Electronically Signed   By: Salome Holmes M.D.   On: 05/23/2014 15:28   Dg Chest 2 View  05/15/2014   CLINICAL DATA:  CHEST PAIN SHORTNESS OF BREATH  EXAM: CHEST - 2 VIEW  COMPARISON:  the previous day's study  FINDINGS: Right arm PICC line remains with its tip in the mid SVC. Hazy perihilar opacities bilaterally, stable since studies back to 04/22/2014. Moderate cardiomegaly as before. . No effusion. Visualized skeletal structures are unremarkable.  IMPRESSION: Stable cardiomegaly and hazy perihilar opacities.   Electronically Signed   By: Oley Balm M.D.   On: 05/15/2014 11:53   Nm Pulmonary Perfusion  06/04/2014   CLINICAL DATA:  Elevated D-dimer.  High risk for pulmonary embolus.  EXAM: NUCLEAR MEDICINE VENTILATION - PERFUSION LUNG SCAN  TECHNIQUE: Ventilation images could not be obtained due to equipment failure. Perfusion images were obtained in multiple projections after intravenous injection of Tc-62m MAA.  RADIOPHARMACEUTICALS:  6.0 MCi Tc-50m MAA  COMPARISON:  Chest x-ray 06/04/2014.  FINDINGS: Perfusion: Tiny subsegmental defects cannot be excluded. These findings most likely related to the patient's congestive heart failure and pulmonary edema noted on today's chest x-ray .  IMPRESSION: Suboptimal study as described above. This scan is indeterminate for pulmonary embolus. The findings on Perfusion scan are most likely related to congestive heart failure/ pulmonary edema noted on today's chest x-ray.   Electronically Signed   By: Maisie Fus  Register   On: 06/04/2014 13:36   US Abdomen  Limited  05/23/2014   CLINICAL DATA:  Abdominal pain.  Evaluate the gallbladder.  EXAM: US ABDOMEN LIMITED - RIGHT UPPER QUADRANT  COMPARISON:  None.  FINDINGS: Gallbladder:  No gallstones. Apparent wall thickening is likely from underdistention. No focal tenderness.  Common bile duct:  Diameter: 4 mm.  Liver:  No focal lesion identified. Within normal limits in parenchymal echogenicity. Antegrade flow in the imaged portal venous system.  IMPRESSION: Negative right upper quadrant ultrasound.   Electronically Signed   By: Tiburcio Pea M.D.   On: 05/23/2014 06:36   Dg Chest Port 1 View  06/04/2014   CLINICAL DATA:  Chest, abdominal and bilateral leg pain. Shortness of breath.  EXAM: PORTABLE CHEST - 1 VIEW  COMPARISON:  Chest radiograph performed 05/23/2014  FINDINGS: The lungs are well-aerated. Vascular congestion is noted. Mildly increased interstitial markings could reflect minimal interstitial edema. No pleural effusion or pneumothorax is seen.  The cardiomediastinal silhouette is borderline enlarged. No acute osseous abnormalities are seen. A right PICC is noted ending about the proximal to mid SVC.  IMPRESSION: Vascular congestion and borderline cardiomegaly. Mildly increased interstitial markings could reflect minimal interstitial edema.   Electronically Signed   By: Roanna Raider M.D.   On: 06/04/2014 01:28   Dg Chest Port 1 View  05/23/2014   CLINICAL DATA:  Epigastric/chest pain.  EXAM: PORTABLE CHEST - 1 VIEW  COMPARISON:  Chest radiograph May 15, 2014.  FINDINGS: The cardiac silhouette remains mildly enlarged, even with consideration to this low inspiratory portable examination.  Increasing retrocardiac consolidation with central pulmonary vasculature congestion and mild interstitial prominence. Suspected small left pleural effusion though, left lung base incompletely imaged.  Right PICC distal tip projects in proximal superior vena cava. No pneumothorax. Multiple EKG lines overlie the patient and  may obscure subtle underlying pathology. Soft tissue planes and included osseous structures are nonsuspicious.  IMPRESSION: Stable cardiomegaly with interstitial prominence suggesting pulmonary edema, increasing retrocardiac consolidation could reflect atelectasis, confluent edema or even pneumonia with suspected small left pleural effusion.  No apparent change in right PICC.   Electronically Signed   By: Awilda Metro   On: 05/23/2014 05:02    Microbiology: Recent Results (from the past 240 hour(s))  MRSA PCR SCREENING     Status: None   Collection Time    06/04/14  6:59 AM      Result Value Ref Range Status   MRSA by PCR NEGATIVE  NEGATIVE Final   Comment:            The GeneXpert MRSA Assay (FDA     approved for NASAL specimens     only), is one component of a     comprehensive MRSA colonization     surveillance program. It is not     intended to diagnose MRSA     infection nor to guide or     monitor treatment for     MRSA infections.  URINE CULTURE     Status: None   Collection Time    06/04/14  1:30 PM      Result Value Ref Range Status   Specimen Description URINE, CLEAN CATCH   Final   Special Requests NONE   Final   Culture  Setup Time     Final   Value: 06/04/2014 21:36     Performed at [facility] Count     Final   Value: 60,000 COLONIES/ML     Performed at [facility]   Culture     Final   Value: Multiple bacterial morphotypes present, none predominant. Suggest appropriate recollection if clinically indicated.     Performed at [facility]   Report Status 06/05/2014 FINAL   Final     Labs: Basic Metabolic Panel:  Recent Labs Lab 06/11/14 0400 06/11/14 0603 06/12/14 0445 06/13/14 0500 06/13/14 1015  NA 116* 135* 134* 133* 135*  K 3.0* 3.3* 3.4* 4.4 4.6  CL 73* 86* 85* 85* 87*  CO2 31 37* 39* 36* 35*  GLUCOSE 785* 142* 131* 151* 129*  BUN 44* 51* 46* 41* 41*  CREATININE 0.76 1.92* 1.95* 1.34 1.98*  CALCIUM 8.6  10.5 10.6* 10.0 9.9   Liver Function Tests: No results found for this basename: AST, ALT, ALKPHOS, BILITOT, PROT, ALBUMIN,  in the last 168 hours No results found for this basename: LIPASE, AMYLASE,  in the last 168 hours No results found for this basename: AMMONIA,  in the last 168 hours CBC:  Recent Labs Lab 06/08/14 0500 06/11/14 0400 06/12/14 0445  WBC 5.3 4.5 5.0  HGB 8.6* 7.8* 8.9*  HCT 27.3* 25.1* 28.5*  MCV 73.2* 72.8* 72.3*  PLT 331 287 323   Cardiac Enzymes: No results found for this basename: CKTOTAL, CKMB, CKMBINDEX, TROPONINI,  in the last 168 hours BNP: BNP (last 3 results)  Recent Labs  05/15/14 1200 05/23/14 0600 06/04/14 0010  PROBNP 2056.0* 1770.0* 1808.0*   CBG:  Recent Labs Lab 06/10/14 2305 06/11/14 1145 06/11/14 2316 06/12/14 1221 06/13/14 0109  GLUCAP 190* 142* 208* 167* 173*       Signed:  JOSEPH,PREETHA  [facility] 06/13/2014, 11:55 AM

## 2014-06-13 NOTE — Progress Notes (Signed)
Subjective:    38 yr old AAM with a history of NICM (nor cors Oct 2014), chronic systolic HF ECHO 03/4627 EF 20% on chronic home milrinone via PICC, morbid obesity, OSA on BIPAP, HTN, CKD uncontrolled DM, and HLD. He has been followed closely in HF clinic chronic home Milrinone 0.375 mcg , recent d/c from hospital on 05/18/2014 .   Prior to admit he says he did not take bumex on Saturday because he had flank pain.   Readmitted to [facility] with increased dyspnea and leg pain. Pro BNP 1808 and CEs negative. VQ indeterminate for PE.    Diuresing with Lasix drip at 30 mg per hour, renal dose dopamine and milrinone 0.375. Weight down 8 lbs, however probably not accurate with 24 hr I/O - 1 liters. Refuses to walk. [name] 80-100s. Cr much improved to 1.34 not sure if accurate.   Denies SOB.   Co-ox 48% Objective:   Weight Range:  Vital Signs:   Temp:  [96 F (35.6 C)-98.4 F (36.9 C)] 97.8 F (36.6 C) (06/23 0757) Pulse Rate:  [80-92] 92 (06/23 0523) Resp:  [16-20] 18 (06/23 0523) BP: (89-109)/(41-73) 106/62 mmHg (06/23 0523) SpO2:  [92 %-100 %] 92 % (06/23 0523) Weight:  [330 lb 8 oz (149.914 kg)] 330 lb 8 oz (149.914 kg) (06/23 0522) Last BM Date: 06/11/14 (per pt statement)  Weight change: Filed Weights   06/11/14 0304 06/12/14 0438 06/13/14 0522  Weight: 353 lb 6.4 oz (160.3 kg) 338 lb 14.4 oz (153.724 kg) 330 lb 8 oz (149.914 kg)    Intake/Output:   Intake/Output Summary (Last 24 hours) at 06/13/14 0803 Last data filed at 06/13/14 0700  Gross per 24 hour  Intake 1898.8 ml  Output   2650 ml  Net -751.2 ml     Physical Exam: CVP 22 General: . No resp difficulty. Lying in bed. CPAP on HEENT: normal Neck: supple. JVP difficult to assess due to body habitus but remains elevated Cor: Regular rate & rhythm. +S3 Lungs: clear Abdomen: obese, soft, nontender, distended. Extremities: 1+ edema Neuro: grossly intact  Telemetry: SR with PVCs  Labs: Basic Metabolic  Panel:  Recent Labs Lab 06/10/14 0430 06/11/14 0400 06/11/14 0603 06/12/14 0445 06/13/14 0500  NA 134* 116* 135* 134* 133*  K 3.5* 3.0* 3.3* 3.4* 4.4  CL 85* 73* 86* 85* 85*  CO2 34* 31 37* 39* 36*  GLUCOSE 94 785* 142* 131* 151*  BUN 53* 44* 51* 46* 41*  CREATININE 2.13* 0.76 1.92* 1.95* 1.34  CALCIUM 10.2 8.6 10.5 10.6* 10.0     CBC:  Recent Labs Lab 06/08/14 0500 06/11/14 0400 06/12/14 0445  WBC 5.3 4.5 5.0  HGB 8.6* 7.8* 8.9*  HCT 27.3* 25.1* 28.5*  MCV 73.2* 72.8* 72.3*  PLT 331 287 323    Cardiac Enzymes: No results found for this basename: CKTOTAL, CKMB, CKMBINDEX, TROPONINI,  in the last 168 hours  BNP: BNP (last 3 results)  Recent Labs  05/15/14 1200 05/23/14 0600 06/04/14 0010  PROBNP 2056.0* 1770.0* 1808.0*     Other results:    Imaging: No results found.   Medications:     Scheduled Medications: . amiodarone  200 mg Oral BID  . aspirin EC  325 mg Oral Daily  . atorvastatin  80 mg Oral Daily  . carvedilol  3.125 mg Oral BID WC  . enoxaparin (LOVENOX) injection  80 mg Subcutaneous Q24H  . hydrALAZINE  25 mg Oral 3 times per day  .  insulin aspart  0-9 Units Subcutaneous 6 times per day  . insulin glargine  15 Units Subcutaneous BID  . isosorbide mononitrate  60 mg Oral Daily  . loratadine  10 mg Oral Daily  . magnesium oxide  400 mg Oral BID  . pantoprazole  40 mg Oral Daily  . potassium chloride  40 mEq Oral TID  . senna-docusate  1 tablet Oral BID  . sodium chloride  10-40 mL Intracatheter Q12H  . spironolactone  25 mg Oral Daily    Infusions: . DOPamine 3 mcg/kg/min (06/13/14 0700)  . furosemide (LASIX) infusion 30 mg/hr (06/13/14 0700)  . milrinone 0.375 mcg/kg/min (06/13/14 0700)    PRN Medications: sodium chloride, albuterol, oxyCODONE-acetaminophen, sodium chloride   Assessment:  1) A/C Systolic HF  2) NICM  - EF 30-35% (03/2014)  3) CP  4) HTN  5) DM2  6) OSA  7) CKD, stage III  8) Hypokalemia  9)  Anemia   Plan/Discussion:    Volume status continues to improve, however remains volume overloaded. Weight down 8 lbs but likely not accurate with 24 hr I/O -1 liters. CVP remains 20-22, however patient is adamant about going home. Will stop dopamine and continue milrinone 0.375, co-ox 48%.  Continue Bumex at prior home dose (was not taking regularly) but will add metolazone 2.5 mg once weekly. Creatinine much improved will recheck before going home not sure if accurate. Will send home today and follow up in HF clinic next week. Continue AHC.    Steven Wyatt, Ali B, NP_C 8:03 AM  Patient seen with NP, agree with the above note.  Patient has diuresed well and has lost a lot of weight.  He feels good and is walking without problems.  Co-ox remains low.  He insists on going home for social issues today.  Therefore, I am going to stop his dopamine and send him home on his prior home milrinone dose.  Will continue other home meds including prior Bumex and potassium (was not taking regularly).  I will have him take metolazone 2.5 mg once a week. He will need followup next week with BMET.  Will need to reschedule appt with Dr. Graciela HusbandsKlein for CRT consideration.   Marca AnconaDalton Ulrick Methot 06/13/2014 8:41 AM

## 2014-06-13 NOTE — Progress Notes (Addendum)
Pt allowed some ed review this am. Teach back done, pt knows re daily weights, 2000 mg sodium, walking, avoiding heat. Pt declined handouts as he sts he has them at home. Wife present and reiterates this. Pt sts this admission was not his fault and was due to med changes. Offered CRPII as wife is in process of pursuing CRPII for herself. Pt sts he is absolutely not interested, that he gets enough ex with his 5 kids. It appears that pt ate grits with numerous sugar packets after I left.  [id number] Ethelda Chick CES, ACSM 10:01 AM 06/13/2014

## 2014-06-13 NOTE — Progress Notes (Signed)
Discharge instructions given to patient with teach-back.  No questions asked.  Voucher for taxi givien to wife, patient states he will let me know when I can call taxi.  Wife packing belongings,  patient in bed and went back to sleep.  Continue to monitor.

## 2014-06-13 NOTE — Progress Notes (Signed)
Still refusing lovenox.  Continue to watch.

## 2014-06-14 ENCOUNTER — Encounter (HOSPITAL_COMMUNITY): Payer: Medicare Other

## 2014-06-14 ENCOUNTER — Ambulatory Visit (HOSPITAL_COMMUNITY): Payer: Medicare Other

## 2014-06-14 ENCOUNTER — Encounter: Payer: Self-pay | Admitting: Internal Medicine

## 2014-06-15 ENCOUNTER — Telehealth: Payer: Self-pay | Admitting: Internal Medicine

## 2014-06-15 ENCOUNTER — Ambulatory Visit (INDEPENDENT_AMBULATORY_CARE_PROVIDER_SITE_OTHER): Payer: Medicare Other | Admitting: Internal Medicine

## 2014-06-15 ENCOUNTER — Encounter: Payer: Self-pay | Admitting: Internal Medicine

## 2014-06-15 VITALS — BP 113/77 | HR 102 | Ht 68.5 in | Wt 334.0 lb

## 2014-06-15 DIAGNOSIS — I428 Other cardiomyopathies: Secondary | ICD-10-CM

## 2014-06-15 NOTE — Progress Notes (Signed)
    Patient Care Team: James W John, MD as PCP - General (Internal Medicine)   HPI  Steven O Tieken Jr. is a 37 y.o. male Seen in followup for consideration of CRT  He has NICM and LBBB and on home milrinone  He had freq PVCs  Holter>>8%;   amiodarone was initiated.  Currently not felt to be a candidate for therapy because of noncompliance and obesity  He was prescribed a LifeVest. Compliance was a question.  An  echo 3/15 was 35% but felt to be poorly visualizing; he underwent repeat echo with contrast  6/15 which demonstrated an ejection fraction of 15% with significant chamber enlargement.  He was recently hospitalized for heart failure and underwent a diuresis of 25 pounds. -15 L.  Her most recent ECG demonstrates a nonspecific IVCD with a QRS duration 134 ms    Past Medical History  Diagnosis Date  . Chronic systolic CHF (congestive heart failure)     On home milrinone - dose titrate to 0.375mcg/kg/min 03/2014.  . Essential hypertension, benign   . Morbid obesity with BMI of 50.0-59.9, adult   . Dyslipidemia   . CKD (chronic kidney disease), stage III   . Asthma   . Microcytic anemia   . NICM (nonischemic cardiomyopathy)   . History of left heart catheterization     Normal coronaries October 2014  . History of chicken pox   . Perirectal abscess 03/2014  . Allergic rhinitis 04/29/2014  . Anxiety   . Chronic bronchitis   . Sleep apnea     BIPAP  . Prediabetes   . DKA (diabetic ketoacidoses) 02/25/2014    Past Surgical History  Procedure Laterality Date  . Incision and drainage abscess N/A 03/31/2014    Procedure: INCISION AND DRAINAGE  PERI-RECTAL ABSCESS;  Surgeon: Armando Ramirez, MD;  Location: [facility] OR;  Service: General;  Laterality: N/A;  . Cardiac catheterization  October 2014    [facility]; reportedly clean, no further details available    Current Outpatient Prescriptions  Medication Sig Dispense Refill  . albuterol (PROVENTIL  HFA;VENTOLIN HFA) 108 (90 BASE) MCG/ACT inhaler Inhale 2 puffs into the lungs daily as needed for wheezing or shortness of breath.      . amiodarone (PACERONE) 400 MG tablet Take 0.5 tablets (200 mg total) by mouth 2 (two) times daily.  60 tablet  2  . aspirin EC 81 MG tablet Take 81 mg by mouth daily.      . atorvastatin (LIPITOR) 80 MG tablet Take 80 mg by mouth daily.      . bumetanide (BUMEX) 2 MG tablet Take 2.5 tablets (5 mg total) by mouth 2 (two) times daily.  120 tablet  6  . carvedilol (COREG) 3.125 MG tablet Take 1 tablet (3.125 mg total) by mouth 2 (two) times daily.  60 tablet  3  . hydrALAZINE (APRESOLINE) 25 MG tablet Take 1 tablet (25 mg total) by mouth every 8 (eight) hours.  90 tablet  6  . insulin aspart (NOVOLOG FLEXPEN) 100 UNIT/ML FlexPen Inject 1-15 Units into the skin 3 (three) times daily with meals. Sliding scale  15 mL  11  . insulin glargine (LANTUS) 100 UNIT/ML injection Inject 0.2 mLs (20 Units total) into the skin at bedtime.      . isosorbide mononitrate (IMDUR) 60 MG 24 hr tablet Take 1 tablet (60 mg total) by mouth daily.  30 tablet  3  . loratadine (CLARITIN) 10 MG tablet Take 10   mg by mouth daily as needed.      . magnesium oxide (MAG-OX) 400 MG tablet Take 1 tablet (400 mg total) by mouth 2 (two) times daily.  60 tablet  3  . metolazone (ZAROXOLYN) 2.5 MG tablet Take 1 tablet (2.5 mg total) by mouth once a week.  6 tablet  0  . milrinone (PRIMACOR) 20 MG/100ML SOLN infusion Inject 62.7375 mcg/min into the vein continuous.  100 mL  6  . oxyCODONE-acetaminophen (PERCOCET/ROXICET) 5-325 MG per tablet Take 1 tablet by mouth every 6 (six) hours as needed for severe pain.  15 tablet  0  . pantoprazole (PROTONIX) 40 MG tablet Take 1 tablet (40 mg total) by mouth daily.  30 tablet  3  . potassium chloride SA (K-DUR,KLOR-CON) 20 MEQ tablet Take 3 tablets (60 mEq total) by mouth 2 (two) times daily.  120 tablet  3  . promethazine (PHENERGAN) 25 MG tablet Take 1 tablet (25  mg total) by mouth every 6 (six) hours as needed for nausea or vomiting.  10 tablet  0   No current facility-administered medications for this visit.    Allergies  Allergen Reactions  . Iodine Anaphylaxis  . Shellfish Allergy Anaphylaxis    Review of Systems negative except from HPI and PMH  Physical Exam BP 113/77  Pulse 102  Ht 5' 8.5" (1.74 m)  Wt 334 lb (151.501 kg)  BMI 50.04 kg/m2 Well developed and well nourished in no acute distress HENT normal E scleral and icterus clear Neck Supple JVP flat; carotids brisk and full Clear to ausculation  Regular rate and rhythm, no murmurs gallops or rub Soft with active bowel sounds No clubbing cyanosis trace brawny  Edema Alert and oriented, grossly normal motor and sensory function Skin Warm and Dry  ECG demonstrates sinus rhythm at 102 Intervals 20/13/or his 7 Axis rightward at 156 There are no monophasic R waves    Assessment and  Plan  Left bundle branch block probably rate related  Nonischemic cardiomyopathy EF 15% echo 6/15  Congestive heart failure- dependent milrinone  Nonspecific IVCD  PVCs with a meal he or so of the  Noncompliance-improving  LifeVest  The patient has a nonspecific IVCD rates below 107. (See ECG 04/11/14)  QRS duration is also the 135 range. This makes CRT a less optimal option especially given things like right ventricular failure.  congestive heart failure status he is currently euvolemic  Given his milrinone dependence I do not think he would be a candidate for   ivabradine or uptitrartion of his betablocker  In the absence of destination therapy I am not sure there is a role for ICD   Will discuss with Dr DM   We have also had a discussion about the importance of LifeVest and compliance if it is his desire that it be available in the event of a cardiac arrest. I stressed that there are no warning signs.

## 2014-06-15 NOTE — Telephone Encounter (Signed)
Robin to let pt know; ONO (overnight oximetry) shows minor low oxygen for a very short period of time, not likely enough that needs further evaluation or tx at this time.

## 2014-06-15 NOTE — Patient Instructions (Signed)
Your physician recommends that you schedule a follow-up appointment in: 3 months with Dr Klein  

## 2014-06-16 ENCOUNTER — Telehealth: Payer: Self-pay | Admitting: *Deleted

## 2014-06-16 ENCOUNTER — Encounter (HOSPITAL_COMMUNITY): Payer: Self-pay | Admitting: Emergency Medicine

## 2014-06-16 ENCOUNTER — Other Ambulatory Visit: Payer: Self-pay | Admitting: *Deleted

## 2014-06-16 DIAGNOSIS — R5383 Other fatigue: Secondary | ICD-10-CM

## 2014-06-16 DIAGNOSIS — Z794 Long term (current) use of insulin: Secondary | ICD-10-CM

## 2014-06-16 DIAGNOSIS — E876 Hypokalemia: Secondary | ICD-10-CM | POA: Diagnosis present

## 2014-06-16 DIAGNOSIS — I129 Hypertensive chronic kidney disease with stage 1 through stage 4 chronic kidney disease, or unspecified chronic kidney disease: Secondary | ICD-10-CM | POA: Diagnosis present

## 2014-06-16 DIAGNOSIS — Z79899 Other long term (current) drug therapy: Secondary | ICD-10-CM

## 2014-06-16 DIAGNOSIS — G4734 Idiopathic sleep related nonobstructive alveolar hypoventilation: Secondary | ICD-10-CM

## 2014-06-16 DIAGNOSIS — Z7982 Long term (current) use of aspirin: Secondary | ICD-10-CM

## 2014-06-16 DIAGNOSIS — N183 Chronic kidney disease, stage 3 unspecified: Secondary | ICD-10-CM | POA: Diagnosis present

## 2014-06-16 DIAGNOSIS — Z6841 Body Mass Index (BMI) 40.0 and over, adult: Secondary | ICD-10-CM

## 2014-06-16 DIAGNOSIS — I509 Heart failure, unspecified: Secondary | ICD-10-CM | POA: Diagnosis present

## 2014-06-16 DIAGNOSIS — I428 Other cardiomyopathies: Secondary | ICD-10-CM | POA: Diagnosis present

## 2014-06-16 DIAGNOSIS — E119 Type 2 diabetes mellitus without complications: Secondary | ICD-10-CM | POA: Diagnosis present

## 2014-06-16 DIAGNOSIS — R5381 Other malaise: Secondary | ICD-10-CM | POA: Diagnosis present

## 2014-06-16 DIAGNOSIS — D500 Iron deficiency anemia secondary to blood loss (chronic): Secondary | ICD-10-CM | POA: Diagnosis present

## 2014-06-16 DIAGNOSIS — I5022 Chronic systolic (congestive) heart failure: Principal | ICD-10-CM | POA: Diagnosis present

## 2014-06-16 DIAGNOSIS — J45909 Unspecified asthma, uncomplicated: Secondary | ICD-10-CM | POA: Diagnosis present

## 2014-06-16 DIAGNOSIS — N179 Acute kidney failure, unspecified: Secondary | ICD-10-CM | POA: Diagnosis present

## 2014-06-16 MED ORDER — GLUCOSE BLOOD VI STRP: ORAL_STRIP | Status: DC

## 2014-06-16 NOTE — Telephone Encounter (Signed)
My interpretation was that there was minor desaturation only with sleep, and that oxygen is not likely needed  Robin to ask for loose papers to find the report again to review again

## 2014-06-16 NOTE — Telephone Encounter (Signed)
Pt states he has being trying to get his oxygen for his Bi-pap machine. [facility] came out and did a over night oxygen test, and stated they submitted form to md. Was told by the cardiologist his PCP has to ok for oxygen. Pt is wanting oxygen ASAP he stated he has been in the hosp for 2 weeks need the oxygen at night. [facility] keep telling him they fax Dr. Jonny Ruiz and waiting on his ok. Pls advise...Raechel Chute

## 2014-06-16 NOTE — Telephone Encounter (Signed)
Found paper work and put on MD's desk

## 2014-06-16 NOTE — Telephone Encounter (Signed)
Order is done.

## 2014-06-16 NOTE — Telephone Encounter (Signed)
Called the patient left a detailed message of results and MD instructions.

## 2014-06-16 NOTE — Telephone Encounter (Signed)
Pt is needing refill on his accu chek aviva strips. Inform pt will send to his pharmacy...Raechel Chute

## 2014-06-16 NOTE — ED Notes (Signed)
Pt. arrived with EMS from home reports pain at joints ( shoulder /arms/legs) onset 3 days ago , denies fall or injury ,ambulatory.

## 2014-06-17 ENCOUNTER — Emergency Department (HOSPITAL_COMMUNITY): Payer: Medicare Other

## 2014-06-17 ENCOUNTER — Inpatient Hospital Stay (HOSPITAL_COMMUNITY)
Admission: EM | Admit: 2014-06-17 | Discharge: 2014-06-17 | DRG: 292 | Disposition: A | Discharge status: Home / Self Care | Payer: Medicare Other | Attending: Emergency Medicine | Admitting: Emergency Medicine

## 2014-06-17 DIAGNOSIS — N179 Acute kidney failure, unspecified: Secondary | ICD-10-CM

## 2014-06-17 DIAGNOSIS — I428 Other cardiomyopathies: Secondary | ICD-10-CM | POA: Diagnosis present

## 2014-06-17 DIAGNOSIS — I5022 Chronic systolic (congestive) heart failure: Principal | ICD-10-CM | POA: Diagnosis present

## 2014-06-17 DIAGNOSIS — D500 Iron deficiency anemia secondary to blood loss (chronic): Secondary | ICD-10-CM | POA: Diagnosis present

## 2014-06-17 DIAGNOSIS — K922 Gastrointestinal hemorrhage, unspecified: Secondary | ICD-10-CM | POA: Diagnosis present

## 2014-06-17 DIAGNOSIS — N183 Chronic kidney disease, stage 3 unspecified: Secondary | ICD-10-CM | POA: Diagnosis present

## 2014-06-17 DIAGNOSIS — E876 Hypokalemia: Secondary | ICD-10-CM

## 2014-06-17 DIAGNOSIS — E119 Type 2 diabetes mellitus without complications: Secondary | ICD-10-CM | POA: Diagnosis present

## 2014-06-17 DIAGNOSIS — I509 Heart failure, unspecified: Secondary | ICD-10-CM | POA: Diagnosis present

## 2014-06-17 DIAGNOSIS — D649 Anemia, unspecified: Secondary | ICD-10-CM | POA: Diagnosis present

## 2014-06-17 LAB — PREPARE RBC (CROSSMATCH)

## 2014-06-17 LAB — CBC
HEMATOCRIT: 19.3 % — AB (ref 39.0–52.0)
HEMOGLOBIN: 6.1 g/dL — AB (ref 13.0–17.0)
MCH: 23.2 pg — AB (ref 26.0–34.0)
MCHC: 31.6 g/dL (ref 30.0–36.0)
MCV: 73.4 fL — ABNORMAL LOW (ref 78.0–100.0)
Platelets: ADEQUATE 10*3/uL (ref 150–400)
RBC: 2.63 MIL/uL — ABNORMAL LOW (ref 4.22–5.81)
RDW: 18.4 % — ABNORMAL HIGH (ref 11.5–15.5)
WBC: 7.7 10*3/uL (ref 4.0–10.5)

## 2014-06-17 LAB — BASIC METABOLIC PANEL
BUN: 42 mg/dL — ABNORMAL HIGH (ref 6–23)
CALCIUM: 9.2 mg/dL (ref 8.4–10.5)
CO2: 26 mEq/L (ref 19–32)
Chloride: 89 mEq/L — ABNORMAL LOW (ref 96–112)
Creatinine, Ser: 2.78 mg/dL — ABNORMAL HIGH (ref 0.50–1.35)
GFR calc Af Amer: 32 mL/min — ABNORMAL LOW (ref >90)
GFR, EST NON AFRICAN AMERICAN: 28 mL/min — AB (ref >90)
GLUCOSE: 98 mg/dL (ref 70–99)
Potassium: 3.2 mEq/L — ABNORMAL LOW (ref 3.7–5.3)
Sodium: 133 mEq/L — ABNORMAL LOW (ref 137–147)

## 2014-06-17 LAB — CK TOTAL AND CKMB (NOT AT ARMC)
CK, MB: 1.4 ng/mL (ref 0.3–4.0)
Relative Index: INVALID (ref 0.0–2.5)
Total CK: 76 U/L (ref 7–232)

## 2014-06-17 LAB — TROPONIN I

## 2014-06-17 LAB — POC OCCULT BLOOD, ED: Fecal Occult Bld: POSITIVE — AB

## 2014-06-17 LAB — MAGNESIUM: Magnesium: 2.1 mg/dL (ref 1.5–2.5)

## 2014-06-17 LAB — HEMOGLOBIN AND HEMATOCRIT, BLOOD
HEMATOCRIT: 26.7 % — AB (ref 39.0–52.0)
Hemoglobin: 8.6 g/dL — ABNORMAL LOW (ref 13.0–17.0)

## 2014-06-17 LAB — CBG MONITORING, ED: GLUCOSE-CAPILLARY: 75 mg/dL (ref 70–99)

## 2014-06-17 MED ORDER — HYDROMORPHONE HCL PF 1 MG/ML IJ SOLN
1.0000 mg | Freq: Once | INTRAMUSCULAR | Status: AC
  Administered 2014-06-17: 1 mg via INTRAVENOUS
  Filled 2014-06-17: qty 1

## 2014-06-17 MED ORDER — HYDROCODONE-ACETAMINOPHEN 5-325 MG PO TABS: 2.0000 | ORAL_TABLET | ORAL | Status: DC | PRN

## 2014-06-17 MED ORDER — ASPIRIN EC 81 MG PO TBEC: 81.0000 mg | DELAYED_RELEASE_TABLET | Freq: Every day | ORAL | Status: DC

## 2014-06-17 MED ORDER — AMIODARONE HCL 200 MG PO TABS: 200.0000 mg | ORAL_TABLET | Freq: Two times a day (BID) | ORAL | Status: DC

## 2014-06-17 MED ORDER — MAGNESIUM OXIDE 400 MG PO TABS
400.0000 mg | ORAL_TABLET | Freq: Two times a day (BID) | ORAL | Status: DC
Start: 2014-06-17 — End: 2014-06-17

## 2014-06-17 MED ORDER — INSULIN GLARGINE 100 UNIT/ML ~~LOC~~ SOLN: 10.0000 [IU] | Freq: Every day | SUBCUTANEOUS | Status: DC

## 2014-06-17 MED ORDER — OXYCODONE-ACETAMINOPHEN 5-325 MG PO TABS
1.0000 | ORAL_TABLET | Freq: Four times a day (QID) | ORAL | Status: DC | PRN
Start: 2014-06-17 — End: 2014-06-17

## 2014-06-17 MED ORDER — PROMETHAZINE HCL 25 MG PO TABS: 25.0000 mg | ORAL_TABLET | Freq: Four times a day (QID) | ORAL | Status: DC | PRN

## 2014-06-17 MED ORDER — BUMETANIDE 2 MG PO TABS: 5.0000 mg | ORAL_TABLET | Freq: Two times a day (BID) | ORAL | Status: DC

## 2014-06-17 MED ORDER — HYDRALAZINE HCL 25 MG PO TABS: 25.0000 mg | ORAL_TABLET | Freq: Three times a day (TID) | ORAL | Status: DC

## 2014-06-17 MED ORDER — PANTOPRAZOLE SODIUM 40 MG PO TBEC: 40.0000 mg | DELAYED_RELEASE_TABLET | Freq: Every day | ORAL | Status: DC

## 2014-06-17 MED ORDER — POTASSIUM CHLORIDE CRYS ER 20 MEQ PO TBCR
40.0000 meq | EXTENDED_RELEASE_TABLET | Freq: Once | ORAL | Status: AC
  Administered 2014-06-17: 40 meq via ORAL
  Filled 2014-06-17: qty 2

## 2014-06-17 MED ORDER — OXYCODONE-ACETAMINOPHEN 5-325 MG PO TABS
1.0000 | ORAL_TABLET | Freq: Once | ORAL | Status: DC
  Filled 2014-06-17: qty 1

## 2014-06-17 MED ORDER — HYDROMORPHONE HCL PF 1 MG/ML IJ SOLN: 0.5000 mg | INTRAMUSCULAR | Status: DC | PRN

## 2014-06-17 MED ORDER — ONDANSETRON HCL 4 MG/2ML IJ SOLN: 4.0000 mg | Freq: Three times a day (TID) | INTRAMUSCULAR | Status: DC | PRN

## 2014-06-17 MED ORDER — CARVEDILOL 3.125 MG PO TABS: 3.1250 mg | ORAL_TABLET | Freq: Two times a day (BID) | ORAL | Status: DC

## 2014-06-17 MED ORDER — INSULIN ASPART 100 UNIT/ML ~~LOC~~ SOLN: 0.0000 [IU] | SUBCUTANEOUS | Status: DC

## 2014-06-17 MED ORDER — ALBUTEROL SULFATE HFA 108 (90 BASE) MCG/ACT IN AERS: 2.0000 | INHALATION_SPRAY | Freq: Every day | RESPIRATORY_TRACT | Status: DC | PRN

## 2014-06-17 MED ORDER — POTASSIUM CHLORIDE CRYS ER 20 MEQ PO TBCR: 60.0000 meq | EXTENDED_RELEASE_TABLET | Freq: Two times a day (BID) | ORAL | Status: DC

## 2014-06-17 MED ORDER — ISOSORBIDE MONONITRATE ER 60 MG PO TB24: 60.0000 mg | ORAL_TABLET | Freq: Every day | ORAL | Status: DC

## 2014-06-17 MED ORDER — ATORVASTATIN CALCIUM 80 MG PO TABS: 80.0000 mg | ORAL_TABLET | Freq: Every day | ORAL | Status: DC

## 2014-06-17 MED ORDER — METOLAZONE 2.5 MG PO TABS: 2.5000 mg | ORAL_TABLET | ORAL | Status: DC

## 2014-06-17 NOTE — ED Notes (Signed)
Written consent received from patient for blood transfusion.

## 2014-06-17 NOTE — Progress Notes (Signed)
Weekend CSW consulted to assist with cab voucher. CSW met with patient who was agreeable to cab ride home, denied that he had family or friends to transport him home. CSW contacted [employer] to transport patient home at 9:25 am.  Kristin Drinkard, MSW, LCSWA Clinical Social Worker [facility]. [phone]  

## 2014-06-17 NOTE — ED Notes (Signed)
Dr Zavitz at bedside  

## 2014-06-17 NOTE — ED Notes (Signed)
CBG 75. °

## 2014-06-17 NOTE — ED Provider Notes (Signed)
CSN: [id number]     Arrival date & time 06/16/14  2349 History   First MD Initiated Contact with Patient 06/17/14 0150     Chief Complaint  Patient presents with  . Joint Pain     (Consider location/radiation/quality/duration/timing/severity/associated sxs/prior Treatment) HPI Comments: 38 year old male with significant medical history including cardiomyopathy, morbid obesity, sleep apnea, high blood pressure, lipids, asthma, CHF, hypokalemia, DKA, diabetes, ventricular tachycardia presents with diffuse bodyaches and pain. Pain started bilateral fee and calcaneus region and radiates up both legs and now patient has pain in most of his joints and muscles worse in the legs. Patient has not had significant pain like this in the past.  No injuries or joint swelling or joint disease history. Patient is on a cholesterol med and no issues with in the past. Patient has electrolyte problems in the past. Patient was recently admitted and had significant diuresis from CHF. Patient has been more active recently since discharge. Patient feels his CHF is doing well and is not short of breath and no weight gain   The history is provided by the patient.    Past Medical History  Diagnosis Date  . Chronic systolic CHF (congestive heart failure)     On home milrinone - dose titrate to 0.351mcg/kg/min 03/2014.  Marland Kitchen Essential hypertension, benign   . Morbid obesity with BMI of 50.0-59.9, adult   . Dyslipidemia   . CKD (chronic kidney disease), stage III   . Asthma   . Microcytic anemia   . NICM (nonischemic cardiomyopathy)   . History of left heart catheterization     Normal coronaries October 2014  . History of chicken pox   . Perirectal abscess 03/2014  . Allergic rhinitis 04/29/2014  . Anxiety   . Chronic bronchitis   . Sleep apnea     BIPAP  . Prediabetes   . DKA (diabetic ketoacidoses) 02/25/2014   Past Surgical History  Procedure Laterality Date  . Incision and drainage abscess N/A 03/31/2014   Procedure: INCISION AND DRAINAGE  PERI-RECTAL ABSCESS;  Surgeon: Axel Filler, MD;  Location: [facility] OR;  Service: General;  Laterality: N/A;  . Cardiac catheterization  October 2014    [facility]; reportedly clean, no further details available   Family History  Problem Relation Age of Onset  . Hypertension Mother   . Diabetes Maternal Grandmother   . Liver disease Father    History  Substance Use Topics  . Smoking status: Never Smoker   . Smokeless tobacco: Never Used  . Alcohol Use: No    Review of Systems  Constitutional: Positive for fatigue. Negative for fever and chills.  HENT: Negative for congestion.   Eyes: Negative for visual disturbance.  Respiratory: Negative for shortness of breath.   Cardiovascular: Negative for chest pain.  Gastrointestinal: Negative for vomiting and abdominal pain.  Genitourinary: Negative for dysuria and flank pain.  Musculoskeletal: Positive for arthralgias. Negative for neck pain and neck stiffness.  Skin: Negative for rash.  Neurological: Negative for light-headedness and headaches.      Allergies  Iodine and Shellfish allergy  Home Medications   Prior to Admission medications   Medication Sig Start Date End Date Taking? Authorizing Sohil Timko  albuterol (PROVENTIL HFA;VENTOLIN HFA) 108 (90 BASE) MCG/ACT inhaler Inhale 2 puffs into the lungs daily as needed for wheezing or shortness of breath. 01/08/14   Wilburt Finlay, PA-C  amiodarone (PACERONE) 400 MG tablet Take 0.5 tablets (200 mg total) by mouth 2 (two) times daily. 05/26/14  Laurey Morale, MD  aspirin EC 81 MG tablet Take 81 mg by mouth daily.    Historical Gotti Alwin, MD  atorvastatin (LIPITOR) 80 MG tablet Take 80 mg by mouth daily. 01/08/14   Wilburt Finlay, PA-C  bumetanide (BUMEX) 2 MG tablet Take 2.5 tablets (5 mg total) by mouth 2 (two) times daily. 05/26/14   Laurey Morale, MD  carvedilol (COREG) 3.125 MG tablet Take 1 tablet (3.125 mg total) by mouth 2 (two)  times daily. 02/20/14   Dolores Patty, MD  glucose blood (ACCU-CHEK AVIVA) test strip Use to check blood sugars three times a day before each meal Dx 250.00 06/16/14   Corwin Levins, MD  hydrALAZINE (APRESOLINE) 25 MG tablet Take 1 tablet (25 mg total) by mouth every 8 (eight) hours. 01/23/14   Amy D Clegg, NP  insulin aspart (NOVOLOG FLEXPEN) 100 UNIT/ML FlexPen Inject 1-15 Units into the skin 3 (three) times daily with meals. Sliding scale 04/27/14   Corwin Levins, MD  insulin glargine (LANTUS) 100 UNIT/ML injection Inject 0.2 mLs (20 Units total) into the skin at bedtime. 06/13/14   Zannie Cove, MD  isosorbide mononitrate (IMDUR) 60 MG 24 hr tablet Take 1 tablet (60 mg total) by mouth daily. 05/18/14   Aundria Rud, NP  loratadine (CLARITIN) 10 MG tablet Take 10 mg by mouth daily as needed. 04/27/14   Corwin Levins, MD  magnesium oxide (MAG-OX) 400 MG tablet Take 1 tablet (400 mg total) by mouth 2 (two) times daily. 03/07/14   Aundria Rud, NP  metolazone (ZAROXOLYN) 2.5 MG tablet Take 1 tablet (2.5 mg total) by mouth once a week. 06/13/14   Zannie Cove, MD  milrinone [facility]) 20 MG/100ML SOLN infusion Inject 62.7375 mcg/min into the vein continuous. 04/15/14   Ok Anis, NP  oxyCODONE-acetaminophen (PERCOCET/ROXICET) 5-325 MG per tablet Take 1 tablet by mouth every 6 (six) hours as needed for severe pain. 05/23/14   Jamesetta Orleans Lawyer, PA-C  pantoprazole (PROTONIX) 40 MG tablet Take 1 tablet (40 mg total) by mouth daily. 05/18/14   Aundria Rud, NP  potassium chloride SA (K-DUR,KLOR-CON) 20 MEQ tablet Take 3 tablets (60 mEq total) by mouth 2 (two) times daily. 05/26/14   Laurey Morale, MD  promethazine (PHENERGAN) 25 MG tablet Take 1 tablet (25 mg total) by mouth every 6 (six) hours as needed for nausea or vomiting. 05/23/14   Jamesetta Orleans Lawyer, PA-C   BP 99/66  Pulse 95  Temp(Src) 97.7 F (36.5 C) (Oral)  Resp 20  Ht 5\' 8"  (1.727 m)  Wt 331 lb (150.141 kg)  BMI 50.34 kg/m2   SpO2 98% Physical Exam  Nursing note and vitals reviewed. Constitutional: He is oriented to person, place, and time. He appears well-developed and well-nourished.  HENT:  Head: Normocephalic and atraumatic.  Eyes: Conjunctivae are normal. Right eye exhibits no discharge. Left eye exhibits no discharge.  Neck: Normal range of motion. Neck supple. No tracheal deviation present.  Cardiovascular: Normal rate and regular rhythm.   Pulmonary/Chest: Effort normal and breath sounds normal.  Abdominal: Soft. He exhibits no distension (obese). There is no tenderness. There is no guarding.  Musculoskeletal: He exhibits tenderness. He exhibits no edema.  Patient has diffuse mild tenderness to feet, knees bilateral without effusion or sign of infection. Mild lower extremity edema chronic for patient.  Neurological: He is alert and oriented to person, place, and time.  Skin: Skin is warm. No rash noted.  Psychiatric:  He has a normal mood and affect.    ED Course  Procedures (including critical care time) Labs Review Labs Reviewed  CBC - Abnormal; Notable for the following:    RBC 2.63 (*)    Hemoglobin 6.1 (*)    HCT 19.3 (*)    MCV 73.4 (*)    MCH 23.2 (*)    RDW 18.4 (*)    All other components within normal limits  BASIC METABOLIC PANEL - Abnormal; Notable for the following:    Sodium 133 (*)    Potassium 3.2 (*)    Chloride 89 (*)    BUN 42 (*)    Creatinine, Ser 2.78 (*)    GFR calc non Af Amer 28 (*)    GFR calc Af Amer 32 (*)    All other components within normal limits  HEMOGLOBIN AND HEMATOCRIT, BLOOD - Abnormal; Notable for the following:    Hemoglobin 8.6 (*)    HCT 26.7 (*)    All other components within normal limits  POC OCCULT BLOOD, ED - Abnormal; Notable for the following:    Fecal Occult Bld POSITIVE (*)    All other components within normal limits  MAGNESIUM  CK TOTAL AND CKMB  TROPONIN I  TYPE AND SCREEN  PREPARE RBC (CROSSMATCH)    Imaging Review Dg Chest  Portable 1 View  06/17/2014   CLINICAL DATA:  Joint pain  EXAM: PORTABLE CHEST - 1 VIEW  COMPARISON:  06/04/2014  FINDINGS: Chronic cardiopericardial enlargement. Unchanged upper mediastinal contours. There is a right upper extremity PICC with tip near the upper SVC.  Symmetric haziness of the chest which likely reflects overlapping soft tissue attenuation, although there was a similar appearance when the patient had airspace nodularity by CT (05/23/2014). There is no asymmetric opacity, definitive effusion, or pneumothorax. Pulmonary venous congestion as previously noted.  IMPRESSION: 1. Unchanged cardiomegaly and pulmonary venous congestion. 2. Cannot exclude airspace disease in the lower lungs.   Electronically Signed   By: Tiburcio PeaJonathan  Watts M.D.   On: 06/17/2014 03:51     EKG Interpretation   Date/Time:  Saturday June 17 2014 03:43:35 EDT Ventricular Rate:  97 PR Interval:  199 QRS Duration: 148 QT Interval:  400 QTC Calculation: 508 R Axis:   -58 Text Interpretation:  Sinus tachycardia Paired ventricular premature  complexes Aberrant conduction of SV complex(es) Borderline prolonged PR  interval Nonspecific IVCD with LAD Inferior infarct, old Confirmed by  ZAVITZ  MD, JOSHUA ([id number]) on 06/17/2014 4:00:41 AM Also confirmed by Jodi MourningZAVITZ   MD, JOSHUA ([id number])  on 06/17/2014 4:22:34 AM      MDM   Final diagnoses:  Chronic systolic congestive heart failure  Blood loss anemia  Hypokalemia  Acute renal failure, unspecified acute renal failure type   Clinically discussed differential of electrolytes versus medication side effect versus dehydration from recent significant diuresis versus musculoskeletal from increased rehabilitation. Plan for blood work and pain meds.  Patient's blood work revealed worsening anemia and baseline, patient does say he feels fatigued but denies chest pain or shortness of breath. Patient denies gross blood in the stools Hemoccult-positive. Discussed risks and benefits  of receiving blood transfusion patient recently received last admission.  One unit of blood ordered for patient however since patient has minimal other symptoms repeat H&H ordered prior transfusions. Repeat hemoglobin at patient's baseline and patient on recheck primary concern her body pains. Repeat pain medicines given improved patient's symptoms.  Dr. Julian ReilGardner and myself as he recommended full at  [facility] and patient refused and wished to be discharged. He has the capacity to make decisions and has followup with his primary specialist and Dr.  Once the second hemoglobin was at his baseline blood transfusion was canceled, patient did not receive any blood in ER. Patient's pain improved on recheck and I discussed his worsening kidney function is concave medical history however patient is not to be observed in the hospital and says his appointments Monday and Tuesday of the specialists and will followup then. Patient understands he'll return at any time for worsening or new symptoms or this is a little bit her blood pressure   The patients results and plan were reviewed and discussed.   Any x-rays performed were personally reviewed by myself.   Differential diagnosis were considered with the presenting HPI.  Medications  HYDROmorphone (DILAUDID) injection 0.5 mg (not administered)  ondansetron (ZOFRAN) injection 4 mg (not administered)  albuterol (PROVENTIL HFA;VENTOLIN HFA) 108 (90 BASE) MCG/ACT inhaler 2 puff (not administered)  amiodarone (PACERONE) tablet 200 mg (not administered)  aspirin EC tablet 81 mg (not administered)  bumetanide (BUMEX) tablet 5 mg (not administered)  atorvastatin (LIPITOR) tablet 80 mg (not administered)  carvedilol (COREG) tablet 3.125 mg (not administered)  hydrALAZINE (APRESOLINE) tablet 25 mg (not administered)  isosorbide mononitrate (IMDUR) 24 hr tablet 60 mg (not administered)  magnesium oxide (MAG-OX) tablet 400 mg (not administered)  metolazone  (ZAROXOLYN) tablet 2.5 mg (not administered)  oxyCODONE-acetaminophen (PERCOCET/ROXICET) 5-325 MG per tablet 1 tablet (not administered)  pantoprazole (PROTONIX) EC tablet 40 mg (not administered)  potassium chloride SA (K-DUR,KLOR-CON) CR tablet 60 mEq (not administered)  promethazine (PHENERGAN) tablet 25 mg (not administered)  insulin glargine (LANTUS) injection 10 Units (not administered)  insulin aspart (novoLOG) injection 0-9 Units (not administered)  HYDROmorphone (DILAUDID) injection 1 mg (1 mg Intravenous Given 06/17/14 0231)  HYDROmorphone (DILAUDID) injection 1 mg (1 mg Intravenous Given 06/17/14 0350)  potassium chloride SA (K-DUR,KLOR-CON) CR tablet 40 mEq (40 mEq Oral Given 06/17/14 0649)      Filed Vitals:   06/16/14 2357 06/17/14 0130 06/17/14 0344  BP: 99/66 94/72 119/81  Pulse: 95 95   Temp: 97.7 F (36.5 C)    TempSrc: Oral    Resp: 20 25 21   Height: 5\' 8"  (1.727 m)    Weight: 331 lb (150.141 kg)    SpO2: 98% 100% 99%    Admission/ observation were discussed with the admitting physician, patient and/or family and they are comfortable with the plan.     Enid Skeens, MD 06/17/14 [phone]

## 2014-06-17 NOTE — ED Notes (Signed)
Hgb reported to Dr. Jodi Mourning and Dr. Beacher May and ordered to cancel blood transfusion. Transfusion had not yet started prior to this.

## 2014-06-17 NOTE — ED Notes (Signed)
Pt called out stating that he is in severe pain, and that "somebody needs to something about this pain". This RN explained to the pt that she is unable to administer any pain medication until the physician has seen him. This RN offered the pt a heat or ice pack to help ease his pain. The pt stated "That won't help, the pain is inside my body". This RN again apologized for the wait, and told the pt that the physician would be in a soon as he was able.

## 2014-06-17 NOTE — Discharge Instructions (Signed)
Take your medicines as previously arranged by her physicians especially her oral potassium. Followup closely with your specialists as previously arranged for early this week. Return to the ER if your symptoms worsen develop new symptoms or you change your mind and agree to come in the hospital.  If you were given medicines take as directed.  If you are on coumadin or contraceptives realize their levels and effectiveness is altered by many different medicines.  If you have any reaction (rash, tongues swelling, other) to the medicines stop taking and see a physician.   Please follow up as directed and return to the ER or see a physician for new or worsening symptoms.  Thank you. Filed Vitals:   06/17/14 0400 06/17/14 0415 06/17/14 0428 06/17/14 0430  BP: 111/72 104/67 119/75 115/70  Pulse: 70 96 105 103  Temp:   97.4 F (36.3 C)   TempSrc:   Oral   Resp:   22 20  Height:      Weight:      SpO2: 100% 100% 100% 100%

## 2014-06-17 NOTE — ED Notes (Signed)
[facility] AT BEDSIDE. 

## 2014-06-17 NOTE — ED Notes (Signed)
Pt unable to travel via [facility] to home d/t medication pump, pt to see Case Management for assistance with transport home

## 2014-06-19 ENCOUNTER — Emergency Department (HOSPITAL_COMMUNITY): Payer: Medicare Other

## 2014-06-19 ENCOUNTER — Other Ambulatory Visit: Payer: Self-pay

## 2014-06-19 ENCOUNTER — Encounter (HOSPITAL_COMMUNITY): Payer: Self-pay | Admitting: Emergency Medicine

## 2014-06-19 ENCOUNTER — Emergency Department (HOSPITAL_COMMUNITY)
Admission: EM | Admit: 2014-06-19 | Discharge: 2014-06-20 | Disposition: A | Discharge status: Home / Self Care | Payer: Medicare Other | Attending: Emergency Medicine | Admitting: Emergency Medicine

## 2014-06-19 ENCOUNTER — Encounter (HOSPITAL_COMMUNITY): Payer: Self-pay

## 2014-06-19 ENCOUNTER — Ambulatory Visit (HOSPITAL_COMMUNITY)
Admission: RE | Admit: 2014-06-19 | Discharge: 2014-06-19 | Disposition: A | Discharge status: Home / Self Care | Payer: Medicare Other | Source: Ambulatory Visit | Attending: Internal Medicine | Admitting: Internal Medicine

## 2014-06-19 VITALS — BP 110/70 | HR 103 | Wt 334.8 lb

## 2014-06-19 DIAGNOSIS — Z8709 Personal history of other diseases of the respiratory system: Secondary | ICD-10-CM | POA: Insufficient documentation

## 2014-06-19 DIAGNOSIS — I129 Hypertensive chronic kidney disease with stage 1 through stage 4 chronic kidney disease, or unspecified chronic kidney disease: Secondary | ICD-10-CM | POA: Insufficient documentation

## 2014-06-19 DIAGNOSIS — I5022 Chronic systolic (congestive) heart failure: Secondary | ICD-10-CM | POA: Diagnosis not present

## 2014-06-19 DIAGNOSIS — Z862 Personal history of diseases of the blood and blood-forming organs and certain disorders involving the immune mechanism: Secondary | ICD-10-CM | POA: Diagnosis not present

## 2014-06-19 DIAGNOSIS — I509 Heart failure, unspecified: Secondary | ICD-10-CM | POA: Diagnosis present

## 2014-06-19 DIAGNOSIS — G4733 Obstructive sleep apnea (adult) (pediatric): Secondary | ICD-10-CM | POA: Diagnosis not present

## 2014-06-19 DIAGNOSIS — Z8679 Personal history of other diseases of the circulatory system: Secondary | ICD-10-CM | POA: Diagnosis not present

## 2014-06-19 DIAGNOSIS — Z8619 Personal history of other infectious and parasitic diseases: Secondary | ICD-10-CM | POA: Insufficient documentation

## 2014-06-19 DIAGNOSIS — R110 Nausea: Secondary | ICD-10-CM | POA: Insufficient documentation

## 2014-06-19 DIAGNOSIS — I5032 Chronic diastolic (congestive) heart failure: Secondary | ICD-10-CM | POA: Diagnosis not present

## 2014-06-19 DIAGNOSIS — N189 Chronic kidney disease, unspecified: Secondary | ICD-10-CM | POA: Diagnosis not present

## 2014-06-19 DIAGNOSIS — I4949 Other premature depolarization: Secondary | ICD-10-CM | POA: Insufficient documentation

## 2014-06-19 DIAGNOSIS — Z8719 Personal history of other diseases of the digestive system: Secondary | ICD-10-CM | POA: Diagnosis not present

## 2014-06-19 DIAGNOSIS — J45909 Unspecified asthma, uncomplicated: Secondary | ICD-10-CM | POA: Diagnosis not present

## 2014-06-19 DIAGNOSIS — E1110 Type 2 diabetes mellitus with ketoacidosis without coma: Secondary | ICD-10-CM | POA: Insufficient documentation

## 2014-06-19 DIAGNOSIS — E785 Hyperlipidemia, unspecified: Secondary | ICD-10-CM | POA: Diagnosis not present

## 2014-06-19 DIAGNOSIS — Z9861 Coronary angioplasty status: Secondary | ICD-10-CM | POA: Insufficient documentation

## 2014-06-19 DIAGNOSIS — E876 Hypokalemia: Secondary | ICD-10-CM | POA: Diagnosis not present

## 2014-06-19 DIAGNOSIS — E119 Type 2 diabetes mellitus without complications: Secondary | ICD-10-CM | POA: Diagnosis not present

## 2014-06-19 DIAGNOSIS — R1084 Generalized abdominal pain: Secondary | ICD-10-CM | POA: Diagnosis present

## 2014-06-19 DIAGNOSIS — N183 Chronic kidney disease, stage 3 unspecified: Secondary | ICD-10-CM | POA: Diagnosis not present

## 2014-06-19 DIAGNOSIS — R17 Unspecified jaundice: Secondary | ICD-10-CM | POA: Insufficient documentation

## 2014-06-19 DIAGNOSIS — D649 Anemia, unspecified: Secondary | ICD-10-CM | POA: Diagnosis not present

## 2014-06-19 DIAGNOSIS — Z8739 Personal history of other diseases of the musculoskeletal system and connective tissue: Secondary | ICD-10-CM | POA: Insufficient documentation

## 2014-06-19 DIAGNOSIS — G4730 Sleep apnea, unspecified: Secondary | ICD-10-CM | POA: Diagnosis not present

## 2014-06-19 DIAGNOSIS — Z8659 Personal history of other mental and behavioral disorders: Secondary | ICD-10-CM | POA: Diagnosis not present

## 2014-06-19 DIAGNOSIS — N179 Acute kidney failure, unspecified: Secondary | ICD-10-CM

## 2014-06-19 DIAGNOSIS — Z7982 Long term (current) use of aspirin: Secondary | ICD-10-CM | POA: Diagnosis not present

## 2014-06-19 DIAGNOSIS — E662 Morbid (severe) obesity with alveolar hypoventilation: Secondary | ICD-10-CM | POA: Diagnosis not present

## 2014-06-19 LAB — CBC WITH DIFFERENTIAL/PLATELET
BASOS PCT: 0 % (ref 0–1)
Basophils Absolute: 0 10*3/uL (ref 0.0–0.1)
Eosinophils Absolute: 0.1 10*3/uL (ref 0.0–0.7)
Eosinophils Relative: 1 % (ref 0–5)
HCT: 27.1 % — ABNORMAL LOW (ref 39.0–52.0)
HEMOGLOBIN: 8.7 g/dL — AB (ref 13.0–17.0)
LYMPHS ABS: 1 10*3/uL (ref 0.7–4.0)
Lymphocytes Relative: 19 % (ref 12–46)
MCH: 23.1 pg — ABNORMAL LOW (ref 26.0–34.0)
MCHC: 32.1 g/dL (ref 30.0–36.0)
MCV: 72.1 fL — ABNORMAL LOW (ref 78.0–100.0)
Monocytes Absolute: 0.4 10*3/uL (ref 0.1–1.0)
Monocytes Relative: 8 % (ref 3–12)
NEUTROS PCT: 72 % (ref 43–77)
Neutro Abs: 3.7 10*3/uL (ref 1.7–7.7)
Platelets: 284 10*3/uL (ref 150–400)
RBC: 3.76 MIL/uL — AB (ref 4.22–5.81)
RDW: 18.6 % — ABNORMAL HIGH (ref 11.5–15.5)
WBC: 5.1 10*3/uL (ref 4.0–10.5)

## 2014-06-19 LAB — TROPONIN I: Troponin I: <0.3 ng/mL (ref <0.30)

## 2014-06-19 LAB — URINALYSIS, ROUTINE W REFLEX MICROSCOPIC
BILIRUBIN URINE: NEGATIVE
Glucose, UA: NEGATIVE mg/dL
HGB URINE DIPSTICK: NEGATIVE
Ketones, ur: NEGATIVE mg/dL
Leukocytes, UA: NEGATIVE
Nitrite: NEGATIVE
PH: 6 (ref 5.0–8.0)
Protein, ur: NEGATIVE mg/dL
SPECIFIC GRAVITY, URINE: 1.013 (ref 1.005–1.030)
UROBILINOGEN UA: 1 mg/dL (ref 0.0–1.0)

## 2014-06-19 LAB — PRO B NATRIURETIC PEPTIDE: Pro B Natriuretic peptide (BNP): 1987 pg/mL — ABNORMAL HIGH (ref 0–125)

## 2014-06-19 LAB — COMPREHENSIVE METABOLIC PANEL
ALBUMIN: 3.7 g/dL (ref 3.5–5.2)
ALT: 53 U/L (ref 0–53)
AST: 51 U/L — ABNORMAL HIGH (ref 0–37)
Alkaline Phosphatase: 170 U/L — ABNORMAL HIGH (ref 39–117)
BUN: 45 mg/dL — ABNORMAL HIGH (ref 6–23)
CO2: 24 mEq/L (ref 19–32)
Calcium: 9.1 mg/dL (ref 8.4–10.5)
Chloride: 91 mEq/L — ABNORMAL LOW (ref 96–112)
Creatinine, Ser: 2.32 mg/dL — ABNORMAL HIGH (ref 0.50–1.35)
GFR calc Af Amer: 40 mL/min — ABNORMAL LOW (ref >90)
GFR, EST NON AFRICAN AMERICAN: 35 mL/min — AB (ref >90)
Glucose, Bld: 127 mg/dL — ABNORMAL HIGH (ref 70–99)
POTASSIUM: 2.9 meq/L — AB (ref 3.7–5.3)
Sodium: 133 mEq/L — ABNORMAL LOW (ref 137–147)
Total Bilirubin: 4.3 mg/dL — ABNORMAL HIGH (ref 0.3–1.2)
Total Protein: 8.7 g/dL — ABNORMAL HIGH (ref 6.0–8.3)

## 2014-06-19 LAB — I-STAT CG4 LACTIC ACID, ED: LACTIC ACID, VENOUS: 2.19 mmol/L (ref 0.5–2.2)

## 2014-06-19 MED ORDER — POTASSIUM CHLORIDE 10 MEQ/100ML IV SOLN
10.0000 meq | INTRAVENOUS | Status: DC
  Filled 2014-06-19: qty 100

## 2014-06-19 MED ORDER — POTASSIUM CHLORIDE CRYS ER 10 MEQ PO TBCR: 80.0000 meq | EXTENDED_RELEASE_TABLET | Freq: Two times a day (BID) | ORAL | Status: AC

## 2014-06-19 MED ORDER — SODIUM CHLORIDE 0.9 % IV SOLN: Freq: Once | INTRAVENOUS | Status: DC

## 2014-06-19 MED ORDER — HYDROMORPHONE HCL PF 1 MG/ML IJ SOLN
1.0000 mg | Freq: Once | INTRAMUSCULAR | Status: AC
  Administered 2014-06-19: 1 mg via INTRAVENOUS
  Filled 2014-06-19: qty 1

## 2014-06-19 MED ORDER — POTASSIUM CHLORIDE CRYS ER 20 MEQ PO TBCR
60.0000 meq | EXTENDED_RELEASE_TABLET | Freq: Once | ORAL | Status: AC
  Administered 2014-06-20: 60 meq via ORAL
  Filled 2014-06-19: qty 3

## 2014-06-19 MED ORDER — ONDANSETRON HCL 4 MG/2ML IJ SOLN
4.0000 mg | Freq: Once | INTRAMUSCULAR | Status: AC
  Administered 2014-06-19: 4 mg via INTRAVENOUS
  Filled 2014-06-19: qty 2

## 2014-06-19 MED ORDER — MILRINONE IN DEXTROSE 20 MG/100ML IV SOLN: 0.5000 ug/kg/min | INTRAVENOUS | Status: AC

## 2014-06-19 MED ORDER — MAGNESIUM SULFATE 40 MG/ML IJ SOLN
2.0000 g | Freq: Once | INTRAMUSCULAR | Status: AC
  Administered 2014-06-19: 2 g via INTRAVENOUS
  Filled 2014-06-19: qty 50

## 2014-06-19 NOTE — ED Notes (Signed)
Patient with chest and abdominal pain that started 2 hours ago.  Patient is on Milrinone.  Patient has pain allover his body.  Patient was seen two days ago for the same.

## 2014-06-19 NOTE — ED Notes (Signed)
Pt alert, NAD, calm, "feels better". VSS. To [place]. pain decreased, (denies: nausea, sob or dizziness).

## 2014-06-19 NOTE — ED Provider Notes (Signed)
CSN: [id number]     Arrival date & time 06/19/14  2117 History   First MD Initiated Contact with Patient 06/19/14 2143     Chief Complaint  Patient presents with  . Abdominal Pain  . Chest Pain     (Consider location/radiation/quality/duration/timing/severity/associated sxs/prior Treatment) HPI  This is a 38 year old male with a history of cardiomyopathy, CHF on milrinone, morbid obesity, sleep apnea, asthma, hypokalemia, diabetes who presents with abdominal pain. Patient reports he's had ongoing abdominal pain for several weeks. He states that he was seen and evaluated earlier this month and had a CT scan and an ultrasound that were "negative."  He has been referred to GI by his primary care physician. His reports ongoing pain that is mostly under his ribs and radiates to his bilateral flanks.  Describes pain as "tightness."  Pain is not associated with food. Currently he rates his pain 8/10. Nothing makes it better or worse. He endorses nausea without vomiting or diarrhea. Patient was seen and evaluated 2 days ago for diffuse bodyaches. At that time he was offered admission but declined. Patient denies any new shortness of breath or chest pain. He was seen by Dr. Gala Romney earlier today and states "my CHF is fine."     Past Medical History  Diagnosis Date  . Chronic systolic CHF (congestive heart failure)     On home milrinone - dose titrate to 0.365mcg/kg/min 03/2014.  Marland Kitchen Essential hypertension, benign   . Morbid obesity with BMI of 50.0-59.9, adult   . Dyslipidemia   . CKD (chronic kidney disease), stage III   . Asthma   . Microcytic anemia   . NICM (nonischemic cardiomyopathy)   . History of left heart catheterization     Normal coronaries October 2014  . History of chicken pox   . Perirectal abscess 03/2014  . Allergic rhinitis 04/29/2014  . Anxiety   . Chronic bronchitis   . Sleep apnea     BIPAP  . Prediabetes   . DKA (diabetic ketoacidoses) 02/25/2014   Past Surgical History   Procedure Laterality Date  . Incision and drainage abscess N/A 03/31/2014    Procedure: INCISION AND DRAINAGE  PERI-RECTAL ABSCESS;  Surgeon: Axel Filler, MD;  Location: [facility] OR;  Service: General;  Laterality: N/A;  . Cardiac catheterization  October 2014    [facility]; reportedly clean, no further details available   Family History  Problem Relation Age of Onset  . Hypertension Mother   . Diabetes Maternal Grandmother   . Liver disease Father    History  Substance Use Topics  . Smoking status: Never Smoker   . Smokeless tobacco: Never Used  . Alcohol Use: No    Review of Systems  Constitutional: Negative.  Negative for fever.  Respiratory: Negative.  Negative for chest tightness and shortness of breath.   Cardiovascular: Negative.  Negative for chest pain.  Gastrointestinal: Positive for nausea and abdominal pain. Negative for vomiting, diarrhea, constipation and abdominal distention.  Genitourinary: Negative.  Negative for dysuria.  Skin: Negative for rash.  Neurological: Negative for headaches.  All other systems reviewed and are negative.     Allergies  Iodine and Shellfish allergy  Home Medications   Prior to Admission medications   Medication Sig Start Date End Date Taking? Authorizing Provider  albuterol (PROVENTIL HFA;VENTOLIN HFA) 108 (90 BASE) MCG/ACT inhaler Inhale 2 puffs into the lungs daily as needed for wheezing or shortness of breath. 01/08/14   Wilburt Finlay, PA-C  amiodarone (PACERONE)  400 MG tablet Take 0.5 tablets (200 mg total) by mouth 2 (two) times daily. 05/26/14   Laurey Morale, MD  aspirin EC 81 MG tablet Take 81 mg by mouth daily.    Historical Provider, MD  atorvastatin (LIPITOR) 80 MG tablet Take 80 mg by mouth daily. 01/08/14   Wilburt Finlay, PA-C  bumetanide (BUMEX) 2 MG tablet Take 2.5 tablets (5 mg total) by mouth 2 (two) times daily. 05/26/14   Laurey Morale, MD  carvedilol (COREG) 3.125 MG tablet Take 1 tablet (3.125 mg  total) by mouth 2 (two) times daily. 02/20/14   Dolores Patty, MD  hydrALAZINE (APRESOLINE) 25 MG tablet Take 1 tablet (25 mg total) by mouth every 8 (eight) hours. 01/23/14   Amy D Filbert Schilder, NP  HYDROcodone-acetaminophen (NORCO) 5-325 MG per tablet Take 2 tablets by mouth every 4 (four) hours as needed. 06/17/14   Enid Skeens, MD  insulin aspart (NOVOLOG FLEXPEN) 100 UNIT/ML FlexPen Inject 1-15 Units into the skin 3 (three) times daily with meals. Sliding scale 04/27/14   Corwin Levins, MD  insulin glargine (LANTUS) 100 UNIT/ML injection Inject 0.2 mLs (20 Units total) into the skin at bedtime. 06/13/14   Zannie Cove, MD  isosorbide mononitrate (IMDUR) 60 MG 24 hr tablet Take 1 tablet (60 mg total) by mouth daily. 05/18/14   Aundria Rud, NP  loratadine (CLARITIN) 10 MG tablet Take 10 mg by mouth daily as needed. 04/27/14   Corwin Levins, MD  magnesium oxide (MAG-OX) 400 MG tablet Take 1 tablet (400 mg total) by mouth 2 (two) times daily. 03/07/14   Aundria Rud, NP  metolazone (ZAROXOLYN) 2.5 MG tablet Take 1 tablet (2.5 mg total) by mouth once a week. 06/13/14   Zannie Cove, MD  milrinone [facility]) 20 MG/100ML SOLN infusion Inject 83.65 mcg/min into the vein continuous. 06/19/14   Dolores Patty, MD  oxyCODONE-acetaminophen (PERCOCET/ROXICET) 5-325 MG per tablet Take 1 tablet by mouth every 6 (six) hours as needed for severe pain. 05/23/14   Jamesetta Orleans Lawyer, PA-C  oxyCODONE-acetaminophen (PERCOCET/ROXICET) 5-325 MG per tablet Take 1 tablet by mouth every 6 (six) hours as needed for moderate pain or severe pain. 06/20/14   Shon Baton, MD  pantoprazole (PROTONIX) 40 MG tablet Take 1 tablet (40 mg total) by mouth daily. 05/18/14   Aundria Rud, NP  potassium chloride SA (K-DUR,KLOR-CON) 10 MEQ tablet Take 8 tablets (80 mEq total) by mouth 2 (two) times daily. 06/19/14   Dolores Patty, MD  promethazine (PHENERGAN) 25 MG tablet Take 1 tablet (25 mg total) by mouth every 6 (six) hours  as needed for nausea or vomiting. 05/23/14   Jamesetta Orleans Lawyer, PA-C   BP 106/67  Pulse 88  Temp(Src) 98.2 F (36.8 C) (Oral)  Resp 22  SpO2 97% Physical Exam  Nursing note and vitals reviewed. Constitutional: He is oriented to person, place, and time.  Morbidly obese, nasal cannula in place  HENT:  Head: Normocephalic and atraumatic.  Eyes:  Mild icterus noted  Cardiovascular: Regular rhythm and normal heart sounds.   No murmur heard. tachycardia  Pulmonary/Chest: Effort normal. No respiratory distress. He has no wheezes.  Coarse BS  Abdominal: Soft. Bowel sounds are normal. He exhibits no distension and no mass. There is tenderness. There is no rebound and no guarding.  Protuberant, mild tenderness to palpation over the right upper quadrant and epigastrium  Musculoskeletal: He exhibits edema.  Neurological: He is alert  and oriented to person, place, and time.  Skin: Skin is warm and dry.  Psychiatric: He has a normal mood and affect.    ED Course  Procedures (including critical care time) Labs Review Labs Reviewed  CBC WITH DIFFERENTIAL - Abnormal; Notable for the following:    RBC 3.76 (*)    Hemoglobin 8.7 (*)    HCT 27.1 (*)    MCV 72.1 (*)    MCH 23.1 (*)    RDW 18.6 (*)    All other components within normal limits  COMPREHENSIVE METABOLIC PANEL - Abnormal; Notable for the following:    Sodium 133 (*)    Potassium 2.9 (*)    Chloride 91 (*)    Glucose, Bld 127 (*)    BUN 45 (*)    Creatinine, Ser 2.32 (*)    Total Protein 8.7 (*)    AST 51 (*)    Alkaline Phosphatase 170 (*)    Total Bilirubin 4.3 (*)    GFR calc non Af Amer 35 (*)    GFR calc Af Amer 40 (*)    All other components within normal limits  PRO B NATRIURETIC PEPTIDE - Abnormal; Notable for the following:    Pro B Natriuretic peptide (BNP) 1987.0 (*)    All other components within normal limits  TROPONIN I  URINALYSIS, ROUTINE W REFLEX MICROSCOPIC  I-STAT CG4 LACTIC ACID, ED    Imaging  Review Dg Chest Port 1 View  06/19/2014   CLINICAL DATA:  Epigastric pain and nausea for several hr  EXAM: PORTABLE CHEST - 1 VIEW  COMPARISON:  06/17/2014  FINDINGS: There is a right arm PICC line with tip in the projection of the distal SVC. Moderate cardiac enlargement is noted. No pleural effusion or edema. No airspace consolidation.  IMPRESSION: 1. Cardiac enlargement. 2. No acute findings.   Electronically Signed   By: Signa Kellaylor  Stroud M.D.   On: 06/19/2014 22:41   [place] Abdomen Limited Ruq  06/20/2014   CLINICAL DATA:  Epigastric pain.  EXAM: US ABDOMEN LIMITED - RIGHT UPPER QUADRANT  COMPARISON:  None.  FINDINGS: Gallbladder:  No gallstones or wall thickening visualized. No sonographic Murphy sign noted.  Common bile duct:  Diameter: 3.9 mm.  Liver:  No focal lesion identified. Within normal limits in parenchymal echogenicity. The liver measures 24.3 cm in length. Trace amount of perihepatic ascites noted.  IMPRESSION: 1. No acute findings. 2. Trace perihepatic ascites.   Electronically Signed   By: Signa Kellaylor  Stroud M.D.   On: 06/20/2014 00:36     EKG Interpretation None      MDM   Final diagnoses:  Generalized abdominal pain  Hyperbilirubinemia  Hypokalemia    Patient presents with abdominal pain. Pain is acute on chronic. Workup approximately one month ago with CT abdomen and ultrasound was negative. While nontoxic-appearing thin, patient is chronically ill-appearing. He is at his baseline home O2. Labwork obtained and notable for potassium of 2.9 and bilirubin of 4.3 up from 3.9 one month ago.  Repeat right upper quadrant ultrasound obtained and is largely unrevealing with exception of small amount of ascites. Lactate is normal. Patient's potassium was repleted. He was given pain medication and nausea medication with improvement of his symptoms. Discussed the patient admission for further workup given persistently elevated bilirubin and alkaline phosphatase. Patient states "I cannot be  admitted because of the dangerous place and I need to be home with my wife."  He remains nontoxic. He is able to tolerate  by mouth. Given the acute on chronic nature of his pain and otherwise reassuring workup with the exception of elevated bilirubin, will discharge patient home. Have encouraged the patient to call his GI doctor to try to get in earlier to see him. Patient stated understanding. He was given strict return precautions.  After history, exam, and medical workup I feel the patient has been appropriately medically screened and is safe for discharge home. Pertinent diagnoses were discussed with the patient. Patient was given return precautions.     Shon Baton, MD 06/20/14 [phone]

## 2014-06-19 NOTE — ED Notes (Signed)
Cresenciano Genre, from Lab Reports: Ciritcal Potassium 2.9 Dr. Rhunette Croft made aware.

## 2014-06-19 NOTE — Telephone Encounter (Signed)
Received call from [facility] stating that they didin't received orders for the oxygen. She stated its been over a week that form was fax. Didn't see fax on your desk Mrs. Robin. Pls check status and call Yvonna Alanis and inform her whats going on...Raechel Chute

## 2014-06-19 NOTE — ED Notes (Signed)
Spoke with Netta Corrigan IV Team RN regarding Midline PICC double Lumen with Milrinone, states OK to access other lumen.

## 2014-06-19 NOTE — Patient Instructions (Signed)
Change Potassium to 10 meq tabs, take 8 tabs (80 meq) Twice daily   Will have [facility] increase Milrinone and draw labs on Thursday  Your physician recommends that you schedule a follow-up appointment in: 1 week

## 2014-06-19 NOTE — ED Notes (Signed)
X-ray at bedside

## 2014-06-19 NOTE — Progress Notes (Signed)
Patient ID: Chrisandra Carota., male   DOB: 08/02/1976, 38 y.o.   MRN: [id number]  PCP: Dr. Jonny Ruiz  HPI: Mr. Dauphine is a 38 yo male with a history of NICM (nor cors Oct 2014), chronic systolic HF - EF 40%, LBBB, morbid obesity, OSA, HTN, CKD and HLD.   Admitted 1/13-1/18/15 for A/C HF. He was placed on IV lasix gtt and milrinone, however signed out AMA. During his admission his weight decreased from 405 to 368 lbs. ECHO showed EF 20% with diff HK, grade II DD, mod pulm HTN and mod sev LAE. His Cr bumped to 1.56. CO-OX when he left AMA was 35% on 01/08/14.   Readmitted 01/11/14 after he had syncopal episode.  Diuresed again with lasix drip and metolazone. He was transitioned to bumex 4 mg twice a day. He was not be placed on Ace/Spiro due to CKD. He is not currently LVAD/transplant candidate due to obesity, noncompliance, RV dysfunction, and CKD. Discharged on home Milrinone at 0.375 mcg with [facility] to follow. Discharge weight 357 pounds.   Admitted 4/9-4/11/15 for perirectal abscess which he had I/D and was discharged. His Cr increased and was readmitted 4/20 for CP and volume overload after reduction in milrinone to 0.25. It was increased back to 0.375, however had 30 beat run VT and LifeVest was arranged (not compliant with Lifevest). Weight on discharge was 348 lbs (04/15/14). Presented back to hospital 5/2 and was admitted again for CP. He had been out of his medications for 1 week. Diuresed and discharged 04/24/14 at weight of 343 lbs.  His hemoglobin was low and he got 2 units of PRBCs.  He has been started on amiodarone for frequent PVCs. He has been told that he is not a VAD candidate here due to noncompliance and ongoing difficulty getting along with staff members.  Admitted 6/13-6/23 with recurrent HF and worsening renal insufficiency. Did not diurese well on IV lasix so dopamine added. Initial CVP ~ 36. Had to leave on 6/23 before all fluid was removed. CVP was still in low 20s.  Admit weight was 368 and  left at 331 lbs. Seen in ER yesterday for joint pain. Creatinine in hospital was 2.3 on d/c was 1.98. Yesterday was 2.78. Off dopamine co-ox was 48-50% despite milrinone 0.375  Echo 4/15 EF 35% (?)  - very poor windows Echo 06/05/14 EF 15% Moderate RV dysfunction   Follow up: Continues on milrinone. Feels OK. Gets SOB and fatigued easily. He can walk about 50 feet before becoming dyspneic. Weight stable. Taking bumex 2.5 bid. Saw Dr. Graciela Husbands who was willing to consider CRT-D but wasn't sure he would benefit from it due atypical LBBB. Compliant with meds. No dizziness.    Labs 01/15/14 K 3.3 Creatinine 1.75  Labs 01/16/14 K 3.5 Creatinine 1.65  Labs 3/15 K 2.9 => 3.7, creatinine 1.34 => 1.36, HCT 33.5 Labs 04/2014: K+ 3.5 and creatinine 1.46, Troponin 0.30, pro-BNP 1483, Hgb 8.6 Labs 05/01/14 Cr 1.43 Labs 6/15 K 3.3, creatinine 1.48, HCT 29 Labs 06/17/14  K 3.2 Cr 2.78 Hgb 8.6  ECG: NSR, LBBB with QRS 144 msec   FH: No CHF, no SCD that he knows of.  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  Moved from [place].   ROS: All systems negative except as listed in HPI, PMH and Problem List.  PMH: 1. Nonischemic cardiomyopathy: Initial workup in [place].  LHC (10/14) with normal coronaries.  Echo (1/15) with EF 20%, mild diffuse hypokinesis, moderately dilated LV, moderately dilated RV with moderately decreased systolic function, PA systolic pressure 59 mmHg.  1/15 low output heart failure, milrinone begun.  Echo (3/15) with EF 35% (but very technically difficult).  2. Morbid obesity 3. OHS/OSA on Bipap.  4. HTN 5. Hyperlipidemia 6. CKD: Suspect cardiorenal syndrome 7. Syncope: Vasovagal in setting of vomiting 8. Type II diabetes: Borderline.  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 10. Anemia   Current Outpatient Prescriptions  Medication Sig Dispense Refill  . albuterol (PROVENTIL HFA;VENTOLIN HFA) 108 (90 BASE) MCG/ACT inhaler Inhale 2  puffs into the lungs daily as needed for wheezing or shortness of breath.      Marland Kitchen. amiodarone (PACERONE) 400 MG tablet Take 0.5 tablets (200 mg total) by mouth 2 (two) times daily.  60 tablet  2  . aspirin EC 81 MG tablet Take 81 mg by mouth daily.      Marland Kitchen. atorvastatin (LIPITOR) 80 MG tablet Take 80 mg by mouth daily.      . bumetanide (BUMEX) 2 MG tablet Take 2.5 tablets (5 mg total) by mouth 2 (two) times daily.  120 tablet  6  . carvedilol (COREG) 3.125 MG tablet Take 1 tablet (3.125 mg total) by mouth 2 (two) times daily.  60 tablet  3  . hydrALAZINE (APRESOLINE) 25 MG tablet Take 1 tablet (25 mg total) by mouth every 8 (eight) hours.  90 tablet  6  . HYDROcodone-acetaminophen (NORCO) 5-325 MG per tablet Take 2 tablets by mouth every 4 (four) hours as needed.  10 tablet  0  . insulin aspart (NOVOLOG FLEXPEN) 100 UNIT/ML FlexPen Inject 1-15 Units into the skin 3 (three) times daily with meals. Sliding scale  15 mL  11  . insulin glargine (LANTUS) 100 UNIT/ML injection Inject 0.2 mLs (20 Units total) into the skin at bedtime.      . isosorbide mononitrate (IMDUR) 60 MG 24 hr tablet Take 1 tablet (60 mg total) by mouth daily.  30 tablet  3  . loratadine (CLARITIN) 10 MG tablet Take 10 mg by mouth daily as needed.      . magnesium oxide (MAG-OX) 400 MG tablet Take 1 tablet (400 mg total) by mouth 2 (two) times daily.  60 tablet  3  . metolazone (ZAROXOLYN) 2.5 MG tablet Take 1 tablet (2.5 mg total) by mouth once a week.  6 tablet  0  . milrinone (PRIMACOR) 20 MG/100ML SOLN infusion Inject 62.7375 mcg/min into the vein continuous.  100 mL  6  . oxyCODONE-acetaminophen (PERCOCET/ROXICET) 5-325 MG per tablet Take 1 tablet by mouth every 6 (six) hours as needed for severe pain.  15 tablet  0  . pantoprazole (PROTONIX) 40 MG tablet Take 1 tablet (40 mg total) by mouth daily.  30 tablet  3  . potassium chloride SA (K-DUR,KLOR-CON) 20 MEQ tablet Take 3 tablets (60 mEq total) by mouth 2 (two) times daily.  120  tablet  3  . promethazine (PHENERGAN) 25 MG tablet Take 1 tablet (25 mg total) by mouth every 6 (six) hours as needed for nausea or vomiting.  10 tablet  0   No current facility-administered medications for this encounter.    Filed Vitals:   06/19/14 1220  BP: 110/70  Pulse: 103  Weight: 334 lb 12.8 oz (151.864 kg)  SpO2: 96%    PHYSICAL EXAM: General:  Stable appearing. No resp difficulty; wife present HEENT: normal Neck: Thick. JVP difficult to  assess d/t body habitus but appears 10+   Carotids 2+ bilaterally; no bruits. No lymphadenopathy or thryomegaly appreciated. Cor: PMI nonpalpable. Tachycardic Regular rate & rhythm.  + S3 noted.  Lungs: clear Abdomen: obese soft, nontender, nondistended.Good bowel sounds. Extremities: no cyanosis, clubbing, rash. LUE double lumen PICC. Chronic venous stasis changes. No gross edema.  Neuro: alert & orientedx3, cranial nerves grossly intact. Moves all 4 extremities w/o difficulty. Affect pleasant.  ASSESSMENT & PLAN:  1. Chronic Systolic Heart Failure: Nonischemic cardiomyopathy with LBBB. EF 15% by echo on 05/2014 with moderate RV dysfunction. Failed milrinone wean in past. He has been felt not to be a candidate for VAD here due to noncompliance and difficulty working with our staff.  - He is clearly getting worse. During last admit struggled with severe cardiorenal syndrome and now has progressive renal failure despite milrinone support. We discussed the fact that he is now at a critical point in his illness and is running out of options. I told him that I felt the best option would likely be to get admitted and supported with IABP or additional inotropes to see if we could improve his renal function and then consider high-risk VAD at either [facility] or [facility]. He is unwilling to do this due to transportation issues and fact that he is hopeful that he can get through this. He asked about CRT-D and I said that with the severity of his HF and atypical LBBB  that I did not think CRT would be sufficient to help him. We will increase his milrinone to 0.5 mcg/kg/min and recheck kidney function on Thursday. If worse, he will consider a more aggressive approach. I reinforced that increasing milrinone will increase risk of VT/VF and he should wear LifeVest consistently - which he has not been doing. He is aware that he is at high risk for dying from his HF in near future. No ACE/ARB due to renal failure. Increase Kcl to 80 bid.  2. OSA/OHS:  continue nightly BiPap, will try to arrange adding oxygen.  3. CKD:  As above. Continues to worsen. Suspect cardiorenal syndrome primarily.  4. DM2: Followed by PCP  5. PVCs: Frequent PVCs, ?contribution to cardiomyopathy.  Therefore, now on amiodarone.   6. Anemia: pending GI eval  Total time spent 45 minutes. Over half that time spent discussing above.    Daniel Bensimhon,MD 12:49 PM

## 2014-06-20 ENCOUNTER — Telehealth: Payer: Self-pay | Admitting: *Deleted

## 2014-06-20 ENCOUNTER — Emergency Department (HOSPITAL_COMMUNITY): Payer: Medicare Other

## 2014-06-20 ENCOUNTER — Inpatient Hospital Stay: Payer: Medicare Other | Admitting: Internal Medicine

## 2014-06-20 DIAGNOSIS — R1084 Generalized abdominal pain: Secondary | ICD-10-CM | POA: Diagnosis not present

## 2014-06-20 DIAGNOSIS — E119 Type 2 diabetes mellitus without complications: Secondary | ICD-10-CM

## 2014-06-20 LAB — TYPE AND SCREEN
ABO/RH(D): O POS
Antibody Screen: NEGATIVE
UNIT DIVISION: 0
Unit division: 0

## 2014-06-20 MED ORDER — HEPARIN SOD (PORK) LOCK FLUSH 100 UNIT/ML IV SOLN
250.0000 [IU] | Freq: Once | INTRAVENOUS | Status: AC
  Administered 2014-06-20: 250 [IU]

## 2014-06-20 MED ORDER — OXYCODONE-ACETAMINOPHEN 5-325 MG PO TABS: 1.0000 | ORAL_TABLET | Freq: Four times a day (QID) | ORAL | Status: AC | PRN

## 2014-06-20 MED ORDER — OXYCODONE-ACETAMINOPHEN 5-325 MG PO TABS
1.0000 | ORAL_TABLET | Freq: Once | ORAL | Status: AC
  Administered 2014-06-20: 1 via ORAL
  Filled 2014-06-20: qty 1

## 2014-06-20 NOTE — Telephone Encounter (Signed)
Called [facility] left detailed message oxygen home health order put in on Friday 06/16/14 by PCP.

## 2014-06-20 NOTE — ED Notes (Signed)
Alert, NAD, calm, interactive, resps e/u, speaking in clear complete sentences. Pain returning.

## 2014-06-20 NOTE — Telephone Encounter (Signed)
Attempted to call patient but unable to leave message due to the "mailbox is full".  Lipid panel ordered.  Patient is coming in for an upcoming appointment.

## 2014-06-20 NOTE — Discharge Instructions (Signed)
Abdominal Pain Many things can cause abdominal pain. Usually, abdominal pain is not caused by a disease and will improve without treatment. It can often be observed and treated at home. Your health care provider will do a physical exam and possibly order blood tests and X-rays to help determine the seriousness of your pain. However, in many cases, more time must pass before a clear cause of the pain can be found. Before that point, your health care provider may not know if you need more testing or further treatment.  Your bilirubin is also elevated. HOME CARE INSTRUCTIONS  Monitor your abdominal pain for any changes. The following actions may help to alleviate any discomfort you are experiencing:  Only take over-the-counter or prescription medicines as directed by your health care provider.  Do not take laxatives unless directed to do so by your health care provider.  Try a clear liquid diet (broth, tea, or water) as directed by your health care provider. Slowly move to a bland diet as tolerated. SEEK MEDICAL CARE IF:  You have unexplained abdominal pain.  You have abdominal pain associated with nausea or diarrhea.  You have pain when you urinate or have a bowel movement.  You experience abdominal pain that wakes you in the night.  You have abdominal pain that is worsened or improved by eating food.  You have abdominal pain that is worsened with eating fatty foods.  You have a fever. SEEK IMMEDIATE MEDICAL CARE IF:   Your pain does not go away within 2 hours.  You keep throwing up (vomiting).  Your pain is felt only in portions of the abdomen, such as the right side or the left lower portion of the abdomen.  You pass bloody or black tarry stools. MAKE SURE YOU:  Understand these instructions.   Will watch your condition.   Will get help right away if you are not doing well or get worse.  Document Released: 09/17/2005 Document Revised: 12/13/2013 Document Reviewed:  08/17/2013 [facility] Patient Information 2015 [place], [place]. This information is not intended to replace advice given to you by your health care provider. Make sure you discuss any questions you have with your health care provider.  Hypokalemia Hypokalemia means that the amount of potassium in the blood is lower than normal.Potassium is a chemical, called an electrolyte, that helps regulate the amount of fluid in the body. It also stimulates muscle contraction and helps nerves function properly.Most of the body's potassium is inside of cells, and only a very small amount is in the blood. Because the amount in the blood is so small, minor changes can be life-threatening. CAUSES  Antibiotics.  Diarrhea or vomiting.  Using laxatives too much, which can cause diarrhea.  Chronic kidney disease.  Water pills (diuretics).  Eating disorders (bulimia).  Low magnesium level.  Sweating a lot. SIGNS AND SYMPTOMS  Weakness.  Constipation.  Fatigue.  Muscle cramps.  Mental confusion.  Skipped heartbeats or irregular heartbeat (palpitations).  Tingling or numbness. DIAGNOSIS  Your health care provider can diagnose hypokalemia with blood tests. In addition to checking your potassium level, your health care provider may also check other lab tests. TREATMENT Hypokalemia can be treated with potassium supplements taken by mouth or adjustments in your current medicines. If your potassium level is very low, you may need to get potassium through a vein (IV) and be monitored in the hospital. A diet high in potassium is also helpful. Foods high in potassium are:  Nuts, such as peanuts and  pistachios.  Seeds, such as sunflower seeds and pumpkin seeds.  Peas, lentils, and lima beans.  Whole grain and bran cereals and breads.  Fresh fruit and vegetables, such as apricots, avocado, bananas, cantaloupe, kiwi, oranges, tomatoes, asparagus, and potatoes.  Orange and tomato juices.  Red  meats.  Fruit yogurt. HOME CARE INSTRUCTIONS  Take all medicines as prescribed by your health care provider.  Maintain a healthy diet by including nutritious food, such as fruits, vegetables, nuts, whole grains, and lean meats.  If you are taking a laxative, be sure to follow the directions on the label. SEEK MEDICAL CARE IF:  Your weakness gets worse.  You feel your heart pounding or racing.  You are vomiting or having diarrhea.  You are diabetic and having trouble keeping your blood glucose in the normal range. SEEK IMMEDIATE MEDICAL CARE IF:  You have chest pain, shortness of breath, or dizziness.  You are vomiting or having diarrhea for more than 2 days.  You faint. MAKE SURE YOU:   Understand these instructions.  Will watch your condition.  Will get help right away if you are not doing well or get worse. Document Released: 12/08/2005 Document Revised: 09/28/2013 Document Reviewed: 06/10/2013 [facility] Patient Information 2015 [place], [place]. This information is not intended to replace advice given to you by your health care provider. Make sure you discuss any questions you have with your health care provider.

## 2014-06-21 ENCOUNTER — Telehealth (HOSPITAL_COMMUNITY): Payer: Self-pay | Admitting: Anesthesiology

## 2014-06-21 NOTE — Telephone Encounter (Signed)
Reviewed patients labs. K+ 3.5 and Creatinine 2.51. Take an extra 40 meq of potassium today. Phone busy and VM full. Will try to call back.

## 2014-06-22 ENCOUNTER — Telehealth (HOSPITAL_COMMUNITY): Payer: Self-pay | Admitting: Vascular Surgery

## 2014-06-22 NOTE — Telephone Encounter (Signed)
[facility] .Marland Kitchen. message wanting to get some orders clarified for the pt. Please advise

## 2014-06-22 NOTE — Telephone Encounter (Signed)
Orders sent to have milranone increased and labs repeated x 3 days (thursday 06/22/14) [facility] was unable to change bag until 6/30 so labs ok to draw on 7/3 Verbal given to [facility]

## 2014-06-23 ENCOUNTER — Telehealth: Payer: Self-pay | Admitting: Physician Assistant

## 2014-06-23 NOTE — Telephone Encounter (Addendum)
Pt called holiday answering service. He is having significant abdominal pain requiring pain medicine along with nausea/vomiting today. The nausea and vomiting preceded taking the pain med. He has complex medical history including CHF on milrinone, renal failure, recent ED visit with anemia down to ? 6.1 and + FOBT (repeat Hgb 8.7 4 days ago), borderline lactic acid level - I told him given complexity of the picture here I do not feel comfortable simply calling med in in case there is more serious issue going on. He said he does not see GI until August. I advised since he is having such significant pain that he is requiring pain medicine as well as nausea/vomiting that he seek care again in the ER. He was persistent that I should call something in but I again reiterated that I do not feel comfortable doing so since there are a number of things his problems could be related to (worsening renal failure, bleeding, hemodynamic compromise), and I worry that we are simply covering up an underlying issue. I reiterated my recommendation to seek medical attention and he verbalized understanding. I told him another option would be to call PCP - he said he cant because they are closed today. I told him that they should have someone on-call (as we do given that we are closed) that may be able to help offer further insight. Dayna Dunn PA-C

## 2014-06-26 ENCOUNTER — Ambulatory Visit (HOSPITAL_COMMUNITY)
Admission: RE | Admit: 2014-06-26 | Discharge: 2014-06-26 | Disposition: A | Discharge status: Home / Self Care | Payer: Medicare Other | Source: Ambulatory Visit | Attending: Internal Medicine | Admitting: Internal Medicine

## 2014-06-26 VITALS — BP 100/58 | HR 88 | Wt 341.5 lb

## 2014-06-26 DIAGNOSIS — E119 Type 2 diabetes mellitus without complications: Secondary | ICD-10-CM | POA: Insufficient documentation

## 2014-06-26 DIAGNOSIS — E785 Hyperlipidemia, unspecified: Secondary | ICD-10-CM | POA: Insufficient documentation

## 2014-06-26 DIAGNOSIS — G4733 Obstructive sleep apnea (adult) (pediatric): Secondary | ICD-10-CM | POA: Diagnosis not present

## 2014-06-26 DIAGNOSIS — Z794 Long term (current) use of insulin: Secondary | ICD-10-CM | POA: Diagnosis not present

## 2014-06-26 DIAGNOSIS — Z7982 Long term (current) use of aspirin: Secondary | ICD-10-CM | POA: Diagnosis not present

## 2014-06-26 DIAGNOSIS — I428 Other cardiomyopathies: Secondary | ICD-10-CM | POA: Diagnosis not present

## 2014-06-26 DIAGNOSIS — N183 Chronic kidney disease, stage 3 unspecified: Secondary | ICD-10-CM | POA: Diagnosis not present

## 2014-06-26 DIAGNOSIS — I5022 Chronic systolic (congestive) heart failure: Secondary | ICD-10-CM | POA: Insufficient documentation

## 2014-06-26 DIAGNOSIS — I509 Heart failure, unspecified: Secondary | ICD-10-CM | POA: Insufficient documentation

## 2014-06-26 DIAGNOSIS — I4949 Other premature depolarization: Secondary | ICD-10-CM | POA: Insufficient documentation

## 2014-06-26 DIAGNOSIS — N189 Chronic kidney disease, unspecified: Secondary | ICD-10-CM | POA: Diagnosis not present

## 2014-06-26 DIAGNOSIS — D649 Anemia, unspecified: Secondary | ICD-10-CM | POA: Insufficient documentation

## 2014-06-26 NOTE — Progress Notes (Signed)
Patient ID: Steven Carota., male   DOB: 10-08-1976, 38 y.o.   MRN: [id number]  PCP: Dr. Jonny Ruiz  HPI: [name] is a 38 yo male with a history of NICM (nor cors Oct 2014), chronic systolic HF - EF 40%, LBBB, morbid obesity, OSA, HTN, CKD and HLD.   Admitted 1/13-1/18/15 for A/C HF. He was placed on IV lasix gtt and milrinone, however signed out AMA. During his admission his weight decreased from 405 to 368 lbs. ECHO showed EF 20% with diff HK, grade II DD, mod pulm HTN and mod sev LAE. His Cr bumped to 1.56. CO-OX when he left AMA was 35% on 01/08/14.   Readmitted 01/11/14 after he had syncopal episode.  Diuresed again with lasix drip and metolazone. He was transitioned to bumex 4 mg twice a day. He was not be placed on Ace/Spiro due to CKD. He is not currently LVAD/transplant candidate due to obesity, noncompliance, RV dysfunction, and CKD. Discharged on home Milrinone at 0.375 mcg with [facility] to follow. Discharge weight 357 pounds.   Admitted 4/9-4/11/15 for perirectal abscess which he had I/D and was discharged. His Cr increased and was readmitted 4/20 for CP and volume overload after reduction in milrinone to 0.25. It was increased back to 0.375, however had 30 beat run VT and LifeVest was arranged (not compliant with Lifevest). Weight on discharge was 348 lbs (04/15/14). Presented back to hospital 5/2 and was admitted again for CP. He had been out of his medications for 1 week. Diuresed and discharged 04/24/14 at weight of 343 lbs.  His hemoglobin was low and he got 2 units of PRBCs.  He has been started on amiodarone for frequent PVCs. He has been told that he is not a VAD candidate here due to noncompliance and ongoing difficulty getting along with staff members.  Admitted 6/13-6/23 with recurrent HF and worsening renal insufficiency. Did not diurese well on IV lasix so dopamine added. Initial CVP ~ 36. Had to leave on 6/23 before all fluid was removed. CVP was still in low 20s.  Admit weight was 368 and  left at 331 lbs. Seen in ER yesterday for joint pain. Creatinine in hospital was 2.3 on d/c was 1.98. Yesterday was 2.78. Off dopamine co-ox was 48-50% despite milrinone 0.375  Echo 4/15 EF 35% (?)  - very poor windows Echo 06/05/14 EF 15% Moderate RV dysfunction   Follow up: At last visit milrinone increased to 0.5 and kcl increased to 80 bid. Feels better. Weight stable at home at 338. Breathing fine. Less CP. No edema. Doing HHPT. Renal function better.    Labs 01/15/14 K 3.3 Creatinine 1.75  Labs 01/16/14 K 3.5 Creatinine 1.65  Labs 3/15 K 2.9 => 3.7, creatinine 1.34 => 1.36, HCT 33.5 Labs 04/2014: K+ 3.5 and creatinine 1.46, Troponin 0.30, pro-BNP 1483, Hgb 8.6 Labs 05/01/14 Cr 1.43 Labs 6/15 K 3.3, creatinine 1.48, HCT 29 Labs 06/17/14  K 3.2 Cr 2.78 Hgb 8.6 Labs 06/23/2014 K 3.8 Cr 2.29  ECG: NSR, LBBB with QRS 144 msec   FH: No CHF, no SCD that he knows of.  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  Moved from [place].   ROS: All systems negative except as listed in HPI, PMH and Problem List.  PMH: 1. Nonischemic cardiomyopathy: Initial workup in [place].  LHC (10/14) with normal coronaries.  Echo (1/15) with EF 20%, mild diffuse hypokinesis, moderately dilated LV, moderately  dilated RV with moderately decreased systolic function, PA systolic pressure 59 mmHg.  1/15 low output heart failure, milrinone begun.  Echo (3/15) with EF 35% (but very technically difficult).  2. Morbid obesity 3. OHS/OSA on Bipap.  4. HTN 5. Hyperlipidemia 6. CKD: Suspect cardiorenal syndrome 7. Syncope: Vasovagal in setting of vomiting 8. Type II diabetes: Borderline.  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 10. Anemia   Current Outpatient Prescriptions  Medication Sig Dispense Refill  . albuterol (PROVENTIL HFA;VENTOLIN HFA) 108 (90 BASE) MCG/ACT inhaler Inhale 2 puffs into the lungs daily as needed for wheezing or shortness of breath.      Marland Kitchen  amiodarone (PACERONE) 400 MG tablet Take 0.5 tablets (200 mg total) by mouth 2 (two) times daily.  60 tablet  2  . aspirin EC 81 MG tablet Take 81 mg by mouth daily.      Marland Kitchen atorvastatin (LIPITOR) 80 MG tablet Take 80 mg by mouth daily.      . bumetanide (BUMEX) 2 MG tablet Take 2.5 tablets (5 mg total) by mouth 2 (two) times daily.  120 tablet  6  . carvedilol (COREG) 3.125 MG tablet Take 1 tablet (3.125 mg total) by mouth 2 (two) times daily.  60 tablet  3  . hydrALAZINE (APRESOLINE) 25 MG tablet Take 1 tablet (25 mg total) by mouth every 8 (eight) hours.  90 tablet  6  . HYDROcodone-acetaminophen (NORCO) 5-325 MG per tablet Take 2 tablets by mouth every 4 (four) hours as needed.  10 tablet  0  . insulin aspart (NOVOLOG FLEXPEN) 100 UNIT/ML FlexPen Inject 1-15 Units into the skin 3 (three) times daily with meals. Sliding scale  15 mL  11  . insulin glargine (LANTUS) 100 UNIT/ML injection Inject 0.2 mLs (20 Units total) into the skin at bedtime.      . isosorbide mononitrate (IMDUR) 60 MG 24 hr tablet Take 1 tablet (60 mg total) by mouth daily.  30 tablet  3  . loratadine (CLARITIN) 10 MG tablet Take 10 mg by mouth daily as needed.      . magnesium oxide (MAG-OX) 400 MG tablet Take 1 tablet (400 mg total) by mouth 2 (two) times daily.  60 tablet  3  . metolazone (ZAROXOLYN) 2.5 MG tablet Take 1 tablet (2.5 mg total) by mouth once a week.  6 tablet  0  . milrinone (PRIMACOR) 20 MG/100ML SOLN infusion Inject 83.65 mcg/min into the vein continuous.  100 mL  6  . oxyCODONE-acetaminophen (PERCOCET/ROXICET) 5-325 MG per tablet Take 1 tablet by mouth every 6 (six) hours as needed for moderate pain or severe pain.  15 tablet  0  . pantoprazole (PROTONIX) 40 MG tablet Take 1 tablet (40 mg total) by mouth daily.  30 tablet  3  . potassium chloride SA (K-DUR,KLOR-CON) 10 MEQ tablet Take 8 tablets (80 mEq total) by mouth 2 (two) times daily.  480 tablet  3  . promethazine (PHENERGAN) 25 MG tablet Take 1 tablet  (25 mg total) by mouth every 6 (six) hours as needed for nausea or vomiting.  10 tablet  0   No current facility-administered medications for this encounter.    Filed Vitals:   06/26/14 0952  BP: 100/58  Pulse: 88  Weight: 341 lb 8 oz (154.903 kg)  SpO2: 97%    PHYSICAL EXAM: General:  Stable appearing. No resp difficulty; wife present HEENT: normal Neck: Thick. JVP difficult to assess d/t body habitus but appears ~10  Carotids 2+ bilaterally; no bruits. No lymphadenopathy or thryomegaly appreciated. Cor: PMI nonpalpable. Regular rate & rhythm.  + S3 noted.  Lungs: clear Abdomen: obese soft, nontender, nondistended.Good bowel sounds. Extremities: no cyanosis, clubbing, rash. LUE double lumen PICC. Chronic venous stasis changes. No gross edema.  Neuro: alert & orientedx3, cranial nerves grossly intact. Moves all 4 extremities w/o difficulty. Affect pleasant.  ASSESSMENT & PLAN:  1. Chronic Systolic Heart Failure: Nonischemic cardiomyopathy with LBBB. EF 15% by echo on 05/2014 with moderate RV dysfunction. Failed milrinone wean in past. He has been felt not to be a candidate for VAD here due to noncompliance and difficulty working with our staff.  - He is improved on higher dose milrinone (0.5 mcg/kg/min). While this is good to see, I told him the benefit may be short-lived. We discussed the options and he is=wanting to proceed with CRT-D to see if this helps stabilize his HF and renal function. He currently is not candidate for VAD or transplant due to social situation and renal failure. I have discussed with Dr. Graciela HusbandsKlein who will arrange.  2. OSA/OHS:  continue nightly BiPap, will try to arrange adding oxygen.  3. CKD:  As above. Slightly improved on higher dose milrinone.  4. DM2: Followed by PCP  5. PVCs: Improved on amiodarone.   6. Anemia: pending GI eval   Dory Demont,MD 10:42 AM

## 2014-06-26 NOTE — Patient Instructions (Signed)
Your physician recommends that you schedule a follow-up appointment in: 1 month  

## 2014-06-27 ENCOUNTER — Ambulatory Visit: Payer: Medicare Other | Admitting: Internal Medicine

## 2014-06-27 ENCOUNTER — Encounter: Payer: Self-pay | Admitting: *Deleted

## 2014-06-27 ENCOUNTER — Other Ambulatory Visit: Payer: Self-pay | Admitting: *Deleted

## 2014-06-27 DIAGNOSIS — Z01812 Encounter for preprocedural laboratory examination: Secondary | ICD-10-CM

## 2014-06-27 DIAGNOSIS — I5022 Chronic systolic (congestive) heart failure: Secondary | ICD-10-CM

## 2014-06-27 DIAGNOSIS — I428 Other cardiomyopathies: Secondary | ICD-10-CM

## 2014-06-28 ENCOUNTER — Telehealth (HOSPITAL_COMMUNITY): Payer: Self-pay | Admitting: Vascular Surgery

## 2014-06-28 ENCOUNTER — Other Ambulatory Visit (HOSPITAL_COMMUNITY): Payer: Self-pay | Admitting: Cardiology

## 2014-06-28 DIAGNOSIS — I509 Heart failure, unspecified: Secondary | ICD-10-CM

## 2014-06-28 NOTE — Telephone Encounter (Signed)
Nurse from [facility] left message stating no blood return out of PICC LINE yesterday. She stuck him twice, and the pt wants to  wait until Thursday when he see the doctor. Yvonna Alanis would like a call back to make sure that is ok

## 2014-06-28 NOTE — Telephone Encounter (Signed)
Spoke w/Steven Wyatt earlier pt's line is working and infusing medication, pt is sch w/pcp this week not with [place], she will let me know next week if PICC does not give blood return and we will sch cath flo

## 2014-06-29 ENCOUNTER — Ambulatory Visit: Payer: Medicare Other | Admitting: Internal Medicine

## 2014-06-30 ENCOUNTER — Telehealth: Payer: Self-pay | Admitting: Internal Medicine

## 2014-06-30 NOTE — Telephone Encounter (Signed)
Patient no showed for hospital follow up on 07/09.  Next appt with Dr. Jonny Ruiz is 8/11.  Please advise.

## 2014-06-30 NOTE — Telephone Encounter (Signed)
Ok to see aug 11 as planned unless pt needs earlier appt

## 2014-07-03 ENCOUNTER — Other Ambulatory Visit: Payer: Medicare Other

## 2014-07-04 ENCOUNTER — Encounter: Payer: Self-pay | Admitting: Internal Medicine

## 2014-07-04 ENCOUNTER — Telehealth (HOSPITAL_COMMUNITY): Payer: Self-pay | Admitting: Vascular Surgery

## 2014-07-04 NOTE — Telephone Encounter (Signed)
Physical therapist [facility] wants to speak to the nurse about pt Case and therapy. Please advise

## 2014-07-04 NOTE — Telephone Encounter (Signed)
She states pt is not able to tolerate any type of therapy, she would like to put on hold for now until he gets his ICD on Mon and recovers, gave ok

## 2014-07-05 ENCOUNTER — Telehealth (HOSPITAL_COMMUNITY): Payer: Self-pay

## 2014-07-05 NOTE — Telephone Encounter (Signed)
Lab results reviewed with patient, instructed to take extra 40 meq potassium today.  Aware and agreeable. Steven Wyatt  

## 2014-07-07 ENCOUNTER — Other Ambulatory Visit: Payer: Self-pay | Admitting: *Deleted

## 2014-07-07 ENCOUNTER — Encounter: Payer: Self-pay | Admitting: Internal Medicine

## 2014-07-07 ENCOUNTER — Telehealth: Payer: Self-pay | Admitting: Internal Medicine

## 2014-07-07 ENCOUNTER — Telehealth: Payer: Self-pay | Admitting: *Deleted

## 2014-07-07 MED ORDER — RANITIDINE HCL 150 MG PO TABS: ORAL_TABLET | ORAL | Status: AC

## 2014-07-07 MED ORDER — PREDNISONE 20 MG PO TABS: ORAL_TABLET | ORAL | Status: AC

## 2014-07-07 NOTE — Telephone Encounter (Signed)
Left message: Called patient to discuss medications to take before his surgery secondary to shellfish allergy. Will try and reach him over the weekend to review this. Advised pt to call over weekend to discuss with on call physician (to review orders)

## 2014-07-07 NOTE — Telephone Encounter (Signed)
New message     Talk to [facility] regarding procedure scheduled for monday

## 2014-07-07 NOTE — Telephone Encounter (Signed)
Advised patient procedure for Monday is still on, anesthesia has been scheduled. Patient verbalized understanding and agreeable to plan.

## 2014-07-09 ENCOUNTER — Encounter (HOSPITAL_COMMUNITY): Payer: Self-pay | Admitting: Emergency Medicine

## 2014-07-09 ENCOUNTER — Other Ambulatory Visit: Payer: Self-pay

## 2014-07-09 ENCOUNTER — Telehealth: Payer: Self-pay | Admitting: Cardiology

## 2014-07-09 ENCOUNTER — Emergency Department (HOSPITAL_COMMUNITY)
Admission: EM | Admit: 2014-07-09 | Discharge: 2014-07-10 | Disposition: A | Discharge status: Home / Self Care | Payer: Medicare Other | Source: Home / Self Care | Attending: Emergency Medicine | Admitting: Emergency Medicine

## 2014-07-09 DIAGNOSIS — R17 Unspecified jaundice: Secondary | ICD-10-CM | POA: Insufficient documentation

## 2014-07-09 DIAGNOSIS — D649 Anemia, unspecified: Secondary | ICD-10-CM

## 2014-07-09 DIAGNOSIS — R6510 Systemic inflammatory response syndrome (SIRS) of non-infectious origin without acute organ dysfunction: Secondary | ICD-10-CM | POA: Insufficient documentation

## 2014-07-09 DIAGNOSIS — R1084 Generalized abdominal pain: Secondary | ICD-10-CM

## 2014-07-09 DIAGNOSIS — Z9889 Other specified postprocedural states: Secondary | ICD-10-CM | POA: Insufficient documentation

## 2014-07-09 DIAGNOSIS — Z794 Long term (current) use of insulin: Secondary | ICD-10-CM | POA: Insufficient documentation

## 2014-07-09 DIAGNOSIS — E119 Type 2 diabetes mellitus without complications: Secondary | ICD-10-CM

## 2014-07-09 DIAGNOSIS — G4730 Sleep apnea, unspecified: Secondary | ICD-10-CM

## 2014-07-09 DIAGNOSIS — I129 Hypertensive chronic kidney disease with stage 1 through stage 4 chronic kidney disease, or unspecified chronic kidney disease: Secondary | ICD-10-CM | POA: Insufficient documentation

## 2014-07-09 DIAGNOSIS — Z8619 Personal history of other infectious and parasitic diseases: Secondary | ICD-10-CM | POA: Insufficient documentation

## 2014-07-09 DIAGNOSIS — Z8719 Personal history of other diseases of the digestive system: Secondary | ICD-10-CM | POA: Insufficient documentation

## 2014-07-09 DIAGNOSIS — E785 Hyperlipidemia, unspecified: Secondary | ICD-10-CM

## 2014-07-09 DIAGNOSIS — Z79899 Other long term (current) drug therapy: Secondary | ICD-10-CM | POA: Insufficient documentation

## 2014-07-09 DIAGNOSIS — I5022 Chronic systolic (congestive) heart failure: Secondary | ICD-10-CM | POA: Insufficient documentation

## 2014-07-09 DIAGNOSIS — I509 Heart failure, unspecified: Secondary | ICD-10-CM | POA: Diagnosis not present

## 2014-07-09 DIAGNOSIS — Z9981 Dependence on supplemental oxygen: Secondary | ICD-10-CM

## 2014-07-09 DIAGNOSIS — I428 Other cardiomyopathies: Secondary | ICD-10-CM

## 2014-07-09 DIAGNOSIS — I469 Cardiac arrest, cause unspecified: Secondary | ICD-10-CM | POA: Diagnosis not present

## 2014-07-09 DIAGNOSIS — J45909 Unspecified asthma, uncomplicated: Secondary | ICD-10-CM | POA: Insufficient documentation

## 2014-07-09 DIAGNOSIS — N183 Chronic kidney disease, stage 3 unspecified: Secondary | ICD-10-CM | POA: Insufficient documentation

## 2014-07-09 DIAGNOSIS — Z7982 Long term (current) use of aspirin: Secondary | ICD-10-CM | POA: Insufficient documentation

## 2014-07-09 DIAGNOSIS — Z8659 Personal history of other mental and behavioral disorders: Secondary | ICD-10-CM | POA: Insufficient documentation

## 2014-07-09 DIAGNOSIS — I429 Cardiomyopathy, unspecified: Secondary | ICD-10-CM

## 2014-07-09 DIAGNOSIS — R1011 Right upper quadrant pain: Secondary | ICD-10-CM | POA: Insufficient documentation

## 2014-07-09 DIAGNOSIS — Z9861 Coronary angioplasty status: Secondary | ICD-10-CM | POA: Insufficient documentation

## 2014-07-09 LAB — CBC WITH DIFFERENTIAL/PLATELET
BASOS ABS: 0 10*3/uL (ref 0.0–0.1)
Basophils Relative: 0 % (ref 0–1)
EOS PCT: 3 % (ref 0–5)
Eosinophils Absolute: 0.1 10*3/uL (ref 0.0–0.7)
HCT: 26.8 % — ABNORMAL LOW (ref 39.0–52.0)
Hemoglobin: 8.6 g/dL — ABNORMAL LOW (ref 13.0–17.0)
LYMPHS PCT: 15 % (ref 12–46)
Lymphs Abs: 0.7 10*3/uL (ref 0.7–4.0)
MCH: 22.9 pg — ABNORMAL LOW (ref 26.0–34.0)
MCHC: 32.1 g/dL (ref 30.0–36.0)
MCV: 71.5 fL — AB (ref 78.0–100.0)
Monocytes Absolute: 0.4 10*3/uL (ref 0.1–1.0)
Monocytes Relative: 7 % (ref 3–12)
NEUTROS ABS: 3.6 10*3/uL (ref 1.7–7.7)
Neutrophils Relative %: 75 % (ref 43–77)
PLATELETS: 316 10*3/uL (ref 150–400)
RBC: 3.75 MIL/uL — ABNORMAL LOW (ref 4.22–5.81)
RDW: 19.7 % — ABNORMAL HIGH (ref 11.5–15.5)
WBC: 4.9 10*3/uL (ref 4.0–10.5)

## 2014-07-09 LAB — I-STAT TROPONIN, ED: Troponin i, poc: 0.02 ng/mL (ref 0.00–0.08)

## 2014-07-09 MED ORDER — DEXTROSE 5 % IV SOLN
3.0000 g | INTRAVENOUS | Status: AC
  Administered 2014-07-10: 3 g via INTRAVENOUS
  Filled 2014-07-09: qty 3000

## 2014-07-09 MED ORDER — SODIUM CHLORIDE 0.9 % IR SOLN
80.0000 mg | Status: DC
  Filled 2014-07-09: qty 2

## 2014-07-09 MED ORDER — PREDNISONE 20 MG PO TABS
60.0000 mg | ORAL_TABLET | Freq: Once | ORAL | Status: AC
  Administered 2014-07-09: 60 mg via ORAL
  Filled 2014-07-09: qty 3

## 2014-07-09 MED ORDER — METHYLPREDNISOLONE SODIUM SUCC 125 MG IJ SOLR: 125.0000 mg | Freq: Once | INTRAMUSCULAR | Status: DC

## 2014-07-09 MED ORDER — FAMOTIDINE IN NACL 20-0.9 MG/50ML-% IV SOLN: 20.0000 mg | Freq: Once | INTRAVENOUS | Status: DC

## 2014-07-09 MED ORDER — HYDROMORPHONE HCL PF 1 MG/ML IJ SOLN
2.0000 mg | Freq: Once | INTRAMUSCULAR | Status: AC
  Administered 2014-07-09: 2 mg via INTRAVENOUS
  Filled 2014-07-09: qty 2

## 2014-07-09 MED ORDER — METOCLOPRAMIDE HCL 5 MG/ML IJ SOLN: 10.0000 mg | Freq: Once | INTRAMUSCULAR | Status: DC

## 2014-07-09 MED ORDER — SODIUM CHLORIDE 0.9 % IV SOLN: INTRAVENOUS | Status: DC

## 2014-07-09 MED ORDER — DIPHENHYDRAMINE HCL 50 MG/ML IJ SOLN
25.0000 mg | Freq: Once | INTRAMUSCULAR | Status: AC
  Administered 2014-07-09: 25 mg via INTRAVENOUS
  Filled 2014-07-09: qty 1

## 2014-07-09 MED ORDER — DIPHENHYDRAMINE HCL 50 MG/ML IJ SOLN: 25.0000 mg | Freq: Once | INTRAMUSCULAR | Status: DC

## 2014-07-09 MED ORDER — CHLORHEXIDINE GLUCONATE 4 % EX LIQD
60.0000 mL | Freq: Once | CUTANEOUS | Status: DC
  Filled 2014-07-09: qty 60

## 2014-07-09 MED ORDER — FAMOTIDINE IN NACL 20-0.9 MG/50ML-% IV SOLN
20.0000 mg | Freq: Once | INTRAVENOUS | Status: AC
  Administered 2014-07-09: 20 mg via INTRAVENOUS
  Filled 2014-07-09: qty 50

## 2014-07-09 NOTE — ED Notes (Signed)
Dr. Manly at Bedside. 

## 2014-07-09 NOTE — Telephone Encounter (Signed)
Pt called about CRT implant tomorrow. He is scheduled to be here at 5:30 am and says he was not sure he could get there then. I offered to reschedule his procedure for later that day but he was reluctant to give up the first spot. He called back and said he would be here at 5:30.  Corine Shelter PA-C 07/09/2014 2:17 PM

## 2014-07-09 NOTE — ED Notes (Signed)
Pt arrives via [facility] with c/o abd pain and back pain. PICC line previously placed in R arm. Chronic upper GI issue, will see upper GI doc in August. No N/V/D. Radiating to mid back and lower back. On O2 at home, 3L. Hx CHF

## 2014-07-09 NOTE — ED Provider Notes (Signed)
CSN: [id number]     Arrival date & time 07/09/14  2236 History   First MD Initiated Contact with Patient 07/09/14 2309     Chief Complaint  Patient presents with  . Abdominal Pain  . Back Pain     (Consider location/radiation/quality/duration/timing/severity/associated sxs/prior Treatment) HPI  This patient is a 38 year old man with nonischemic cardiomyopathy requiring a milrinone drip at this time. His multiple comorbidities including morbid obesity, obstructive sleep apnea, EKG, anxiety, chronic abdominal pain.  He presents with complaints of abdominal pain. His pain as aching, he says this pain is chronic. It is typically controlled with Percocet. However, he has run out of Percocet. He requests pain medication. He notes that he is scheduled for instillation of a defibrillator at 5:30 AM today. He requests pain management and discharge at all possible.  Of note, I saw this patient about a month ago at which time I admitted him for anemia. Of note, his bilirubin was elevated at that time and, according to chart review, he has not really had much of an anemia workup. However, his hemoglobin is acceptable at 8.6-stable. His bilirubin remains normal but, it is not significantly changed. The patient denies any history of abdominal surgeries. He denies nausea and vomiting. His pain is similar to multiple previous episodes of abdominal pain.  The patient underwent noncontrast CT of the abdomen and pelvis last month. The study was negative for findings to explain acute abdominal pain. He was noted to have some inguinal adenopathy.  Patient denies fever, vomiting, diarrhea, bloody stools, genitourinary symptoms. He says he is followed by GI.    Past Medical History  Diagnosis Date  . Chronic systolic CHF (congestive heart failure)     On home milrinone - dose titrate to 0.369mcg/kg/min 03/2014.  Marland Kitchen Essential hypertension, benign   . Morbid obesity with BMI of 50.0-59.9, adult   . Dyslipidemia    . CKD (chronic kidney disease), stage III   . Asthma   . Microcytic anemia   . NICM (nonischemic cardiomyopathy)   . History of left heart catheterization     Normal coronaries October 2014  . History of chicken pox   . Perirectal abscess 03/2014  . Allergic rhinitis 04/29/2014  . Anxiety   . Chronic bronchitis   . Sleep apnea     BIPAP  . Prediabetes   . DKA (diabetic ketoacidoses) 02/25/2014   Past Surgical History  Procedure Laterality Date  . Incision and drainage abscess N/A 03/31/2014    Procedure: INCISION AND DRAINAGE  PERI-RECTAL ABSCESS;  Surgeon: Axel Filler, MD;  Location: [facility] OR;  Service: General;  Laterality: N/A;  . Cardiac catheterization  October 2014    [facility]; reportedly clean, no further details available   Family History  Problem Relation Age of Onset  . Hypertension Mother   . Diabetes Maternal Grandmother   . Liver disease Father    History  Substance Use Topics  . Smoking status: Never Smoker   . Smokeless tobacco: Never Used  . Alcohol Use: No    Review of Systems  Ten point review of symptoms performed and is negative with the exception of symptoms noted above. Additionally, the patient has chronic obstructive breath, chronic orthopnea, chronic recurrent edema.  Allergies  Iodine and Shellfish allergy  Home Medications   Prior to Admission medications   Medication Sig Start Date End Date Taking? Authorizing Provider  albuterol (PROVENTIL HFA;VENTOLIN HFA) 108 (90 BASE) MCG/ACT inhaler Inhale 2 puffs into the lungs  daily as needed for wheezing or shortness of breath. 01/08/14  Yes Wilburt FinlayBryan Hager, PA-C  amiodarone (PACERONE) 400 MG tablet Take 0.5 tablets (200 mg total) by mouth 2 (two) times daily. 05/26/14  Yes Laurey Moralealton S McLean, MD  aspirin EC 81 MG tablet Take 81 mg by mouth daily.   Yes Historical Provider, MD  atorvastatin (LIPITOR) 80 MG tablet Take 80 mg by mouth daily. 01/08/14  Yes Wilburt FinlayBryan Hager, PA-C  bumetanide  (BUMEX) 2 MG tablet Take 2.5 tablets (5 mg total) by mouth 2 (two) times daily. 05/26/14  Yes Laurey Moralealton S McLean, MD  carvedilol (COREG) 3.125 MG tablet Take 1 tablet (3.125 mg total) by mouth 2 (two) times daily. 02/20/14  Yes Dolores Pattyaniel R Bensimhon, MD  hydrALAZINE (APRESOLINE) 25 MG tablet Take 1 tablet (25 mg total) by mouth every 8 (eight) hours. 01/23/14  Yes Amy D Clegg, NP  insulin aspart (NOVOLOG FLEXPEN) 100 UNIT/ML FlexPen Inject 1-15 Units into the skin 3 (three) times daily with meals. Sliding scale 04/27/14  Yes Corwin LevinsJames W John, MD  insulin glargine (LANTUS) 100 UNIT/ML injection Inject 0.2 mLs (20 Units total) into the skin at bedtime. 06/13/14  Yes Zannie CovePreetha Joseph, MD  isosorbide mononitrate (IMDUR) 60 MG 24 hr tablet Take 1 tablet (60 mg total) by mouth daily. 05/18/14  Yes Aundria RudAli B Cosgrove, NP  loratadine (CLARITIN) 10 MG tablet Take 10 mg by mouth daily as needed. 04/27/14  Yes Corwin LevinsJames W John, MD  magnesium oxide (MAG-OX) 400 MG tablet Take 1 tablet (400 mg total) by mouth 2 (two) times daily. 03/07/14  Yes Aundria RudAli B Cosgrove, NP  metolazone (ZAROXOLYN) 2.5 MG tablet Take 1 tablet (2.5 mg total) by mouth once a week. 06/13/14  Yes Zannie CovePreetha Joseph, MD  milrinone [facility](PRIMACOR) 20 MG/100ML SOLN infusion Inject 83.65 mcg/min into the vein continuous. 06/19/14  Yes Dolores Pattyaniel R Bensimhon, MD  oxyCODONE-acetaminophen (PERCOCET/ROXICET) 5-325 MG per tablet Take 1 tablet by mouth every 6 (six) hours as needed for moderate pain or severe pain. 06/20/14  Yes Shon Batonourtney F Horton, MD  pantoprazole (PROTONIX) 40 MG tablet Take 1 tablet (40 mg total) by mouth daily. 05/18/14  Yes Aundria RudAli B Cosgrove, NP  potassium chloride SA (K-DUR,KLOR-CON) 10 MEQ tablet Take 8 tablets (80 mEq total) by mouth 2 (two) times daily. 06/19/14  Yes Dolores Pattyaniel R Bensimhon, MD  predniSONE (DELTASONE) 20 MG tablet Take 3 tablets (60 mg total) at bedtime the night before your procedure. Take 3 tablets the morning of your procedure. 07/07/14  Yes Duke SalviaSteven C Klein, MD  ranitidine  (ZANTAC) 150 MG tablet Take 1 tablet (150 mg total) at bedtime the night before your procedure. Take 1 tablet the morning of your procedure. 07/07/14   Duke SalviaSteven C Klein, MD   BP 132/100  Pulse 108  Temp(Src) 98 F (36.7 C) (Oral)  Resp 20  Ht 5' 8.5" (1.74 m)  Wt 341 lb (154.677 kg)  BMI 51.09 kg/m2  SpO2 99% Physical Exam Gen: well developed and well nourished appearing Head: NCAT Eyes: PERL, EOMI Nose: no epistaixis or rhinorrhea Mouth/throat: mucosa is moist and pink Neck: supple, no stridor Lungs: Respiratory rate 28 per minute, CTA B, no wheezing, rhonchi or rales CV: RRR, no murmur, extremities appear well perfused.  Abd: soft, notender, nondistended, morbidly obese Back: no ttp, no cva ttp Skin: warm and dry Ext: PICC line in the right upper extremity is noted without signs of infection, otherwise normal to inspection, brawny edema of both lower legs which is symmetric  Neuro: CN ii-xii grossly intact, no focal deficits Psyche; normal affect,  calm and cooperative.   ED Course  Procedures (including critical care time) Labs Review  Results for orders placed during the hospital encounter of 07/09/14 (from the past 24 hour(s))  CBC WITH DIFFERENTIAL     Status: Abnormal   Collection Time    07/09/14 11:32 PM      Result Value Ref Range   WBC 4.9  4.0 - 10.5 K/uL   RBC 3.75 (*) 4.22 - 5.81 MIL/uL   Hemoglobin 8.6 (*) 13.0 - 17.0 g/dL   HCT 16.1 (*) 09.6 - 04.5 %   MCV 71.5 (*) 78.0 - 100.0 fL   MCH 22.9 (*) 26.0 - 34.0 pg   MCHC 32.1  30.0 - 36.0 g/dL   RDW 40.9 (*) 81.1 - 91.4 %   Platelets 316  150 - 400 K/uL   Neutrophils Relative % 75  43 - 77 %   Neutro Abs 3.6  1.7 - 7.7 K/uL   Lymphocytes Relative 15  12 - 46 %   Lymphs Abs 0.7  0.7 - 4.0 K/uL   Monocytes Relative 7  3 - 12 %   Monocytes Absolute 0.4  0.1 - 1.0 K/uL   Eosinophils Relative 3  0 - 5 %   Eosinophils Absolute 0.1  0.0 - 0.7 K/uL   Basophils Relative 0  0 - 1 %   Basophils Absolute 0.0  0.0 -  0.1 K/uL  COMPREHENSIVE METABOLIC PANEL     Status: Abnormal   Collection Time    07/09/14 11:32 PM      Result Value Ref Range   Sodium 134 (*) 137 - 147 mEq/L   Potassium 3.2 (*) 3.7 - 5.3 mEq/L   Chloride 90 (*) 96 - 112 mEq/L   CO2 26  19 - 32 mEq/L   Glucose, Bld 136 (*) 70 - 99 mg/dL   BUN 21  6 - 23 mg/dL   Creatinine, Ser 7.82 (*) 0.50 - 1.35 mg/dL   Calcium 8.2 (*) 8.4 - 10.5 mg/dL   Total Protein 7.9  6.0 - 8.3 g/dL   Albumin 3.1 (*) 3.5 - 5.2 g/dL   AST 38 (*) 0 - 37 U/L   ALT 21  0 - 53 U/L   Alkaline Phosphatase 131 (*) 39 - 117 U/L   Total Bilirubin 5.6 (*) 0.3 - 1.2 mg/dL   GFR calc non Af Amer 64 (*) >90 mL/min   GFR calc Af Amer 75 (*) >90 mL/min   Anion gap 18 (*) 5 - 15  LIPASE, BLOOD     Status: Abnormal   Collection Time    07/09/14 11:32 PM      Result Value Ref Range   Lipase 127 (*) 11 - 59 U/L  I-STAT TROPOININ, ED     Status: None   Collection Time    07/09/14 11:36 PM      Result Value Ref Range   Troponin i, poc 0.02  0.00 - 0.08 ng/mL   Comment 3            EKG: sinus arrythmia,  no acute ischemic changes, normal intervals, LAD, chronic LBBB, otherwise, normal qrs complex  MDM  Patient here with acute excacerbation of chronic abdominal pain. Says pain controlled at home with Percocet but, only has been getting short supplies and is out. Had recently unremarkable RUQ U/S and CT abd/pelvis. Abdominal exam is benign. WBC is wnl. Lipase mildly  elevated and up a little bit in the past month but, still barely > twice normal. BR remains elevated but not significantly greater than during most recent hospitalization.   Patient is aware that he needs further anemia work up. He is feeling better with acute pain management with Dilaudid 2mg  IV and has tolerated po challenge. He is asking to go home and is stable for discharge.   We have given him Benadryl and prednisone - as had been ordered in advance of cardiac procedure for contrast allergy.   The  patient is stable for d/c and should report for procedure , as scheduled.  It will be the responsibility of his cardiologist to decide if the patient is stable to undergo planned procedure.    Brandt Loosen, MD 07/04/2014 [phone]

## 2014-07-10 ENCOUNTER — Emergency Department (HOSPITAL_COMMUNITY): Payer: Medicare Other

## 2014-07-10 ENCOUNTER — Encounter (HOSPITAL_COMMUNITY): Payer: Medicare Other | Admitting: Anesthesiology

## 2014-07-10 ENCOUNTER — Inpatient Hospital Stay (HOSPITAL_COMMUNITY): Payer: Medicare Other

## 2014-07-10 ENCOUNTER — Ambulatory Visit (HOSPITAL_COMMUNITY): Payer: Medicare Other | Admitting: Anesthesiology

## 2014-07-10 ENCOUNTER — Inpatient Hospital Stay (HOSPITAL_COMMUNITY)
Admission: RE | Admit: 2014-07-10 | Discharge: 2014-07-22 | DRG: 226 | Disposition: E | Payer: Medicare Other | Source: Ambulatory Visit | Attending: Internal Medicine | Admitting: Internal Medicine

## 2014-07-10 ENCOUNTER — Encounter (HOSPITAL_COMMUNITY): Admission: RE | Disposition: E | Payer: Self-pay | Source: Ambulatory Visit | Attending: Internal Medicine

## 2014-07-10 ENCOUNTER — Ambulatory Visit: Payer: Medicare Other | Admitting: Physical Therapy

## 2014-07-10 DIAGNOSIS — I459 Conduction disorder, unspecified: Secondary | ICD-10-CM | POA: Diagnosis not present

## 2014-07-10 DIAGNOSIS — I4949 Other premature depolarization: Secondary | ICD-10-CM | POA: Diagnosis not present

## 2014-07-10 DIAGNOSIS — J9601 Acute respiratory failure with hypoxia: Secondary | ICD-10-CM

## 2014-07-10 DIAGNOSIS — I129 Hypertensive chronic kidney disease with stage 1 through stage 4 chronic kidney disease, or unspecified chronic kidney disease: Secondary | ICD-10-CM | POA: Diagnosis present

## 2014-07-10 DIAGNOSIS — G4730 Sleep apnea, unspecified: Secondary | ICD-10-CM | POA: Diagnosis present

## 2014-07-10 DIAGNOSIS — Z91199 Patient's noncompliance with other medical treatment and regimen due to unspecified reason: Secondary | ICD-10-CM | POA: Diagnosis not present

## 2014-07-10 DIAGNOSIS — I447 Left bundle-branch block, unspecified: Secondary | ICD-10-CM | POA: Diagnosis present

## 2014-07-10 DIAGNOSIS — Z6841 Body Mass Index (BMI) 40.0 and over, adult: Secondary | ICD-10-CM | POA: Diagnosis not present

## 2014-07-10 DIAGNOSIS — J989 Respiratory disorder, unspecified: Secondary | ICD-10-CM

## 2014-07-10 DIAGNOSIS — I469 Cardiac arrest, cause unspecified: Principal | ICD-10-CM

## 2014-07-10 DIAGNOSIS — I468 Cardiac arrest due to other underlying condition: Secondary | ICD-10-CM | POA: Diagnosis present

## 2014-07-10 DIAGNOSIS — I509 Heart failure, unspecified: Secondary | ICD-10-CM | POA: Diagnosis present

## 2014-07-10 DIAGNOSIS — Z794 Long term (current) use of insulin: Secondary | ICD-10-CM | POA: Diagnosis not present

## 2014-07-10 DIAGNOSIS — Z9119 Patient's noncompliance with other medical treatment and regimen: Secondary | ICD-10-CM | POA: Diagnosis not present

## 2014-07-10 DIAGNOSIS — Z66 Do not resuscitate: Secondary | ICD-10-CM | POA: Diagnosis not present

## 2014-07-10 DIAGNOSIS — E785 Hyperlipidemia, unspecified: Secondary | ICD-10-CM | POA: Diagnosis present

## 2014-07-10 DIAGNOSIS — I5022 Chronic systolic (congestive) heart failure: Secondary | ICD-10-CM | POA: Diagnosis present

## 2014-07-10 DIAGNOSIS — J9600 Acute respiratory failure, unspecified whether with hypoxia or hypercapnia: Secondary | ICD-10-CM

## 2014-07-10 DIAGNOSIS — R570 Cardiogenic shock: Secondary | ICD-10-CM

## 2014-07-10 DIAGNOSIS — N183 Chronic kidney disease, stage 3 unspecified: Secondary | ICD-10-CM | POA: Diagnosis present

## 2014-07-10 DIAGNOSIS — E872 Acidosis, unspecified: Secondary | ICD-10-CM | POA: Diagnosis present

## 2014-07-10 DIAGNOSIS — J45909 Unspecified asthma, uncomplicated: Secondary | ICD-10-CM | POA: Diagnosis present

## 2014-07-10 DIAGNOSIS — Z79899 Other long term (current) drug therapy: Secondary | ICD-10-CM | POA: Diagnosis not present

## 2014-07-10 DIAGNOSIS — E119 Type 2 diabetes mellitus without complications: Secondary | ICD-10-CM | POA: Diagnosis present

## 2014-07-10 DIAGNOSIS — J811 Chronic pulmonary edema: Secondary | ICD-10-CM | POA: Diagnosis not present

## 2014-07-10 DIAGNOSIS — F411 Generalized anxiety disorder: Secondary | ICD-10-CM | POA: Diagnosis present

## 2014-07-10 DIAGNOSIS — I428 Other cardiomyopathies: Secondary | ICD-10-CM | POA: Diagnosis present

## 2014-07-10 DIAGNOSIS — N179 Acute kidney failure, unspecified: Secondary | ICD-10-CM

## 2014-07-10 DIAGNOSIS — Z7982 Long term (current) use of aspirin: Secondary | ICD-10-CM | POA: Diagnosis not present

## 2014-07-10 DIAGNOSIS — N189 Chronic kidney disease, unspecified: Secondary | ICD-10-CM

## 2014-07-10 HISTORY — PX: BI-VENTRICULAR IMPLANTABLE CARDIOVERTER DEFIBRILLATOR: SHX5459

## 2014-07-10 LAB — COMPREHENSIVE METABOLIC PANEL
ALT: 21 U/L (ref 0–53)
AST: 38 U/L — ABNORMAL HIGH (ref 0–37)
Albumin: 3.1 g/dL — ABNORMAL LOW (ref 3.5–5.2)
Alkaline Phosphatase: 131 U/L — ABNORMAL HIGH (ref 39–117)
Anion gap: 18 — ABNORMAL HIGH (ref 5–15)
BUN: 21 mg/dL (ref 6–23)
CO2: 26 meq/L (ref 19–32)
Calcium: 8.2 mg/dL — ABNORMAL LOW (ref 8.4–10.5)
Chloride: 90 mEq/L — ABNORMAL LOW (ref 96–112)
Creatinine, Ser: 1.39 mg/dL — ABNORMAL HIGH (ref 0.50–1.35)
GFR calc Af Amer: 75 mL/min — ABNORMAL LOW (ref >90)
GFR, EST NON AFRICAN AMERICAN: 64 mL/min — AB (ref >90)
Glucose, Bld: 136 mg/dL — ABNORMAL HIGH (ref 70–99)
Potassium: 3.2 mEq/L — ABNORMAL LOW (ref 3.7–5.3)
Sodium: 134 mEq/L — ABNORMAL LOW (ref 137–147)
Total Bilirubin: 5.6 mg/dL — ABNORMAL HIGH (ref 0.3–1.2)
Total Protein: 7.9 g/dL (ref 6.0–8.3)

## 2014-07-10 LAB — PROTIME-INR
INR: 1.43 (ref 0.00–1.49)
Prothrombin Time: 17.5 seconds — ABNORMAL HIGH (ref 11.6–15.2)

## 2014-07-10 LAB — POCT I-STAT 7, (LYTES, BLD GAS, ICA,H+H)
ACID-BASE DEFICIT: 6 mmol/L — AB (ref 0.0–2.0)
ACID-BASE DEFICIT: 7 mmol/L — AB (ref 0.0–2.0)
Acid-base deficit: 8 mmol/L — ABNORMAL HIGH (ref 0.0–2.0)
BICARBONATE: 21.3 meq/L (ref 20.0–24.0)
Bicarbonate: 22 mEq/L (ref 20.0–24.0)
Bicarbonate: 23.4 mEq/L (ref 20.0–24.0)
CALCIUM ION: 0.97 mmol/L — AB (ref 1.12–1.23)
CALCIUM ION: 0.94 mmol/L — AB (ref 1.12–1.23)
Calcium, Ion: 0.97 mmol/L — ABNORMAL LOW (ref 1.12–1.23)
HCT: 37 % — ABNORMAL LOW (ref 39.0–52.0)
HEMATOCRIT: 38 % — AB (ref 39.0–52.0)
HEMATOCRIT: 37 % — AB (ref 39.0–52.0)
HEMOGLOBIN: 12.6 g/dL — AB (ref 13.0–17.0)
Hemoglobin: 12.6 g/dL — ABNORMAL LOW (ref 13.0–17.0)
Hemoglobin: 12.9 g/dL — ABNORMAL LOW (ref 13.0–17.0)
O2 Saturation: 80 %
O2 Saturation: 81 %
O2 Saturation: 86 %
PH ART: 7.15 — AB (ref 7.350–7.450)
PO2 ART: 47 mmHg — AB (ref 80.0–100.0)
PO2 ART: 67 mmHg — AB (ref 80.0–100.0)
POTASSIUM: 3.6 meq/L — AB (ref 3.7–5.3)
Patient temperature: 34.3
Potassium: 3.5 mEq/L — ABNORMAL LOW (ref 3.7–5.3)
Potassium: 3.2 mEq/L — ABNORMAL LOW (ref 3.7–5.3)
Sodium: 132 mEq/L — ABNORMAL LOW (ref 137–147)
Sodium: 132 mEq/L — ABNORMAL LOW (ref 137–147)
Sodium: 136 mEq/L — ABNORMAL LOW (ref 137–147)
TCO2: 24 mmol/L (ref 0–100)
TCO2: 25 mmol/L (ref 0–100)
TCO2: 23 mmol/L (ref 0–100)
pCO2 arterial: 55.3 mmHg — ABNORMAL HIGH (ref 35.0–45.0)
pCO2 arterial: 60.7 mmHg (ref 35.0–45.0)
pCO2 arterial: 61.1 mmHg (ref 35.0–45.0)
pH, Arterial: 7.167 — CL (ref 7.350–7.450)
pH, Arterial: 7.22 — ABNORMAL LOW (ref 7.350–7.450)
pO2, Arterial: 58 mmHg — ABNORMAL LOW (ref 80.0–100.0)

## 2014-07-10 LAB — POCT I-STAT 3, ART BLOOD GAS (G3+)
Acid-base deficit: 14 mmol/L — ABNORMAL HIGH (ref 0.0–2.0)
Acid-base deficit: 7 mmol/L — ABNORMAL HIGH (ref 0.0–2.0)
BICARBONATE: 16.8 meq/L — AB (ref 20.0–24.0)
Bicarbonate: 23.2 mEq/L (ref 20.0–24.0)
O2 Saturation: 78 %
O2 Saturation: 61 %
PCO2 ART: 68.4 mmHg — AB (ref 35.0–45.0)
PH ART: 7.12 — AB (ref 7.350–7.450)
PH ART: 7.138 — AB (ref 7.350–7.450)
PO2 ART: 42 mmHg — AB (ref 80.0–100.0)
Patient temperature: 34
Patient temperature: 98.7
TCO2: 18 mmol/L (ref 0–100)
TCO2: 25 mmol/L (ref 0–100)
pCO2 arterial: 49.4 mmHg — ABNORMAL HIGH (ref 35.0–45.0)
pO2, Arterial: 48 mmHg — ABNORMAL LOW (ref 80.0–100.0)

## 2014-07-10 LAB — GLUCOSE, CAPILLARY: Glucose-Capillary: 155 mg/dL — ABNORMAL HIGH (ref 70–99)

## 2014-07-10 LAB — SURGICAL PCR SCREEN
MRSA, PCR: NEGATIVE
STAPHYLOCOCCUS AUREUS: NEGATIVE

## 2014-07-10 LAB — LIPASE, BLOOD: Lipase: 127 U/L — ABNORMAL HIGH (ref 11–59)

## 2014-07-10 LAB — POTASSIUM: POTASSIUM: 3.5 meq/L — AB (ref 3.7–5.3)

## 2014-07-10 SURGERY — BI-VENTRICULAR IMPLANTABLE CARDIOVERTER DEFIBRILLATOR  (CRT-D)
Anesthesia: Monitor Anesthesia Care

## 2014-07-10 MED ORDER — MILRINONE IN DEXTROSE 20 MG/100ML IV SOLN: 0.5000 ug/kg/min | INTRAVENOUS | Status: DC

## 2014-07-10 MED ORDER — SUCCINYLCHOLINE CHLORIDE 20 MG/ML IJ SOLN
INTRAMUSCULAR | Status: DC | PRN
  Administered 2014-07-10 (×2): 100 mg via INTRAVENOUS

## 2014-07-10 MED ORDER — OXYCODONE-ACETAMINOPHEN 5-325 MG PO TABS: 1.0000 | ORAL_TABLET | ORAL | Status: DC | PRN

## 2014-07-10 MED ORDER — PROPOFOL 10 MG/ML IV BOLUS: INTRAVENOUS | Status: DC | PRN

## 2014-07-10 MED ORDER — DIPHENHYDRAMINE HCL 50 MG/ML IJ SOLN
INTRAMUSCULAR | Status: AC
  Filled 2014-07-10: qty 1

## 2014-07-10 MED ORDER — FENTANYL CITRATE 0.05 MG/ML IJ SOLN
INTRAMUSCULAR | Status: DC | PRN
  Administered 2014-07-10: 50 ug via INTRAVENOUS

## 2014-07-10 MED ORDER — HEPARIN SOD (PORK) LOCK FLUSH 100 UNIT/ML IV SOLN: 250.0000 [IU] | INTRAVENOUS | Status: DC | PRN

## 2014-07-10 MED ORDER — OXYCODONE-ACETAMINOPHEN 5-325 MG PO TABS: 1.0000 | ORAL_TABLET | ORAL | Status: AC | PRN

## 2014-07-10 MED ORDER — ROCURONIUM BROMIDE 100 MG/10ML IV SOLN
INTRAVENOUS | Status: DC | PRN
  Administered 2014-07-10 (×2): 50 mg via INTRAVENOUS

## 2014-07-10 MED ORDER — INSULIN ASPART 100 UNIT/ML ~~LOC~~ SOLN: 0.0000 [IU] | Freq: Three times a day (TID) | SUBCUTANEOUS | Status: DC

## 2014-07-10 MED ORDER — FENTANYL CITRATE 0.05 MG/ML IJ SOLN: 100.0000 ug | INTRAMUSCULAR | Status: DC | PRN

## 2014-07-10 MED ORDER — ALBUTEROL SULFATE (2.5 MG/3ML) 0.083% IN NEBU: 2.5000 mg | INHALATION_SOLUTION | Freq: Every day | RESPIRATORY_TRACT | Status: DC | PRN

## 2014-07-10 MED ORDER — ASPIRIN EC 81 MG PO TBEC
81.0000 mg | DELAYED_RELEASE_TABLET | Freq: Every day | ORAL | Status: DC
  Filled 2014-07-10: qty 1

## 2014-07-10 MED ORDER — ONDANSETRON HCL 4 MG/2ML IJ SOLN: 4.0000 mg | Freq: Four times a day (QID) | INTRAMUSCULAR | Status: DC | PRN

## 2014-07-10 MED ORDER — PROMETHAZINE HCL 25 MG/ML IJ SOLN
6.2500 mg | INTRAMUSCULAR | Status: DC | PRN
Start: 2014-07-10 — End: 2014-07-10

## 2014-07-10 MED ORDER — DOPAMINE-DEXTROSE 3.2-5 MG/ML-% IV SOLN
INTRAVENOUS | Status: DC | PRN
  Administered 2014-07-10: 3 ug/kg/min via INTRAVENOUS

## 2014-07-10 MED ORDER — SODIUM BICARBONATE 8.4 % IV SOLN
INTRAVENOUS | Status: DC
  Filled 2014-07-10 (×3): qty 150

## 2014-07-10 MED ORDER — PHENYLEPHRINE HCL 10 MG/ML IJ SOLN
INTRAMUSCULAR | Status: DC | PRN
  Administered 2014-07-10 (×2): 80 ug via INTRAVENOUS

## 2014-07-10 MED ORDER — SODIUM BICARBONATE 8.4 % IV SOLN
INTRAVENOUS | Status: DC | PRN
  Administered 2014-07-10 (×3): 50 meq via INTRAVENOUS

## 2014-07-10 MED ORDER — METHYLPREDNISOLONE SODIUM SUCC 125 MG IJ SOLR
125.0000 mg | Freq: Once | INTRAMUSCULAR | Status: DC
Start: 2014-07-10 — End: 2014-07-10

## 2014-07-10 MED ORDER — DIPHENHYDRAMINE HCL 50 MG/ML IJ SOLN
INTRAMUSCULAR | Status: DC | PRN
  Administered 2014-07-10: 50 mg via INTRAVENOUS

## 2014-07-10 MED ORDER — AMIODARONE HCL 200 MG PO TABS
200.0000 mg | ORAL_TABLET | Freq: Two times a day (BID) | ORAL | Status: DC
  Filled 2014-07-10: qty 1

## 2014-07-10 MED ORDER — OXYCODONE HCL 5 MG/5ML PO SOLN: 5.0000 mg | Freq: Once | ORAL | Status: DC | PRN

## 2014-07-10 MED ORDER — MAGNESIUM OXIDE 400 (241.3 MG) MG PO TABS
400.0000 mg | ORAL_TABLET | Freq: Two times a day (BID) | ORAL | Status: DC
  Filled 2014-07-10: qty 1

## 2014-07-10 MED ORDER — FUROSEMIDE 10 MG/ML IJ SOLN
INTRAMUSCULAR | Status: AC
  Filled 2014-07-10: qty 4

## 2014-07-10 MED ORDER — FUROSEMIDE 10 MG/ML IJ SOLN
15.0000 mg/h | INTRAVENOUS | Status: DC
  Filled 2014-07-10 (×2): qty 25

## 2014-07-10 MED ORDER — DEXTROSE 5 % IV SOLN: INTRAVENOUS | Status: DC

## 2014-07-10 MED ORDER — METOLAZONE 2.5 MG PO TABS: 2.5000 mg | ORAL_TABLET | ORAL | Status: DC

## 2014-07-10 MED ORDER — OXYCODONE-ACETAMINOPHEN 5-325 MG PO TABS: 1.0000 | ORAL_TABLET | Freq: Four times a day (QID) | ORAL | Status: DC | PRN

## 2014-07-10 MED ORDER — PHENYLEPHRINE HCL 10 MG/ML IJ SOLN
10.0000 mg | INTRAVENOUS | Status: DC | PRN
  Administered 2014-07-10: 25 ug/min via INTRAVENOUS

## 2014-07-10 MED ORDER — OXYCODONE HCL 5 MG PO TABS: 5.0000 mg | ORAL_TABLET | Freq: Once | ORAL | Status: DC | PRN

## 2014-07-10 MED ORDER — INSULIN GLARGINE 100 UNIT/ML ~~LOC~~ SOLN
20.0000 [IU] | Freq: Every day | SUBCUTANEOUS | Status: DC
  Filled 2014-07-10: qty 0.2

## 2014-07-10 MED ORDER — HEPARIN (PORCINE) IN NACL 2-0.9 UNIT/ML-% IJ SOLN
INTRAMUSCULAR | Status: AC
  Filled 2014-07-10: qty 500

## 2014-07-10 MED ORDER — ATORVASTATIN CALCIUM 80 MG PO TABS
80.0000 mg | ORAL_TABLET | Freq: Every day | ORAL | Status: DC
  Filled 2014-07-10: qty 1

## 2014-07-10 MED ORDER — CARVEDILOL 3.125 MG PO TABS
3.1250 mg | ORAL_TABLET | Freq: Two times a day (BID) | ORAL | Status: DC
  Filled 2014-07-10 (×2): qty 1

## 2014-07-10 MED ORDER — ALBUTEROL SULFATE HFA 108 (90 BASE) MCG/ACT IN AERS: 2.0000 | INHALATION_SPRAY | Freq: Every day | RESPIRATORY_TRACT | Status: DC | PRN

## 2014-07-10 MED ORDER — MAGNESIUM OXIDE 400 MG PO TABS: 400.0000 mg | ORAL_TABLET | Freq: Two times a day (BID) | ORAL | Status: DC

## 2014-07-10 MED ORDER — FAMOTIDINE IN NACL 20-0.9 MG/50ML-% IV SOLN
INTRAVENOUS | Status: AC
  Filled 2014-07-10: qty 50

## 2014-07-10 MED ORDER — MUPIROCIN 2 % EX OINT
TOPICAL_OINTMENT | CUTANEOUS | Status: AC
  Filled 2014-07-10: qty 22

## 2014-07-10 MED ORDER — FAMOTIDINE IN NACL 20-0.9 MG/50ML-% IV SOLN: 20.0000 mg | Freq: Once | INTRAVENOUS | Status: DC

## 2014-07-10 MED ORDER — POTASSIUM CHLORIDE CRYS ER 20 MEQ PO TBCR: 80.0000 meq | EXTENDED_RELEASE_TABLET | Freq: Two times a day (BID) | ORAL | Status: DC

## 2014-07-10 MED ORDER — METHYLPREDNISOLONE SODIUM SUCC 125 MG IJ SOLR
INTRAMUSCULAR | Status: AC
  Filled 2014-07-10: qty 2

## 2014-07-10 MED ORDER — DIPHENHYDRAMINE HCL 50 MG/ML IJ SOLN: 25.0000 mg | Freq: Once | INTRAMUSCULAR | Status: DC

## 2014-07-10 MED ORDER — CEFAZOLIN SODIUM 1-5 GM-% IV SOLN
1.0000 g | Freq: Four times a day (QID) | INTRAVENOUS | Status: DC
  Filled 2014-07-10 (×3): qty 50

## 2014-07-10 MED ORDER — ISOSORBIDE MONONITRATE ER 60 MG PO TB24
60.0000 mg | ORAL_TABLET | Freq: Every day | ORAL | Status: DC
  Filled 2014-07-10: qty 1

## 2014-07-10 MED ORDER — LIDOCAINE HCL (PF) 1 % IJ SOLN
INTRAMUSCULAR | Status: AC
  Filled 2014-07-10: qty 60

## 2014-07-10 MED ORDER — SODIUM CHLORIDE 0.9 % IV SOLN
INTRAVENOUS | Status: DC | PRN
  Administered 2014-07-10 (×2): via INTRAVENOUS

## 2014-07-10 MED ORDER — ACETAMINOPHEN 325 MG PO TABS
325.0000 mg | ORAL_TABLET | ORAL | Status: DC | PRN
Start: 2014-07-10 — End: 2014-07-10

## 2014-07-10 MED ORDER — PANTOPRAZOLE SODIUM 40 MG PO TBEC: 40.0000 mg | DELAYED_RELEASE_TABLET | Freq: Every day | ORAL | Status: DC

## 2014-07-10 MED ORDER — EPINEPHRINE HCL 1 MG/ML IJ SOLN
INTRAMUSCULAR | Status: DC | PRN
  Administered 2014-07-10 (×5): 1 mg via INTRAVENOUS

## 2014-07-10 MED ORDER — INSULIN ASPART 100 UNIT/ML ~~LOC~~ SOLN: 2.0000 [IU] | SUBCUTANEOUS | Status: DC

## 2014-07-10 MED ORDER — INSULIN ASPART 100 UNIT/ML FLEXPEN: 1.0000 [IU] | PEN_INJECTOR | Freq: Three times a day (TID) | SUBCUTANEOUS | Status: DC

## 2014-07-10 MED ORDER — BUMETANIDE 2 MG PO TABS
5.0000 mg | ORAL_TABLET | Freq: Two times a day (BID) | ORAL | Status: DC
  Filled 2014-07-10 (×2): qty 1

## 2014-07-10 MED ORDER — MILRINONE IN DEXTROSE 20 MG/100ML IV SOLN
INTRAVENOUS | Status: DC | PRN
  Administered 2014-07-10: 83 ug/kg/min via INTRAVENOUS

## 2014-07-10 MED ORDER — FUROSEMIDE 8 MG/ML PO SOLN
ORAL | Status: DC | PRN
  Administered 2014-07-10: 80 mg via ORAL

## 2014-07-10 MED ORDER — METHYLPREDNISOLONE SODIUM SUCC 125 MG IJ SOLR
INTRAMUSCULAR | Status: DC | PRN
  Administered 2014-07-10: 125 mg via INTRAVENOUS

## 2014-07-10 MED ORDER — DEXTROSE 5 % IV SOLN
0.5000 ug/min | INTRAVENOUS | Status: DC
  Filled 2014-07-10: qty 1

## 2014-07-10 MED ORDER — HYDROMORPHONE HCL PF 1 MG/ML IJ SOLN: 0.2500 mg | INTRAMUSCULAR | Status: DC | PRN

## 2014-07-10 MED ORDER — ETOMIDATE 2 MG/ML IV SOLN
INTRAVENOUS | Status: DC | PRN
  Administered 2014-07-10: 18 mg via INTRAVENOUS

## 2014-07-10 MED ORDER — SODIUM BICARBONATE 8.4 % IV SOLN
INTRAVENOUS | Status: AC
  Filled 2014-07-10: qty 50

## 2014-07-10 MED ORDER — DOPAMINE-DEXTROSE 3.2-5 MG/ML-% IV SOLN
2.0000 ug/kg/min | INTRAVENOUS | Status: DC
Start: 2014-07-10 — End: 2014-07-10

## 2014-07-10 MED ORDER — HYDRALAZINE HCL 25 MG PO TABS
25.0000 mg | ORAL_TABLET | Freq: Three times a day (TID) | ORAL | Status: DC
  Filled 2014-07-10 (×3): qty 1

## 2014-07-10 MED ORDER — DIPHENHYDRAMINE HCL 50 MG/ML IJ SOLN
25.0000 mg | Freq: Once | INTRAMUSCULAR | Status: AC
  Administered 2014-07-10: 25 mg via INTRAVENOUS
  Filled 2014-07-10: qty 1

## 2014-07-11 MED ORDER — ARTIFICIAL TEARS OP OINT
TOPICAL_OINTMENT | OPHTHALMIC | Status: DC | PRN
Start: 2014-07-10 — End: 2014-07-11
  Administered 2014-07-10: 1 via OPHTHALMIC

## 2014-07-11 MED FILL — Medication: Qty: 1 | Status: AC

## 2014-07-11 NOTE — Op Note (Signed)
NAMMardella Wyatt:  Wyatt, Steven                  ACCOUNT NO.:  [id number]  MEDICAL RECORD NO.:  [id number]  LOCATION:                                 FACILITY:  PHYSICIAN:  Duke SalviaSteven C. Klein, MD, FACCDATE OF BIRTH:  13-Oct-1976  DATE OF PROCEDURE:  10-26-14 DATE OF DISCHARGE:  10-26-14                              OPERATIVE REPORT   PREOPERATIVE DIAGNOSES:  Nonischemic cardiomyopathy with ambulatory class IV heart failure on the milrinone drip, morbid obesity, and left bundle-branch block.  POSTOPERATIVE DIAGNOSES:  Nonischemic cardiomyopathy with ambulatory class IV heart failure on the milrinone drip, morbid obesity, and left bundle-branch block; pulseless electrical activity arrest, acute pulmonary edema and cardiogenic shock.  PROCEDURE:  Implantation of a CRT system with a banding of the procedure following insertion of the RV lead and efforts to deploy the LV lead.  TECHNIQUE:  Following obtaining informed consent, the patient was sedated under general anesthesia with the care of Dr. Kelly SplinterSanger.  Initial hemodynamic assessments were inconclusive __________ arterial line was placed and the patient was __________ and the patient is a post LMA. Please see the anesthesia notes.  Following the routine prep and drape, lidocaine was infiltrated along the line of the subclavicular region and incision was made and carried down to layer of the prepectoral fascia.  Electrocautery with sharp dissection, a pocket was formed similarly.  Hemostasis was obtained.  Thereafter, attention was turned to gain access to the extrathoracic left subclavian vein which was accomplished without difficulty.  Three separate venipunctures were accomplished.  The CVP was measured and was significantly greater than 25.  We then deployed the RV defibrillator lead, [employer] [id number] leads, [id number] to the right ventricular apex.  The bipolar R-wave was 9.5 with a pace impedance of 564, a threshold of 0.8 at 0.5.  Current  threshold was 0.8 mA.  There is no diaphragmatic pacing at 10 V __________.  The lead was secured to the prepectoral fascia.  We then attempted to gain access to the coronary sinus which was rather difficult because of his serpentine entrance and valves that were __________ throughout.  We were finally able to do this and while this was going on, the anesthetic records will demonstrates that the patient was being attended to carefully because of acidosis and modest hypoxemia.  While undertaking efforts to deploy the coronary sinus lead, the patient became increasingly bradycardic and then asystolic.  Prior to the asystole, fluoroscopy of the heart demonstrated no cardiac movement and PA rest was declared.  The patient received epinephrine as part of the resuscitation efforts by Anesthesia.  CPR was immediately commenced and after about 5 minutes, the patient had return of circulation with a heart rate in the 120s which was post was normal and a blood pressure in the 130-140 range.  Echocardiogram demonstrated no evidence of a pericardial effusion.  Congestive heart failure team was consulted.  It was elected to not move with mechanical therapies as he had not been felt to be a candidate for long-term mechanical therapies. It was elected at this point to abandon the procedure.  The RV lead was capped and the coronary sinus deployment system was removed  in toto and the third venous wire was also removed.  Pressure was held for 5 minutes as there was bleeding.  The pocket was copiously irrigated with antibiotic-containing saline solution.  Hemostasis was assured.  The wound was then closed in 2 layers in normal fashion.  __________ dressing was applied.  The patient is currently stable with a blood pressure of 105 and a heart rate of 124.  Milrinone is at 0.5 mcg per kilo per minute.  The Lasix drip will be initiated 15 mg/hour and dopamine will be added at 2 mcg per kilo per  minute.     Duke Salvia, MD, [facility]     SCK/MEDQ  D:  06/25/2014  T:  07-30-2014  Job:  [id number]

## 2014-07-11 NOTE — Discharge Summary (Signed)
The pt was admitted for CRT given class IVa ambulatory CHF Upon sedation, blood pressure measurement s were lost and O2 sats difficult to measure.  The procedures was delayed until anesthesia was satisfied.  Blood gas subsequently demonstrated metabolic acidosis and CVP measureemnt> 25 ( and considerably at that)  BP and HR were then stable and we proceeded.  I was unaware of the metabolic acidosis unitl after his PEA arrest that occurred about one hour later THis responded to CPR and Epinephrine We aborted the procedure at that point And he was transferrred to tthe ICU.  Just prior to this, about 1 hr after the initial arrest, he arrested again, and then multiple times over the next few hours. Blood work showed inability to oxygenate and ongoing metabolic acidosis   His prognosis had for months been felt to be grim.  CCM and CHF teams were managing the patient foloowing his arrival in the ICU  I and they spoke with the patients wife, and ultimately it was decided that in the context of his severe cardiomyopathy and refractory heart failure, that further efforts to prolong his life would be futile and these efforts were terminated

## 2014-07-11 NOTE — Addendum Note (Signed)
Addendum created 07/12/2014 1429 by Fransisca Kaufmann, CRNA   Modules edited: Anesthesia Blocks and Procedures, Anesthesia Events, Anesthesia Flowsheet, Anesthesia Medication Administration, Clinical Notes   Clinical Notes:  File: [id number]

## 2014-07-17 ENCOUNTER — Telehealth: Payer: Self-pay | Admitting: Internal Medicine

## 2014-07-17 NOTE — Telephone Encounter (Signed)
Original Death Certificate Rec From [facility] Will hold Steven Wyatt/Dr.Klein back in Office Tuesday ( 7.28)

## 2014-07-18 ENCOUNTER — Telehealth: Payer: Self-pay | Admitting: Internal Medicine

## 2014-07-18 ENCOUNTER — Encounter: Payer: Self-pay | Admitting: *Deleted

## 2014-07-18 NOTE — Telephone Encounter (Signed)
Original Death Certificate Signed By Dr.Klein, Spoke With Crystal @ [facility] she is aware Ready  For pick Up 7.28.15/km

## 2014-07-18 NOTE — Telephone Encounter (Signed)
Dr.Klein Wrote letter on  On [facility] Letterhead faxed to Dennis Bast W/ [facility] Department @ [phone]  7.28.15/km  ( See Letter Scanned In )

## 2014-07-18 NOTE — Telephone Encounter (Signed)
Spoke With [employer] [facility]) # 25 On d/c cannot be checked "Pending", he Will be Bringing another d/c For Dr.Klein To Correct It Needs to Be Checked "Cannot Be Determined"  Since He Put a Cause of Death on The Certificate I called him back and asked if he could Just bring a New d/c for Dr.Klein To complete so their would not be Two x marks in box 25 and that copy would Be sent back by the [facility] for it being done incorrectly he stated he would just bring a New D/C and Wait in lobby For it to be Completed, In The Meantime I received a Call From [facility] Department asking me If I told [facility] to Bring a New d/c By For Dr.Klien To Complete I told Her Yes and she stated I should have Not asked for a New d/c, that what Needs to be Done is Dr.Klein Needs to Write a Letter On Letter head stating it is Ok to have #25 Manner Of Death changed to "Natural" and have this faxed to her. I have talked with Sherri about this and she will discuss with Dr.Klein Once this Information is Obtained I will Call Alona Bene back @ [phone]. Alona Bene is also aware Dr.Klein is in Clinic Today with patients.

## 2014-07-18 NOTE — Telephone Encounter (Signed)
D/C Picked Up 7.28.15/km

## 2014-07-20 ENCOUNTER — Ambulatory Visit: Payer: Medicare Other

## 2014-07-22 NOTE — Progress Notes (Signed)
Remaining Milrinone wasted in sink. Milrinone pump from [facility] given to wife for return to agency.

## 2014-07-22 NOTE — H&P (View-Only) (Signed)
Patient Care Team: Corwin LevinsJames W John, MD as PCP - General (Internal Medicine)   HPI  Steven RomansWayne O Geng Montez HagemanJr. is a 38 y.o. male Seen in followup for consideration of CRT  He has NICM and LBBB and on home milrinone  He had freq PVCs  Holter>>8%;   amiodarone was initiated.  Currently not felt to be a candidate for therapy because of noncompliance and obesity  He was prescribed a LifeVest. Compliance was a question.  An  echo 3/15 was 35% but felt to be poorly visualizing; he underwent repeat echo with contrast  6/15 which demonstrated an ejection fraction of 15% with significant chamber enlargement.  He was recently hospitalized for heart failure and underwent a diuresis of 25 pounds. -15 L.  Her most recent ECG demonstrates a nonspecific IVCD with a QRS duration 134 ms    Past Medical History  Diagnosis Date  . Chronic systolic CHF (congestive heart failure)     On home milrinone - dose titrate to 0.36275mcg/kg/min 03/2014.  Marland Kitchen. Essential hypertension, benign   . Morbid obesity with BMI of 50.0-59.9, adult   . Dyslipidemia   . CKD (chronic kidney disease), stage III   . Asthma   . Microcytic anemia   . NICM (nonischemic cardiomyopathy)   . History of left heart catheterization     Normal coronaries October 2014  . History of chicken pox   . Perirectal abscess 03/2014  . Allergic rhinitis 04/29/2014  . Anxiety   . Chronic bronchitis   . Sleep apnea     BIPAP  . Prediabetes   . DKA (diabetic ketoacidoses) 02/25/2014    Past Surgical History  Procedure Laterality Date  . Incision and drainage abscess N/A 03/31/2014    Procedure: INCISION AND DRAINAGE  PERI-RECTAL ABSCESS;  Surgeon: Axel FillerArmando Ramirez, MD;  Location: [facility] OR;  Service: General;  Laterality: N/A;  . Cardiac [facility]  October 2014    [facility]; reportedly [facility], no further details available    Current Outpatient Prescriptions  Medication Sig Dispense Refill  . albuterol (PROVENTIL  HFA;VENTOLIN HFA) 108 (90 BASE) MCG/ACT inhaler Inhale 2 puffs into the lungs daily as needed for wheezing or shortness of breath.      Marland Kitchen. amiodarone (PACERONE) 400 MG tablet Take 0.5 tablets (200 mg total) by mouth 2 (two) times daily.  60 tablet  2  . aspirin EC 81 MG tablet Take 81 mg by mouth daily.      Marland Kitchen. atorvastatin (LIPITOR) 80 MG tablet Take 80 mg by mouth daily.      . bumetanide (BUMEX) 2 MG tablet Take 2.5 tablets (5 mg total) by mouth 2 (two) times daily.  120 tablet  6  . carvedilol (COREG) 3.125 MG tablet Take 1 tablet (3.125 mg total) by mouth 2 (two) times daily.  60 tablet  3  . hydrALAZINE (APRESOLINE) 25 MG tablet Take 1 tablet (25 mg total) by mouth every 8 (eight) hours.  90 tablet  6  . insulin aspart (NOVOLOG FLEXPEN) 100 UNIT/ML FlexPen Inject 1-15 Units into the skin 3 (three) times daily with meals. Sliding scale  15 mL  11  . insulin glargine (LANTUS) 100 UNIT/ML injection Inject 0.2 mLs (20 Units total) into the skin at bedtime.      . isosorbide mononitrate (IMDUR) 60 MG 24 hr tablet Take 1 tablet (60 mg total) by mouth daily.  30 tablet  3  . loratadine (CLARITIN) 10 MG tablet Take 10  mg by mouth daily as needed.      . magnesium oxide (MAG-OX) 400 MG tablet Take 1 tablet (400 mg total) by mouth 2 (two) times daily.  60 tablet  3  . metolazone (ZAROXOLYN) 2.5 MG tablet Take 1 tablet (2.5 mg total) by mouth once a week.  6 tablet  0  . milrinone (PRIMACOR) 20 MG/100ML SOLN infusion Inject 62.7375 mcg/min into the vein continuous.  100 mL  6  . oxyCODONE-acetaminophen (PERCOCET/ROXICET) 5-325 MG per tablet Take 1 tablet by mouth every 6 (six) hours as needed for severe pain.  15 tablet  0  . pantoprazole (PROTONIX) 40 MG tablet Take 1 tablet (40 mg total) by mouth daily.  30 tablet  3  . potassium chloride SA (K-DUR,KLOR-CON) 20 MEQ tablet Take 3 tablets (60 mEq total) by mouth 2 (two) times daily.  120 tablet  3  . promethazine (PHENERGAN) 25 MG tablet Take 1 tablet (25  mg total) by mouth every 6 (six) hours as needed for nausea or vomiting.  10 tablet  0   No current facility-administered medications for this visit.    Allergies  Allergen Reactions  . Iodine Anaphylaxis  . Shellfish Allergy Anaphylaxis    Review of Systems negative except from HPI and PMH  Physical Exam BP 113/77  Pulse 102  Ht 5' 8.5" (1.74 m)  Wt 334 lb (151.501 kg)  BMI 50.04 kg/m2 Well developed and well nourished in no acute distress HENT normal E scleral and icterus clear Neck Supple JVP flat; carotids brisk and full Clear to ausculation  Regular rate and rhythm, no murmurs gallops or rub Soft with active bowel sounds No clubbing cyanosis trace brawny  Edema Alert and oriented, grossly normal motor and sensory function Skin Warm and Dry  ECG demonstrates sinus rhythm at 102 Intervals 20/13/or his 7 Axis rightward at 156 There are no monophasic R waves    Assessment and  Plan  Left bundle branch block probably rate related  Nonischemic cardiomyopathy EF 15% echo 6/15  Congestive heart failure- dependent milrinone  Nonspecific IVCD  PVCs with a meal he or so of the  Noncompliance-improving  LifeVest  The patient has a nonspecific IVCD rates below 107. (See ECG 04/11/14)  QRS duration is also the 135 range. This makes CRT a less optimal option especially given things like right ventricular failure.  congestive heart failure status he is currently euvolemic  Given his milrinone dependence I do not think he would be a candidate for   ivabradine or uptitrartion of his betablocker  In the absence of destination therapy I am not sure there is a role for ICD   Will discuss with Dr DM   We have also had a discussion about the importance of LifeVest and compliance if it is his desire that it be available in the event of a cardiac arrest. I stressed that there are no warning signs.

## 2014-07-22 NOTE — Progress Notes (Signed)
Notified Al Decant, M.E. of expiration.  Per M.E. patient is not a candidate for autopsy.

## 2014-07-22 NOTE — Progress Notes (Signed)
Code Blue called to 2 Heart/Vascular. Arrived at Steven Wyatt room to find Dr. Molli Knock, CCM, running the code. No assistance required.  Rodrigo Ran, MD PGY-1 Family Medicine Residency

## 2014-07-22 NOTE — Progress Notes (Signed)
Pt with recurrent arrest   Treated with Epi and CPr with resumption of pulse   This has recurred now x 2  With CCM support  I have alerted the wife as to the situation and the grim prognosis related to his cardiogenic shock and pulmonary edema

## 2014-07-22 NOTE — Anesthesia Procedure Notes (Addendum)
Procedure Name: Intubation Date/Time: 07/02/2014 9:00 AM Performed by: Fransisca Kaufmann Pre-anesthesia Checklist: Patient identified, Emergency Drugs available, Suction available and Patient being monitored Patient Re-evaluated:Patient Re-evaluated prior to inductionOxygen Delivery Method: Circle system utilized Preoxygenation: Pre-oxygenation with 100% oxygen Intubation Type: IV induction Ventilation: Mask ventilation without difficulty Laryngoscope Size: Mac and 4 Grade View: Grade I Tube type: Oral Tube size: 8.0 mm Number of attempts: 1 Airway Equipment and Method: Stylet Placement Confirmation: ETT inserted through vocal cords under direct vision,  positive ETCO2 and breath sounds checked- equal and bilateral Tube secured with: Tape Dental Injury: Teeth and Oropharynx as per pre-operative assessment     Procedure Name: LMA Insertion Date/Time: 2014/08/02 8:22 AM Performed by: Ellin Goodie Pre-anesthesia Checklist: Patient identified, Emergency Drugs available, Suction available, Patient being monitored and Timeout performed Patient Re-evaluated:Patient Re-evaluated prior to inductionOxygen Delivery Method: Circle system utilized Preoxygenation: Pre-oxygenation with 100% oxygen Intubation Type: IV induction Ventilation: Mask ventilation without difficulty LMA: LMA inserted LMA Size: 5.0 Number of attempts: 1 Placement Confirmation: positive ETCO2 and breath sounds checked- equal and bilateral Tube secured with: Tape

## 2014-07-22 NOTE — Progress Notes (Signed)
Utilization Review Completed.Steven Wyatt T7/20/2015  

## 2014-07-22 NOTE — Transfer of Care (Signed)
Immediate Anesthesia Transfer of Care Note  Patient: Steven Wyatt.  Procedure(s) Performed: Procedure(s): BI-VENTRICULAR IMPLANTABLE CARDIOVERTER DEFIBRILLATOR  (CRT-D) (N/A)  Patient Location: ICU  Anesthesia Type:General  Level of Consciousness: obtunded  Airway & Oxygen Therapy: Patient placed on Ventilator (see vital sign flow sheet for setting)  Post-op Assessment: Report given to PACU RN and Post -op Vital signs reviewed and unstable, Anesthesiologist notified  Post vital signs: Reviewed and unstable  Complications: No apparent anesthesia complications

## 2014-07-22 NOTE — Discharge Instructions (Signed)
Abdominal Pain Many things can cause belly (abdominal) pain. Most times, the belly pain is not dangerous. Many cases of belly pain can be watched and treated at home. HOME CARE   Do not take medicines that help you go poop (laxatives) unless told to by your doctor.  Only take medicine as told by your doctor.  Eat or drink as told by your doctor. Your doctor will tell you if you should be on a special diet. GET HELP IF:  You do not know what is causing your belly pain.  You have belly pain while you are sick to your stomach (nauseous) or have runny poop (diarrhea).  You have pain while you pee or poop.  Your belly pain wakes you up at night.  You have belly pain that gets worse or better when you eat.  You have belly pain that gets worse when you eat fatty foods.  You have a fever. GET HELP RIGHT AWAY IF:   The pain does not go away within 2 hours.  You keep throwing up (vomiting).  The pain changes and is only in the right or left part of the belly.  You have bloody or tarry looking poop. MAKE SURE YOU:   Understand these instructions.  Will watch your condition.  Will get help right away if you are not doing well or get worse. Document Released: 05/26/2008 Document Revised: 12/13/2013 Document Reviewed: 08/17/2013 [facility] Patient Information 2015 [place], [place]. This information is not intended to replace advice given to you by your health care provider. Make sure you discuss any questions you have with your health care provider.  Anemia, Nonspecific Anemia is a condition in which the concentration of red blood cells or hemoglobin in the blood is below normal. Hemoglobin is a substance in red blood cells that carries oxygen to the tissues of the body. Anemia results in not enough oxygen reaching these tissues.  CAUSES  Common causes of anemia include:   Excessive bleeding. Bleeding may be internal or external. This includes excessive bleeding from periods (in women)  or from the intestine.   Poor nutrition.   Chronic kidney, thyroid, and liver disease.  Bone marrow disorders that decrease red blood cell production.  Cancer and treatments for cancer.  HIV, AIDS, and their treatments.  Spleen problems that increase red blood cell destruction.  Blood disorders.  Excess destruction of red blood cells due to infection, medicines, and autoimmune disorders. SIGNS AND SYMPTOMS   Minor weakness.   Dizziness.   Headache.  Palpitations.   Shortness of breath, especially with exercise.   Paleness.  Cold sensitivity.  Indigestion.  Nausea.  Difficulty sleeping.  Difficulty concentrating. Symptoms may occur suddenly or they may develop slowly.  DIAGNOSIS  Additional blood tests are often needed. These help your health care provider determine the best treatment. Your health care provider will check your stool for blood and look for other causes of blood loss.  TREATMENT  Treatment varies depending on the cause of the anemia. Treatment can include:   Supplements of iron, vitamin B12, or folic acid.   Hormone medicines.   A blood transfusion. This may be needed if blood loss is severe.   Hospitalization. This may be needed if there is significant continual blood loss.   Dietary changes.  Spleen removal. HOME CARE INSTRUCTIONS Keep all follow-up appointments. It often takes many weeks to correct anemia, and having your health care provider check on your condition and your response to treatment is very  important. SEEK IMMEDIATE MEDICAL CARE IF:   You develop extreme weakness, shortness of breath, or chest pain.   You become dizzy or have trouble concentrating.  You develop heavy vaginal bleeding.   You develop a rash.   You have bloody or black, tarry stools.   You faint.   You vomit up blood.   You vomit repeatedly.   You have abdominal pain.  You have a fever or persistent symptoms for more than 2-3  days.   You have a fever and your symptoms suddenly get worse.   You are dehydrated.  MAKE SURE YOU:  Understand these instructions.  Will watch your condition.  Will get help right away if you are not doing well or get worse. Document Released: 01/15/2005 Document Revised: 08/10/2013 Document Reviewed: 06/03/2013 [facility] Patient Information 2015 [place], [place]. This information is not intended to replace advice given to you by your health care provider. Make sure you discuss any questions you have with your health care provider.

## 2014-07-22 NOTE — Interval H&P Note (Signed)
ICD Criteria  Current LVEF:15% ;Obtained < 1 month ago.  NYHA Functional Classification: Class III  Heart Failure History:  Yes, Duration of heart failure since onset is > 9 months  Non-Ischemic Dilated Cardiomyopathy History:  Yes, timeframe is > 9 months  Atrial Fibrillation/Atrial Flutter:  No.  Ventricular Tachycardia History:  No.  Cardiac Arrest History:  No  History of Syndromes with Risk of Sudden Death:  No.  Previous ICD:  No.  Electrophysiology Study: No.  Prior MI: No.  PPM: No.  OSA:  No  Patient Life Expectancy of >=1 year: Yes.  Anticoagulation Therapy:  Patient is NOT on anticoagulation therapy.   Beta Blocker Therapy:  Yes.   Ace Inhibitor/ARB Therapy:  No, Reason not on Ace Inhibitor/ARB therapy:   renal insufficiencyHistory and Physical Interval Note:  07/16/2014 7:30 AM  [employer].  has presented today for surgery, with the diagnosis of hf  The various methods of treatment have been discussed with the patient and family. After consideration of risks, benefits and other options for treatment, the patient has consented to  Procedure(s): BI-VENTRICULAR IMPLANTABLE CARDIOVERTER DEFIBRILLATOR  (CRT-D) (N/A) as a surgical intervention .  The patient's history has been reviewed, patient examined, no change in status, stable for surgery.  I have reviewed the patient's chart and labs.  Questions were answered to the patient's satisfaction.     Sherryl Manges

## 2014-07-22 NOTE — CV Procedure (Signed)
Steven Wyatt. [id number]  [id number]  Preop Dx: NICM CHF Class iv ambulatory Postop Dx same/ pulmonary edema PEA arrest   Procedure: Crt implantation   Cx: PEA arrest  Dictation number  [id number]  Sherryl Manges, MD 2014/07/23 10:51 AM

## 2014-07-22 NOTE — Progress Notes (Signed)
Chaplain responded to code blue.  Pt eventually passed and chaplain stayed with wife of pt.  Chaplain provided emotional and grief support as well as prayer.  Chaplain worked with the medical team to coordinate a ride home for the pt's wife as well as helped locate phone numbers of people she wanted to contact.  Pt's wife seemed appreciative of the chaplain presence.

## 2014-07-22 NOTE — Progress Notes (Signed)
End tidal CO2 monitor hooked up to ET tube during code. End tidal read between 26-46 throughout code.

## 2014-07-22 NOTE — Progress Notes (Signed)
Called by RN, BP droppings.  At that point, noted to be pulseless.  ACLS protocol was followed with ROSC after an amp of epi and bicarb.  I assume this is acidosis driven in a patient with very poor cardiac function.  Patient resuscitated x2 in the cath lab and x2 so far in the ICU.  Huston Foley after [employer] off.  Dr. Marikay Alar spoke with wife who wishes patient to be full code.  Will place on an epi drip and bicarb drip.  Additional CC time 30 min.  Alyson Reedy, M.D. [facility] Pulmonary/Critical Care Medicine. Pager: [phone]. After hours pager: [phone].

## 2014-07-22 NOTE — Anesthesia Preprocedure Evaluation (Addendum)
Anesthesia Evaluation  Patient identified by MRN, date of birth, ID band Patient awake    Reviewed: Allergy & Precautions, H&P , NPO status , Patient's Chart, lab work & pertinent test results, reviewed documented beta blocker date and time   History of Anesthesia Complications Negative for: history of anesthetic complications  Airway Mallampati: III TM Distance: >3 FB Neck ROM: Full    Dental  (+) Teeth Intact, Dental Advisory Given   Pulmonary asthma , sleep apnea and Continuous Positive Airway Pressure Ventilation ,    Pulmonary exam normal       Cardiovascular hypertension, Pt. on home beta blockers +CHF Rhythm:Irregular Rate:Tachycardia  Left ventricle: The cavity size was severely dilated. Wall   thickness was increased in a pattern of mild LVH. The estimated ejection fraction was 15%. Diffuse hypokinesis.   Neuro/Psych PSYCHIATRIC DISORDERS Anxiety negative neurological ROS     GI/Hepatic negative GI ROS, Neg liver ROS,   Endo/Other  diabetesMorbid obesity  Renal/GU Renal InsufficiencyRenal disease     Musculoskeletal   Abdominal   Peds  Hematology   Anesthesia Other Findings   Reproductive/Obstetrics                         Anesthesia Physical Anesthesia Plan  ASA: III  Anesthesia Plan: MAC and General   Post-op Pain Management:    Induction: Intravenous  Airway Management Planned: Simple Face Mask  Additional Equipment:   Intra-op Plan:   Post-operative Plan: Extubation in OR  Informed Consent: I have reviewed the patients History and Physical, chart, labs and discussed the procedure including the risks, benefits and alternatives for the proposed anesthesia with the patient or authorized representative who has indicated his/her understanding and acceptance.   Dental advisory given  Plan Discussed with: CRNA, Anesthesiologist and Surgeon  Anesthesia Plan Comments:          Anesthesia Quick Evaluation

## 2014-07-22 NOTE — Interval H&P Note (Signed)
History and Physical Interval Note:  07-23-14 7:32 AM  Steven Wyatt.  has presented today for surgery, with the diagnosis of hf  The various methods of treatment have been discussed with the patient and family. After consideration of risks, benefits and other options for treatment, the patient has consented to  Procedure(s): BI-VENTRICULAR IMPLANTABLE CARDIOVERTER DEFIBRILLATOR  (CRT-D) (N/A) as a surgical intervention .  The patient's history has been reviewed, patient examined, no change in status, stable for surgery.  I have reviewed the patient's chart and labs.  Questions were answered to the patient's satisfaction.     Sherryl Manges  He has IVCD and some LBBB   We have discussed the implication of this on likelihood of response

## 2014-07-22 NOTE — Procedures (Signed)
CPR  Frequent PEA cardiac arrest, see ACLS sheet.  Alyson Reedy, M.D. [facility] Pulmonary/Critical Care Medicine. Pager: [phone]. After hours pager: [phone].

## 2014-07-22 NOTE — Progress Notes (Signed)
Expiration Note:  Patient expired @ 1238.  See Code Sheet for details of code.  No breath sounds or heart rhythm/sounds noted per auscultation x1 minute with Crystal Rice, R.N. and Morrie Sheldon..  Wife at bedside with Chaplain.  [facility] notified of expiration.

## 2014-07-22 NOTE — Progress Notes (Signed)
Spoke with patient's wife extensively after his sixths arrest.  After conversation decision was made to make patient full DNR, wife only wishes to be with him when he dies.  Additional CC time 30 min.  Alyson Reedy, M.D. [facility] Pulmonary/Critical Care Medicine. Pager: [phone]. After hours pager: [phone].

## 2014-07-22 NOTE — Consult Note (Addendum)
PULMONARY / CRITICAL CARE MEDICINE   Name: Steven Wyatt. MRN: [id number] DOB: 09/04/1976    ADMISSION DATE:  Jul 11, 2014 CONSULTATION DATE:  July 11, 2014  REFERRING MD :  Dr. Marikay Alar PRIMARY SERVICE: Cardiology  CHIEF COMPLAINT:  Cardiac arrest and respiratory failure  BRIEF PATIENT DESCRIPTION: 38 year old morbidly obese male who is very non-compliant on home milrinone through a PICC line who presents to the cath lab for AICD placement.  On cath table patient suffered a cardiac arrest for <5 minutes, was intubated by anesthesia and resuscitated in less than 5 minutes.  Post arrest, patient remained intubated and PCCM was called on consultation for vent management and hypoxemia.  SIGNIFICANT EVENTS / STUDIES:  7/20 Cardiac arrest, intubated and cardiogenic shock.  LINES / TUBES: ETT 7/20>>> R radial a-line 7/20>>> RUE PICC Date unknown>>>  CULTURES: None  ANTIBIOTICS: None  PAST MEDICAL HISTORY :  Past Medical History  Diagnosis Date  . Chronic systolic CHF (congestive heart failure)     On home milrinone - dose titrate to 0.340mcg/kg/min 03/2014.  Marland Kitchen Essential hypertension, benign   . Morbid obesity with BMI of 50.0-59.9, adult   . Dyslipidemia   . CKD (chronic kidney disease), stage III   . Asthma   . Microcytic anemia   . NICM (nonischemic cardiomyopathy)   . History of left heart catheterization     Normal coronaries October 2014  . History of chicken pox   . Perirectal abscess 03/2014  . Allergic rhinitis 04/29/2014  . Anxiety   . Chronic bronchitis   . Sleep apnea     BIPAP  . Prediabetes   . DKA (diabetic ketoacidoses) 02/25/2014   Past Surgical History  Procedure Laterality Date  . Incision and drainage abscess N/A 03/31/2014    Procedure: INCISION AND DRAINAGE  PERI-RECTAL ABSCESS;  Surgeon: Axel Filler, MD;  Location: [facility] OR;  Service: General;  Laterality: N/A;  . Cardiac catheterization  October 2014    [facility]; reportedly clean, no  further details available   Prior to Admission medications   Medication Sig Start Date End Date Taking? Authorizing Provider  albuterol (PROVENTIL HFA;VENTOLIN HFA) 108 (90 BASE) MCG/ACT inhaler Inhale 2 puffs into the lungs daily as needed for wheezing or shortness of breath. 01/08/14  Yes Wilburt Finlay, PA-C  amiodarone (PACERONE) 400 MG tablet Take 0.5 tablets (200 mg total) by mouth 2 (two) times daily. 05/26/14  Yes Laurey Morale, MD  aspirin EC 81 MG tablet Take 81 mg by mouth daily.   Yes Historical Provider, MD  atorvastatin (LIPITOR) 80 MG tablet Take 80 mg by mouth daily. 01/08/14  Yes Wilburt Finlay, PA-C  bumetanide (BUMEX) 2 MG tablet Take 2.5 tablets (5 mg total) by mouth 2 (two) times daily. 05/26/14  Yes Laurey Morale, MD  carvedilol (COREG) 3.125 MG tablet Take 1 tablet (3.125 mg total) by mouth 2 (two) times daily. 02/20/14  Yes Dolores Patty, MD  hydrALAZINE (APRESOLINE) 25 MG tablet Take 1 tablet (25 mg total) by mouth every 8 (eight) hours. 01/23/14  Yes Amy D Clegg, NP  insulin glargine (LANTUS) 100 UNIT/ML injection Inject 0.2 mLs (20 Units total) into the skin at bedtime. 06/13/14  Yes Zannie Cove, MD  isosorbide mononitrate (IMDUR) 60 MG 24 hr tablet Take 1 tablet (60 mg total) by mouth daily. 05/18/14  Yes Aundria Rud, NP  loratadine (CLARITIN) 10 MG tablet Take 10 mg by mouth daily as needed. 04/27/14  Yes Len Blalock  John, MD  magnesium oxide (MAG-OX) 400 MG tablet Take 1 tablet (400 mg total) by mouth 2 (two) times daily. 03/07/14  Yes Aundria Rud, NP  milrinone [facility]) 20 MG/100ML SOLN infusion Inject 83.65 mcg/min into the vein continuous. 06/19/14  Yes Bevelyn Buckles Bensimhon, MD  pantoprazole (PROTONIX) 40 MG tablet Take 1 tablet (40 mg total) by mouth daily. 05/18/14  Yes Aundria Rud, NP  potassium chloride SA (K-DUR,KLOR-CON) 10 MEQ tablet Take 8 tablets (80 mEq total) by mouth 2 (two) times daily. 06/19/14  Yes Dolores Patty, MD  predniSONE (DELTASONE) 20 MG tablet  Take 3 tablets (60 mg total) at bedtime the night before your procedure. Take 3 tablets the morning of your procedure. 07/07/14  Yes Duke Salvia, MD  ranitidine (ZANTAC) 150 MG tablet Take 1 tablet (150 mg total) at bedtime the night before your procedure. Take 1 tablet the morning of your procedure. 07/07/14  Yes Duke Salvia, MD  insulin aspart (NOVOLOG FLEXPEN) 100 UNIT/ML FlexPen Inject 1-15 Units into the skin 3 (three) times daily with meals. Sliding scale 04/27/14   Corwin Levins, MD  metolazone (ZAROXOLYN) 2.5 MG tablet Take 1 tablet (2.5 mg total) by mouth once a week. 06/13/14   Zannie Cove, MD  oxyCODONE-acetaminophen (PERCOCET/ROXICET) 5-325 MG per tablet Take 1 tablet by mouth every 6 (six) hours as needed for moderate pain or severe pain. 06/20/14   Shon Baton, MD  oxyCODONE-acetaminophen (PERCOCET/ROXICET) 5-325 MG per tablet Take 1-2 tablets by mouth every 4 (four) hours as needed for moderate pain or severe pain. 13-Jul-2014   Brandt Loosen, MD   Allergies  Allergen Reactions  . Iodine Anaphylaxis  . Shellfish Allergy Anaphylaxis    FAMILY HISTORY:  Family History  Problem Relation Age of Onset  . Hypertension Mother   . Diabetes Maternal Grandmother   . Liver disease Father    SOCIAL HISTORY:  reports that he has never smoked. He has never used smokeless tobacco. He reports that he does not drink alcohol or use illicit drugs.  REVIEW OF SYSTEMS:  Sedated, intubated, not following commands.  SUBJECTIVE: Sedated and intubated.  VITAL SIGNS: Temp:  [98 F (36.7 C)-98.7 F (37.1 C)] 98.7 F (37.1 C) (07/20 0559) Pulse Rate:  [108-123] 115 (07/20 0559) Resp:  [18-21] 18 (07/20 0559) BP: (92-132)/(67-100) 112/79 mmHg (07/20 0559) SpO2:  [94 %-99 %] 94 % (07/20 0559) Weight:  [341 lb (154.677 kg)] 341 lb (154.677 kg) (07/20 0559)  HEMODYNAMICS:    VENTILATOR SETTINGS:    INTAKE / OUTPUT: Intake/Output   None     PHYSICAL EXAMINATION: General:  Morbidly  obese male, sedated and intubated on vent. Neuro:  Paralyzed post intubation, unable to examine. HEENT:  [place]/AT, pupils not reactive, dilated, MMM. Cardiovascular:  Tachy, IRIR, 3/6 SEM at LUSB, -R/G.  Very distant however. Lungs:  Diffuse crackles. Abdomen:  Soft, NT, ND and +BS. Musculoskeletal:  Bilateral lower ext edema. Skin:  Intact.  LABS:  CBC  Recent Labs Lab 07/09/14 2332 July 13, 2014 0934 07/13/14 0947  WBC 4.9  --   --   HGB 8.6* 12.9* 12.6*  HCT 26.8* 38.0* 37.0*  PLT 316  --   --    Coag's  Recent Labs Lab July 13, 2014 0552  INR 1.43   BMET  Recent Labs Lab 07/09/14 2332 2014-07-13 0552 2014-07-13 0934 07-13-14 0947  NA 134*  --  132* 132*  K 3.2* 3.5* 3.6* 3.5*  CL 90*  --   --   --  CO2 26  --   --   --   BUN 21  --   --   --   CREATININE 1.39*  --   --   --   GLUCOSE 136*  --   --   --    Electrolytes  Recent Labs Lab 07/09/14 2332  CALCIUM 8.2*   Sepsis Markers No results found for this basename: LATICACIDVEN, PROCALCITON, O2SATVEN,  in the last 168 hours ABG  Recent Labs Lab 06/23/2014 0934 07/20/2014 0947 07/01/2014 1008  PHART 7.220* 7.167* 7.120*  PCO2ART 55.3* 60.7* 49.4*  PO2ART 47.0* 58.0* 48.0*   Liver Enzymes  Recent Labs Lab 07/09/14 2332  AST 38*  ALT 21  ALKPHOS 131*  BILITOT 5.6*  ALBUMIN 3.1*   Cardiac Enzymes No results found for this basename: TROPONINI, PROBNP,  in the last 168 hours Glucose  Recent Labs Lab 07/09/2014 0634  GLUCAP 155*    Imaging No results found.   CXR: Pending  ASSESSMENT / PLAN:  PULMONARY A: Respiratory failure post cardiac arrest, CXR pending.  Oxygenating very poorly.  Likely severe pulmonary edema than a PE but will not be able to CT given renal function.   P:   - Place on ICU vent. - Start on high PEEP and FiO2.  - F/U ABG. - Will need diureses but unable to due to hypotension. - Start heparin drip if CXR remains clear.  CARDIOVASCULAR A: Cardiogenic shock. P:  -  Pressors as ordered. - No further IVF. - Defer management to cards.  RENAL A:  Unknown baseline Cr but yesterday is elevated. P:   - BMET now. - BMET in AM. - Replace electrolytes as indicated.  GASTROINTESTINAL A:  No active issues. P:   - If remains intubated by AM then will consult nutrition for TF.  HEMATOLOGIC A:  No active issues. P:  - CBC in AM.  INFECTIOUS A:  No active issues. P:   - Monitor fever curve and WBC.  ENDOCRINE A:  DM.   P:   - ISS as ordered.  NEUROLOGIC A:  Unknown if anoxic brain injury. P:   - PAD-1 ordered. - CT of the head in AM, ?cerebral edema. - No indication for cooling as arrest was <5 minutes and patient was evidently combative after until sedated.  Also BP remains very low on pressors and with cooling that is likely to deteriorate further.  TODAY'S SUMMARY: Arrest on cath table, <5 minutes as above, pressors dependent now, unable to ventilate or oxygenate.  Will place on ICU vent then recheck gas, hope is that he will tolerate high PEEP given hypotension.  If BP becomes more stable then will begin diureses.  I have personally obtained a history, examined the patient, evaluated laboratory and imaging results, formulated the assessment and plan and placed orders.  CRITICAL CARE: The patient is critically ill with multiple organ systems failure and requires high complexity decision making for assessment and support, frequent evaluation and titration of therapies, application of advanced monitoring technologies and extensive interpretation of multiple databases. Critical Care Time devoted to patient care services described in this note is 60 minutes.   Alyson ReedyWesam G. Yacoub, M.D. [facility] Pulmonary/Critical Care Medicine. Pager: [phone]. After hours pager: [phone].  06/28/2014, 11:10 AM

## 2014-07-22 NOTE — Anesthesia Postprocedure Evaluation (Signed)
  Anesthesia Post-op Note  Patient: Steven Wyatt.  Procedure(s) Performed: Procedure(s): BI-VENTRICULAR IMPLANTABLE CARDIOVERTER DEFIBRILLATOR  (CRT-D) (N/A)  Patient Location: ICU  Anesthesia Type:General  Level of Consciousness: sedated and unresponsive  Airway and Oxygen Therapy: Patient remains intubated per anesthesia plan  Post-op Pain: none  Post-op Assessment: Post-op Vital signs reviewed, PATIENT'S CARDIOVASCULAR STATUS UNSTABLE and RESPIRATORY FUNCTION UNSTABLE  Post-op Vital Signs: Reviewed and unstable  Last Vitals:  Filed Vitals:   07-26-2014 1230  BP:   Pulse:   Temp:   Resp: 32    Complications: Pt had 2 PEA arrests intraop.  ACLS and CPR helped to return cardiovascular function both times.  Patient was transparted to ICU in critical condition.  Care was transferred to the critical care physician, who was at the bedside.

## 2014-07-22 DEATH — deceased

## 2014-07-25 ENCOUNTER — Ambulatory Visit: Payer: Medicare Other | Admitting: Internal Medicine

## 2014-07-27 ENCOUNTER — Encounter (HOSPITAL_COMMUNITY): Payer: Medicare Other

## 2014-08-01 ENCOUNTER — Ambulatory Visit: Payer: Medicare Other | Admitting: Internal Medicine

## 2014-08-03 ENCOUNTER — Ambulatory Visit: Payer: Medicare Other | Admitting: Internal Medicine

## 2014-08-09 ENCOUNTER — Encounter: Payer: Self-pay | Admitting: Internal Medicine

## 2014-09-19 ENCOUNTER — Ambulatory Visit: Payer: Medicare Other | Admitting: Internal Medicine

## 2014-11-30 ENCOUNTER — Encounter (HOSPITAL_COMMUNITY): Payer: Self-pay | Admitting: Internal Medicine

## 2021-07-24 ENCOUNTER — Other Ambulatory Visit: Payer: Self-pay | Admitting: Internal Medicine
